# Patient Record
Sex: Female | Born: 1980 | Race: White | Hispanic: No | Marital: Married | Smoking: Never smoker
Health system: Southern US, Community
[De-identification: ages and names within clinical notes are randomized; demographics above are authoritative.]

## PROBLEM LIST (undated history)

## (undated) VITALS — BP 122/87 | HR 80 | Temp 98.4°F | Resp 18 | Ht 59.0 in | Wt 210.0 lb

## (undated) DIAGNOSIS — K219 Gastro-esophageal reflux disease without esophagitis: Secondary | ICD-10-CM

## (undated) DIAGNOSIS — N898 Other specified noninflammatory disorders of vagina: Principal | ICD-10-CM

## (undated) DIAGNOSIS — R350 Frequency of micturition: Principal | ICD-10-CM

## (undated) DIAGNOSIS — I10 Essential (primary) hypertension: Secondary | ICD-10-CM

## (undated) DIAGNOSIS — B9689 Other specified bacterial agents as the cause of diseases classified elsewhere: Secondary | ICD-10-CM

## (undated) DIAGNOSIS — IMO0002 Reserved for concepts with insufficient information to code with codable children: Secondary | ICD-10-CM

## (undated) DIAGNOSIS — M329 Systemic lupus erythematosus, unspecified: Secondary | ICD-10-CM

## (undated) DIAGNOSIS — R51 Headache: Secondary | ICD-10-CM

## (undated) DIAGNOSIS — B379 Candidiasis, unspecified: Secondary | ICD-10-CM

## (undated) DIAGNOSIS — T7840XA Allergy, unspecified, initial encounter: Secondary | ICD-10-CM

## (undated) DIAGNOSIS — F319 Bipolar disorder, unspecified: Secondary | ICD-10-CM

## (undated) DIAGNOSIS — K5792 Diverticulitis of intestine, part unspecified, without perforation or abscess without bleeding: Secondary | ICD-10-CM

## (undated) DIAGNOSIS — E669 Obesity, unspecified: Secondary | ICD-10-CM

## (undated) DIAGNOSIS — M545 Low back pain: Secondary | ICD-10-CM

## (undated) DIAGNOSIS — D649 Anemia, unspecified: Secondary | ICD-10-CM

## (undated) DIAGNOSIS — N760 Acute vaginitis: Secondary | ICD-10-CM

## (undated) DIAGNOSIS — F32A Depression, unspecified: Secondary | ICD-10-CM

## (undated) DIAGNOSIS — F419 Anxiety disorder, unspecified: Secondary | ICD-10-CM

## (undated) DIAGNOSIS — G9600 Cerebrospinal fluid leak, unspecified: Secondary | ICD-10-CM

## (undated) DIAGNOSIS — F329 Major depressive disorder, single episode, unspecified: Secondary | ICD-10-CM

## (undated) HISTORY — DX: Acute vaginitis: N76.0

## (undated) HISTORY — DX: Bipolar disorder, unspecified: F31.9

## (undated) HISTORY — DX: Other specified noninflammatory disorders of vagina: N89.8

## (undated) HISTORY — PX: TUBAL LIGATION: SHX77

## (undated) HISTORY — DX: Reserved for concepts with insufficient information to code with codable children: IMO0002

## (undated) HISTORY — PX: RHINOPLASTY: SUR1284

## (undated) HISTORY — DX: Candidiasis, unspecified: B37.9

## (undated) HISTORY — DX: Depression, unspecified: F32.A

## (undated) HISTORY — DX: Headache: R51

## (undated) HISTORY — DX: Cerebrospinal fluid leak: G96.0

## (undated) HISTORY — DX: Obesity, unspecified: E66.9

## (undated) HISTORY — DX: Other specified bacterial agents as the cause of diseases classified elsewhere: B96.89

## (undated) HISTORY — DX: Anemia, unspecified: D64.9

## (undated) HISTORY — DX: Low back pain: M54.5

## (undated) HISTORY — DX: Essential (primary) hypertension: I10

## (undated) HISTORY — DX: Systemic lupus erythematosus, unspecified: M32.9

## (undated) HISTORY — DX: Frequency of micturition: R35.0

## (undated) HISTORY — DX: Major depressive disorder, single episode, unspecified: F32.9

## (undated) HISTORY — DX: Gastro-esophageal reflux disease without esophagitis: K21.9

## (undated) HISTORY — DX: Allergy, unspecified, initial encounter: T78.40XA

## (undated) HISTORY — DX: Cerebrospinal fluid leak, unspecified: G96.00

## (undated) HISTORY — DX: Anxiety disorder, unspecified: F41.9

---

## 2012-01-27 ENCOUNTER — Ambulatory Visit (INDEPENDENT_AMBULATORY_CARE_PROVIDER_SITE_OTHER): Payer: 59 | Admitting: Psychiatry

## 2012-01-27 ENCOUNTER — Encounter (HOSPITAL_COMMUNITY): Payer: Self-pay | Admitting: Psychiatry

## 2012-01-27 VITALS — Wt 215.0 lb

## 2012-01-27 DIAGNOSIS — F4310 Post-traumatic stress disorder, unspecified: Secondary | ICD-10-CM

## 2012-01-27 DIAGNOSIS — F319 Bipolar disorder, unspecified: Secondary | ICD-10-CM

## 2012-01-27 LAB — CBC WITH DIFFERENTIAL/PLATELET
Basophils Absolute: 0 10*3/uL (ref 0.0–0.1)
Basophils Relative: 0 % (ref 0–1)
Eosinophils Absolute: 0.1 10*3/uL (ref 0.0–0.7)
Eosinophils Relative: 1 % (ref 0–5)
HCT: 36.6 % (ref 36.0–46.0)
Hemoglobin: 11.9 g/dL — ABNORMAL LOW (ref 12.0–15.0)
Lymphocytes Relative: 30 % (ref 12–46)
Lymphs Abs: 1.8 10*3/uL (ref 0.7–4.0)
MCH: 28.5 pg (ref 26.0–34.0)
MCHC: 32.5 g/dL (ref 30.0–36.0)
MCV: 87.8 fL (ref 78.0–100.0)
Monocytes Absolute: 0.6 10*3/uL (ref 0.1–1.0)
Monocytes Relative: 10 % (ref 3–12)
Neutro Abs: 3.7 10*3/uL (ref 1.7–7.7)
Neutrophils Relative %: 59 % (ref 43–77)
Platelets: 229 10*3/uL (ref 150–400)
RBC: 4.17 MIL/uL (ref 3.87–5.11)
RDW: 14 % (ref 11.5–15.5)
WBC: 6.2 10*3/uL (ref 4.0–10.5)

## 2012-01-27 LAB — COMPREHENSIVE METABOLIC PANEL
CO2: 25 mEq/L (ref 19–32)
Creat: 0.66 mg/dL (ref 0.50–1.10)
Glucose, Bld: 83 mg/dL (ref 70–99)
Total Bilirubin: 0.5 mg/dL (ref 0.3–1.2)
Total Protein: 6.8 g/dL (ref 6.0–8.3)

## 2012-01-27 MED ORDER — ALPRAZOLAM 0.5 MG PO TABS: 0.5000 mg | ORAL_TABLET | ORAL | Status: DC | PRN

## 2012-01-27 MED ORDER — LAMOTRIGINE 25 MG PO TABS: ORAL_TABLET | ORAL | Status: DC
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
## 2012-01-31 ENCOUNTER — Ambulatory Visit (HOSPITAL_COMMUNITY): Payer: Self-pay | Admitting: Psychiatry

## 2012-02-22 ENCOUNTER — Ambulatory Visit (HOSPITAL_COMMUNITY): Payer: Self-pay | Admitting: Psychiatry

## 2012-03-02 ENCOUNTER — Ambulatory Visit (INDEPENDENT_AMBULATORY_CARE_PROVIDER_SITE_OTHER): Payer: 59 | Admitting: Psychiatry

## 2012-03-02 ENCOUNTER — Encounter (HOSPITAL_COMMUNITY): Payer: Self-pay | Admitting: Psychiatry

## 2012-03-02 VITALS — Wt 216.0 lb

## 2012-03-02 DIAGNOSIS — F4310 Post-traumatic stress disorder, unspecified: Secondary | ICD-10-CM

## 2012-03-02 DIAGNOSIS — F319 Bipolar disorder, unspecified: Secondary | ICD-10-CM

## 2012-03-02 MED ORDER — LAMOTRIGINE 100 MG PO TABS: 100.0000 mg | ORAL_TABLET | Freq: Every day | ORAL | Status: DC

## 2012-03-02 NOTE — Progress Notes (Signed)
Chief complaint I still feel depressed and having mood swings.  History of present illness Patient's 31 year old Caucasian separated employed female who came for her followup appointment.  Patient was seen I am here to establish my care. I was taking medication for my psychiatrist and recently moved to this area.  History of present illness. Patient is 31 year old Caucasian recently separated home and who is self-referred for seeking treatment. The patient was seeing on March 31 for the first time.  She was started on Lamictal .  She is taking the Lamictal as prescribed and not taking 50 mg.  She denies any side effects including any rash or itching.  Patient continued to endorse significant psychosocial stressors.  Her mother has been sick and hospitalize , she had high cardiac enzymes .  Patient is very concerned about her mother who has multiple health issues including dialysis .  She's also very concerned about her job .  She started orientation however she feels overwhelmed and stressed out.  She continued to endorse difficult relationship with her husband who she has been separated recently.  Patient does not get any support from him.  She's living by herself.  Patient admitted some mood swings anger and agitation however she is sleeping better.  She likes Lamictal as she is tolerating the medication very well.  She also takes Xanax only as needed.  She is sleeping better.  However she endorse irritable mood and some crying spells.  She denies any hallucination or any active or passive suicidal thoughts.  She denies any recent self abusive behavior.  She was scheduled to see therapist however she did not come for the appointment .  She is looking for a therapist in [place] where she lives.   She had blood work done and I review the results which shows high hemoglobin A1c.  Patient has family history of high blood sugar.  Her LFT and kidney tests are normal. Patient also read about bipolar disorder and  believe she has some symptoms of mood lability and impulsive behavior.   Current psychiatric medication. Xanax 0.5 mg as needed. Lamictal 25 mg 2 tablet daily  Past psychiatric history. Patient has history of significant depression and mood swings. She has at least 3 psychiatric admissions in 2000-2003 due to cutting herself and suicidal thinking. In the past. She had tried Geodon, Depakote, Lamictal, Prozac, Abilify, Lexapro, Celexa, Effexor, Paxil, Risperdal, Ativan and Klonopin. She admitted history of anger, severe mood swings, and irritability.  Psychosocial history. Patient was born and lived in [place]. She's been married twice. She has one son from her previous marriage and a daughter from her second marriage. She lives by herself. She has supportive parents who live in [place]. She is recently separated from her husband due to failure of reconciliation. She started her job however she isn't orientation phase.  She is working at [employer].  Medical history. Patient has obesity, and chronic migraine headache. She does not have any recent blood work. She is taking Imitrex as needed for headache.  Her recent work showed hemoglobin A1c is 6 however her liver enzymes and kidney functions are normal.  Family history. Patient endorsed significant history of depression anxiety in her family. Her brother, sister and father has depression and anxiety.  Education background and work history. 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   Alcohol and substance  use history. Patient denies any history of alcohol or substance use. She denies a history of DWI.  Mental status examination. Patient is a obese, female, who is casually dressed. She appearstense, anxious, but maintained a fair eye contact.  She described her mood is tense, depressed and sad and her  affect is mood congruent.  She is relevant in conversation. She denies any active or passive suicidal thinking and homicidal thinking. She denies any auditory or visual hallucination. She denies any tremors or shakes. Her speech is soft but clear and coherent. Her thought process is logical linear and goal-directed. There were no psychotic symptoms present at this time. There were no delusion obsession or psychosis present. She's alert and oriented x3. Her insight judgment and impulse control is fair.  Assessment. Axis I mood disorder NOS, bipolar disorder 1, posttraumatic stress disorder, r/o Major depressive disorder Axis II rule out borderline personality trait. Axis III obesity, chronic headache. Axis IV moderate. Axis V 60-65.  Plan. I discussed for blood results with her.  I encourage her to see her primary care physician for further workup or if she need any medication to lower her blood sugar.  Patient has family history of hyperglycemia.  Patient has appointment next week to see primary care physician.  I have given copies of the blood results.  At this time patient is tolerating her Lamictal however she continued to have mood lability agitation and depression.  I will increase Lamictal 200 mg.  She still has Xanax which she takes only as needed .  I also encouraged her to see therapist for coping and social skills.  We discussed safety plan that in any case if she feels worsening of her symptoms or any time having suicidal thoughts or homicidal thoughts continue to call 911 or go to local emergency room.  I will see her again in 3 weeks.  I also discussed to do regular walking and monitor closely her calorie intake and diet.  Time spent 30 minutes.

## 2012-03-08 ENCOUNTER — Ambulatory Visit (INDEPENDENT_AMBULATORY_CARE_PROVIDER_SITE_OTHER): Payer: 59 | Admitting: Psychiatry

## 2012-03-08 ENCOUNTER — Encounter (HOSPITAL_COMMUNITY): Payer: Self-pay | Admitting: Psychiatry

## 2012-03-08 DIAGNOSIS — F4310 Post-traumatic stress disorder, unspecified: Secondary | ICD-10-CM

## 2012-03-08 DIAGNOSIS — F319 Bipolar disorder, unspecified: Secondary | ICD-10-CM
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
## 2012-03-08 NOTE — Patient Instructions (Signed)
Discussed orally 

## 2012-03-15 ENCOUNTER — Ambulatory Visit (INDEPENDENT_AMBULATORY_CARE_PROVIDER_SITE_OTHER): Payer: 59 | Admitting: Psychiatry

## 2012-03-15 DIAGNOSIS — F319 Bipolar disorder, unspecified: Secondary | ICD-10-CM

## 2012-03-15 DIAGNOSIS — F4310 Post-traumatic stress disorder, unspecified: Secondary | ICD-10-CM

## 2012-03-17 NOTE — Patient Instructions (Signed)
Discussed orally 
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
## 2012-03-22 ENCOUNTER — Ambulatory Visit (HOSPITAL_COMMUNITY): Payer: Self-pay | Admitting: Psychiatry

## 2012-03-23 ENCOUNTER — Encounter (HOSPITAL_COMMUNITY): Payer: Self-pay | Admitting: Psychiatry

## 2012-03-23 ENCOUNTER — Ambulatory Visit (INDEPENDENT_AMBULATORY_CARE_PROVIDER_SITE_OTHER): Payer: 59 | Admitting: Psychiatry

## 2012-03-23 VITALS — Wt 217.0 lb

## 2012-03-23 DIAGNOSIS — F4310 Post-traumatic stress disorder, unspecified: Secondary | ICD-10-CM

## 2012-03-23 DIAGNOSIS — F319 Bipolar disorder, unspecified: Secondary | ICD-10-CM

## 2012-03-23 MED ORDER — ZIPRASIDONE HCL 40 MG PO CAPS: 40.0000 mg | ORAL_CAPSULE | Freq: Every day | ORAL | Status: DC

## 2012-03-23 MED ORDER — LAMOTRIGINE 150 MG PO TABS: 150.0000 mg | ORAL_TABLET | Freq: Every day | ORAL | Status: DC

## 2012-03-23 MED ORDER — ALPRAZOLAM 0.5 MG PO TABS: 0.5000 mg | ORAL_TABLET | Freq: Two times a day (BID) | ORAL | Status: DC

## 2012-03-23 NOTE — Progress Notes (Signed)
Chief complaint I feel paranoia and agitation.    History of present illness Patient is 31 year old Caucasian separated female who came for her followup appointment.  Patient continued to endorse significant anxiety depression and flashback .  She started working however she feel very paranoid and distressed about her chart.  She endorse not comfortable around people and believed people are talking about her.  She admitted having anxiety and panic attack in past week.  She also endorse self abusive behavior by cutting superficially on her leg with lancets however that did not require any treatment or intervention.  Patient admitted under a lot of the stress and recent weeks.  She has limited support from her husband who she recently separated.  Her mother remains very sick and she has a lot of health issues.  Patient admitted getting easily irritable angry and having some mood swings.  She also endorse crying spells , social isolation and anhedonia.  Her she denies any active or passive suicidal thinking but endorse feeling of hopelessness or helplessness.  She's compliant with her medication and reported no side effects.  She remembered taking higher dose of Lamictal in the past.  She is using Xanax at night time for sleep however she felt more anxiety during the day .  She's wondering if she can take Xanax during the day.  She's not drinking or using any illegal substance.  She never ask early refill of her Xanax.   She started seeing therapist in this office for coping skills however she has seen only one time.  She is scheduled to see her primary care physician on June 4 for further workup.  Her daughter shows high hemoglobin A1c.  Current psychiatric medication. Xanax 0.5 mg as needed. Lamictal 100 mg tablet daily  Past psychiatric history. Patient has history of significant depression and mood swings. She has at least 3 psychiatric admissions in 2000-2003 due to cutting herself and suicidal  thinking. In the past. She had tried Geodon, Depakote, Lamictal, Prozac, Abilify, Lexapro, Celexa, Effexor, Paxil, Risperdal, Ativan and Klonopin. She admitted history of anger, severe mood swings, and irritability.  Psychosocial history. Patient was born and lived in [place]. She's been married twice. She has one son from her previous marriage and a daughter from her second marriage. She lives by herself. She has supportive parents who live in [place]. She is recently separated from her husband due to failure of reconciliation. She started her job however she isn't orientation phase.  She is working at [employer].  Medical history. Patient has obesity, and chronic migraine headache. She does not have any recent blood work. She is taking Imitrex as needed for headache.  Her recent work showed hemoglobin A1c is 6 however her liver enzymes and kidney functions are normal.  She is scheduled to see [facility] on June 4.  Family history. Patient endorsed significant history of depression anxiety in her family. Her brother, sister and father has depression and anxiety.  Education background and work history. 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   Alcohol and substance use history. Patient denies any history of alcohol or substance use. She denies a history of DWI.  Mental status examination. Patient is a obese, female, who is casually dressed. She appears tense, anxious, but maintained a fair eye contact.  She described her mood is tense, depressed and sad  and her affect is mood congruent.  She is tearful but relevant in conversation. She denies any active or passive suicidal thinking and homicidal thinking. She denies any auditory or visual hallucination. She denies any tremors or shakes. Her speech is soft but clear and coherent. Her thought  process is logical linear and goal-directed.  She endorse paranoia and believes sometimes people talking about her .  She's alert and oriented x3. Her insight judgment and impulse control is fair.  Assessment. Axis I mood disorder NOS, bipolar disorder 1, posttraumatic stress disorder, r/o Major depressive disorder Axis II rule out borderline personality trait. Axis III obesity, chronic headache. Axis IV moderate. Axis V 60-65.  Plan. I I discussed in detail about her symptoms.  I do believe patient is decompensating slowly.  She will require antipsychotic medication to target the paranoid thinking.  She remembered taking Geodon which cause excessive sedation but she also believed she was taking a very high dose.  She is willing to take 40 mg of Geodon at night to target dose paranoid thinking .  I also increase her Xanax 0.5 mg twice a day to target her residual anxiety.  At this time patient does not want any antidepressant however we will consider adding Wellbutrin which she has not taken in the past if she continues to show anxiety symptoms.  I encourage her to see therapist regularly for coping and social skills.  I also talk about self abusive behavior which  Need and her therapy  to be addressed in therapy.  I explained that she should talk to therapist for other coping skills.  I recommend to call us if she feels worsening of the symptoms or if she having any time suicidal thoughts or homicidal thoughts.  I will see her again in 10 days.  Time spent 30 minutes.  She will see her primary care physician on June 4.  Her hemoglobin A1c was 6.0.  She has gained 2 pounds from the last visit.  She's also concerned about her continue weight gain.

## 2012-03-30 ENCOUNTER — Ambulatory Visit (INDEPENDENT_AMBULATORY_CARE_PROVIDER_SITE_OTHER): Payer: 59 | Admitting: Psychiatry

## 2012-03-30 ENCOUNTER — Telehealth (HOSPITAL_COMMUNITY): Payer: Self-pay | Admitting: *Deleted

## 2012-03-30 ENCOUNTER — Encounter (HOSPITAL_COMMUNITY): Payer: Self-pay | Admitting: Psychiatry

## 2012-03-30 VITALS — Wt 217.0 lb

## 2012-03-30 DIAGNOSIS — F319 Bipolar disorder, unspecified: Secondary | ICD-10-CM

## 2012-03-30 DIAGNOSIS — F4310 Post-traumatic stress disorder, unspecified: Secondary | ICD-10-CM

## 2012-03-30 MED ORDER — ZIPRASIDONE HCL 20 MG PO CAPS: 20.0000 mg | ORAL_CAPSULE | Freq: Every day | ORAL | Status: DC

## 2012-03-30 MED ORDER — ZIPRASIDONE HCL 20 MG PO CAPS: ORAL_CAPSULE | ORAL | Status: DC

## 2012-03-30 MED ORDER — LAMOTRIGINE 200 MG PO TABS: 200.0000 mg | ORAL_TABLET | Freq: Every day | ORAL | Status: DC

## 2012-03-30 NOTE — Progress Notes (Signed)
Addended by: Kathryne Sharper T on: 03/30/2012 02:33 PM   Modules accepted: Orders

## 2012-03-30 NOTE — Progress Notes (Addendum)
Chief complaint I feel remain anxious and paranoid.      History of present illness Patient is 31 year old Caucasian separated female who came for her followup appointment.  On her last appointment we started Geodon to target the psychosis and paranoia.  Patient is tolerating Geodon 40 mg however she continues to have paranoid thinking and hallucination.  She feel very anxious and nervous.  She admitted having crying spells and poor sleep .  She had decided to quit her job as she cannot stay focused .  She admitted that she feels sometimes people are talking about her .  She also endorse can easily irritable angry and having mood swing.  She admitted continue to have self abusive behavior .  She had cut her leg one-time in past 2 weeks however she does not want to show her cutting to other people.  There was no injury or bleeding.  Patient denies any active suicidal thoughts but endorse social isolation, detachment and withdrawn.  She continues to have other stress in her life.  Her mother is still sick and she has limited support from her relatives.  She was to try higher dose of Geodon and she also interested to increase her Lamictal.  In the past she had tried multiple [name] arise antidepressants however she had opposite reaction with these medication.  She denies any violence or aggressively he may. She's not drinking or using any illegal substance.  She never ask early refill of her Xanax.  She started seeing therapist in this office for coping skills however she has seen only one time.  She is scheduled to see her primary care physician on June 4 for further workup.  Her daughter shows high hemoglobin A1c and she is concerned about her.  Patient weight remained stable.  Current psychiatric medication. Xanax 0.5 mg as needed. Lamictal 100 mg tablet daily Geodon 40 mg at bedtime.  Past psychiatric history. Patient has history of significant depression and mood swings. She has at least 3 psychiatric  admissions in 2000-2003 due to cutting herself and suicidal thinking. In the past. She had tried Geodon, Depakote, Lamictal, Prozac, Abilify, Lexapro, Celexa, Effexor, Paxil, Risperdal, Ativan and Klonopin. She admitted history of anger, severe mood swings, and irritability.  Psychosocial history. Patient was born and lived in [place]. She's been married twice. She has one son from her previous marriage and a daughter from her second marriage. She lives by herself. She has supportive parents who live in [place]. She is recently separated from her husband due to failure of reconciliation. She started her job however she isn't orientation phase.  She is working at [employer].  Medical history. Patient has obesity, and chronic migraine headache. She does not have any recent blood work. She is taking Imitrex as needed for headache.  Her recent work showed hemoglobin A1c is 6 however her liver enzymes and kidney functions are normal.  She is scheduled to see [facility] on June 4.  Family history. Patient endorsed significant history of depression anxiety in her family. Her brother, sister and father has depression and anxiety.  Education background and work history. 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   Alcohol and substance use history. Patient denies any history of alcohol or substance use. She denies a history of DWI.  Mental status examination. Patient is a obese, female, who is  casually dressed. She appears tense, anxious, but maintained a fair eye contact.  She described her mood is tense, depressed and sad and her affect is mood congruent.  She is tearful but relevant in conversation. She denies any active or passive suicidal thinking and homicidal thinking. She endorse paranoia and auditory hallucination .  She admitted that sometimes she  hears voices .  She denies any tremors or shakes. Her speech is soft but clear and coherent. Her thought process is logical linear and goal-directed.  She endorse paranoia and believes sometimes people talking about her .  She's alert and oriented x3. Her insight judgment and impulse control is fair.  Assessment. Axis I mood disorder NOS, bipolar disorder 1, posttraumatic stress disorder, r/o Major depressive disorder Axis II rule out borderline personality trait. Axis III obesity, chronic headache. Axis IV moderate. Axis V 60-65.  Plan. I will increase her Geodon to 60 mg and Lamictal to 200 mg.  Patient is tolerating her Lamictal without any side effects.  I also talk about voluntary psychiatric inpatient treatment however patient refused.  Patient does not have criteria for involuntary admission.  I discussed in detail the risks and benefits of medication and reassurance given.  I do believe patient at this time does not able to keep her work.  She realizes that once her psychiatric illness gets better she will consider and rethink to apply again. Patient is scheduled to see therapist on June 4.  I recommend to call us if she feels worsening of her symptoms or any time having active suicidal thoughts.  I will see her again in 2 weeks.  Time spent 30 minutes.

## 2012-03-30 NOTE — Progress Notes (Signed)
Addended by: Kathryne Sharper T on: 03/30/2012 02:01 PM   Modules accepted: Orders

## 2012-04-10 ENCOUNTER — Ambulatory Visit (INDEPENDENT_AMBULATORY_CARE_PROVIDER_SITE_OTHER): Payer: 59 | Admitting: Psychiatry

## 2012-04-10 ENCOUNTER — Telehealth (HOSPITAL_COMMUNITY): Payer: Self-pay | Admitting: *Deleted

## 2012-04-10 DIAGNOSIS — F319 Bipolar disorder, unspecified: Secondary | ICD-10-CM
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
## 2012-04-10 NOTE — Patient Instructions (Signed)
Discussed orally 

## 2012-04-11 ENCOUNTER — Other Ambulatory Visit: Payer: Self-pay | Admitting: Adult Health

## 2012-04-11 ENCOUNTER — Other Ambulatory Visit (HOSPITAL_COMMUNITY)
Admission: RE | Admit: 2012-04-11 | Discharge: 2012-04-11 | Disposition: A | Discharge status: Home / Self Care | Payer: 59 | Source: Ambulatory Visit | Attending: Obstetrics and Gynecology | Admitting: Obstetrics and Gynecology

## 2012-04-11 DIAGNOSIS — Z1159 Encounter for screening for other viral diseases: Secondary | ICD-10-CM | POA: Insufficient documentation

## 2012-04-11 DIAGNOSIS — Z113 Encounter for screening for infections with a predominantly sexual mode of transmission: Secondary | ICD-10-CM | POA: Insufficient documentation

## 2012-04-11 DIAGNOSIS — Z01419 Encounter for gynecological examination (general) (routine) without abnormal findings: Secondary | ICD-10-CM | POA: Insufficient documentation

## 2012-04-13 NOTE — Telephone Encounter (Signed)
We will send our records once we have consent.

## 2012-04-18 ENCOUNTER — Encounter (HOSPITAL_COMMUNITY): Payer: Self-pay | Admitting: Psychiatry

## 2012-04-18 ENCOUNTER — Ambulatory Visit (INDEPENDENT_AMBULATORY_CARE_PROVIDER_SITE_OTHER): Payer: 59 | Admitting: Psychiatry

## 2012-04-18 DIAGNOSIS — F319 Bipolar disorder, unspecified: Secondary | ICD-10-CM

## 2012-04-18 DIAGNOSIS — F4310 Post-traumatic stress disorder, unspecified: Secondary | ICD-10-CM

## 2012-04-18 MED ORDER — ESCITALOPRAM OXALATE 10 MG PO TABS: ORAL_TABLET | ORAL | Status: DC

## 2012-04-18 MED ORDER — LAMOTRIGINE 200 MG PO TABS: 200.0000 mg | ORAL_TABLET | Freq: Every day | ORAL | Status: DC

## 2012-04-18 MED ORDER — ZIPRASIDONE HCL 80 MG PO CAPS: 80.0000 mg | ORAL_CAPSULE | Freq: Every day | ORAL | Status: DC

## 2012-04-18 MED ORDER — ALPRAZOLAM 0.5 MG PO TABS: 0.5000 mg | ORAL_TABLET | Freq: Two times a day (BID) | ORAL | Status: DC

## 2012-04-18 NOTE — Progress Notes (Signed)
Chief complaint I don't see any improvement.  I remain anxious and paranoid.      History of present illness Patient is 31 year old Caucasian separated female who came for her followup appointment.  On her last appointment we increase Geodon to 60 mg.  She felt some improvement in her paranoia however she continued to endorse anxiety symptoms and paranoid thinking.  Patient admitted increased distress in her daily life.  Her mother requires dialysis and multiple Dr. appointment .  Patient admitted recently more crying spells social isolation and decreased energy do anything.  She also noticed poor sleep despite increasing Geodon.  Patient is unable to understand why she cannot sleep a Geodon as in the past she has mentioned that 60 mg of Geodon was too much and give very good sleep.  She admitted having crying spells social isolation and racing thoughts.  She continued to endorse that sometime she feels people are talking about her.  She also endorse easily irritable angry and having mood swing.  She admitted continue to engage in self abusive behavior .  She had cut her leg one-time with a small pin in past 2 weeks while taking shower however she does not bleed and marks were superficial.   Patient denies any active suicidal thoughts but endorse social isolation, detachment and withdrawn.  She consider herself failure since she cannot keep her job at [employer] and decided to resign.  She's not drinking or using any illegal substance.    Current psychiatric medication. Xanax 0.5 mg as needed. Lamictal 100 mg tablet daily Geodon 60 mg at bedtime.  Past psychiatric history. Patient has history of significant depression and mood swings. She has at least 3 psychiatric admissions in 2000-2003 due to cutting herself and suicidal thinking. In the past. She had tried Geodon, Depakote, Lamictal, Prozac, Abilify, Lexapro, Celexa, Effexor, Paxil, Risperdal, Ativan and Klonopin. She admitted history of anger, severe  mood swings, and irritability.  Psychosocial history. Patient was born and lived in [place]. She's been married twice. She has one son from her previous marriage and a daughter from her second marriage. She lives by herself. She has supportive parents who live in [place]. She is recently separated from her husband due to failure of reconciliation. She started her job however she isn't orientation phase.  She is working at [employer].  Medical history. Patient has obesity, and chronic migraine headache. She does not have any recent blood work. She is taking Imitrex as needed for headache.  Her recent work showed hemoglobin A1c is 6 however her liver enzymes and kidney functions are normal.  She is scheduled to see [facility] on June 4.  Family history. Patient endorsed significant history of depression anxiety in her family. Her brother, sister and father has depression and anxiety.  Education background and work history. 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   Alcohol and substance use history. Patient denies any history of alcohol or substance use. She denies a history of DWI.  Mental status examination. Patient is a obese, female, who is casually dressed. She appears tense, anxious, but maintained a fair eye contact.  She described her mood is tense, depressed and sad and her affect is mood congruent.  She is tearful but relevant in conversation. She denies any active or passive suicidal thinking and homicidal thinking. She endorse paranoia and auditory hallucination .  There were no flight of ideas or loose association .  She's alert and oriented x3. Her insight judgment and impulse control is fair.  Assessment. Axis I mood disorder NOS, bipolar disorder 1, posttraumatic stress disorder, r/o Major depressive disorder Axis II rule out  borderline personality trait. Axis III obesity, chronic headache. Axis IV moderate. Axis V 60-65.  Plan. I do believe patient has some depressive symptoms and require antidepressant..  She has been diagnosed in the past with borderline personality disorder and posttraumatic disorder.  I recommend to try a small dose Lexapro which she had tried only for 3 weeks in the past and do not remember the response.  I will also increase the Geodon to 80 mg.  We also talked about lithium however patient do not remember taking lithium in the past.  We will defer lithium for now.  I also recommend to consider DBT her borderline disorder.  Patient will discuss with her therapist for DBT treatment .  One more time we discuss about the safety plan that anytime if she having active suicidal thoughts or homicidal thoughts and she need to call 911 or go to local emergency room.  I will see her again in 2-3 weeks.  Time spent 30 minutes.    Portion of this note is generated with voice recognition software and may contain typographical error.

## 2012-04-19 ENCOUNTER — Ambulatory Visit (HOSPITAL_COMMUNITY): Payer: Self-pay | Admitting: Psychiatry

## 2012-04-27 ENCOUNTER — Telehealth (HOSPITAL_COMMUNITY): Payer: Self-pay | Admitting: *Deleted

## 2012-04-28 ENCOUNTER — Encounter (HOSPITAL_COMMUNITY): Payer: Self-pay

## 2012-04-28 ENCOUNTER — Inpatient Hospital Stay (HOSPITAL_COMMUNITY)
Admission: RE | Admit: 2012-04-28 | Discharge: 2012-05-01 | DRG: 885 | Disposition: A | Discharge status: Home / Self Care | Payer: 59 | Attending: Psychiatry | Admitting: Psychiatry

## 2012-04-28 DIAGNOSIS — Z79899 Other long term (current) drug therapy: Secondary | ICD-10-CM

## 2012-04-28 DIAGNOSIS — S71109A Unspecified open wound, unspecified thigh, initial encounter: Secondary | ICD-10-CM | POA: Diagnosis present

## 2012-04-28 DIAGNOSIS — F4310 Post-traumatic stress disorder, unspecified: Secondary | ICD-10-CM | POA: Diagnosis present

## 2012-04-28 DIAGNOSIS — R51 Headache: Secondary | ICD-10-CM | POA: Diagnosis present

## 2012-04-28 DIAGNOSIS — F3130 Bipolar disorder, current episode depressed, mild or moderate severity, unspecified: Principal | ICD-10-CM | POA: Diagnosis present

## 2012-04-28 DIAGNOSIS — X789XXA Intentional self-harm by unspecified sharp object, initial encounter: Secondary | ICD-10-CM | POA: Diagnosis present

## 2012-04-28 DIAGNOSIS — F319 Bipolar disorder, unspecified: Secondary | ICD-10-CM | POA: Diagnosis present

## 2012-04-28 DIAGNOSIS — S71009A Unspecified open wound, unspecified hip, initial encounter: Secondary | ICD-10-CM | POA: Diagnosis present

## 2012-04-28 MED ORDER — LAMOTRIGINE 100 MG PO TABS
200.0000 mg | ORAL_TABLET | Freq: Every day | ORAL | Status: DC
  Administered 2012-04-29 – 2012-05-01 (×3): 200 mg via ORAL
  Filled 2012-04-28 (×4): qty 1

## 2012-04-28 MED ORDER — ALPRAZOLAM 0.5 MG PO TABS
0.5000 mg | ORAL_TABLET | Freq: Two times a day (BID) | ORAL | Status: DC
  Administered 2012-04-28 – 2012-04-29 (×2): 0.5 mg via ORAL
  Filled 2012-04-28 (×2): qty 1

## 2012-04-28 MED ORDER — ZIPRASIDONE HCL 80 MG PO CAPS
80.0000 mg | ORAL_CAPSULE | Freq: Every day | ORAL | Status: DC
  Administered 2012-04-28 – 2012-04-30 (×3): 80 mg via ORAL
  Filled 2012-04-28 (×5): qty 1

## 2012-04-28 MED ORDER — ACETAMINOPHEN 325 MG PO TABS
650.0000 mg | ORAL_TABLET | Freq: Four times a day (QID) | ORAL | Status: DC | PRN
  Administered 2012-04-28 – 2012-05-01 (×2): 650 mg via ORAL

## 2012-04-28 MED ORDER — MAGNESIUM HYDROXIDE 400 MG/5ML PO SUSP: 30.0000 mL | Freq: Every day | ORAL | Status: DC | PRN

## 2012-04-28 MED ORDER — ESCITALOPRAM OXALATE 10 MG PO TABS
10.0000 mg | ORAL_TABLET | Freq: Every day | ORAL | Status: DC
  Administered 2012-04-29 – 2012-04-30 (×2): 10 mg via ORAL
  Filled 2012-04-28 (×3): qty 1

## 2012-04-28 MED ORDER — ALUM & MAG HYDROXIDE-SIMETH 200-200-20 MG/5ML PO SUSP: 30.0000 mL | ORAL | Status: DC | PRN
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
## 2012-04-28 NOTE — Progress Notes (Signed)
Patient ID: Teresa Tran, female   DOB: 01/07/1981, 31 y.o.   MRN: [id number]  Patient was oriented to unit upon completion of admission. Orders received from Dr.Arfeen and patient was medicated. Patient received her medications and returned to her room to rest. Patient denies si/hi/a/v hall. Patient contracts for safety and reports that she will let staff know if she feels the urge to cut. Safety maintained on unit, will continue to monitor.
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
## 2012-04-28 NOTE — Tx Team (Signed)
Initial Interdisciplinary Treatment Plan  PATIENT STRENGTHS: (choose at least two) Ability for insight Motivation for treatment/growth  PATIENT STRESSORS: Financial difficulties Legal issue   PROBLEM LIST: Problem List/Patient Goals Date to be addressed Date deferred Reason deferred Estimated date of resolution  Racing thoughts 04/28/2012     Anxiety      Depression      Paranoia      Decline in functioning on daily basis      Coping skills for cutting      Mood swings                   DISCHARGE CRITERIA:  Ability to meet basic life and health needs Adequate post-discharge living arrangements Improved stabilization in mood, thinking, and/or behavior Motivation to continue treatment in a less acute level of care Need for constant or close observation no longer present Reduction of life-threatening or endangering symptoms to within safe limits Safe-care adequate arrangements made Verbal commitment to aftercare and medication compliance  PRELIMINARY DISCHARGE PLAN: Return to previous living arrangement  PATIENT/FAMIILY INVOLVEMENT: This treatment plan has been presented to and reviewed with the patient, Renia Mikelson, and/or family member.  The patient and family have been given the opportunity to ask questions and make suggestions.  Floyce Stakes 04/28/2012, 8:26 PM

## 2012-04-29 MED ORDER — CLONAZEPAM 0.125 MG PO TBDP: 0.1250 mg | ORAL_TABLET | Freq: Two times a day (BID) | ORAL | Status: DC

## 2012-04-29 MED ORDER — ALPRAZOLAM 0.5 MG PO TABS
0.5000 mg | ORAL_TABLET | Freq: Three times a day (TID) | ORAL | Status: DC | PRN
  Administered 2012-04-29 – 2012-05-01 (×7): 0.5 mg via ORAL
  Filled 2012-04-29 (×7): qty 1

## 2012-04-29 NOTE — Progress Notes (Signed)
04-29-12  NSG NOTE  7a-7p  D: Affect is blunted, but brightens on approach.  Mood is depressed.  Behavior is appropriate with encouragement, direction and support.  Interacts appropriately with peers and staff.  Participated in group, counselor lead group, and recreation.  Stated that she is sleeping fair, her appetite is improving, but reports a low energy level.  Continues to have passive thoughts of cutting herself.   Reported increasing anxiety in afternoons med order obtained.   A:  Medications per MD order.  Support given throughout day.  1:1 time spent with pt.  R:  Following treatment plan.  Reports passive SI, (thoughts of cutting herself, Contracts). Denies HI, auditory or visual hallucinations.  Contracts for safety.

## 2012-04-29 NOTE — Progress Notes (Signed)
[facility] Group Notes:  (Counselor/Nursing/MHT/Case Management/Adjunct)  04/29/2012 11:30 AM  Type of Therapy:  After care Planning group  Pt. participated in after care planning group.  The pt. was given [facility] SI pamphlet and crisis hot line numbers and pt.'s in the group agreed to use them if needed. Pt. Was also given information about [facility]  And given their crisis mobile number. The pt.'s in the group agreed to use it if needed. Pt. Spoke about having Bipolar and her symptoms getting worse. Pt. Spoke about her cutting herself on the legs prior to coming to [facility]. Pt. Reports a history of cutting. The pt stated that Dr. Lolly Mustache is her doctor and that she sees him at his [employer]. Pt. States that Dr. Lolly Mustache called and had her admitted to [facility]. Pt. Currently denies SI or HI today.   Lamar Blinks [place] 04/29/2012, 11:30 AM
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
## 2012-04-29 NOTE — H&P (Signed)
Teresa Tran is an 31 y.o. female.   Chief Complaint:  Paranoia & cutting  HPI: Symptoms have escalted since last visit with Dr.Arfeen June 11th.  For further details please review his outpatient note June 11th.  Past Medical History  Diagnosis Date  . Headache     Past Surgical History  Procedure Date  . Tubal ligation     Family History  Problem Relation Age of Onset  . Depression Father   . Depression Sister   . Depression Brother    Social History:  reports that she has never smoked. She has never used smokeless tobacco. She reports that she does not drink alcohol or use illicit drugs.  Allergies: No Known Allergies  Medications Prior to Admission  Medication Sig Dispense Refill  . ALPRAZolam (XANAX) 0.5 MG tablet Take 1 tablet (0.5 mg total) by mouth 2 (two) times daily.  60 tablet  0  . escitalopram (LEXAPRO) 10 MG tablet Take 1/2 tab for 1 week and than 1 tab daily  30 tablet  0  . lamoTRIgine (LAMICTAL) 200 MG tablet Take 1 tablet (200 mg total) by mouth daily.  30 tablet  0  . ziprasidone (GEODON) 80 MG capsule Take 1 capsule (80 mg total) by mouth daily.  30 capsule  0    No results found for this or any previous visit (from the past 48 hour(s)). No results found.  Review of Systems  Constitutional: Negative.   HENT:       Treated for migraines   Eyes: Negative.   Respiratory: Negative.   Cardiovascular: Negative.   Gastrointestinal: Negative.   Genitourinary: Negative.   Musculoskeletal: Negative.   Skin: Negative.   Neurological: Positive for headaches.  Endo/Heme/Allergies: Negative.   Psychiatric/Behavioral: Positive for depression and suicidal ideas. The patient is nervous/anxious.     Blood pressure 105/70, pulse 77, temperature 97.3 F (36.3 C), temperature source Oral, resp. rate 16, height 4\' 11"  (1.499 m), weight 95.255 kg (210 lb). Physical Exam  Constitutional: She is oriented to person, place, and time. She appears well-developed and  well-nourished.       Obese   HENT:  Head: Normocephalic and atraumatic.  Right Ear: External ear normal.  Left Ear: External ear normal.  Nose: Nose normal.  Mouth/Throat: Oropharynx is clear and moist.  Eyes: Conjunctivae and EOM are normal. Pupils are equal, round, and reactive to light.  Neck: Normal range of motion. Neck supple.  Cardiovascular: Normal rate, regular rhythm and normal heart sounds.   Respiratory: Effort normal and breath sounds normal.  GI: Soft. Bowel sounds are normal.  Genitourinary:       Is current with private provider   Musculoskeletal: Normal range of motion.  Neurological: She is alert and oriented to person, place, and time. She has normal reflexes.  Skin: Skin is warm and dry.       Numerous self inflicted superficial lacerations tops of thighs  Psychiatric: She has a normal mood and affect. Her behavior is normal. Thought content normal.     Assessment/Plan Admit for safety& stabilization  Adjust meds    Lillyanne Bradburn,MICKIE D. 04/29/2012, 4:22 PM

## 2012-04-29 NOTE — Progress Notes (Signed)
Writer observed patient in the hallway using the phone and was tearful as she passed the medication window. Writer spoke with patient and she reported that she had tried to call home and got no answer. She is missing her children and wanted to talk to them. Patient was encouraged to try later and reminded her how it would be difficult to be away from her family but to remember that she needed to focus on her getting well. Writer inquired as to how her day had been and she reported that it was ok. Patient reported that she still has passive si and verbally contracts. Writer informed patient of her medication change for xanax and she was pleased to know that she can receive this TID instead of BID. Patient denies hi/a/v hall. Support and encouragement offered. Safety maintained, will continue to monitor.
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
## 2012-04-29 NOTE — Progress Notes (Signed)
[facility] Group Notes:  (Counselor/Nursing/MHT/Case Management/Adjunct)  04/29/2012 4:05 PM Type of Therapy:  Group Therapy  Participation Level:  Active  Participation Quality:  Appropriate and Attentive  Affect:  Appropriate  Cognitive:  Appropriate  Insight:  Good  Engagement in Group:  Good  Engagement in Therapy:  Good  Modes of Intervention:  Clarification, Education, Socialization and Support  Summary of Progress/Problems: Pt. participated in group on self sabotaging behaviors and what that word means to the, what they do specifically that is self sabotaging from themselves, and the changes that they can make in order to make the changes that they need to make. Pt. spoke about self sabotaging meaning to her "cutting" and the pt. Shared with the group about  Urges to cut and self harm Pt. Stated she would take objects to cut with just in case she had the urge to cut, and how this was a specific self sabotaging behavior. Pt. Was encourage to stop cutting and to hid or remove all items she has that she use to self cut/self harm.   Teresa Tran [place] 04/29/2012, 4:05 PM

## 2012-04-30 DIAGNOSIS — F4310 Post-traumatic stress disorder, unspecified: Secondary | ICD-10-CM

## 2012-04-30 MED ORDER — ESCITALOPRAM OXALATE 10 MG PO TABS
10.0000 mg | ORAL_TABLET | Freq: Once | ORAL | Status: AC
  Administered 2012-04-30: 10 mg via ORAL
  Filled 2012-04-30: qty 1

## 2012-04-30 MED ORDER — ESCITALOPRAM OXALATE 20 MG PO TABS
20.0000 mg | ORAL_TABLET | Freq: Every day | ORAL | Status: DC
  Administered 2012-05-01: 20 mg via ORAL
  Filled 2012-04-30 (×2): qty 1

## 2012-04-30 NOTE — Progress Notes (Signed)
D: Pt denies SI/HI/AV. Pt is pleasant and cooperative. Pt rates depression at a 5 and Helplessness/hopelessness at a 2. Pt wanted to know about when she would be discharged. Pt has had some complaints of anxiety but has not rated it.  A: Pt was offered support and encouragement. Pt was given schedule medications along with medicine for her anxiety. Pt was encourage to attend groups. Pt was redirected to speak with her doctor about her discharge.Q 15 minute checks were done for safety.  R:Pt attends groups and interacts well with peers and staff. Pt is taking medication and feels that her anxiety medication is working. Pt did speak with the doctor but will not be leaving today. Pt has no complaints.Pt receptive to treatment and safety maintained on unit.

## 2012-04-30 NOTE — Progress Notes (Signed)
Writer observed patient sitting in her room journaling. Patient reports that she has had a good day, spoke with her husband and kids on the phone today and she realizes that she want to get better in order to take care of her children. Patient currently denies having pain, denies having self harm thoughts and si/hi/a/v hall. Night medication were discussed and she requested xanax with her geodon. Support and encouragement offered, safety maintained on unit, will continue to monitor.

## 2012-04-30 NOTE — Progress Notes (Signed)
[facility] Adult Inpatient Family/Significant Other Suicide Prevention Education  Suicide Prevention Education:  Education Completed; Teresa Tran-[phone]-Pt.'s husband- has been identified by the patient as the family member/significant other with whom the patient will be residing, and identified as the person(s) who will aid the patient in the event of a mental health crisis (suicidal ideations/suicide attempt).  With written consent from the patient, the family member/significant other has been provided the following suicide prevention education, prior to the and/or following the discharge of the patient.  The suicide prevention education provided includes the following:  Suicide risk factors  Suicide prevention and interventions  National Suicide Hotline telephone number  [facility] assessment telephone number  [facility] Emergency Assistance 911  [facility] and/or Residential Mobile Crisis Unit telephone number  Request made of family/significant other to:  Remove weapons (e.g., guns, rifles, knives), all items previously/currently identified as safety concern.  Pt.'s husband states there are no guns or weapons in the home.  Remove drugs/medications (over-the-counter, prescriptions, illicit drugs), all items previously/currently identified as a safety concern. Pt.'s husband had no questions or concerns about medication and states the pt. Has no problems with taking her medications. The pt.'s husband will secure the home before the pt. is discharged.  Pt. states that the pt. has had problems in the past with SI thoughts but has had no prior attempts. The pt.'s husband had no other question and stated he would pick the pt. Up at d/c unless he is working. Someone else would have to pick the pt. up if the pt.'s husband is working.  The family member/significant other verbalizes understanding of the suicide prevention education information provided.  The family  member/significant other agrees to remove the items of safety concern listed above.  Lamar Blinks [place] 04/30/2012, 11:29 AM

## 2012-04-30 NOTE — Progress Notes (Signed)
[facility] Group Notes:  (Counselor/Nursing/MHT/Case Management/Adjunct)  04/30/2012 5:56 PM  Type of Therapy:  Group Therapy  Participation Level:  Active  Participation Quality:  Appropriate and Attentive  Affect:  Appropriate  Cognitive:  Appropriate  Insight:  Good  Engagement in Group:  Good  Engagement in Therapy:  Good  Modes of Intervention:  Clarification, Education, Socialization and Support  Summary of Progress/Problems: Pt. participated in group on supports and what support means to them, who they have as supports in their lives, and how to seek support if they do not have supports in their life. Pt. Spoke about her mother being a support to her and how she prays for her.   Teresa Tran [place] 04/30/2012, 5:56 PM

## 2012-04-30 NOTE — Progress Notes (Signed)
[facility] MD Progress Note  04/30/2012 11:24 AM  Diagnosis:  Axis I: Bipolar, Depressed and Post Traumatic Stress Disorder  ADL's:  Intact  Sleep: Poor  Appetite:  Fair  Suicidal Ideation:  Patient has a been thinking about self injurious behaviors and acting on cutting on her upper thighs, but denies heat as a suicidal attempt. Homicidal Ideation:  Patient has denied homicidal ideations, intentions and plans  AEB (as evidenced by): Patient appeared as per her stated age, and anxious about hospital stay and not being at home with her children. Patient has been focusing on disposition plans. She want to continue seeing Dr. Lolly Mustache had to [facility] outpatient psychiatric services up upon the discharge. Patient requested to increase her medication Lexapro for better control of her mood and anxiety.  Mental Status Examination/Evaluation: Objective:  Appearance: Casual, Fairly Groomed and Guarded  Patent attorney::  Fair  Speech:  Clear and Coherent and Normal Rate  Volume:  Normal  Mood:  Anxious and Depressed  Affect:  Appropriate and Congruent  Thought Process:  Coherent and Goal Directed  Orientation:  Full  Thought Content:  WDL  Suicidal Thoughts:  Yes.  without intent/plan  Homicidal Thoughts:  No  Memory:  Immediate;   Fair  Judgement:  Impaired  Insight:  Lacking  Psychomotor Activity:  Increased  Concentration:  Poor  Recall:  Fair  Akathisia:  No  Handed:  Right  AIMS (if indicated):     Assets:  Communication Skills Desire for Improvement Intimacy Leisure Time Physical Health Social Support  Sleep:  Number of Hours: 6    Vital Signs:Blood pressure 131/93, pulse 83, temperature 96.7 F (35.9 C), temperature source Oral, resp. rate 16, height 4\' 11"  (1.499 m), weight 210 lb (95.255 kg). Current Medications: Current Facility-Administered Medications  Medication Dose Route Frequency Provider Last Rate Last Dose  . acetaminophen (TYLENOL) tablet 650 mg  650 mg Oral Q6H PRN  Cleotis Nipper, MD   650 mg at 04/28/12 2131  . ALPRAZolam (XANAX) tablet 0.5 mg  0.5 mg Oral TID PRN Mickie D. Adams, PA   0.5 mg at 04/30/12 0748  . alum & mag hydroxide-simeth (MAALOX/MYLANTA) 200-200-20 MG/5ML suspension 30 mL  30 mL Oral Q4H PRN Cleotis Nipper, MD      . escitalopram (LEXAPRO) tablet 10 mg  10 mg Oral ONCE-1800 Nehemiah Settle, MD      . escitalopram (LEXAPRO) tablet 20 mg  20 mg Oral Daily Nehemiah Settle, MD      . lamoTRIgine (LAMICTAL) tablet 200 mg  200 mg Oral Daily Cleotis Nipper, MD   200 mg at 04/30/12 0746  . magnesium hydroxide (MILK OF MAGNESIA) suspension 30 mL  30 mL Oral Daily PRN Cleotis Nipper, MD      . ziprasidone (GEODON) capsule 80 mg  80 mg Oral QHS Cleotis Nipper, MD   80 mg at 04/29/12 2216  . DISCONTD: ALPRAZolam Prudy Feeler) tablet 0.5 mg  0.5 mg Oral BID Cleotis Nipper, MD   0.5 mg at 04/29/12 0754  . DISCONTD: clonazepam (KLONOPIN) disintegrating tablet 0.125 mg  0.125 mg Oral BID Nehemiah Settle, MD      . DISCONTD: escitalopram (LEXAPRO) tablet 10 mg  10 mg Oral Daily Cleotis Nipper, MD   10 mg at 04/30/12 0747    Lab Results: No results found for this or any previous visit (from the past 48 hour(s)).  Physical Findings: AIMS:  , ,  ,  ,  CIWA:    COWS:     Treatment Plan Summary: Daily contact with patient to assess and evaluate symptoms and progress in treatment Medication management  Plan: Increase Lexapro to 20 mg daily and add 10 mg at 6 PM today as one-time dose. Patient will continue her rest of the medication and the part respect actively in unit activities including groups. Patient wants to discuss with her primary team about disposition plan as a less tomorrow. Patient was informed she will discussed with the case manager, and Dr. walker, tomorrow morning  [name],[name] 04/30/2012, 11:24 AM

## 2012-04-30 NOTE — Progress Notes (Signed)
[facility] Group Notes:  (Counselor/Nursing/MHT/Case Management/Adjunct)  04/30/2012 5:32 PM  Type of Therapy:  After Care Planning group  Pt.  participated in after care planning group and was given [place] SI information along with crisis  and hotline numbers . Each pt. Agreed to use them if needed. Pt. Pt. Was also given information about the [facility] and a list of free support groups located in [place] Pt. Stated she slept pretty good and that she felt okay. Pt. Spoke about realizing the importance of her children and family since being at [facility]. Pt. Saw doctor yesterday and no changes were made to medications.  Lamar Blinks [place] 04/30/2012, 5:32 PM

## 2012-05-01 DIAGNOSIS — F3130 Bipolar disorder, current episode depressed, mild or moderate severity, unspecified: Principal | ICD-10-CM

## 2012-05-01 MED ORDER — ZIPRASIDONE HCL 80 MG PO CAPS: 80.0000 mg | ORAL_CAPSULE | Freq: Every day | ORAL | Status: DC

## 2012-05-01 MED ORDER — ALPRAZOLAM 0.5 MG PO TABS: 0.5000 mg | ORAL_TABLET | ORAL | Status: DC

## 2012-05-01 MED ORDER — LAMOTRIGINE 200 MG PO TABS: 200.0000 mg | ORAL_TABLET | Freq: Every day | ORAL | Status: DC

## 2012-05-01 MED ORDER — ALPRAZOLAM 0.5 MG PO TABS: 0.5000 mg | ORAL_TABLET | ORAL | Status: AC

## 2012-05-01 MED ORDER — ESCITALOPRAM OXALATE 20 MG PO TABS: 20.0000 mg | ORAL_TABLET | Freq: Every day | ORAL | Status: DC

## 2012-05-01 NOTE — [facility] Counselor (Signed)
Psychoeducational Group Note  Date:  05/01/2012 Time:  11am  Group Topic/Focus:  Dimensions of Wellness:   The focus of this group is to introduce the topic of wellness and discuss the role each dimension of wellness plays in total health.  Participation Level:  Active  Participation Quality:  Appropriate and sharing  Affect:  Appropriate  Cognitive:  Alert and Appropriate  Insight:  Good  Engagement in Group:  Good  Additional Comments:  Teresa Tran was engaged in group and contributed a lot to the discussion. She said that she would like to focus on her physical and emotional wellness. She reported having a great support system. She said her mother is someone she can always talk to and that her husband is supportive. Her brother makes her laugh and feel "light". She reported that she is very unhappy about her weight and would like to go back to being 120 pounds like she was before.   Christy Sartorius. 05/01/2012, 12:19 PM

## 2012-05-01 NOTE — Progress Notes (Signed)
Teresa Tran:  Will you be returning to the same living situation after discharge: Yes,  Patient will return to her home At discharge, do you have transportation home?:Yes,  Patient to arrange her own transportation home Do you have the ability to pay for your medications:Yes,  Patient can afford medications  Interagency Information:     Release of information consent forms completed and in the chart;  Patient's signature needed at discharge.  Patient to Follow up at:  Follow-up Information    Follow up with Florencia Reasons - [facility] on 05/03/2012. (You are scheduled with Peggy at 9:45 on Wednesday, May 03, 2012)    Contact information:   [address] [place], [place]  [address]  [phone]      Follow up with Dr. Lorella Nimrod - [facility]  - [place], [place]  [address] on 05/16/2012.          Appointment with Dr. Lolly Mustache is scheduled for 9:45 AM on Tuesday, May 16, 2012 Patient denies SI/HI:   Yes,  Patient is no longer endorsing SI/or other thoughts of self harm    Safety Planning and Suicide Prevention discussed:  Yes,  Reviewed during discharge planning group  Barrier to discharge identified:No.  Summary and Recommendations: Patient encourage to be compliant with medications and follow up with outpatient recommedations    Teresa Tran, Teresa Tran July 05/01/2012, 1:53 PM

## 2012-05-01 NOTE — Progress Notes (Signed)
Patient discharged home per MD order.  Patient will follow up with outpatient services and Dr. Lolly Mustache.  Patient is vested in her treatment.  She denies any SI/HI/AVH.  Patient received all her belongings, medications and prescriptions.  She left ambulatory with her husband.
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
## 2012-05-01 NOTE — Progress Notes (Signed)
D: Pt denies SI/HI/AV. Pt is pleasant and cooperative. Pt rates depression at a 4, anxiety at a 5 and Helplessness/hopelessness at a 1. Pt did express concern about her anxiety and asked for some medication to help her with it.  A: Pt was offered support and encouragement. Pt was given scheduled medications along with prn medication for anxiety. Pt was encourage to attend groups. Q 15 minute checks were done for safety.  R:Pt attends groups and interacts well with peers and staff. Pt is taking medication. Pt receptive to treatment and safety maintained on unit.

## 2012-05-01 NOTE — Progress Notes (Signed)
[facility] Group Notes:  (Counselor/Nursing/MHT/Case Management/Adjunct)  05/01/2012 12:10 AM  Type of Therapy:  Psychoeducational Skills  Participation Level:  Active  Participation Quality:  Appropriate  Affect:  Flat  Cognitive:  Appropriate  Insight:  Good  Engagement in Group:  Good  Engagement in Therapy:  Good  Modes of Intervention:  Education  Summary of Progress/Problems:   Westly Pam 05/01/2012, 12:10 AM

## 2012-05-02 ENCOUNTER — Ambulatory Visit (HOSPITAL_COMMUNITY): Payer: Self-pay | Admitting: Psychiatry

## 2012-05-02 NOTE — Progress Notes (Signed)
Patient Discharge Instructions:  After Visit Summary (AVS):   Access to EMR:  05/02/2012 Psychiatric Admission Assessment Note:   Access to EMR:  05/02/2012 Suicide Risk Assessment - Discharge Assessment:   Access to EMR:  05/02/2012 Next Level Care Provider Has Access to the EMR, 05/02/2012  Records provided to [facility], Dr. Lolly Mustache via [facility]/Epic access.  Wandra Scot, 05/02/2012, 11:43 AM

## 2012-05-03 ENCOUNTER — Ambulatory Visit (INDEPENDENT_AMBULATORY_CARE_PROVIDER_SITE_OTHER): Payer: 59 | Admitting: Psychiatry

## 2012-05-03 DIAGNOSIS — F319 Bipolar disorder, unspecified: Secondary | ICD-10-CM

## 2012-05-03 NOTE — Patient Instructions (Signed)
Discussed orally 
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
## 2012-05-14 NOTE — Discharge Summary (Signed)
Physician Discharge Summary Note  Patient:  Teresa Tran is an 31 y.o., female MRN:  [id number] DOB:  16-Jan-1981 Patient phone:  [phone] (home)  Patient address:   [address] [place] [address],   Date of Admission:  04/28/2012 Date of Discharge: 05/01/2012  Reason for Admission: Depression with suicidal ideation  Discharge Diagnoses: Active Problems:  Bipolar 1 disorder  PTSD (post-traumatic stress disorder)   Axis Diagnosis:  Discharge Diagnoses:  AXIS I: Bipolar I Disorder - Most Recent Episode - Depressed.  AXIS II: Deferred.  AXIS III: 1. Frequent Headaches.  AXIS IV: Chronic Mental Illness. Unemployment. Corporate treasurer.  AXIS V: GAF at time of admission approximately 45. GAF at time of discharge approximately 60.    Level of Care:  OP  Hospital Course:  Teresa Tran was admitted for increasing depression with suicidal ideation.  She was evaluated and treated with Lexapro 20mg , Lamictal 200mg , Geodon 80mg , Alprazolam for anxiety. Her progress was monitored by daily evaluations with clinical providers and a self inventory addressing her mental and emotional status.  Teresa Tran completed those evaluations and turned them in to the nursing staff.  She also attended group sessions addressing coping skills, depression, and relaxation therapy.  Teresa Tran was also followed by a Case manager and a clinical social worker for therapeutic counseling.  On the day of discharge Teresa Tran was in much improved condition and was medically stable.   Consults:  None  Significant Diagnostic Studies:  labs:   Discharge Vitals:   Blood pressure 122/87, pulse 80, temperature 98.4 F (36.9 C), temperature source Oral, resp. rate 18, height 4\' 11"  (1.499 m), weight 95.255 kg (210 lb).  Mental Status Exam: See Mental Status Examination and Suicide Risk Assessment completed by Attending Physician prior to discharge.  Discharge destination:  Home Is patient on multiple antipsychotic therapies at discharge:  No     Has Patient had three or more failed trials of antipsychotic monotherapy by history:  No Recommended Plan for Multiple Antipsychotic Therapies: not applicable    Discharge Orders    Future Orders Please Complete By Expires   Diet - low sodium heart healthy      Increase activity slowly      Discharge instructions      Comments:   Please take all medications only as directed and keep all scheduled follow up appointments.     Medication List  As of 05/14/2012  1:45 PM   TAKE these medications      Indication    ALPRAZolam 0.5 MG tablet   Commonly known as: XANAX   Take 1 tablet (0.5 mg total) by mouth 3 (three) times daily at 8am, 2pm and bedtime. For anxiety.       escitalopram 20 MG tablet   Commonly known as: LEXAPRO   Take 1 tablet (20 mg total) by mouth daily with breakfast. For depression and anxiety.       lamoTRIgine 200 MG tablet   Commonly known as: LAMICTAL   Take 1 tablet (200 mg total) by mouth daily. For mood stabilization.       ziprasidone 80 MG capsule   Commonly known as: GEODON   Take 1 capsule (80 mg total) by mouth at bedtime. For mood stabilization.            Follow-up Information    Follow up with Florencia Reasons - [facility] on 05/03/2012. (You are scheduled with Peggy at 9:45 on Wednesday, May 03, 2012)    Contact information:  [address] [place], [place]  [address]  [phone]      Follow up with Dr. Lolly Mustache - [facility] on 05/16/2012. (You are scheudled with Dr. Lolly Mustache on Tuesday, May 16, 2012 at 9:45 AM)    Contact information:   [phone]         Follow-up recommendations: Eat a heart healthy diet.  Exercise daily as tolerated. Get plenty of rest.    Comments:    Signed: Lloyd Huger T. Aemon Koeller PAC For Dr. Harvie Heck D. Readling 05/14/2012, 1:45 PM

## 2012-05-15 ENCOUNTER — Ambulatory Visit (INDEPENDENT_AMBULATORY_CARE_PROVIDER_SITE_OTHER): Payer: 59 | Admitting: Psychiatry

## 2012-05-15 DIAGNOSIS — F319 Bipolar disorder, unspecified: Secondary | ICD-10-CM

## 2012-05-16 ENCOUNTER — Ambulatory Visit (INDEPENDENT_AMBULATORY_CARE_PROVIDER_SITE_OTHER): Payer: 59 | Admitting: Psychiatry

## 2012-05-16 ENCOUNTER — Encounter (HOSPITAL_COMMUNITY): Payer: Self-pay | Admitting: Psychiatry

## 2012-05-16 VITALS — Wt 201.0 lb

## 2012-05-16 DIAGNOSIS — F39 Unspecified mood [affective] disorder: Secondary | ICD-10-CM

## 2012-05-16 DIAGNOSIS — F319 Bipolar disorder, unspecified: Secondary | ICD-10-CM

## 2012-05-16 DIAGNOSIS — F329 Major depressive disorder, single episode, unspecified: Secondary | ICD-10-CM

## 2012-05-16 DIAGNOSIS — F4310 Post-traumatic stress disorder, unspecified: Secondary | ICD-10-CM

## 2012-05-16 MED ORDER — ZIPRASIDONE HCL 20 MG PO CAPS: 20.0000 mg | ORAL_CAPSULE | Freq: Two times a day (BID) | ORAL | Status: DC

## 2012-05-16 MED ORDER — LAMOTRIGINE 25 MG PO TABS: ORAL_TABLET | ORAL | Status: DC

## 2012-05-16 NOTE — Patient Instructions (Signed)
Discussed orally 

## 2012-05-16 NOTE — Progress Notes (Signed)
Chief complaint I am doing better since her release from the hospital.    History of present illness Patient is 31 year old Caucasian separated female who came for her followup appointment.  Patient was admitted at behavioral Center for 3 days.  She was release from the hospital on June 24.  She admitted due to increased depression and overwhelming distress.  At her hospital stay her medications were adjusted.  She is taking Lexapro 20 mg daily along with Geodon 80 mg at bedtime.  Her Xanax were also increase to 3 times a day.  Overall she is feeling better however she continued to endorse paranoia and hallucination.  She feel sedated when she takes Lexapro in the morning.  She denies any recent self abusive behavior in past 2 weeks.  She admitted some crying spells and racing thoughts but denies any active or passive suicidal thinking.  She is concerned about her paranoia and occasional auditory hallucination.  She continued to have stress at her home.  Her mother requires dialysis recently her port got infected and may require procedure.  Patient is taking care of her mother.  She denies any recent agitation and anger but admitted getting frustrated and still has some mood irritability.  She reported no side effects of medication.  She denies any drinking or using any illegal substance.  She's excited as she has lost weight in past 3 weeks.  She had applied for disability as she realized that she cannot work.   Current psychiatric medication. Xanax 0.5 mg 3 times a day  Lamictal 200 mg tablet daily Geodon 80 mg at bedtime. Lexapro 20 mg daily  Past psychiatric history. Patient has history of significant depression and mood swings. She has at least 4 psychiatric admissions in past.  In 2000-2003 she was admitted due to cutting herself and suicidal thinking. In the past. She had tried Geodon, Depakote, Lamictal, Prozac, Abilify, Lexapro, Celexa, Effexor, Paxil, Risperdal, Ativan and Klonopin. She  admitted history of anger, severe mood swings, and irritability.  Her last psychiatric admission was in June 2013.  Psychosocial history. Patient was born and lived in [place]. She's been married twice. She has one son from her previous marriage and a daughter from her second marriage. She lives by herself. She has supportive parents who live in [place]. She is recently separated from her husband due to failure of reconciliation. She started her job however she isn't orientation phase.  She is working at [employer].  Medical history. Patient has obesity, and chronic migraine headache. She does not have any recent blood work. She is taking Imitrex as needed for headache.  Her blood work in March 2013 showed hemoglobin A1c is 6 however her liver enzymes and kidney functions are normal.  Her primary care physician is [facility] internal medicine.    Family history. Patient endorsed significant history of depression anxiety in her family. Her brother, sister and father has depression and anxiety.  Education background and work history. 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   Alcohol and substance use history. Patient denies any history of alcohol or substance use. She denies a history of DWI.  Mental status examination. Patient is a obese, female, who is casually dressed. She is calm and appears less tense.  She's cooperative and maintained good eye contact.  She described her mood is better from the past and her  affect is appropriate.  She denies any active or passive suicidal thoughts or homicidal thoughts.  She endorse some time auditory hallucination but denies any visual hallucination.  She also endorse some paranoia about people however she denies any obsessive thoughts or delusions.  There were no flight of ideas or loose association .  Her attention and  concentration is fair.  She's alert and oriented x3. Her insight judgment and impulse control is fair.  Assessment. Axis I mood disorder NOS, bipolar disorder 1, posttraumatic stress disorder, r/o Major depressive disorder Axis II rule out borderline personality trait. Axis III obesity, chronic headache. Axis IV moderate. Axis V 60-65.  Plan. I review recent discharge summary, psychosocial stressor, response to medication and collateral information.  I do believe patient has residual symptoms of psychosis and depression.  I recommend to increase Geodon to 100 mg at bedtime.  I also recommend to increase Lamictal to 250 mg daily and change her Lexapro to take it bedtime.  I explained the risk and benefits of medication including rash with Lamictal.  Patient is tolerating her medication without any side effects except sedation with Lexapro in the day.  Overall patient is improving from the past.  She will discuss with her therapist about the DBT therapy .  We discussed the safety plan that anytime if she having suicidal thoughts or homicidal thoughts and she need to call 911 or go to local emergency room.  She will continue her Xanax 0.5 mg up to 3 times a day as needed.  Time spent 30 minutes.  I will see her again in 2-3 weeks.  Portion of this note is generated with voice recognition software and may contain typographical error.
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
## 2012-05-18 ENCOUNTER — Other Ambulatory Visit (HOSPITAL_COMMUNITY): Payer: Self-pay | Admitting: Psychiatry

## 2012-05-18 ENCOUNTER — Telehealth (HOSPITAL_COMMUNITY): Payer: Self-pay | Admitting: *Deleted

## 2012-05-29 ENCOUNTER — Ambulatory Visit (HOSPITAL_COMMUNITY): Payer: Self-pay | Admitting: Psychiatry

## 2012-06-06 ENCOUNTER — Telehealth (HOSPITAL_COMMUNITY): Payer: Self-pay | Admitting: *Deleted

## 2012-06-06 ENCOUNTER — Encounter (HOSPITAL_COMMUNITY): Payer: Self-pay | Admitting: Psychiatry

## 2012-06-06 MED ORDER — ZIPRASIDONE HCL 80 MG PO CAPS: 80.0000 mg | ORAL_CAPSULE | Freq: Every day | ORAL | Status: DC

## 2012-06-06 MED ORDER — LAMOTRIGINE 200 MG PO TABS: 200.0000 mg | ORAL_TABLET | Freq: Every day | ORAL | Status: DC

## 2012-06-06 MED ORDER — ESCITALOPRAM OXALATE 20 MG PO TABS: 20.0000 mg | ORAL_TABLET | Freq: Every day | ORAL | Status: DC

## 2012-06-07 ENCOUNTER — Other Ambulatory Visit (HOSPITAL_COMMUNITY): Payer: Self-pay | Admitting: Psychiatry

## 2012-06-07 ENCOUNTER — Telehealth (HOSPITAL_COMMUNITY): Payer: Self-pay | Admitting: *Deleted

## 2012-06-07 DIAGNOSIS — F319 Bipolar disorder, unspecified: Secondary | ICD-10-CM

## 2012-06-07 MED ORDER — ALPRAZOLAM 0.5 MG PO TABS: 0.5000 mg | ORAL_TABLET | Freq: Three times a day (TID) | ORAL | Status: DC

## 2012-06-07 MED ORDER — ZIPRASIDONE HCL 20 MG PO CAPS: 20.0000 mg | ORAL_CAPSULE | Freq: Two times a day (BID) | ORAL | Status: DC

## 2012-06-07 MED ORDER — LAMOTRIGINE 25 MG PO TABS: ORAL_TABLET | ORAL | Status: DC

## 2012-06-07 NOTE — Progress Notes (Signed)
This encounter was created in error - please disregard.

## 2012-06-15 ENCOUNTER — Ambulatory Visit (INDEPENDENT_AMBULATORY_CARE_PROVIDER_SITE_OTHER): Payer: 59 | Admitting: Psychiatry

## 2012-06-15 DIAGNOSIS — F319 Bipolar disorder, unspecified: Secondary | ICD-10-CM

## 2012-06-15 NOTE — Patient Instructions (Signed)
Discussed orally 
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
## 2012-06-20 ENCOUNTER — Encounter (HOSPITAL_COMMUNITY): Payer: Self-pay | Admitting: Psychiatry

## 2012-06-20 ENCOUNTER — Ambulatory Visit (INDEPENDENT_AMBULATORY_CARE_PROVIDER_SITE_OTHER): Payer: 59 | Admitting: Psychiatry

## 2012-06-20 VITALS — Wt 202.0 lb

## 2012-06-20 DIAGNOSIS — F319 Bipolar disorder, unspecified: Secondary | ICD-10-CM

## 2012-06-20 DIAGNOSIS — F39 Unspecified mood [affective] disorder: Secondary | ICD-10-CM

## 2012-06-20 DIAGNOSIS — F4310 Post-traumatic stress disorder, unspecified: Secondary | ICD-10-CM

## 2012-06-20 MED ORDER — ALPRAZOLAM 0.5 MG PO TABS: 0.5000 mg | ORAL_TABLET | Freq: Three times a day (TID) | ORAL | Status: DC

## 2012-06-20 MED ORDER — LAMOTRIGINE 150 MG PO TABS: 150.0000 mg | ORAL_TABLET | Freq: Two times a day (BID) | ORAL | Status: DC

## 2012-06-20 MED ORDER — ESCITALOPRAM OXALATE 20 MG PO TABS: 20.0000 mg | ORAL_TABLET | Freq: Every day | ORAL | Status: DC

## 2012-06-20 MED ORDER — ZIPRASIDONE HCL 60 MG PO CAPS: 60.0000 mg | ORAL_CAPSULE | Freq: Two times a day (BID) | ORAL | Status: DC

## 2012-06-20 NOTE — Progress Notes (Signed)
Chief complaint Medication management and followup.      History of present illness Patient is 31 year old Caucasian separated female who came for her followup appointment.  On her last visit we have increased Lamictal and Geodon.  Patient doing somewhat better with the medication however she continued to endorse bad dreams and paranoia.  She has at least one episode been she scratch her leg with a small pin but there were no bleeding or injuries.  Patient was disappointed when she received the letter that her disability has not approved.  She is thinking to go back to work.  She is seeing therapist however she was recommend to see therapist for DBT but patient is reluctant to travel [facility] for DBT groups.  He recently she denies any active or passive suicidal thoughts but endorse chronic depression and isolation.  She denies any agitation anger mood swing but gets frustrated with her situation.  She admitted bad dreams which are some time very intense.  She denies any side effects of medication.  She likes increase Geodon and Lamictal.  She denies any tremors or shakes.  She's not drinking or using any illegal substance.   Current psychiatric medication. Xanax 0.5 mg 3 times a day  Lamictal 250 mg tablet daily Geodon 100 mg at bedtime. Lexapro 20 mg daily  Past psychiatric history. Patient has history of significant depression and mood swings. She has at least 4 psychiatric admissions in past.  In 2000-2003 she was admitted due to cutting herself and suicidal thinking. In the past. She had tried Geodon, Depakote, Lamictal, Prozac, Abilify, Lexapro, Celexa, Effexor, Paxil, Risperdal, Ativan and Klonopin. She admitted history of anger, severe mood swings, and irritability.  Her last psychiatric admission was in June 2013.  Psychosocial history. Patient was born and lived in [place]. She's been married twice. She has one son from her previous marriage and a daughter from her second  marriage. She lives by herself. She has supportive parents who live in [place]. She is recently separated from her husband due to failure of reconciliation. She started her job however she isn't orientation phase.  She is working at [employer].  Medical history. Patient has obesity, and chronic migraine headache. She does not have any recent blood work. She is taking Imitrex as needed for headache.  Her blood work in March 2013 showed hemoglobin A1c is 6 however her liver enzymes and kidney functions are normal.  Her primary care physician is [facility] internal medicine.    Family history. Patient endorsed significant history of depression anxiety in her family. Her brother, sister and father has depression and anxiety.  Education background and work history. 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   Alcohol and substance use history. Patient denies any history of alcohol or substance use. She denies a history of DWI.  Mental status examination. Patient is a obese, female, who is casually dressed. She is calm and appears less tense.  She's cooperative and maintained fair eye contact.  She described her mood is sad and her affect is constricted.  She denies any active or passive suicidal thoughts or homicidal thoughts.  She denies any auditory hallucination or visual hallucination.  She endorse some paranoia about people however she denies any obsessive thoughts or delusions.  There were no flight of ideas or loose association .  Her attention and concentration is fair.  She's alert and oriented x3. Her insight judgment and impulse control is fair.  Assessment. Axis I mood disorder NOS, bipolar disorder 1, posttraumatic stress disorder, r/o Major depressive disorder Axis II rule out borderline personality trait. Axis III obesity, chronic headache. Axis IV  moderate. Axis V 60-65.  Plan. I review recent psychosocial stressor, response to medication and collateral information.  I do believe patient has residual symptoms of psychosis and depression.  I recommend to increase Geodon to 120 mg at bedtime.  I also recommend to increase Lamictal to 300 mg daily and continue her Lexapro at bedtime.  Patient feel some improvement with the medication adjustment.  We discussed safety plan that anytime if she feel worsening of the symptom or having any suicidal thoughts or homicidal thoughts then she need to call 911 or go to local emergency room.  I encourage her for DBT which can help herself abusive behavior.  I encourage her to see therapist for coping and social skills.  Time spent 30 minutes.  I will see her again in 4 weeks.  We will consider adding lithium if she continued to have depressive thoughts.  Portion of this note is generated with voice recognition software and may contain typographical error.

## 2012-06-30 ENCOUNTER — Ambulatory Visit (HOSPITAL_COMMUNITY): Payer: Self-pay | Admitting: Psychiatry

## 2012-07-18 ENCOUNTER — Encounter (HOSPITAL_COMMUNITY): Payer: Self-pay | Admitting: Psychiatry

## 2012-07-18 ENCOUNTER — Ambulatory Visit (HOSPITAL_COMMUNITY): Payer: Self-pay | Admitting: Psychiatry

## 2012-07-18 ENCOUNTER — Ambulatory Visit (INDEPENDENT_AMBULATORY_CARE_PROVIDER_SITE_OTHER): Payer: 59 | Admitting: Psychiatry

## 2012-07-18 VITALS — BP 132/80 | HR 72 | Wt 203.0 lb

## 2012-07-18 DIAGNOSIS — F319 Bipolar disorder, unspecified: Secondary | ICD-10-CM

## 2012-07-18 DIAGNOSIS — F39 Unspecified mood [affective] disorder: Secondary | ICD-10-CM

## 2012-07-18 DIAGNOSIS — F4310 Post-traumatic stress disorder, unspecified: Secondary | ICD-10-CM

## 2012-07-18 MED ORDER — ALPRAZOLAM 0.5 MG PO TABS: 0.5000 mg | ORAL_TABLET | Freq: Three times a day (TID) | ORAL | Status: DC

## 2012-07-18 MED ORDER — ESCITALOPRAM OXALATE 20 MG PO TABS: 20.0000 mg | ORAL_TABLET | Freq: Every day | ORAL | Status: DC

## 2012-07-18 MED ORDER — LAMOTRIGINE 150 MG PO TABS: 150.0000 mg | ORAL_TABLET | Freq: Two times a day (BID) | ORAL | Status: DC

## 2012-07-18 MED ORDER — HYDROXYZINE PAMOATE 50 MG PO CAPS
50.0000 mg | ORAL_CAPSULE | Freq: Two times a day (BID) | ORAL | Status: AC | PRN
Start: 2012-07-18 — End: 2012-07-28

## 2012-07-18 MED ORDER — ZIPRASIDONE HCL 60 MG PO CAPS: 60.0000 mg | ORAL_CAPSULE | Freq: Two times a day (BID) | ORAL | Status: DC

## 2012-07-18 NOTE — Progress Notes (Signed)
Chief complaint I am more distressed and anxious.  I started working at [employer] but I feel very nervous and overwhelmed.       History of present illness Patient is 31 year old Caucasian separated female who came for her followup appointment.  On her last visit we have increased Lamictal and Geodon.  Patient was doing better until recently she start working at [employer].  She's working as a Conservation officer, nature for past 2 weeks.  Patient reports increased depression anxiety and crying spells.  She feels very nervous when she go to work.  She admitted at least one episode when she described her legs with sharp object but there were no bleeding or injuries.  She was disappointed that she could not get her disability .  She also feel guilty that despite having a bachelor degree she has to work at [employer].  She realized that she cannot do any hypofunctional job and nauseous he thinking to appeal the decision.  She is thinking to get lawyer for disability.  She admitted crying episodes poor sleep and feeling very nervous when she works at [employer].  She is working part-time.  She's been living with her husband for past 4 weeks.  Patient told that she's been working on her marriage and husband has been very supportive.  She wants to continue her medication but feeling need something for her anxiety.  She's not drinking or using any illegal substance.   Current psychiatric medication. Xanax 0.5 mg 3 times a day  Lamictal 300 mg tablet daily Geodon 120 mg at bedtime. Lexapro 20 mg daily  Past psychiatric history. Patient has history of significant depression and mood swings. She has at least 4 psychiatric admissions in past.  In 2000-2003 she was admitted due to cutting herself and suicidal thinking. In the past. She had tried Geodon, Depakote, Lamictal, Prozac, Abilify, Lexapro, Celexa, Effexor, Paxil, Risperdal, Ativan and Klonopin. She admitted history of anger, severe mood swings, and irritability.  Her last  psychiatric admission was in June 2013.  Psychosocial history. Patient was born and lived in [place]. She's been married twice. She has one son from her previous marriage and a daughter from her second marriage. She used to live by herself however recently her husband move-in and she is working on her marriage.    Medical history. Patient has obesity, and chronic migraine headache. She does not have any recent blood work. She is taking Imitrex as needed for headache.  Her blood work in March 2013 showed hemoglobin A1c is 6 however her liver enzymes and kidney functions are normal.  Her primary care physician is [facility] internal medicine.    Family history. Patient endorsed significant history of depression anxiety in her family. Her brother, sister and father has depression and anxiety.  Education background and work history. She has bachelor degree in Presenter, broadcasting.  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  She worked briefly at [employer] but quit her job do to feeling overwhelmed.  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    Alcohol and substance use history. Patient denies any history of alcohol or substance use. She denies a history of DWI.  Mental status examination. Patient is a obese, female, who is casually dressed. She is anxious and tearful.  She described her mood is sad nervous and her affect is constricted.  She maintained fair eye contact.  She denies any active or  passive suicidal thoughts or homicidal thoughts.  She denies any auditory hallucination or visual hallucination.  She endorse some paranoia about people however she denies any obsessive thoughts or delusions.  There were no flight of ideas or loose association .  Her attention and concentration is fair.  She's alert and oriented x3. Her insight judgment and impulse control is fair.  Assessment. Axis I mood disorder NOS,  bipolar disorder 1, posttraumatic stress disorder, r/o Major depressive disorder Axis II rule out borderline personality trait. Axis III obesity, chronic headache. Axis IV moderate. Axis V 60-65.  Plan. I review her psychosocial stressors, vitals, response to the medication and previous progress note.  She has been recommended few times to do DBT however patient declined.  She's also not seeing therapist in this office.  I encourage her to control herself in DBT for her borderline traits.  We talk about long-term prognosis of her illness and recommend to file for disability as patient feel more distressed and unable to function at work.  She was doing better when she was not working.  I will add Vistaril 50 mg as needed for severe anxiety episodes.  Time spent 30 minutes.  We talk about safety plan that in case any active suicidal thoughts or homicidal thoughts and she need to call 911 or go to local emergency room.  We talk about self abusive behavior that need to be addressed in DBT.  I will see her again in 4 weeks.  Portion of this note is generated with voice recognition software and may contain typographical error.

## 2012-07-25 ENCOUNTER — Ambulatory Visit (HOSPITAL_COMMUNITY): Payer: Self-pay | Admitting: Psychiatry

## 2012-07-27 ENCOUNTER — Ambulatory Visit (INDEPENDENT_AMBULATORY_CARE_PROVIDER_SITE_OTHER): Payer: 59 | Admitting: Psychiatry

## 2012-07-27 DIAGNOSIS — F319 Bipolar disorder, unspecified: Secondary | ICD-10-CM
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
## 2012-07-27 NOTE — Patient Instructions (Signed)
Discussed orally 

## 2012-08-03 ENCOUNTER — Ambulatory Visit (HOSPITAL_COMMUNITY): Payer: Self-pay | Admitting: Psychiatry

## 2012-08-04 ENCOUNTER — Telehealth (HOSPITAL_COMMUNITY): Payer: Self-pay | Admitting: *Deleted

## 2012-08-04 ENCOUNTER — Ambulatory Visit (INDEPENDENT_AMBULATORY_CARE_PROVIDER_SITE_OTHER): Payer: 59 | Admitting: Psychiatry

## 2012-08-04 DIAGNOSIS — F319 Bipolar disorder, unspecified: Secondary | ICD-10-CM

## 2012-08-07 NOTE — Telephone Encounter (Signed)
Call returned.  Patient does not require any paperwork to start her DBT.  She scheduled to have her DBT group starting this Wednesday.

## 2012-08-08 ENCOUNTER — Telehealth (HOSPITAL_COMMUNITY): Payer: Self-pay | Admitting: *Deleted

## 2012-08-10 NOTE — Patient Instructions (Signed)
Discussed orally 

## 2012-08-10 NOTE — Telephone Encounter (Signed)
Call returned and left message for [facility].
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
## 2012-08-17 ENCOUNTER — Ambulatory Visit (HOSPITAL_COMMUNITY): Payer: Self-pay | Admitting: Psychiatry

## 2012-08-22 ENCOUNTER — Telehealth (HOSPITAL_COMMUNITY): Payer: Self-pay | Admitting: *Deleted

## 2012-08-23 ENCOUNTER — Other Ambulatory Visit (HOSPITAL_COMMUNITY): Payer: Self-pay | Admitting: Psychiatry

## 2012-08-23 DIAGNOSIS — F319 Bipolar disorder, unspecified: Secondary | ICD-10-CM

## 2012-08-23 MED ORDER — ZIPRASIDONE HCL 60 MG PO CAPS: 60.0000 mg | ORAL_CAPSULE | Freq: Two times a day (BID) | ORAL | Status: DC

## 2012-08-23 MED ORDER — LAMOTRIGINE 150 MG PO TABS: 150.0000 mg | ORAL_TABLET | Freq: Two times a day (BID) | ORAL | Status: DC

## 2012-08-23 MED ORDER — ALPRAZOLAM 0.5 MG PO TABS: 0.5000 mg | ORAL_TABLET | Freq: Three times a day (TID) | ORAL | Status: DC

## 2012-08-23 MED ORDER — ESCITALOPRAM OXALATE 20 MG PO TABS: 20.0000 mg | ORAL_TABLET | Freq: Every day | ORAL | Status: DC

## 2012-08-23 NOTE — Telephone Encounter (Signed)
Patient like to see me in [place] office.  She will be recommended to see provider in [place] office.  I authorized 30 day prescription under she see Dr. Dan Humphreys on November 1.

## 2012-08-24 ENCOUNTER — Ambulatory Visit (HOSPITAL_COMMUNITY): Payer: Self-pay | Admitting: Psychiatry

## 2012-08-26 ENCOUNTER — Other Ambulatory Visit (HOSPITAL_COMMUNITY): Payer: Self-pay | Admitting: Psychiatry

## 2012-08-26 DIAGNOSIS — F419 Anxiety disorder, unspecified: Secondary | ICD-10-CM

## 2012-08-27 ENCOUNTER — Other Ambulatory Visit (HOSPITAL_COMMUNITY): Payer: Self-pay | Admitting: Psychiatry

## 2012-08-30 ENCOUNTER — Telehealth (HOSPITAL_COMMUNITY): Payer: Self-pay | Admitting: *Deleted

## 2012-08-30 NOTE — Telephone Encounter (Signed)
Call returned.  Explained that patient will be seen Dr. Dan Humphreys on 09/08/2012.  Which is one week from now.  Recommend to keep appointment with Dr. Dan Humphreys.  However if his any early appointment secretary will call you.

## 2012-09-05 ENCOUNTER — Ambulatory Visit (HOSPITAL_COMMUNITY): Payer: Self-pay | Admitting: Psychiatry

## 2012-09-06 ENCOUNTER — Ambulatory Visit (HOSPITAL_COMMUNITY): Payer: Self-pay | Admitting: Psychiatry

## 2012-09-08 ENCOUNTER — Encounter (HOSPITAL_COMMUNITY): Payer: Self-pay | Admitting: Psychiatry

## 2012-09-08 ENCOUNTER — Ambulatory Visit (INDEPENDENT_AMBULATORY_CARE_PROVIDER_SITE_OTHER): Payer: 59 | Admitting: Psychiatry

## 2012-09-08 ENCOUNTER — Ambulatory Visit (HOSPITAL_COMMUNITY): Payer: Self-pay | Admitting: Psychiatry

## 2012-09-08 VITALS — BP 128/82 | HR 96 | Ht 60.0 in | Wt 213.6 lb

## 2012-09-08 DIAGNOSIS — F4310 Post-traumatic stress disorder, unspecified: Secondary | ICD-10-CM

## 2012-09-08 DIAGNOSIS — F39 Unspecified mood [affective] disorder: Secondary | ICD-10-CM

## 2012-09-08 DIAGNOSIS — F419 Anxiety disorder, unspecified: Secondary | ICD-10-CM

## 2012-09-08 DIAGNOSIS — F32A Depression, unspecified: Secondary | ICD-10-CM

## 2012-09-08 DIAGNOSIS — E559 Vitamin D deficiency, unspecified: Secondary | ICD-10-CM

## 2012-09-08 DIAGNOSIS — F319 Bipolar disorder, unspecified: Secondary | ICD-10-CM

## 2012-09-08 DIAGNOSIS — F329 Major depressive disorder, single episode, unspecified: Secondary | ICD-10-CM

## 2012-09-08 MED ORDER — RISPERIDONE 1 MG PO TABS: ORAL_TABLET | ORAL | Status: DC

## 2012-09-08 MED ORDER — FLUOXETINE HCL 10 MG PO CAPS: ORAL_CAPSULE | ORAL | Status: DC

## 2012-09-08 MED ORDER — HYDROXYZINE PAMOATE 50 MG PO CAPS: 50.0000 mg | ORAL_CAPSULE | Freq: Two times a day (BID) | ORAL | Status: DC

## 2012-09-08 MED ORDER — LAMOTRIGINE 150 MG PO TABS: 150.0000 mg | ORAL_TABLET | Freq: Two times a day (BID) | ORAL | Status: DC

## 2012-09-08 MED ORDER — ZIPRASIDONE HCL 80 MG PO CAPS: 80.0000 mg | ORAL_CAPSULE | Freq: Two times a day (BID) | ORAL | Status: DC

## 2012-09-08 MED ORDER — ZIPRASIDONE HCL 60 MG PO CAPS: 60.0000 mg | ORAL_CAPSULE | Freq: Two times a day (BID) | ORAL | Status: DC

## 2012-09-08 MED ORDER — ALPRAZOLAM 0.5 MG PO TABS: 0.5000 mg | ORAL_TABLET | Freq: Three times a day (TID) | ORAL | Status: DC

## 2012-09-08 NOTE — Patient Instructions (Addendum)
Add the Risperdal for anxiety.  Continue working with Gigi Gin on the borderline personality issues.  Get DBT handouts and work on them with Peggy.

## 2012-09-08 NOTE — Progress Notes (Signed)
Chief complaint I am miserable.  I am very depressed.    History of present illness Patient is 31 year old Caucasian separated female who came for her followup appointment.  On her last visit the Geodon increased with better relief from the paranoia.  She describes self mutliation again as she has a history of relieving the anxiety through that method.  She describes feelings of emptiness, intense interpersonal relationships, viewing others in extremely varying ways.  She is very bothered by these symptoms.  These seem consistent with borderline personality traits.  She is agreeable to trying Risperdal for that.  Will add that to the current regimen.   She is no longer working at [employer].  She is very depressed that she can no longer work at [employer].   Current psychiatric medication. Xanax 0.5 mg 3 times a day  Lamictal 150 mg tablet twice daily Geodon 120mg  at bed Lexapro 20 mg daily  Past psychiatric history. Patient has history of significant depression and mood swings. She has at least 4 psychiatric admissions in past.  In 2000-2003 she was admitted due to cutting herself and suicidal thinking. In the past. She had tried Geodon, Depakote, Lamictal, Prozac, Abilify, Lexapro, Celexa, Effexor, Paxil, Risperdal, Ativan and Klonopin. She admitted history of anger, severe mood swings, and irritability.  Her last psychiatric admission was in June 2013.  Psychosocial history. Patient was born and lived in [place]. She's been married twice. She has one son from her previous marriage and a daughter from her second marriage. She used to live by herself however recently her husband move-in and she is working on her marriage.    Medical history. Patient has obesity, and chronic migraine headache. She does not have any recent blood work. She is taking Imitrex as needed for headache.  Her blood work in March 2013 showed hemoglobin A1c is 6 however her liver enzymes and kidney functions  are normal.  Her primary care physician is [facility].    Family history. Patient endorsed significant history of depression anxiety in her family. Her brother, sister and father has depression and anxiety.  Education background and work history. She has bachelor degree in Presenter, broadcasting.  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  She worked briefly at [employer] but quit her job do to feeling overwhelmed.  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    Alcohol and substance use history. Patient denies any history of alcohol or substance use. She denies a history of DWI.  She has used Xanax for two years and is not willing to stop this for Neurontin for her anxiety despite noting memory loss with the use of Xanax.  Mental status examination. Patient is a obese, female, who is casually dressed. She is anxious and tearful.  She described her mood is sad nervous and her affect is constricted.  She maintained fair eye contact.  She denies any active or passive suicidal thoughts or homicidal thoughts.  She denies any auditory hallucination or visual hallucination.  She endorse some paranoia about people however she denies any obsessive thoughts or delusions.  There were no flight of ideas or loose association .  Her attention and concentration is fair.  She's alert and oriented x3. Her insight judgment and impulse control is fair.  Assessment. Axis I mood disorder NOS, bipolar disorder 1, posttraumatic stress disorder, r/o Major depressive disorder Axis II rule out  borderline personality trait. Axis III obesity, chronic headache. Axis IV moderate. Axis V 60-65.  Plan. I review her psychosocial stressors, vitals, response to the medication and previous progress note.  Consulted with Elana the pharmacist at [facility] on her regimen.  The suggestion of switching Geodon and Lamictal for Lithium was rejected by the  patient.  She is agreeable to increasing the Geodon.  However the anxiety seems mor coming from her borderline personality, so will add Risperdal instead.  There is no history of cardiac problems. Still recommend that she engage in DBT for the personality responses she gets from others.

## 2012-09-14 ENCOUNTER — Ambulatory Visit (HOSPITAL_COMMUNITY): Payer: Self-pay | Admitting: Psychiatry

## 2012-09-18 MED ORDER — CALCIUM CARBONATE-VITAMIN D 500-200 MG-UNIT PO TABS: 1.0000 | ORAL_TABLET | Freq: Two times a day (BID) | ORAL | Status: DC

## 2012-09-18 MED ORDER — ERGOCALCIFEROL 1.25 MG (50000 UT) PO CAPS: 50000.0000 [IU] | ORAL_CAPSULE | ORAL | Status: DC

## 2012-09-18 NOTE — Addendum Note (Signed)
Addended by: Mike Craze on: 09/18/2012 11:46 AM   Modules accepted: Orders

## 2012-09-18 NOTE — Progress Notes (Signed)
Patient ID: Teresa Tran, female   DOB: 07-07-1981, 31 y.o.   MRN: [id number] VItamin D level low at 17!  Replacement ordered.

## 2012-09-29 NOTE — Progress Notes (Signed)
Patient ID: Teresa Tran, female   DOB: [birth year], 31 y.o.   MRN: [id number] VITAMIN D LOW AT 17! Needs replacement Vitamin D. Will order

## 2012-10-10 ENCOUNTER — Ambulatory Visit (HOSPITAL_COMMUNITY): Payer: Self-pay | Admitting: Psychiatry

## 2012-10-11 ENCOUNTER — Ambulatory Visit (INDEPENDENT_AMBULATORY_CARE_PROVIDER_SITE_OTHER): Payer: 59 | Admitting: Psychiatry

## 2012-10-11 ENCOUNTER — Encounter (HOSPITAL_COMMUNITY): Payer: Self-pay | Admitting: Psychiatry

## 2012-10-11 VITALS — BP 118/78 | HR 68 | Wt 222.6 lb

## 2012-10-11 DIAGNOSIS — F319 Bipolar disorder, unspecified: Secondary | ICD-10-CM

## 2012-10-11 DIAGNOSIS — F4310 Post-traumatic stress disorder, unspecified: Secondary | ICD-10-CM

## 2012-10-11 DIAGNOSIS — E559 Vitamin D deficiency, unspecified: Secondary | ICD-10-CM

## 2012-10-11 DIAGNOSIS — R635 Abnormal weight gain: Secondary | ICD-10-CM

## 2012-10-11 DIAGNOSIS — F419 Anxiety disorder, unspecified: Secondary | ICD-10-CM

## 2012-10-11 MED ORDER — ERGOCALCIFEROL 1.25 MG (50000 UT) PO CAPS: 50000.0000 [IU] | ORAL_CAPSULE | ORAL | Status: DC

## 2012-10-11 MED ORDER — CALCIUM CARBONATE-VITAMIN D 500-200 MG-UNIT PO TABS: 1.0000 | ORAL_TABLET | Freq: Two times a day (BID) | ORAL | Status: DC

## 2012-10-11 MED ORDER — ZIPRASIDONE HCL 60 MG PO CAPS: 60.0000 mg | ORAL_CAPSULE | Freq: Two times a day (BID) | ORAL | Status: DC

## 2012-10-11 MED ORDER — FLUOXETINE HCL 40 MG PO CAPS: ORAL_CAPSULE | ORAL | Status: DC

## 2012-10-11 MED ORDER — HYDROXYZINE PAMOATE 50 MG PO CAPS: 50.0000 mg | ORAL_CAPSULE | Freq: Two times a day (BID) | ORAL | Status: DC

## 2012-10-11 MED ORDER — ALPRAZOLAM 0.5 MG PO TABS: 0.5000 mg | ORAL_TABLET | Freq: Three times a day (TID) | ORAL | Status: DC

## 2012-10-11 MED ORDER — LAMOTRIGINE 150 MG PO TABS: 150.0000 mg | ORAL_TABLET | Freq: Two times a day (BID) | ORAL | Status: DC

## 2012-10-11 NOTE — Progress Notes (Signed)
Chief complaint I am miserable.  I am very depressed.  I am gaining weight on the meds.   History of present illness Patient is 31 year old Caucasian separated female who came for her followup appointment.  Pt reports complinance with meds, but has noted weight gain. She has gained 9 pounds since Nov 1st.  She noted a little benefit from the Prozac, but feels that the weight gain has distracted form any benefit from the Risperdal.  She notes benefit from the Vistaril, Geodon, Lamictal, and Xanax.  She was not called directly about the Vitamin D being low and never was notified that Vit D had been ordered for her.  She requests that all her scripts be printed out.  She admits to cutting on herself once since the last appointment.   Current psychiatric medication. Xanax 0.5 mg 3 times a day  Lamictal 150 mg tablet twice daily Geodon 120mg  at bed Risperdal 0.5 mg BID and 1.5 mg at HS Prozac 30 mg daily  Past psychiatric history. Patient has history of significant depression and mood swings. She has at least 4 psychiatric admissions in past.  In 2000-2003 she was admitted due to cutting herself and suicidal thinking. In the past. She had tried Geodon, Depakote, Lamictal, Prozac, Abilify, Lexapro, Celexa, Effexor, Paxil, Risperdal, Ativan and Klonopin. She admitted history of anger, severe mood swings, and irritability.  Her last psychiatric admission was in June 2013.  Psychosocial history. Patient was born and lived in [place]. She's been married twice. She has one son from her previous marriage and a daughter from her second marriage. She used to live by herself however recently her husband move-in and she is working on her marriage.    Medical history. Patient has obesity, and chronic migraine headache. She does not have any recent blood work. She is taking Imitrex as needed for headache.  Her blood work in March 2013 showed hemoglobin A1c is 6 however her liver enzymes and kidney  functions are normal.  Her primary care physician is [facility] internal medicine.    Family history. Patient endorsed significant history of depression anxiety in her family. Her brother, sister and father has depression and anxiety.  Education background and work history. She has bachelor degree in Presenter, broadcasting.  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  She worked briefly at [employer] but quit her job do to feeling overwhelmed.  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    Alcohol and substance use history. Patient denies any history of alcohol or substance use. She denies a history of DWI.  She has used Xanax for two years and is not willing to stop this for Neurontin for her anxiety despite noting memory loss with the use of Xanax.  Mental status examination. Patient is a obese, female, who is casually dressed. She is anxious and tearful.  She described her mood is sad nervous and her affect is constricted.  She maintained fair eye contact.  She denies any active or passive suicidal thoughts or homicidal thoughts.  She denies any auditory hallucination or visual hallucination.  She endorse some paranoia about people however she denies any obsessive thoughts or delusions.  There were no flight of ideas or loose association .  Her attention and concentration is fair.  She's alert and oriented x3. Her insight judgment and impulse control is fair.  Assessment. Axis I mood disorder NOS, bipolar disorder  1, posttraumatic stress disorder, r/o Major depressive disorder Axis II rule out borderline personality trait. Axis III obesity, chronic headache. Axis IV moderate. Axis V 60-65.  Plan. I took her vitals.  I reviewed CC, tobacco/med/surg Hx, meds effects/ side effects, problem list, therapies and responses as well as current situation/symptoms discussed options. See orders and pt instructions for more details.

## 2012-10-11 NOTE — Patient Instructions (Signed)
Stop Risperdal  Start Vitamin D 50,000 units once a week and the OsCal twice a day  Cut back on sugar and carbohydrates, that means very limited fruits and starchy vegetables and very limited grains, breads  The goal is low GLYCEMIC INDEX.  Eat avocados, eggs, lean meat like grass fed beef and chicken

## 2012-11-15 ENCOUNTER — Ambulatory Visit (HOSPITAL_COMMUNITY): Payer: Self-pay | Admitting: Psychiatry

## 2012-11-22 ENCOUNTER — Ambulatory Visit (INDEPENDENT_AMBULATORY_CARE_PROVIDER_SITE_OTHER): Payer: 59 | Admitting: Psychiatry

## 2012-11-22 ENCOUNTER — Encounter (HOSPITAL_COMMUNITY): Payer: Self-pay | Admitting: Psychiatry

## 2012-11-22 VITALS — Wt 226.4 lb

## 2012-11-22 DIAGNOSIS — F39 Unspecified mood [affective] disorder: Secondary | ICD-10-CM

## 2012-11-22 DIAGNOSIS — F4310 Post-traumatic stress disorder, unspecified: Secondary | ICD-10-CM

## 2012-11-22 DIAGNOSIS — F319 Bipolar disorder, unspecified: Secondary | ICD-10-CM

## 2012-11-22 DIAGNOSIS — R635 Abnormal weight gain: Secondary | ICD-10-CM

## 2012-11-22 DIAGNOSIS — F419 Anxiety disorder, unspecified: Secondary | ICD-10-CM

## 2012-11-22 MED ORDER — BUPROPION HCL ER (SR) 100 MG PO TB12: 100.0000 mg | ORAL_TABLET | Freq: Two times a day (BID) | ORAL | Status: DC

## 2012-11-22 MED ORDER — LAMOTRIGINE 150 MG PO TABS: 150.0000 mg | ORAL_TABLET | Freq: Two times a day (BID) | ORAL | Status: DC

## 2012-11-22 MED ORDER — ZIPRASIDONE HCL 60 MG PO CAPS: 60.0000 mg | ORAL_CAPSULE | Freq: Two times a day (BID) | ORAL | Status: DC

## 2012-11-22 MED ORDER — FLUOXETINE HCL 10 MG PO CAPS: 10.0000 mg | ORAL_CAPSULE | Freq: Every day | ORAL | Status: DC

## 2012-11-22 MED ORDER — ALPRAZOLAM 0.5 MG PO TABS: 0.5000 mg | ORAL_TABLET | Freq: Three times a day (TID) | ORAL | Status: DC

## 2012-11-22 MED ORDER — HYDROXYZINE PAMOATE 50 MG PO CAPS: 50.0000 mg | ORAL_CAPSULE | Freq: Two times a day (BID) | ORAL | Status: DC

## 2012-11-22 MED ORDER — FLUOXETINE HCL 40 MG PO CAPS: ORAL_CAPSULE | ORAL | Status: DC

## 2012-11-22 NOTE — Patient Instructions (Signed)
Shift the Geodon to WITH SUPPER for better absorption  Take either the extra Prozac or the Wellbuttrin for about a week or two then add the other  Call if problems or concerns.

## 2012-11-22 NOTE — Progress Notes (Signed)
[facility] [id number] Progress Note Teresa Tran MRN: [id number] DOB: 1981-08-07 Age: 32 y.o.  Date: 11/22/2012 Start Time: 10:25 AM End Time: 11:05 AM  Chief Complaint: Chief Complaint  Patient presents with  . Depression  . Follow-up  . Medication Refill    Subjective: "I am not doing too well.  I still have a whole lot of feeling miserable".  History of present illness Patient is 32 year old Caucasian separated female who came for her followup appointment.  Pt reports that she is compliant with the psychotropic medications with poor benefit and some side effects.  The mood swings and paranoia are better with Geodon and Lamictal, but the depression is so severe that something is not right.  She takes the Geodon at night without any food intake.  Advised pt to shift that to WITH SUPPER so the absorption would be better.  She is doing some cutting on herself.  The last was 3 days ago and made multiple cuts on her (R) thigh.   That helps stop the crying and complete upsetness.  The depression seems to occur at various times of the day.  She has a lot of nightmares and a lot of bad past experiences that are constantly on her mind.  She has never tried Minipress for the nightmares.  She is sleeping off and on throughout the night depending on the nightmare that wakes her up. Described how ECT Khadijah be helpful for depression that doesn't respond to medications.  She is not interested in going into the hospital because of her children that would miss her.  She has not taken Tegretol.  She is on Vitamin D replacement now.   Current psychiatric medication. Xanax 0.5 mg 3 times a day  Vistaril 50 mg 2 to 3 times a day Lamictal 150 mg tablet twice daily Geodon 120mg  at bed Prozac 40 mg daily  Past psychiatric history. Patient has history of significant depression and mood swings. She has at least 4 psychiatric admissions in past.  In 2000-2003 she was admitted due to cutting herself and  suicidal thinking. In the past. She had tried Geodon, Depakote, Lamictal, Prozac, Abilify, Lexapro, Celexa, Effexor, Wellbutrin, BuSpar, Paxil, Risperdal, Ativan and Klonopin. She admitted history of anger, severe mood swings, and irritability.  Her last psychiatric admission was in June 2013.  Psychosocial history. Patient was born and lived in [place]. She's been married twice. She has one son from her previous marriage and a daughter from her second marriage. She used to live by herself however recently her husband move-in and she is working on her marriage.    Medical history. Patient has obesity, and chronic migraine headache. She does not have any recent blood work. She is taking Imitrex as needed for headache.  Her blood work in March 2013 showed hemoglobin A1c is 6 however her liver enzymes and kidney functions are normal.  Her primary care physician is [facility] internal medicine.    Family history. Patient endorsed significant history of depression anxiety in her family. Her brother, sister and father has depression and anxiety. family history includes Anxiety disorder in her brother, father, and sister and Depression in her brother, father, and sister.  There is no history of ADD / ADHD, and Alcohol abuse, and Drug abuse, and Bipolar disorder, and Dementia, and OCD, and Paranoid behavior, and Schizophrenia, and Seizures, and Sexual abuse, and Physical abuse, .  Education background and work history. She has bachelor degree in Presenter, broadcasting.  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  She worked briefly at [employer] but quit her job due to feeling overwhelmed.  She recently worked at [employer] as a Conservation officer, nature for 2 weeks only.    Alcohol and substance use history. Patient denies any history of alcohol or substance use. She denies a history of DWI.  She has used Xanax for two years and is not willing to stop this for  Neurontin for her anxiety despite noting memory loss with the use of Xanax.  Mental status examination. Patient is a obese, female, who is casually dressed. She is anxious and tearful.  She described her mood is sad nervous and her affect is constricted.  She maintained fair eye contact.  She denies any active or passive suicidal thoughts or homicidal thoughts.  She denies any auditory hallucination or visual hallucination.  She endorse some paranoia about people however she denies any obsessive thoughts or delusions.  There were no flight of ideas or loose association .  Her attention and concentration is fair.  She's alert and oriented x3. Her insight judgment and impulse control is fair.  Assessment. Axis I mood disorder NOS, bipolar disorder 1, posttraumatic stress disorder, r/o Major depressive disorder Axis II rule out borderline personality trait. Axis III obesity, chronic headache. Axis IV moderate. Axis V 60-65.  Plan. I took her vitals.  I reviewed CC, tobacco/med/surg Hx, meds effects/ side effects, problem list, therapies and responses as well as current situation/symptoms discussed options. See orders and pt instructions for more details.  Orson Aloe, MD, [facility]

## 2013-01-03 ENCOUNTER — Ambulatory Visit (INDEPENDENT_AMBULATORY_CARE_PROVIDER_SITE_OTHER): Payer: 59 | Admitting: Psychiatry

## 2013-01-03 ENCOUNTER — Encounter (HOSPITAL_COMMUNITY): Payer: Self-pay | Admitting: Psychiatry

## 2013-01-03 VITALS — Wt 215.2 lb

## 2013-01-03 DIAGNOSIS — F4310 Post-traumatic stress disorder, unspecified: Secondary | ICD-10-CM

## 2013-01-03 DIAGNOSIS — F39 Unspecified mood [affective] disorder: Secondary | ICD-10-CM

## 2013-01-03 DIAGNOSIS — R635 Abnormal weight gain: Secondary | ICD-10-CM

## 2013-01-03 DIAGNOSIS — F5105 Insomnia due to other mental disorder: Secondary | ICD-10-CM

## 2013-01-03 DIAGNOSIS — F319 Bipolar disorder, unspecified: Secondary | ICD-10-CM

## 2013-01-03 DIAGNOSIS — F419 Anxiety disorder, unspecified: Secondary | ICD-10-CM

## 2013-01-03 MED ORDER — ALPRAZOLAM 0.5 MG PO TABS: ORAL_TABLET | ORAL | Status: DC

## 2013-01-03 MED ORDER — ZIPRASIDONE HCL 60 MG PO CAPS: 60.0000 mg | ORAL_CAPSULE | Freq: Three times a day (TID) | ORAL | Status: DC

## 2013-01-03 MED ORDER — TRAZODONE HCL 100 MG PO TABS: 50.0000 mg | ORAL_TABLET | Freq: Every day | ORAL | Status: DC

## 2013-01-03 MED ORDER — BUPROPION HCL ER (SR) 100 MG PO TB12: 100.0000 mg | ORAL_TABLET | Freq: Two times a day (BID) | ORAL | Status: DC

## 2013-01-03 MED ORDER — HYDROXYZINE PAMOATE 50 MG PO CAPS: 50.0000 mg | ORAL_CAPSULE | Freq: Four times a day (QID) | ORAL | Status: DC | PRN

## 2013-01-03 MED ORDER — LAMOTRIGINE 200 MG PO TABS: 200.0000 mg | ORAL_TABLET | Freq: Two times a day (BID) | ORAL | Status: DC

## 2013-01-03 MED ORDER — FLUOXETINE HCL 20 MG PO CAPS: ORAL_CAPSULE | ORAL | Status: DC

## 2013-01-03 NOTE — Progress Notes (Addendum)
ADDENDUM: ** ** ** ** ** ** ** ** ** ** ** ** **  HAS NOT BEEN SEEING PEGGY NEEDS TO SEE EITHER PEGGY OR START  DBT IN [place] ** ** ** ** ** ** ** ** ** ** ** ** **   [facility] [address] Progress Note Natonya Finstad MRN: [id number] DOB: [birth year] Age: 32 y.o.  Date: 01/03/2013 Start Time: 9:50 AM End Time: 10:13 AM  Chief Complaint: Chief Complaint  Patient presents with  . Depression  . Follow-up  . Medication Refill    Subjective: "I am getting manic.  My mind is on three things when I'm trying to do something.  I am not sleeping".  History of present illness Patient is 32 year old Caucasian separated female who came for her followup appointment.  Pt reports that she is compliant with the psychotropic medications with poor benefit and possible side effects.  She is getting into a manic phase maybe from the Prozac.  Will cut way back on that and increase the Geodon during the day and some on the Xanax too.    Current psychiatric medication. Xanax 0.5 mg 3 times a day  Vistaril 50 mg 2 to 3 times a day Lamictal 150 mg tablet twice daily Geodon 120mg  with supper Prozac 5 mg daily Vitamin D 50,000 IU every Sunday OsCal twice a day  Past psychiatric history. Patient has history of significant depression and mood swings. She has at least 4 psychiatric admissions in past.  In 2000-2003 she was admitted due to cutting herself and suicidal thinking. In the past. She had tried Geodon, Depakote, Lamictal, Prozac, Abilify, Lexapro, Celexa, Effexor, Wellbutrin, BuSpar, Paxil, Risperdal, Ativan and Klonopin. She admitted history of anger, severe mood swings, and irritability.  Her last psychiatric admission was in June 2013.  Psychosocial history. Patient was born and lived in [place]. She's been married twice. She has one son from her previous marriage and a daughter from her second marriage. She used to live by herself however recently her husband move-in and she is  working on her marriage.    Medical history. Patient has obesity, and chronic migraine headache. She does not have any recent blood work. She is taking Imitrex as needed for headache.  Her blood work in March 2013 showed hemoglobin A1c is 6 however her liver enzymes and kidney functions are normal.  Her primary care physician is [name] internal medicine.    Family history. Patient endorsed significant history of depression anxiety in her family. Her brother, sister and father has depression and anxiety. family history includes Anxiety disorder in her brother, father, and sister and Depression in her brother, father, and sister.  There is no history of ADD / ADHD, and Alcohol abuse, and Drug abuse, and Bipolar disorder, and Dementia, and OCD, and Paranoid behavior, and Schizophrenia, and Seizures, and Sexual abuse, and Physical abuse, .  Education background and work history. She has bachelor degree in Presenter, broadcasting.  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  She worked briefly at [employer] but quit her job due to feeling overwhelmed.  She recently worked at [employer] as a Conservation officer, nature for 2 weeks only.    Alcohol and substance use history. Patient denies any history of alcohol or substance use. She denies a history of DWI.  She has used Xanax for two years and is not willing to stop this for Neurontin for her anxiety despite noting memory  loss with the use of Xanax.  Mental status examination. Patient is a obese, female, who is casually dressed. She is anxious and tearful.  She described her mood is sad nervous and her affect is constricted.  She maintained fair eye contact.  She denies any active or passive suicidal thoughts or homicidal thoughts.  She denies any auditory hallucination or visual hallucination.  She endorse some paranoia about people however she denies any obsessive thoughts or delusions.  There were no flight of ideas  or loose association .  Her attention and concentration is fair.  She's alert and oriented x3. Her insight judgment and impulse control is fair.  Lab Results:  Results for orders placed in visit on 09/08/12 (from the past 8736 hour(s))  VITAMIN D 25 HYDROXY   Collection Time    09/08/12 12:28 PM      Result Value Range   Vit D, 25-Hydroxy 17 (*) 30 - 89 ng/mL  Results for orders placed in visit on 01/27/12 (from the past 8736 hour(s))  HEMOGLOBIN A1C   Collection Time    01/27/12 12:38 PM      Result Value Range   Hemoglobin A1C 6.0 (*) <5.7 %   Mean Plasma Glucose 126 (*) <117 mg/dL  CBC WITH DIFFERENTIAL   Collection Time    01/27/12 12:38 PM      Result Value Range   WBC 6.2  4.0 - 10.5 K/uL   RBC 4.17  3.87 - 5.11 MIL/uL   Hemoglobin 11.9 (*) 12.0 - 15.0 g/dL   HCT 78.2  95.6 - 21.3 %   MCV 87.8  78.0 - 100.0 fL   MCH 28.5  26.0 - 34.0 pg   MCHC 32.5  30.0 - 36.0 g/dL   RDW 08.6  57.8 - 46.9 %   Platelets 229  150 - 400 K/uL   Neutrophils Relative 59  43 - 77 %   Neutro Abs 3.7  1.7 - 7.7 K/uL   Lymphocytes Relative 30  12 - 46 %   Lymphs Abs 1.8  0.7 - 4.0 K/uL   Monocytes Relative 10  3 - 12 %   Monocytes Absolute 0.6  0.1 - 1.0 K/uL   Eosinophils Relative 1  0 - 5 %   Eosinophils Absolute 0.1  0.0 - 0.7 K/uL   Basophils Relative 0  0 - 1 %   Basophils Absolute 0.0  0.0 - 0.1 K/uL   Smear Review Criteria for review not met    COMPREHENSIVE METABOLIC PANEL   Collection Time    01/27/12 12:38 PM      Result Value Range   Sodium 138  135 - 145 mEq/L   Potassium 4.2  3.5 - 5.3 mEq/L   Chloride 104  96 - 112 mEq/L   CO2 25  19 - 32 mEq/L   Glucose, Bld 83  70 - 99 mg/dL   BUN 15  6 - 23 mg/dL   Creat 6.29  5.28 - 4.13 mg/dL   Total Bilirubin 0.5  0.3 - 1.2 mg/dL   Alkaline Phosphatase 65  39 - 117 U/L   AST 21  0 - 37 U/L   ALT 22  0 - 35 U/L   Total Protein 6.8  6.0 - 8.3 g/dL   Albumin 4.3  3.5 - 5.2 g/dL   Calcium 8.9  8.4 - 24.4 mg/dL  Labs typically  drawn from this office.  Will get annual lab when it is expected that the  Vitamin has come up to make one draw do it all.  Assessment. Axis I mood disorder NOS, bipolar disorder 1, posttraumatic stress disorder, r/o Major depressive disorder Axis II rule out borderline personality trait. Axis III obesity, chronic headache. Axis IV moderate. Axis V 60-65.  Plan/Discussion: I took her vitals.  I reviewed CC, tobacco/med/surg Hx, meds effects/ side effects, problem list, therapies and responses as well as current situation/symptoms discussed options. Temporarily stop Prozac, then gradually add back, Increase and spread out Geodon through the day and increase Xanax as needed, bump Lamictal up some, and add Trazodone for insomnia. See orders and pt instructions for more details.  Medical Decision Making Problem Points:  Established problem, worsening (2), Review of last therapy session (1) and Review of psycho-social stressors (1) Data Points:  Review or order clinical lab tests (1) Review of medication regiment & side effects (2) Review of new medications or change in dosage (2)  I certify that outpatient services furnished can reasonably be expected to improve the patient's condition.   Orson Aloe, MD, [facility]

## 2013-01-03 NOTE — Patient Instructions (Addendum)
STOP all Prozac and gradually add back as the mood settles down or gets depressed.  Keep Wellbutrin the same.  Start Trazodone and may go up to two whole pills at night.  Might try 1/2 tab and in 45 minutes take another until you get asleep.  Increase Geodon to 'with all meals'  Increase Xanax and Vistaril as needed.  Lamictal goes up to a stronger pill.

## 2013-01-24 ENCOUNTER — Encounter (HOSPITAL_COMMUNITY): Payer: Self-pay | Admitting: Psychiatry

## 2013-01-24 ENCOUNTER — Ambulatory Visit (INDEPENDENT_AMBULATORY_CARE_PROVIDER_SITE_OTHER): Payer: 59 | Admitting: Psychiatry

## 2013-01-24 VITALS — Wt 220.4 lb

## 2013-01-24 DIAGNOSIS — R635 Abnormal weight gain: Secondary | ICD-10-CM

## 2013-01-24 DIAGNOSIS — F39 Unspecified mood [affective] disorder: Secondary | ICD-10-CM

## 2013-01-24 DIAGNOSIS — F4310 Post-traumatic stress disorder, unspecified: Secondary | ICD-10-CM

## 2013-01-24 DIAGNOSIS — F419 Anxiety disorder, unspecified: Secondary | ICD-10-CM

## 2013-01-24 DIAGNOSIS — F5105 Insomnia due to other mental disorder: Secondary | ICD-10-CM

## 2013-01-24 DIAGNOSIS — E559 Vitamin D deficiency, unspecified: Secondary | ICD-10-CM

## 2013-01-24 DIAGNOSIS — F319 Bipolar disorder, unspecified: Secondary | ICD-10-CM

## 2013-01-24 MED ORDER — TRAZODONE HCL 100 MG PO TABS: 50.0000 mg | ORAL_TABLET | Freq: Every day | ORAL | Status: DC

## 2013-01-24 MED ORDER — ZIPRASIDONE HCL 60 MG PO CAPS: 60.0000 mg | ORAL_CAPSULE | Freq: Three times a day (TID) | ORAL | Status: DC

## 2013-01-24 MED ORDER — ALPRAZOLAM 0.5 MG PO TABS: ORAL_TABLET | ORAL | Status: DC

## 2013-01-24 MED ORDER — BUPROPION HCL ER (SR) 100 MG PO TB12: 100.0000 mg | ORAL_TABLET | Freq: Two times a day (BID) | ORAL | Status: DC

## 2013-01-24 MED ORDER — HYDROXYZINE PAMOATE 50 MG PO CAPS: 50.0000 mg | ORAL_CAPSULE | Freq: Four times a day (QID) | ORAL | Status: DC | PRN

## 2013-01-24 MED ORDER — LAMOTRIGINE 200 MG PO TABS: 200.0000 mg | ORAL_TABLET | Freq: Two times a day (BID) | ORAL | Status: DC

## 2013-01-24 MED ORDER — LITHIUM CARBONATE 150 MG PO CAPS: 150.0000 mg | ORAL_CAPSULE | Freq: Two times a day (BID) | ORAL | Status: DC

## 2013-01-24 NOTE — Patient Instructions (Addendum)
CUT BACK/CUT OUT on sugar and carbohydrates, that means very limited fruits and starchy vegetables and very limited grains, breads  The goal is low GLYCEMIC INDEX.  CUT OUT all wheat, rye, or barley for the GLUTEN in them.  HIGH fat and LOW carbohydrate diet is the KEY.  Eat avocados, eggs, lean meat like grass fed beef and chicken  Nuts and seeds would be good foods as well.   Stevia is an excellent sweetener.  Safe for the brain.   Truvia is also a good safe sweetener, not the baking blend form of Truvia  Almond butter is awesome.  Check out all this on the Internet.  Dr Purlmutter is on the Internet with some good info about this.   http://www.drperlmutter.com is where that is.  An excellent site for info on this diet is http://paloeleap.com  Set a timer for 8 minutes and walk for that amount of time in the house or in the yard.  Mark "8" on a calendar for that day.  Do that every day this week.  Then next week increase the time to 9 minutes and then mark the calendar with a 9 for that day.  Each week increase your exercise by one minute.  Keep a record of this so you can see what progress you are making.  Do this every day, just like eating and sleeping.  It is good for pain control, depression, and for your soul/spirit.  Bring the record in for your next visit so we can talk about your effort and how you feel with the new exercise program going and working for you.  Call if problems or concerns.   

## 2013-01-24 NOTE — Progress Notes (Addendum)
[facility] [id number] Progress Note Teresa Tran MRN: [id number] DOB: 20-Jan-1981 Age: 32 y.o.  Date: 01/24/2013 Start Time: 1:40 PM End Time: 2:00 PM  Chief Complaint: Chief Complaint  Patient presents with  . Depression  . Follow-up  . Medication Refill   Subjective: "I still can't sleep and when I'm doing a project my mind is going every which way thinking of everything and not finishing it". Depression 6/10. Mania 7/10 today and Anxiety 9/10, where 1 is the best and 10 is the worst.  Pain is 0/10.  History of present illness Patient is 32 year old Caucasian separated female who came for her followup appointment.  Pt reports that she is compliant with the psychotropic medications with fair benefit and possible side effects.  She is noting mania that has not abated.  Discussed her cutting back further on the Prozac.  She declines changing them.  She also declines to go back in the hospital.  She had been on Lithium for only a very brief time and does not recall any side effects of problems with that.  Will add Lithium for her uncontrolled mania.  Current psychiatric medication. Xanax 0.5 mg 3 times a day  Vistaril 50 mg 2 to 3 times a day Lamictal 150 mg tablet twice daily Geodon 120mg  with supper Prozac 20 mg daily Vitamin D 50,000 IU every Sunday OsCal twice a day  Past psychiatric history. Patient has history of significant depression and mood swings. She has at least 4 psychiatric admissions in past.  In 2000-2003 she was admitted due to cutting herself and suicidal thinking. In the past. She had tried Geodon, Depakote, Lamictal, Prozac, Abilify, Lexapro, Celexa, Effexor, Wellbutrin, BuSpar, Paxil, Risperdal, Ativan and Klonopin. She admitted history of anger, severe mood swings, and irritability.  Her last psychiatric admission was in June 2013.  Psychosocial history. Patient was born and lived in [place]. She's been married twice. She has one son from her  previous marriage and a daughter from her second marriage. She used to live by herself however recently her husband move-in and she is working on her marriage.    Medical history. Patient has obesity, and chronic migraine headache. She does not have any recent blood work. She is taking Imitrex as needed for headache.  Her blood work in March 2013 showed hemoglobin A1c is 6 however her liver enzymes and kidney functions are normal.  Her primary care physician is [facility] internal medicine.    Family history. Patient endorsed significant history of depression anxiety in her family. Her brother, sister and father has depression and anxiety. family history includes Anxiety disorder in her brother, father, and sister and Depression in her brother, father, and sister.  There is no history of ADD / ADHD, and Alcohol abuse, and Drug abuse, and Bipolar disorder, and Dementia, and OCD, and Paranoid behavior, and Schizophrenia, and Seizures, and Sexual abuse, and Physical abuse, .  Education background and work history. She has bachelor degree in Presenter, broadcasting.  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  She worked briefly at [employer] but quit her job due to feeling overwhelmed.  She recently worked at [employer] as a Conservation officer, nature for 2 weeks only.    Alcohol and substance use history. Patient denies any history of alcohol or substance use. She denies a history of DWI.  She has used Xanax for two years and is not willing to stop this for  Neurontin for her anxiety despite noting memory loss with the use of Xanax.  Mental status examination. Patient is a obese, female, who is casually dressed. She is anxious and tearful.  She described her mood is sad nervous and her affect is constricted.  She maintained fair eye contact.  She denies any active or passive suicidal thoughts or homicidal thoughts.  She denies any auditory hallucination or visual  hallucination.  She endorse some paranoia about people however she denies any obsessive thoughts or delusions.  There were no flight of ideas or loose association .  Her attention and concentration is fair.  She's alert and oriented x3. Her insight judgment and impulse control is fair.  Lab Results:  Results for orders placed in visit on 09/08/12 (from the past 8736 hour(s))  VITAMIN D 25 HYDROXY   Collection Time    09/08/12 12:28 PM      Result Value Range   Vit D, 25-Hydroxy 17 (*) 30 - 89 ng/mL  Results for orders placed in visit on 01/27/12 (from the past 8736 hour(s))  HEMOGLOBIN A1C   Collection Time    01/27/12 12:38 PM      Result Value Range   Hemoglobin A1C 6.0 (*) <5.7 %   Mean Plasma Glucose 126 (*) <117 mg/dL  CBC WITH DIFFERENTIAL   Collection Time    01/27/12 12:38 PM      Result Value Range   WBC 6.2  4.0 - 10.5 K/uL   RBC 4.17  3.87 - 5.11 MIL/uL   Hemoglobin 11.9 (*) 12.0 - 15.0 g/dL   HCT 16.1  09.6 - 04.5 %   MCV 87.8  78.0 - 100.0 fL   MCH 28.5  26.0 - 34.0 pg   MCHC 32.5  30.0 - 36.0 g/dL   RDW 40.9  81.1 - 91.4 %   Platelets 229  150 - 400 K/uL   Neutrophils Relative 59  43 - 77 %   Neutro Abs 3.7  1.7 - 7.7 K/uL   Lymphocytes Relative 30  12 - 46 %   Lymphs Abs 1.8  0.7 - 4.0 K/uL   Monocytes Relative 10  3 - 12 %   Monocytes Absolute 0.6  0.1 - 1.0 K/uL   Eosinophils Relative 1  0 - 5 %   Eosinophils Absolute 0.1  0.0 - 0.7 K/uL   Basophils Relative 0  0 - 1 %   Basophils Absolute 0.0  0.0 - 0.1 K/uL   Smear Review Criteria for review not met    COMPREHENSIVE METABOLIC PANEL   Collection Time    01/27/12 12:38 PM      Result Value Range   Sodium 138  135 - 145 mEq/L   Potassium 4.2  3.5 - 5.3 mEq/L   Chloride 104  96 - 112 mEq/L   CO2 25  19 - 32 mEq/L   Glucose, Bld 83  70 - 99 mg/dL   BUN 15  6 - 23 mg/dL   Creat 7.82  9.56 - 2.13 mg/dL   Total Bilirubin 0.5  0.3 - 1.2 mg/dL   Alkaline Phosphatase 65  39 - 117 U/L   AST 21  0 - 37 U/L    ALT 22  0 - 35 U/L   Total Protein 6.8  6.0 - 8.3 g/dL   Albumin 4.3  3.5 - 5.2 g/dL   Calcium 8.9  8.4 - 08.6 mg/dL  Labs typically drawn from this office.  Will get annual  lab when it is expected that the Vitamin has come up to make one draw do it all.  Assessment. Axis I mood disorder NOS, bipolar disorder 1, posttraumatic stress disorder, r/o Major depressive disorder Axis II rule out borderline personality trait. Axis III obesity, chronic headache. Axis IV moderate. Axis V 60-65.  Plan/Discussion: I took her vitals.  I reviewed CC, tobacco/med/surg Hx, meds effects/ side effects, problem list, therapies and responses as well as current situation/symptoms discussed options. Temporarily stop Prozac, then gradually add back, Increase and spread out Geodon through the day and increase Xanax as needed, bump Lamictal up some, and add Trazodone for insomnia. See orders and pt instructions for more details.  Medical Decision Making Problem Points:  Established problem, worsening (2), Review of last therapy session (1), Review of psycho-social stressors (1) and Self-limited or minor (1) Data Points:  Review or order clinical lab tests (1) Review of medication regiment & side effects (2) Review of new medications or change in dosage (2)  I certify that outpatient services furnished can reasonably be expected to improve the patient's condition.   Orson Aloe, MD, [facility]  Addendum:  02/07/2013 Labs indicated Lithium low at 0.3 and Vitamin D very low at <8.  Called number listed as home number.  Answering machine did not identify owner.  Left message that this was Dr Lacretia Nicks and that someone had some labs that needed to be discussed and invited them to call back. Orson Aloe, MD, [facility]

## 2013-02-05 LAB — VITAMIN D 1,25 DIHYDROXY: Vitamin D3 1, 25 (OH)2: 8 pg/mL

## 2013-02-09 ENCOUNTER — Telehealth (HOSPITAL_COMMUNITY): Payer: Self-pay | Admitting: Psychiatry

## 2013-02-09 DIAGNOSIS — F319 Bipolar disorder, unspecified: Secondary | ICD-10-CM

## 2013-02-09 NOTE — Telephone Encounter (Signed)
S/W pt explaining that her labs are still critically low on the Vitamin D even though she is still on the replacement.  She feels that the Lithium desired effect could be better.  Will double and see next week.

## 2013-02-14 ENCOUNTER — Ambulatory Visit (HOSPITAL_COMMUNITY): Payer: Self-pay | Admitting: Psychiatry

## 2013-02-19 ENCOUNTER — Encounter (HOSPITAL_COMMUNITY): Payer: Self-pay | Admitting: Psychiatry

## 2013-02-19 ENCOUNTER — Ambulatory Visit (INDEPENDENT_AMBULATORY_CARE_PROVIDER_SITE_OTHER): Payer: 59 | Admitting: Psychiatry

## 2013-02-19 VITALS — Wt 216.6 lb

## 2013-02-19 DIAGNOSIS — F515 Nightmare disorder: Secondary | ICD-10-CM

## 2013-02-19 DIAGNOSIS — F4310 Post-traumatic stress disorder, unspecified: Secondary | ICD-10-CM

## 2013-02-19 DIAGNOSIS — F39 Unspecified mood [affective] disorder: Secondary | ICD-10-CM

## 2013-02-19 DIAGNOSIS — F419 Anxiety disorder, unspecified: Secondary | ICD-10-CM

## 2013-02-19 DIAGNOSIS — F5105 Insomnia due to other mental disorder: Secondary | ICD-10-CM

## 2013-02-19 DIAGNOSIS — F319 Bipolar disorder, unspecified: Secondary | ICD-10-CM

## 2013-02-19 DIAGNOSIS — T50905A Adverse effect of unspecified drugs, medicaments and biological substances, initial encounter: Secondary | ICD-10-CM

## 2013-02-19 DIAGNOSIS — E559 Vitamin D deficiency, unspecified: Secondary | ICD-10-CM | POA: Insufficient documentation

## 2013-02-19 MED ORDER — ZIPRASIDONE HCL 60 MG PO CAPS: 60.0000 mg | ORAL_CAPSULE | Freq: Three times a day (TID) | ORAL | Status: DC

## 2013-02-19 MED ORDER — ALPRAZOLAM 0.5 MG PO TABS: ORAL_TABLET | ORAL | Status: DC

## 2013-02-19 MED ORDER — BUPROPION HCL ER (SR) 100 MG PO TB12: 100.0000 mg | ORAL_TABLET | Freq: Two times a day (BID) | ORAL | Status: DC

## 2013-02-19 MED ORDER — LAMOTRIGINE 200 MG PO TABS: 200.0000 mg | ORAL_TABLET | Freq: Two times a day (BID) | ORAL | Status: DC

## 2013-02-19 MED ORDER — CALCIUM CARBONATE-VITAMIN D 500-200 MG-UNIT PO TABS: 1.0000 | ORAL_TABLET | Freq: Two times a day (BID) | ORAL | Status: DC

## 2013-02-19 MED ORDER — PRAZOSIN HCL 1 MG PO CAPS: ORAL_CAPSULE | ORAL | Status: DC

## 2013-02-19 MED ORDER — ERGOCALCIFEROL 1.25 MG (50000 UT) PO CAPS: 50000.0000 [IU] | ORAL_CAPSULE | ORAL | Status: DC

## 2013-02-19 MED ORDER — LITHIUM CARBONATE 300 MG PO CAPS: 300.0000 mg | ORAL_CAPSULE | Freq: Two times a day (BID) | ORAL | Status: DC

## 2013-02-19 MED ORDER — HYDROXYZINE PAMOATE 50 MG PO CAPS: 50.0000 mg | ORAL_CAPSULE | Freq: Four times a day (QID) | ORAL | Status: DC | PRN

## 2013-02-19 MED ORDER — TRAZODONE HCL 100 MG PO TABS: 50.0000 mg | ORAL_TABLET | Freq: Every day | ORAL | Status: DC

## 2013-02-19 MED ORDER — FLUOXETINE HCL 20 MG PO CAPS: ORAL_CAPSULE | ORAL | Status: DC

## 2013-02-19 NOTE — Patient Instructions (Addendum)
Keep the exercise and diet things up.  CUT BACK/CUT OUT on sugar and carbohydrates, that means very limited fruits and starchy vegetables and very limited grains, breads  The goal is low GLYCEMIC INDEX.  CUT OUT all wheat, rye, or barley for the GLUTEN in them.  HIGH fat and LOW carbohydrate diet is the KEY.  Eat avocados, eggs, lean meat like grass fed beef and chicken  Nuts and seeds would be good foods as well.   Stevia is an excellent sweetener.  Safe for the brain.   Lowella Grip is also a good safe sweetener, not the baking blend form of Truvia  Almond butter is awesome.  Check out all this on the Internet.  Dr Heber [place] is on the Internet with some good info about this.   http://www.drperlmutter.com is where that is.  An excellent site for info on this diet is http://paleoleap.com  [employer] makes dark chocolate that is sweetened with Stevia that is safe.  Relaxation is the ultimate solution for you.  You can seek it through tub baths, bubble baths, essential oils or incense, walking or chatting with friends, listening to soft music, watching a candle burn and just letting all thoughts go and appreciating the true essence of the Creator.  Pets or animals may be very helpful.  You might spend some time with them and then go do more directed meditation.  "I am Wishes Fulfilled Meditation" by Marylene Buerger and Lyndal Pulley may be helpful MUSIC for getting to sleep or for meditating You can order it from on line.  You might find the Chill channel on Pandora and explore the artists that you like better.   Take care of yourself.  No one else is standing up to do the job and only you know what you need.   GET SERIOUS about taking care of yourself.  Do the next right thing and that often means doing something to care for yourself along the lines of are you hungry, are you angry, are you lonely, are you tired, are you scared?  HALTS is what that stands for.  Call if problems  or concerns.

## 2013-02-19 NOTE — Progress Notes (Addendum)
[facility] [id number] Progress Note Shalae Belmonte MRN: [id number] DOB: 12-Feb-1981 Age: 32 y.o.  Date: 02/19/2013 Start Time: 9:30 AM End Time: 10:05 AM  Chief Complaint: Chief Complaint  Patient presents with  . Depression  . Follow-up  . Medication Refill   Subjective: "I am dreaming a lot about past traumatic events and not getting good rest". Depression 6 or 7/10. Mania 0/10 today and Anxiety 7/10, where 1 is the best and 10 is the worst.  Pain is 0/10. Typically wakes with a headache in the back of the head radiating to the front.  History of present illness Patient is 32 year old Caucasian separated female who came for her followup appointment.  Pt reports that she is compliant with the psychotropic medications with fair benefit and some side effects.  She sometimes feels disoriented and drowsy when she takes the medicine.  She asks about adding back the full dose of Prozac.  Will do that, plus increase the Lithium to 300 mg BID and continue the Vitamin D. She has been applying some of the methods to reduce her weight and was successful for a 5 pound weight loss.  She had an urge yesterday to cut on herself and she avoided engaging in that.  She took a Xanax and laid down to avert that urge.  She is noting the bad dreams which predated the Trazodone, but will offer Minipress for better control of the bad dreams.  Current psychiatric medication. Xanax 0.5 mg 5 times a day  Vistaril 50 mg 2 to 3 times a day Lamictal 200 mg tablet twice daily Geodon 60 mg with meals Prozac 40 mg daily Wellbutrin SR 100 mg twice a day Trazodone 200 mg at night Vitamin D 50,000 IU every Sunday OsCal twice a day  Past psychiatric history. Patient has history of significant depression and mood swings. She has at least 4 psychiatric admissions in past.  In 2000-2003 she was admitted due to cutting herself and suicidal thinking. In the past. She had tried Geodon, Depakote, Lamictal, Prozac, Abilify,  Lexapro, Celexa, Effexor, Wellbutrin, BuSpar, Paxil, Risperdal, Ativan and Klonopin. She admitted history of anger, severe mood swings, and irritability.  Her last psychiatric admission was in June 2013.  Psychosocial history. Patient was born and lived in [place]. She's been married twice. She has one son from her previous marriage and a daughter from her second marriage. She used to live by herself however recently her husband move-in and she is working on her marriage.    Medical history. Patient has obesity, and chronic migraine headache. She does not have any recent blood work. She is taking Imitrex as needed for headache.  Her blood work in March 2013 showed hemoglobin A1c is 6 however her liver enzymes and kidney functions are normal.  Her primary care physician is [facility] internal medicine.    Family history. Patient endorsed significant history of depression anxiety in her family. Her brother, sister and father has depression and anxiety. family history includes Anxiety disorder in her brother, father, and sister; Cancer - Other in her paternal grandfather; and Depression in her brother, father, and sister.  There is no history of ADD / ADHD, and Alcohol abuse, and Drug abuse, and Bipolar disorder, and Dementia, and OCD, and Paranoid behavior, and Schizophrenia, and Seizures, and Sexual abuse, and Physical abuse, .  Education background and work history. She has bachelor degree in Presenter, broadcasting.  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 She quit  her job in December 2012 .  She worked briefly at [employer] but quit her job due to feeling overwhelmed.  She recently worked at [employer] as a Conservation officer, nature for 2 weeks only.    Alcohol and substance use history. Patient denies any history of alcohol or substance use. She denies a history of DWI.  She has used Xanax for two years and is not willing to stop this for Neurontin for her anxiety despite noting memory  loss with the use of Xanax.  Mental status examination. Patient is a obese, female, who is casually dressed. She is anxious and tearful.  She described her mood is sad nervous and her affect is constricted.  She maintained fair eye contact.  She denies any active or passive suicidal thoughts or homicidal thoughts.  She denies any auditory hallucination or visual hallucination.  She endorse some paranoia about people however she denies any obsessive thoughts or delusions.  There were no flight of ideas or loose association .  Her attention and concentration is fair.  She's alert and oriented x3. Her insight judgment and impulse control is fair.  Lab Results:  Results for orders placed in visit on 01/24/13 (from the past 8736 hour(s))  LITHIUM LEVEL   Collection Time    02/02/13 11:45 AM      Result Value Range   Lithium Lvl 0.30 (*) 0.80 - 1.40 mEq/L  VITAMIN D 1,25 DIHYDROXY   Collection Time    02/02/13 11:45 AM      Result Value Range   Vitamin D 1, 25 (OH) Total 8 (*) 18 - 72 pg/mL   Vitamin D3 1, 25 (OH) 8     Vitamin D2 1, 25 (OH) <8    Results for orders placed in visit on 09/08/12 (from the past 8736 hour(s))  VITAMIN D 25 HYDROXY   Collection Time    09/08/12 12:28 PM      Result Value Range   Vit D, 25-Hydroxy 17 (*) 30 - 89 ng/mL   Assessment. Axis I mood disorder NOS, bipolar disorder 1, posttraumatic stress disorder, r/o Major depressive disorder Axis II rule out borderline personality trait. Axis III obesity, chronic headache. Axis IV moderate. Axis V 60-65.  Plan/Discussion: I took her vitals.  I reviewed CC, tobacco/med/surg Hx, meds effects/ side effects, problem list, therapies and responses as well as current situation/symptoms discussed options. Add back the Prozac and keep on the increased dose of Lithium.  Keep the weight reduction methods up. See orders and pt instructions for more details.  Medical Decision Making Problem Points:  Established problem,  worsening (2), Review of last therapy session (1) and Review of psycho-social stressors (1) Data Points:  Review or order clinical lab tests (1) Review of medication regiment & side effects (2) Review of new medications or change in dosage (2)  I certify that outpatient services furnished can reasonably be expected to improve the patient's condition.   Orson Aloe, MD, [facility]  Addendum:  03/09/2013 Have tried three times to call pt with results of Lithium, but each time the message heard from the phone call is that the person called is unavailable.  Orson Aloe, MD, [facility]

## 2013-03-08 LAB — LITHIUM LEVEL: Lithium Lvl: 1 mEq/L (ref 0.80–1.40)

## 2013-03-12 ENCOUNTER — Telehealth (HOSPITAL_COMMUNITY): Payer: Self-pay | Admitting: Psychiatry

## 2013-03-13 ENCOUNTER — Telehealth (HOSPITAL_COMMUNITY): Payer: Self-pay

## 2013-03-13 NOTE — Telephone Encounter (Signed)
I got a fax tone when I called the listed home phone number today.  Left word at the front desk that the blood level was 1.0 in range for when she calls back.

## 2013-03-19 ENCOUNTER — Ambulatory Visit (HOSPITAL_COMMUNITY): Payer: Self-pay | Admitting: Psychiatry

## 2013-03-23 ENCOUNTER — Telehealth (HOSPITAL_COMMUNITY): Payer: Self-pay | Admitting: Psychiatry

## 2013-03-23 DIAGNOSIS — F319 Bipolar disorder, unspecified: Secondary | ICD-10-CM

## 2013-03-23 MED ORDER — ALPRAZOLAM 0.5 MG PO TABS: 0.5000 mg | ORAL_TABLET | Freq: Three times a day (TID) | ORAL | Status: DC | PRN

## 2013-03-23 NOTE — Telephone Encounter (Signed)
Message copied by Elaina Pattee on Fri Mar 23, 2013 11:42 AM ------      Message from: Gerilyn Pilgrim      Created: Fri Mar 23, 2013 10:53 AM      Regarding: DR. Tilman Neat PATIENT       PATIENT REQUEST REFILL OF XANAX 25 MG. USES [facility].        ------

## 2013-04-03 ENCOUNTER — Ambulatory Visit (INDEPENDENT_AMBULATORY_CARE_PROVIDER_SITE_OTHER): Payer: 59 | Admitting: Psychiatry

## 2013-04-03 ENCOUNTER — Encounter (HOSPITAL_COMMUNITY): Payer: Self-pay | Admitting: Psychiatry

## 2013-04-03 VITALS — BP 115/76 | HR 72 | Ht 59.25 in | Wt 211.0 lb

## 2013-04-03 DIAGNOSIS — F319 Bipolar disorder, unspecified: Secondary | ICD-10-CM

## 2013-04-03 DIAGNOSIS — E559 Vitamin D deficiency, unspecified: Secondary | ICD-10-CM

## 2013-04-03 DIAGNOSIS — F5105 Insomnia due to other mental disorder: Secondary | ICD-10-CM

## 2013-04-03 DIAGNOSIS — F39 Unspecified mood [affective] disorder: Secondary | ICD-10-CM

## 2013-04-03 DIAGNOSIS — R635 Abnormal weight gain: Secondary | ICD-10-CM

## 2013-04-03 DIAGNOSIS — T50905A Adverse effect of unspecified drugs, medicaments and biological substances, initial encounter: Secondary | ICD-10-CM

## 2013-04-03 DIAGNOSIS — F515 Nightmare disorder: Secondary | ICD-10-CM

## 2013-04-03 DIAGNOSIS — F4310 Post-traumatic stress disorder, unspecified: Secondary | ICD-10-CM

## 2013-04-03 MED ORDER — LITHIUM CARBONATE 300 MG PO CAPS: 300.0000 mg | ORAL_CAPSULE | Freq: Every day | ORAL | Status: DC

## 2013-04-03 MED ORDER — TRAZODONE HCL 100 MG PO TABS: 200.0000 mg | ORAL_TABLET | Freq: Every day | ORAL | Status: DC

## 2013-04-03 MED ORDER — LITHIUM CARBONATE ER 450 MG PO TBCR: 450.0000 mg | EXTENDED_RELEASE_TABLET | Freq: Every day | ORAL | Status: DC

## 2013-04-03 MED ORDER — FLUOXETINE HCL 20 MG PO CAPS: ORAL_CAPSULE | ORAL | Status: DC

## 2013-04-03 MED ORDER — PRAZOSIN HCL 1 MG PO CAPS: 3.0000 mg | ORAL_CAPSULE | Freq: Every day | ORAL | Status: DC

## 2013-04-03 MED ORDER — LAMOTRIGINE 200 MG PO TABS: 200.0000 mg | ORAL_TABLET | Freq: Two times a day (BID) | ORAL | Status: DC

## 2013-04-03 MED ORDER — BUPROPION HCL ER (SR) 100 MG PO TB12: 100.0000 mg | ORAL_TABLET | Freq: Two times a day (BID) | ORAL | Status: DC

## 2013-04-03 MED ORDER — ALPRAZOLAM 0.5 MG PO TABS: 0.5000 mg | ORAL_TABLET | Freq: Three times a day (TID) | ORAL | Status: DC | PRN

## 2013-04-03 NOTE — Patient Instructions (Signed)
Stop Vistaril today  Keep Litihium the same for 3 days then substitute the [place] for supper dose then  I will forward instructions on Latuda titration today.  Take care of yourself.  No one else is standing up to do the job and only you know what you need.   GET SERIOUS about taking care of yourself.  Do the next right thing and that often means doing something to care for yourself along the lines of are you hungry, are you angry, are you lonely, are you tired, are you scared?  HALTS is what that stands for.  Call if problems or concerns.

## 2013-04-03 NOTE — Progress Notes (Signed)
[facility] [id number] Progress Note Teresa Tran MRN: [id number] DOB: [birth year] Age: 32 y.o.  Date: 04/03/2013 Start Time: 11:15 AM End Time: 11:43 AM  Chief Complaint: Chief Complaint  Patient presents with  . Depression  . Follow-up  . Medication Reaction  . Medication Refill   Subjective: "I've at least lost 5 more pounds.  The nightmares are better on the Minipress and I am still dreaming a lot". Depression 7/10. Mania 0/10 today and Anxiety 8/10, where 1 is the best and 10 is the worst.  Pain is 0/10.   History of present illness Patient is 32 year old Caucasian separated female who came for her followup appointment.  Pt reports that she is compliant with the psychotropic medications with fair benefit and some side effects.  She is noting that she is unsteady and can't keep her balance.  She is groggy and stays in bed most of the day.  She describes feeling miserable with that.  She feels that the Vistaril is making her have dry mouth and making her unsteady.  It may have helped her headaches, but the side effects and the continued depression.  She is not driving with the performance that she would like with the AM effect with the medication combination.  Current psychiatric medication. Xanax 0.5 mg 5 times a day  Vistaril 50 mg 2 to 3 times a day Lamictal 200 mg tablet twice daily Geodon 60 mg with meals Prozac 40 mg daily Wellbutrin SR 100 mg twice a day Trazodone 200 mg at night Vitamin D 50,000 IU every Sunday [place] twice a day  Past psychiatric history. Patient has history of significant depression and mood swings. She has at least 4 psychiatric admissions in past.  In 2000-2003 she was admitted due to cutting herself and suicidal thinking. In the past. She had tried Geodon, Depakote, Lamictal, Prozac, Abilify, Lexapro, Celexa, Effexor, Wellbutrin, BuSpar, Paxil, Risperdal, Ativan and Klonopin. She admitted history of anger, severe mood swings, and irritability.   Her last psychiatric admission was in June 2013.  Psychosocial history. Patient was born and lived in [place]. She's been married twice. She has one son from her previous marriage and a daughter from her second marriage. She used to live by herself however recently her husband move-in and she is working on her marriage.   Allergies: Allergies  Allergen Reactions  . Risperdal (Risperidone) Other (See Comments)    Wt gain    Medical History: Past Medical History  Diagnosis Date  . Headache   . Bipolar disorder   . Depression   Patient has obesity, and chronic migraine headache. She does not have any recent blood work. She is taking Imitrex as needed for headache.  Her blood work in March 2013 showed hemoglobin A1c is 6 however her liver enzymes and kidney functions are normal.  Her primary care physician is [facility] internal medicine.    Surgical History: Past Surgical History  Procedure Laterality Date  . Tubal ligation     Family history. Patient endorsed significant history of depression anxiety in her family. Her brother, sister and father has depression and anxiety. family history includes Anxiety disorder in her brother, father, and sister; Cancer - Other in her paternal grandfather; and Depression in her brother, father, and sister.  There is no history of ADD / ADHD, and Alcohol abuse, and Drug abuse, and Bipolar disorder, and Dementia, and OCD, and Paranoid behavior, and Schizophrenia, and Seizures, and Sexual abuse, and Physical abuse, . Reviewed all this  again today in the session and nothing has changed.  Education background and work history. She has bachelor degree in Presenter, broadcasting.  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  She worked briefly at [employer] but quit her job due to feeling overwhelmed.  She recently worked at [employer] as a Conservation officer, nature for 2 weeks only.    Alcohol and substance use  history. Patient denies any history of alcohol or substance use. She denies a history of DWI.  She has used Xanax for two years and is not willing to stop this for Neurontin for her anxiety despite noting memory loss with the use of Xanax.  Mental status examination. Patient is a obese, female, who is casually dressed. She is anxious and tearful.  She described her mood is sad nervous and her affect is constricted.  She maintained fair eye contact.  She denies any active or passive suicidal thoughts or homicidal thoughts.  She denies any auditory hallucination or visual hallucination.  She endorse some paranoia about people however she denies any obsessive thoughts or delusions.  There were no flight of ideas or loose association .  Her attention and concentration is fair.  She's alert and oriented x3. Her insight judgment and impulse control is fair.  Lab Results:  Results for orders placed in visit on 02/19/13 (from the past 8736 hour(s))  LITHIUM LEVEL   Collection Time    03/07/13 12:00 PM      Result Value Range   Lithium Lvl 1.00  0.80 - 1.40 mEq/L  Results for orders placed in visit on 01/24/13 (from the past 8736 hour(s))  LITHIUM LEVEL   Collection Time    02/02/13 11:45 AM      Result Value Range   Lithium Lvl 0.30 (*) 0.80 - 1.40 mEq/L  VITAMIN D 1,25 DIHYDROXY   Collection Time    02/02/13 11:45 AM      Result Value Range   Vitamin D 1, 25 (OH) Total 8 (*) 18 - 72 pg/mL   Vitamin D3 1, 25 (OH) 8     Vitamin D2 1, 25 (OH) <8    Results for orders placed in visit on 09/08/12 (from the past 8736 hour(s))  VITAMIN D 25 HYDROXY   Collection Time    09/08/12 12:28 PM      Result Value Range   Vit D, 25-Hydroxy 17 (*) 30 - 89 ng/mL   Assessment. Axis I mood disorder NOS, bipolar disorder 1, posttraumatic stress disorder, r/o Major depressive disorder Axis II rule out borderline personality trait. Axis III obesity, chronic headache. Axis IV moderate. Axis V  60-65.  Plan/Discussion: I took her vitals.  I reviewed CC, tobacco/med/surg Hx, meds effects/ side effects, problem list, therapies and responses as well as current situation/symptoms discussed options. Stop Vistaril as it is ineffective and Increase Lithium for better anti depression action and Switch to [facility] for different mood stabilization, hoping better than Geodon. See orders and pt instructions for more details.  MEDICATIONS this encounter: Meds ordered this encounter  Medications  . traZODone (DESYREL) 100 MG tablet    Sig: Take 2 tablets (200 mg total) by mouth at bedtime.    Dispense:  60 tablet    Refill:  1  . prazosin (MINIPRESS) 1 MG capsule    Sig: Take 3 capsules (3 mg total) by mouth at bedtime.    Dispense:  90 capsule  Refill:  1  . lithium carbonate 300 MG capsule    Sig: Take 1 capsule (300 mg total) by mouth daily with breakfast.    Dispense:  30 capsule    Refill:  1  . lithium carbonate (ESKALITH) 450 MG CR tablet    Sig: Take 1 tablet (450 mg total) by mouth daily with supper.    Dispense:  30 tablet    Refill:  1  . FLUoxetine (PROZAC) 20 MG capsule    Sig: Three in the evening    Dispense:  90 capsule    Refill:  1  . lamoTRIgine (LAMICTAL) 200 MG tablet    Sig: Take 1 tablet (200 mg total) by mouth 2 (two) times daily.    Dispense:  60 tablet    Refill:  1  . buPROPion (WELLBUTRIN SR) 100 MG 12 hr tablet    Sig: Take 1 tablet (100 mg total) by mouth 2 (two) times daily. One in AM and One at 4 PM    Dispense:  60 tablet    Refill:  1  . ALPRAZolam (XANAX) 0.5 MG tablet    Sig: Take 1 tablet (0.5 mg total) by mouth 3 (three) times daily as needed for anxiety.    Dispense:  90 tablet    Refill:  0   Medical Decision Making Problem Points:  Established problem, worsening (2), Review of last therapy session (1) and Review of psycho-social stressors (1) Data Points:  Review or order clinical lab tests (1) Review of medication regiment & side  effects (2) Review of new medications or change in dosage (2)  I certify that outpatient services furnished can reasonably be expected to improve the patient's condition.   Orson Aloe, MD, [facility]

## 2013-04-04 ENCOUNTER — Telehealth (HOSPITAL_COMMUNITY): Payer: Self-pay | Admitting: Psychiatry

## 2013-04-04 DIAGNOSIS — F319 Bipolar disorder, unspecified: Secondary | ICD-10-CM

## 2013-04-04 MED ORDER — LURASIDONE HCL 80 MG PO TABS: ORAL_TABLET | ORAL | Status: DC

## 2013-04-04 MED ORDER — ZIPRASIDONE HCL 60 MG PO CAPS
ORAL_CAPSULE | ORAL | Status: DC
Start: 2013-04-04 — End: 2013-05-17

## 2013-04-04 NOTE — Telephone Encounter (Signed)
Consulted with pharmacist and ir was decided to cross taper with Latuda as per instructions.

## 2013-05-01 ENCOUNTER — Ambulatory Visit (HOSPITAL_COMMUNITY): Payer: Self-pay | Admitting: Psychiatry

## 2013-05-10 ENCOUNTER — Ambulatory Visit (HOSPITAL_COMMUNITY): Payer: Self-pay | Admitting: Psychiatry

## 2013-05-10 ENCOUNTER — Telehealth (HOSPITAL_COMMUNITY): Payer: Self-pay | Admitting: Psychiatry

## 2013-05-10 DIAGNOSIS — F4310 Post-traumatic stress disorder, unspecified: Secondary | ICD-10-CM

## 2013-05-10 DIAGNOSIS — F319 Bipolar disorder, unspecified: Secondary | ICD-10-CM

## 2013-05-10 MED ORDER — LITHIUM CARBONATE ER 450 MG PO TBCR: 450.0000 mg | EXTENDED_RELEASE_TABLET | Freq: Every day | ORAL | Status: DC

## 2013-05-10 MED ORDER — FLUOXETINE HCL 20 MG PO CAPS: ORAL_CAPSULE | ORAL | Status: DC

## 2013-05-10 MED ORDER — ALPRAZOLAM 0.5 MG PO TABS: 0.5000 mg | ORAL_TABLET | Freq: Three times a day (TID) | ORAL | Status: DC | PRN

## 2013-05-10 MED ORDER — LITHIUM CARBONATE 300 MG PO CAPS: 300.0000 mg | ORAL_CAPSULE | Freq: Every day | ORAL | Status: DC

## 2013-05-10 NOTE — Telephone Encounter (Signed)
Able to contact the patient today.  Patient did not come for her appointment.  She was not feeling well.  She is requesting medication.  She needs a refill of Prozac, Xanax, lithium.  She is taking Latuda 80 mg 1-1/2 tablet daily.  She has refill remaining on her trazodone.  I requested not to take Geodon since she is taking the new antipsychotic medication.  Patient is scheduled to see this Clinical research associate next week.  I will give 1 week refill for her Prozac Xanax and lithium.

## 2013-05-10 NOTE — Telephone Encounter (Signed)
Patient is taking too many medication from Dr. Dan Humphreys.  Her medications were also adjusted on her last visit.  She's taking to antipsychotic medication however she was recommended to stop Geodon on her last visit.  Patient is requesting refills however she did not come on her appointment.  I recommend to see psychiatrist for medication management and to get refills.

## 2013-05-17 ENCOUNTER — Encounter (HOSPITAL_COMMUNITY): Payer: Self-pay | Admitting: Psychiatry

## 2013-05-17 ENCOUNTER — Ambulatory Visit (INDEPENDENT_AMBULATORY_CARE_PROVIDER_SITE_OTHER): Payer: 59 | Admitting: Psychiatry

## 2013-05-17 VITALS — BP 128/76 | HR 84 | Wt 206.0 lb

## 2013-05-17 DIAGNOSIS — F39 Unspecified mood [affective] disorder: Secondary | ICD-10-CM

## 2013-05-17 DIAGNOSIS — F319 Bipolar disorder, unspecified: Secondary | ICD-10-CM

## 2013-05-17 DIAGNOSIS — F5105 Insomnia due to other mental disorder: Secondary | ICD-10-CM

## 2013-05-17 DIAGNOSIS — F4310 Post-traumatic stress disorder, unspecified: Secondary | ICD-10-CM

## 2013-05-17 MED ORDER — PRAZOSIN HCL 1 MG PO CAPS: 3.0000 mg | ORAL_CAPSULE | Freq: Every day | ORAL | Status: DC

## 2013-05-17 MED ORDER — BENZTROPINE MESYLATE 0.5 MG PO TABS: 1.0000 mg | ORAL_TABLET | Freq: Every day | ORAL | Status: DC

## 2013-05-17 MED ORDER — TRAZODONE HCL 150 MG PO TABS: 150.0000 mg | ORAL_TABLET | Freq: Every day | ORAL | Status: DC

## 2013-05-17 MED ORDER — FLUOXETINE HCL 20 MG PO CAPS: ORAL_CAPSULE | ORAL | Status: DC

## 2013-05-17 MED ORDER — ALPRAZOLAM 0.5 MG PO TABS: 0.5000 mg | ORAL_TABLET | Freq: Three times a day (TID) | ORAL | Status: DC | PRN

## 2013-05-17 MED ORDER — LITHIUM CARBONATE 300 MG PO CAPS: 300.0000 mg | ORAL_CAPSULE | Freq: Two times a day (BID) | ORAL | Status: DC

## 2013-05-17 MED ORDER — HALOPERIDOL 5 MG PO TABS: 5.0000 mg | ORAL_TABLET | Freq: Every day | ORAL | Status: DC

## 2013-05-17 NOTE — Progress Notes (Signed)
[facility] [id number] Progress Note Teresa Tran MRN: [id number] DOB: 1981-03-03 Age: 32 y.o.  Date: 05/17/2013  Chief Complaint: Chief Complaint  Patient presents with  . Follow-up   Subjective: "I lost my Medicaid.  I cannot afford that many medication".  History of present illness Patient is 32 year old Caucasian separated female who came for her followup appointment.  Patient reported that she has lost her Medicaid and she cannot afford so many medication.  On her last visit Dr. Dan Humphreys started her on latuda with cross taper of Geodon because patient was complaining of nightmares.  She is also taking Minipress it is helping her nightmares.  Patient continues to have chronic depression and lack of motivation.  She continues to engage in self abusive behavior by cutting herself.  She admitted 3 weeks ago she cut superficially but denies any active or passive suicidal thoughts or homicidal thoughts.  She denies any hallucination or any paranoia.  She wants to cut down some of her psychiatric medication because she no longer afford the medication.  She's not seeing therapist.  She admitted sometimes she has dizziness and cannot keep her balance very well which could be due to to polypharmacy.  She's not drinking or using any illegal substance.  She denies any tremors or shakes.  Past psychiatric history. Patient has history of significant depression and mood swings. She has at least 4 psychiatric admissions in past.  In 2000-2003 she was admitted due to cutting herself and suicidal thinking. In the past. She had tried Geodon, Depakote, Lamictal, Prozac, Abilify, Lexapro, Celexa, Effexor, Wellbutrin, BuSpar, Paxil, Risperdal, Ativan and Klonopin. She admitted history of anger, severe mood swings, and irritability.  Her last psychiatric admission was in June 2013.  Psychosocial history. Patient was born and lived in [place]. She's been married twice. She has one son from her previous  marriage and a daughter from her second marriage. She used to live by herself however recently her husband move-in and she is working on her marriage.   Allergies: Allergies  Allergen Reactions  . Risperdal (Risperidone) Other (See Comments)    Wt gain    Medical History: Past Medical History  Diagnosis Date  . Headache(784.0)   . Bipolar disorder   . Depression   Patient has obesity, and chronic migraine headache. She does not have any recent blood work. She is taking Imitrex as needed for headache.  Her blood work in March 2013 showed hemoglobin A1c is 6 however her liver enzymes and kidney functions are normal.  Her primary care physician is [facility] internal medicine.    Surgical History: Past Surgical History  Procedure Laterality Date  . Tubal ligation     Family history. Patient endorsed significant history of depression anxiety in her family. Her brother, sister and father has depression and anxiety. family history includes Anxiety disorder in her brother, father, and sister; Cancer - Other in her paternal grandfather; and Depression in her brother, father, and sister.  There is no history of ADD / ADHD, and Alcohol abuse, and Drug abuse, and Bipolar disorder, and Dementia, and OCD, and Paranoid behavior, and Schizophrenia, and Seizures, and Sexual abuse, and Physical abuse, . Reviewed all this again today in the session and nothing has changed.  Education background and work history. She has bachelor degree in Presenter, broadcasting.  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  She worked briefly at [employer] but quit  her job due to feeling overwhelmed.  She recently worked at [employer] as a Conservation officer, nature for 2 weeks only.    Alcohol and substance use history. Patient denies any history of alcohol or substance use. She denies a history of DWI.  She has used Xanax for two years and is not willing to stop this for Neurontin for  her anxiety despite noting memory loss with the use of Xanax.  Filed Vitals:   05/17/13 1258  BP: 128/76  Pulse: 84    Mental status examination. Patient is a obese, female, who is casually dressed. She is anxious but cooperative.  She described her mood is sad nervous and her affect is constricted.  She maintained fair eye contact.  She denies any active or passive suicidal thoughts or homicidal thoughts.  She denies any auditory hallucination or visual hallucination.  She endorse some paranoia about people however she denies any obsessive thoughts or delusions.  There were no flight of ideas or loose association .  Her attention and concentration is fair.  She's alert and oriented x3. Her insight judgment and impulse control is fair.  Lab Results:  Results for orders placed in visit on 02/19/13 (from the past 8736 hour(s))  LITHIUM LEVEL   Collection Time    03/07/13 12:00 PM      Result Value Range   Lithium Lvl 1.00  0.80 - 1.40 mEq/L  Results for orders placed in visit on 01/24/13 (from the past 8736 hour(s))  LITHIUM LEVEL   Collection Time    02/02/13 11:45 AM      Result Value Range   Lithium Lvl 0.30 (*) 0.80 - 1.40 mEq/L  VITAMIN D 1,25 DIHYDROXY   Collection Time    02/02/13 11:45 AM      Result Value Range   Vitamin D 1, 25 (OH) Total 8 (*) 18 - 72 pg/mL   Vitamin D3 1, 25 (OH) 8     Vitamin D2 1, 25 (OH) <8    Results for orders placed in visit on 09/08/12 (from the past 8736 hour(s))  VITAMIN D 25 HYDROXY   Collection Time    09/08/12 12:28 PM      Result Value Range   Vit D, 25-Hydroxy 17 (*) 30 - 89 ng/mL   Assessment. Axis I mood disorder NOS, bipolar disorder 1, posttraumatic stress disorder, r/o Major depressive disorder Axis II rule out borderline personality trait. Axis III obesity, chronic headache. Axis IV moderate. Axis V 60-65.  Plan/Discussion: I review her history and previous notes.  She was taking Lexapro which was discontinued.  She was  taking Seroquel which was discontinued.  She's not taking at least 3 antidepressant but limited response.  She admitted complaining of dizziness and balance issue.  She's no longer afford all these medication.  I will discontinue Wellbutrin, Geodon, Latuda and Vistaril.  Recommend to try Haldol to help the paranoia.  Patient brought a list of medication that she can afford.  Haldol is one of the medication that she can afford.  She has never tried before.  I will also reduce trazodone to 150 mg since patient cannot afford 200 mg.  I will change lithium to 300 mg twice a day and discontinued 450 mg.  Strongly recommend to see therapist for coping and social skills.  We will continue Xanax 0.5 mg 3 times a day.  Explain in detail the risk and benefits of medication especially sedation, EPS and metabolic syndrome.  Patient like to continue Minipress  for her nightmare.  Recommend to call us back if she is any question or concern if she feels worsening of the symptom.  Followup in 2 months.  Time spent 25 minutes.  More than 50% of the time spent in psychoeducation, counseling and coordination of care.  MEDICATIONS this encounter: Meds ordered this encounter  Medications  . traZODone (DESYREL) 150 MG tablet    Sig: Take 1 tablet (150 mg total) by mouth at bedtime.    Dispense:  60 tablet    Refill:  1  . prazosin (MINIPRESS) 1 MG capsule    Sig: Take 3 capsules (3 mg total) by mouth at bedtime.    Dispense:  90 capsule    Refill:  1  . lithium carbonate 300 MG capsule    Sig: Take 1 capsule (300 mg total) by mouth 2 (two) times daily with a meal.    Dispense:  50 capsule    Refill:  1    One week supply  . FLUoxetine (PROZAC) 20 MG capsule    Sig: Three in the evening    Dispense:  90 capsule    Refill:  1  . DISCONTD: ALPRAZolam (XANAX) 0.5 MG tablet    Sig: Take 1 tablet (0.5 mg total) by mouth 3 (three) times daily as needed for anxiety.    Dispense:  90 tablet    Refill:  1  . haloperidol  (HALDOL) 5 MG tablet    Sig: Take 1 tablet (5 mg total) by mouth at bedtime.    Dispense:  30 tablet    Refill:  1  . benztropine (COGENTIN) 0.5 MG tablet    Sig: Take 2 tablets (1 mg total) by mouth daily.    Dispense:  30 tablet    Refill:  1  . ALPRAZolam (XANAX) 0.5 MG tablet    Sig: Take 1 tablet (0.5 mg total) by mouth 3 (three) times daily as needed for anxiety.    Dispense:  90 tablet    Refill:  1   Medical Decision Making Problem Points:  Established problem, worsening (2), New problem, with additional work-up planned (4), Review of last therapy session (1) and Review of psycho-social stressors (1) Data Points:  Review or order clinical lab tests (1) Review of medication regiment & side effects (2) Review of new medications or change in dosage (2)  I certify that outpatient services furnished can reasonably be expected to improve the patient's condition.   ARFEEN,SYED T., MD

## 2013-06-07 ENCOUNTER — Ambulatory Visit (HOSPITAL_COMMUNITY): Payer: Self-pay | Admitting: Psychiatry

## 2013-07-19 ENCOUNTER — Ambulatory Visit (HOSPITAL_COMMUNITY): Payer: Self-pay | Admitting: Psychiatry

## 2013-07-24 ENCOUNTER — Ambulatory Visit (HOSPITAL_COMMUNITY): Payer: Self-pay | Admitting: Psychiatry

## 2013-07-25 ENCOUNTER — Ambulatory Visit (INDEPENDENT_AMBULATORY_CARE_PROVIDER_SITE_OTHER): Payer: 59 | Admitting: Psychiatry

## 2013-07-25 ENCOUNTER — Encounter (HOSPITAL_COMMUNITY): Payer: Self-pay | Admitting: Psychiatry

## 2013-07-25 VITALS — BP 110/70 | Ht 59.0 in | Wt 205.0 lb

## 2013-07-25 DIAGNOSIS — F39 Unspecified mood [affective] disorder: Secondary | ICD-10-CM

## 2013-07-25 DIAGNOSIS — F5105 Insomnia due to other mental disorder: Secondary | ICD-10-CM

## 2013-07-25 DIAGNOSIS — F4310 Post-traumatic stress disorder, unspecified: Secondary | ICD-10-CM

## 2013-07-25 DIAGNOSIS — F319 Bipolar disorder, unspecified: Secondary | ICD-10-CM

## 2013-07-25 MED ORDER — TRAZODONE HCL 150 MG PO TABS: 150.0000 mg | ORAL_TABLET | Freq: Every day | ORAL | Status: DC

## 2013-07-25 MED ORDER — PRAZOSIN HCL 1 MG PO CAPS: 3.0000 mg | ORAL_CAPSULE | Freq: Every day | ORAL | Status: DC

## 2013-07-25 MED ORDER — FLUOXETINE HCL 40 MG PO CAPS: 40.0000 mg | ORAL_CAPSULE | Freq: Two times a day (BID) | ORAL | Status: DC

## 2013-07-25 MED ORDER — ALPRAZOLAM 0.5 MG PO TABS: 0.5000 mg | ORAL_TABLET | Freq: Three times a day (TID) | ORAL | Status: DC | PRN

## 2013-07-25 MED ORDER — HALOPERIDOL 5 MG PO TABS: 10.0000 mg | ORAL_TABLET | Freq: Every day | ORAL | Status: DC

## 2013-07-25 MED ORDER — LITHIUM CARBONATE 300 MG PO CAPS: 900.0000 mg | ORAL_CAPSULE | Freq: Every day | ORAL | Status: DC

## 2013-07-25 NOTE — Progress Notes (Signed)
Patient ID: Teresa Tran, female   DOB: November 20, 1980, 32 y.o.   MRN: [id number] [facility] [id number] Progress Note Teresa Tran MRN: [id number] DOB: [birth year] Age: 32 y.o.  Date: 07/25/2013  Chief Complaint: Chief Complaint  Patient presents with  . Depression  . Anxiety   Subjective: "I feel depressed."  This patient is a 32 year old married white female who lives with her husband and 3 children-2 daughters ages 66 and 55 and a boy age 79 in [place] she is unemployed and is applying for disability.  The patient has a history depression and anxiety dating back to sit age 26. More recently she was diagnosed with bipolar disorder. Last time she was here she did not have health insurance and physician changed all her medications to generics. She's not having a manic symptoms but now is very depressed. She has a long history of cutting herself and recently did this again. She showed me some superficial scratches on her legs. Today she is extremely blunted and shut down and holding back tears. She states that she's very worried about finances.  She denies auditory or visual hallucinations or paranoia but looks very sad and depressed and has little energy. She doesn't think that her medications are effective at the current dosages she now has Medicaid insurance so we should be able to get her up to speed on her meds. Her lithium level was at 0.1 when she was on 3 pills a day and I think we need to get back up to this dosage.    Past psychiatric history. Patient has history of significant depression and mood swings. She has at least 4 psychiatric admissions in past.  In 2000-2003 she was admitted due to cutting herself and suicidal thinking. In the past. She had tried Geodon, Depakote, Lamictal, Prozac, Abilify, Lexapro, Celexa, Effexor, Wellbutrin, BuSpar, Paxil, Risperdal, Ativan and Klonopin. She admitted history of anger, severe mood swings, and irritability.  Her last psychiatric admission was in  June 2013.  Psychosocial history. Patient was born and lived in [place]. She's been married twice. She has one son from her previous marriage and a daughter from her second marriage. She used to live by herself however recently her husband move-in and she is working on her marriage.   Allergies: Allergies  Allergen Reactions  . Risperdal [Risperidone] Other (See Comments)    Wt gain    Medical History: Past Medical History  Diagnosis Date  . Headache(784.0)   . Bipolar disorder   . Depression   Patient has obesity, and chronic migraine headache. She does not have any recent blood work. She is taking Imitrex as needed for headache.  Her blood work in March 2013 showed hemoglobin A1c is 6 however her liver enzymes and kidney functions are normal.  Her primary care physician is [facility] internal medicine.    Surgical History: Past Surgical History  Procedure Laterality Date  . Tubal ligation     Family history. Patient endorsed significant history of depression anxiety in her family. Her brother, sister and father has depression and anxiety. family history includes Anxiety disorder in her brother, father, and sister; Cancer - Other in her paternal grandfather; Depression in her brother, father, and sister. There is no history of ADD / ADHD, Alcohol abuse, Drug abuse, Bipolar disorder, Dementia, OCD, Paranoid behavior, Schizophrenia, Seizures, Sexual abuse, or Physical abuse. Reviewed all this again today in the session and nothing has changed.  Education background and work history. She has bachelor degree in human  resources.  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  She worked briefly at [employer] but quit her job due to feeling overwhelmed.  She recently worked at [employer] as a Conservation officer, nature for 2 weeks only.    Alcohol and substance use history. Patient denies any history of alcohol or substance use. She denies  a history of DWI.  She has used Xanax for two years and is not willing to stop this for Neurontin for her anxiety despite noting memory loss with the use of Xanax.  Filed Vitals:   07/25/13 1148  BP: 110/70    Mental status examination. Patient is a obese, female, who is casually dressed. She is anxious but cooperative.  She described her mood is sad nervous and her affect is constricted.  She maintained fair eye contact.  She denies any active or passive suicidal thoughts or homicidal thoughts.  She denies any auditory hallucination or visual hallucination.  She endorse some paranoia about people however she denies any obsessive thoughts or delusions.  There were no flight of ideas or loose association .  Her attention and concentration is fair.  She's alert and oriented x3. Her insight judgment and impulse control is fair.  Lab Results:  Results for orders placed in visit on 02/19/13 (from the past 8736 hour(s))  LITHIUM LEVEL   Collection Time    03/07/13 12:00 PM      Result Value Range   Lithium Lvl 1.00  0.80 - 1.40 mEq/L  Results for orders placed in visit on 01/24/13 (from the past 8736 hour(s))  LITHIUM LEVEL   Collection Time    02/02/13 11:45 AM      Result Value Range   Lithium Lvl 0.30 (*) 0.80 - 1.40 mEq/L  VITAMIN D 1,25 DIHYDROXY   Collection Time    02/02/13 11:45 AM      Result Value Range   Vitamin D 1, 25 (OH) Total 8 (*) 18 - 72 pg/mL   Vitamin D3 1, 25 (OH) 8     Vitamin D2 1, 25 (OH) <8    Results for orders placed in visit on 09/08/12 (from the past 8736 hour(s))  VITAMIN D 25 HYDROXY   Collection Time    09/08/12 12:28 PM      Result Value Range   Vit D, 25-Hydroxy 17 (*) 30 - 89 ng/mL   Assessment. Axis I mood disorder NOS, bipolar disorder 1, posttraumatic stress disorder, r/o Major depressive disorder Axis II rule out borderline personality trait. Axis III obesity, chronic headache. Axis IV moderate. Axis V 60-65.  Plan/Discussion: I review her  history and previous notes.  She will increase lithium to 900 mg per day, increase Prozac to 80 mg per day and Haldol to 10 mg each bedtime and continue all other medicines. I've been insistent today that she start therapy here. She will call if she feels worse or suicidal..  Time spent 25 minutes.  More than 50% of the time spent in psychoeducation, counseling and coordination of care.  MEDICATIONS this encounter: Meds ordered this encounter  Medications  . lithium carbonate 300 MG capsule    Sig: Take 3 capsules (900 mg total) by mouth at bedtime.    Dispense:  90 capsule    Refill:  1    One week supply  . traZODone (DESYREL) 150 MG tablet    Sig: Take 1 tablet (150 mg total) by mouth  at bedtime.    Dispense:  30 tablet    Refill:  1  . prazosin (MINIPRESS) 1 MG capsule    Sig: Take 3 capsules (3 mg total) by mouth at bedtime.    Dispense:  90 capsule    Refill:  1  . haloperidol (HALDOL) 5 MG tablet    Sig: Take 2 tablets (10 mg total) by mouth at bedtime.    Dispense:  60 tablet    Refill:  1  . ALPRAZolam (XANAX) 0.5 MG tablet    Sig: Take 1 tablet (0.5 mg total) by mouth 3 (three) times daily as needed for anxiety.    Dispense:  90 tablet    Refill:  1  . FLUoxetine (PROZAC) 40 MG capsule    Sig: Take 1 capsule (40 mg total) by mouth 2 (two) times daily.    Dispense:  60 capsule    Refill:  2   Medical Decision Making Problem Points:  Established problem, worsening (2), New problem, with additional work-up planned (4), Review of last therapy session (1) and Review of psycho-social stressors (1) Data Points:  Review or order clinical lab tests (1) Review of medication regiment & side effects (2) Review of new medications or change in dosage (2)  I certify that outpatient services furnished can reasonably be expected to improve the patient's condition.   Diannia Ruder, MD

## 2013-08-22 ENCOUNTER — Ambulatory Visit (HOSPITAL_COMMUNITY): Payer: Self-pay | Admitting: Psychiatry

## 2013-08-24 ENCOUNTER — Ambulatory Visit (HOSPITAL_COMMUNITY): Payer: Self-pay | Admitting: Psychiatry

## 2013-08-24 ENCOUNTER — Encounter (HOSPITAL_COMMUNITY): Payer: Self-pay | Admitting: Psychiatry

## 2013-09-03 ENCOUNTER — Ambulatory Visit (HOSPITAL_COMMUNITY): Payer: Self-pay | Admitting: Psychiatry

## 2013-09-04 ENCOUNTER — Encounter (HOSPITAL_COMMUNITY): Payer: Self-pay | Admitting: Psychiatry

## 2013-09-05 ENCOUNTER — Ambulatory Visit (INDEPENDENT_AMBULATORY_CARE_PROVIDER_SITE_OTHER): Payer: 59 | Admitting: Psychiatry

## 2013-09-05 ENCOUNTER — Encounter (HOSPITAL_COMMUNITY): Payer: Self-pay | Admitting: Psychiatry

## 2013-09-05 VITALS — BP 130/90 | Ht 59.0 in | Wt 189.0 lb

## 2013-09-05 DIAGNOSIS — F39 Unspecified mood [affective] disorder: Secondary | ICD-10-CM

## 2013-09-05 DIAGNOSIS — F4310 Post-traumatic stress disorder, unspecified: Secondary | ICD-10-CM

## 2013-09-05 DIAGNOSIS — F319 Bipolar disorder, unspecified: Secondary | ICD-10-CM

## 2013-09-05 MED ORDER — ZIPRASIDONE HCL 40 MG PO CAPS: 40.0000 mg | ORAL_CAPSULE | Freq: Two times a day (BID) | ORAL | Status: DC

## 2013-09-05 NOTE — Progress Notes (Signed)
Patient ID: Teresa Tran, female   DOB: January 19, 1981, 32 y.o.   MRN: [id number] Patient ID: Teresa Tran, female   DOB: [birth year], 32 y.o.   MRN: [id number] [facility] [address] Progress Note Teresa Tran MRN: [id number] DOB: 30-May-1981 Age: 32 y.o.  Date: 09/05/2013  Chief Complaint: Chief Complaint  Patient presents with  . Anxiety  . Depression  . Follow-up   Subjective: "I feel depressed."  This patient is a 32 year old married white female who lives with her husband and 3 children-2 daughters ages 21 and 48 and a boy age 58 in [place] she is unemployed and is applying for disability.  The patient has a history depression and anxiety dating back to sit age 86. More recently she was diagnosed with bipolar disorder. Last time she was here she did not have health insurance and physician changed all her medications to generics. She's not having a manic symptoms but now is very depressed. She has a long history of cutting herself and recently did this again. She showed me some superficial scratches on her legs. Today she is extremely blunted and shut down and holding back tears. She states that she's very worried about finances.  She denies auditory or visual hallucinations or paranoia but looks very sad and depressed and has little energy. She doesn't think that her medications are effective at the current dosages she now has Medicaid insurance so we should be able to get her up to speed on her meds. Her lithium level was at 0.1 when she was on 3 pills a day and I think we need to get back up to this dosage.  The patient returns after four-week's. Last time I increased both her lithium and Prozac. She states she feels very little change but on the positive side is not been cutting herself. She is very tearful and blunted again today as well as depressed. She states she is extremely worried about finances. Her husband works as a Production designer, theatre/television/film at [employer] but they're very behind on the rent. She and her  family did not qualify for any social services. The patient has no energy and just sits around all day and worries. Her disability hearing is not until January. She's not suicidal but is barely functional. Now that she is back on insurance we can try her back on Geodon rather than Haldol. She does have some tremor in her hands and we need to check her lithium level.  Today I suggested intensive outpatient treatment because of her poor functional level. She claims that she can't be going to [place] every day because of the children. I will make sure she gets an appointment with a counselor here before she leaves    Past psychiatric history. Patient has history of significant depression and mood swings. She has at least 4 psychiatric admissions in past.  In 2000-2003 she was admitted due to cutting herself and suicidal thinking. In the past. She had tried Geodon, Depakote, Lamictal, Prozac, Abilify, Lexapro, Celexa, Effexor, Wellbutrin, BuSpar, Paxil, Risperdal, Ativan and Klonopin. She admitted history of anger, severe mood swings, and irritability.  Her last psychiatric admission was in June 2013.  Psychosocial history. Patient was born and lived in [place]. She's been married twice. She has one son from her previous marriage and a daughter from her second marriage. She used to live by herself however recently her husband move-in and she is working on her marriage.   Allergies: Allergies  Allergen Reactions  . Risperdal [Risperidone] Other (  See Comments)    Wt gain    Medical History: Past Medical History  Diagnosis Date  . Headache(784.0)   . Bipolar disorder   . Depression   Patient has obesity, and chronic migraine headache. She does not have any recent blood work. She is taking Imitrex as needed for headache.  Her blood work in March 2013 showed hemoglobin A1c is 6 however her liver enzymes and kidney functions are normal.  Her primary care physician is [facility] internal  medicine.    Surgical History: Past Surgical History  Procedure Laterality Date  . Tubal ligation     Family history. Patient endorsed significant history of depression anxiety in her family. Her brother, sister and father has depression and anxiety. family history includes Anxiety disorder in her brother, father, and sister; Cancer - Other in her paternal grandfather; Depression in her brother, father, and sister. There is no history of ADD / ADHD, Alcohol abuse, Drug abuse, Bipolar disorder, Dementia, OCD, Paranoid behavior, Schizophrenia, Seizures, Sexual abuse, or Physical abuse. Reviewed all this again today in the session and nothing has changed.  Education background and work history. She has bachelor degree in Presenter, broadcasting.  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  She worked briefly at [employer] but quit her job due to feeling overwhelmed.  She recently worked at [employer] as a Conservation officer, nature for 2 weeks only.    Alcohol and substance use history. Patient denies any history of alcohol or substance use. She denies a history of DWI.  She has used Xanax for two years and is not willing to stop this for Neurontin for her anxiety despite noting memory loss with the use of Xanax.  Filed Vitals:   09/05/13 1147  BP: 130/90    Mental status examination. Patient is a obese, female, who is casually dressed. She is anxious but cooperative.  She described her mood is sad nervous and her affect is constricted.  She maintained fair eye contact.  She denies any active or passive suicidal thoughts or homicidal thoughts.  She denies any auditory hallucination or visual hallucination.  She endorse some paranoia about people however she denies any obsessive thoughts or delusions.  There were no flight of ideas or loose association .  Her attention and concentration is fair.  She's alert and oriented x3. Her insight judgment and impulse  control is fair.  Lab Results:  Results for orders placed in visit on 02/19/13 (from the past 8736 hour(s))  LITHIUM LEVEL   Collection Time    03/07/13 12:00 PM      Result Value Range   Lithium Lvl 1.00  0.80 - 1.40 mEq/L  Results for orders placed in visit on 01/24/13 (from the past 8736 hour(s))  LITHIUM LEVEL   Collection Time    02/02/13 11:45 AM      Result Value Range   Lithium Lvl 0.30 (*) 0.80 - 1.40 mEq/L  VITAMIN D 1,25 DIHYDROXY   Collection Time    02/02/13 11:45 AM      Result Value Range   Vitamin D 1, 25 (OH) Total 8 (*) 18 - 72 pg/mL   Vitamin D3 1, 25 (OH) 8     Vitamin D2 1, 25 (OH) <8    Results for orders placed in visit on 09/08/12 (from the past 8736 hour(s))  VITAMIN D 25 HYDROXY   Collection Time    09/08/12 12:28 PM  Result Value Range   Vit D, 25-Hydroxy 17 (*) 30 - 89 ng/mL   Assessment. Axis I mood disorder NOS, bipolar disorder 1, posttraumatic stress disorder, r/o Major depressive disorder Axis II rule out borderline personality trait. Axis III obesity, chronic headache. Axis IV moderate. Axis V 60-65.  Plan/Discussion: I review her history and previous notes.  She will continue all medications except discontinue Haldol and substitute Geodon 40 mg twice a day. We'll also get a lithium level I've been insistent today that she start therapy here. She will call if she feels worse or suicidal..  Time spent 25 minutes.  More than 50% of the time spent in psychoeducation, counseling and coordination of care. She will return in four-week  MEDICATIONS this encounter: Meds ordered this encounter  Medications  . ziprasidone (GEODON) 40 MG capsule    Sig: Take 1 capsule (40 mg total) by mouth 2 (two) times daily with a meal.    Dispense:  60 capsule    Refill:  2   Medical Decision Making Problem Points:  Established problem, worsening (2), New problem, with additional work-up planned (4), Review of last therapy session (1) and Review of  psycho-social stressors (1) Data Points:  Review or order clinical lab tests (1) Review of medication regiment & side effects (2) Review of new medications or change in dosage (2)  I certify that outpatient services furnished can reasonably be expected to improve the patient's condition.   Diannia Ruder, MD

## 2013-09-14 ENCOUNTER — Encounter (HOSPITAL_COMMUNITY): Payer: Self-pay | Admitting: Psychology

## 2013-09-14 ENCOUNTER — Ambulatory Visit (HOSPITAL_COMMUNITY): Payer: Self-pay | Admitting: Psychology

## 2013-10-03 ENCOUNTER — Ambulatory Visit (HOSPITAL_COMMUNITY): Payer: Self-pay | Admitting: Psychiatry

## 2013-10-18 ENCOUNTER — Telehealth (HOSPITAL_COMMUNITY): Payer: Self-pay | Admitting: Psychiatry

## 2013-10-18 NOTE — Telephone Encounter (Signed)
Needs to come in

## 2013-10-19 ENCOUNTER — Ambulatory Visit (HOSPITAL_COMMUNITY): Payer: Self-pay | Admitting: Psychiatry

## 2013-10-19 ENCOUNTER — Other Ambulatory Visit (HOSPITAL_COMMUNITY): Payer: Self-pay | Admitting: Psychiatry

## 2013-10-19 ENCOUNTER — Telehealth (HOSPITAL_COMMUNITY): Payer: Self-pay | Admitting: Psychiatry

## 2013-10-19 DIAGNOSIS — F4310 Post-traumatic stress disorder, unspecified: Secondary | ICD-10-CM

## 2013-10-19 DIAGNOSIS — F319 Bipolar disorder, unspecified: Secondary | ICD-10-CM

## 2013-10-19 DIAGNOSIS — F5105 Insomnia due to other mental disorder: Secondary | ICD-10-CM

## 2013-10-19 MED ORDER — BENZTROPINE MESYLATE 0.5 MG PO TABS: 1.0000 mg | ORAL_TABLET | Freq: Every day | ORAL | Status: DC

## 2013-10-19 MED ORDER — TRAZODONE HCL 150 MG PO TABS: 150.0000 mg | ORAL_TABLET | Freq: Every day | ORAL | Status: DC

## 2013-10-19 MED ORDER — PRAZOSIN HCL 1 MG PO CAPS: 3.0000 mg | ORAL_CAPSULE | Freq: Every day | ORAL | Status: DC

## 2013-10-19 MED ORDER — ZIPRASIDONE HCL 40 MG PO CAPS: 40.0000 mg | ORAL_CAPSULE | Freq: Two times a day (BID) | ORAL | Status: DC

## 2013-10-19 MED ORDER — LITHIUM CARBONATE 300 MG PO CAPS: 900.0000 mg | ORAL_CAPSULE | Freq: Every day | ORAL | Status: DC

## 2013-10-19 MED ORDER — FLUOXETINE HCL 40 MG PO CAPS: 40.0000 mg | ORAL_CAPSULE | Freq: Two times a day (BID) | ORAL | Status: DC

## 2013-10-19 NOTE — Telephone Encounter (Signed)
Refills given at appt

## 2013-10-23 ENCOUNTER — Encounter (HOSPITAL_COMMUNITY): Payer: Self-pay | Admitting: Psychiatry

## 2013-10-23 ENCOUNTER — Ambulatory Visit (INDEPENDENT_AMBULATORY_CARE_PROVIDER_SITE_OTHER): Payer: 59 | Admitting: Psychiatry

## 2013-10-23 VITALS — BP 100/70 | Ht 59.0 in | Wt 201.0 lb

## 2013-10-23 DIAGNOSIS — F319 Bipolar disorder, unspecified: Secondary | ICD-10-CM

## 2013-10-23 DIAGNOSIS — F5105 Insomnia due to other mental disorder: Secondary | ICD-10-CM

## 2013-10-23 DIAGNOSIS — F39 Unspecified mood [affective] disorder: Secondary | ICD-10-CM

## 2013-10-23 DIAGNOSIS — F4310 Post-traumatic stress disorder, unspecified: Secondary | ICD-10-CM

## 2013-10-23 MED ORDER — ALPRAZOLAM 0.5 MG PO TABS: 0.5000 mg | ORAL_TABLET | Freq: Three times a day (TID) | ORAL | Status: DC | PRN

## 2013-10-23 MED ORDER — TRAZODONE HCL 150 MG PO TABS: 150.0000 mg | ORAL_TABLET | Freq: Every day | ORAL | Status: DC

## 2013-10-23 NOTE — Progress Notes (Signed)
Patient ID: Karissa Meenan, female   DOB: 01/20/1981, 32 y.o.   MRN: [id number] Patient ID: Stefanie Hodgens, female   DOB: 08-29-1981, 32 y.o.   MRN: [id number] Patient ID: Valoree Agent, female   DOB: 1981/04/13, 32 y.o.   MRN: [id number] [facility] [address] Progress Note Kymberley Raz MRN: [id number] DOB: 10-May-1981 Age: 32 y.o.  Date: 10/23/2013  Chief Complaint: Chief Complaint  Patient presents with  . Anxiety  . Depression  . Follow-up   Subjective: "I feel depressed."  This patient is a 32 year old married white female who lives with her husband and 3 children-2 daughters ages 27 and 25 and a boy age 61 in [place] she is unemployed and is applying for disability.  The patient has a history depression and anxiety dating back to sit age 68. More recently she was diagnosed with bipolar disorder. Last time she was here she did not have health insurance and physician changed all her medications to generics. She's not having a manic symptoms but now is very depressed. She has a long history of cutting herself and recently did this again. She showed me some superficial scratches on her legs. Today she is extremely blunted and shut down and holding back tears. She states that she's very worried about finances.  She denies auditory or visual hallucinations or paranoia but looks very sad and depressed and has little energy. She doesn't think that her medications are effective at the current dosages she now has Medicaid insurance so we should be able to get her up to speed on her meds. Her lithium level was at 0.1 when she was on 3 pills a day and I think we need to get back up to this dosage.  The patient returns after 6. Weeks. She was supposed to return in 4 weeks and missed her appointment. Apparently she ran out of all of her medicines 2 weeks ago and didn't call until the end of last week. She has been off everything. She doesn't see much difference in her mood being off all the antidepressants and lithium.  She never got a lithium level so I told her I cannot continue to prescribe it. She is very Music therapist and doesn't do much to promote her healthcare. Her mood is depressed but she does not really have any true bipolar symptoms other than unspecified anger. I told her we could try a new antidepressant called Brintellix, use the trazodone for sleep and Xanax for anxiety. She denies auditory or visual sensations or thoughts of self-harm   Past psychiatric history. Patient has history of significant depression and mood swings. She has at least 4 psychiatric admissions in past.  In 2000-2003 she was admitted due to cutting herself and suicidal thinking. In the past. She had tried Geodon, Depakote, Lamictal, Prozac, Abilify, Lexapro, Celexa, Effexor, Wellbutrin, BuSpar, Paxil, Risperdal, Ativan and Klonopin. She admitted history of anger, severe mood swings, and irritability.  Her last psychiatric admission was in June 2013.  Psychosocial history. Patient was born and lived in [place]. She's been married twice. She has one son from her previous marriage and a daughter from her second marriage. She used to live by herself however recently her husband move-in and she is working on her marriage.   Allergies: Allergies  Allergen Reactions  . Risperdal [Risperidone] Other (See Comments)    Wt gain    Medical History: Past Medical History  Diagnosis Date  . Headache(784.0)   . Bipolar disorder   . Depression  Patient has obesity, and chronic migraine headache. She does not have any recent blood work. She is taking Imitrex as needed for headache.  Her blood work in March 2013 showed hemoglobin A1c is 6 however her liver enzymes and kidney functions are normal.  Her primary care physician is [facility] internal medicine.    Surgical History: Past Surgical History  Procedure Laterality Date  . Tubal ligation     Family history. Patient endorsed significant history of depression anxiety in her  family. Her brother, sister and father has depression and anxiety. family history includes Anxiety disorder in her brother, father, and sister; Cancer - Other in her paternal grandfather; Depression in her brother, father, and sister. There is no history of ADD / ADHD, Alcohol abuse, Drug abuse, Bipolar disorder, Dementia, OCD, Paranoid behavior, Schizophrenia, Seizures, Sexual abuse, or Physical abuse. Reviewed all this again today in the session and nothing has changed.  Education background and work history. She has bachelor degree in Presenter, broadcasting.  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  She worked briefly at [employer] but quit her job due to feeling overwhelmed.  She recently worked at [employer] as a Conservation officer, nature for 2 weeks only.    Alcohol and substance use history. Patient denies any history of alcohol or substance use. She denies a history of DWI.  She has used Xanax for two years and is not willing to stop this for Neurontin for her anxiety despite noting memory loss with the use of Xanax.  Filed Vitals:   10/23/13 1014  BP: 100/70    Mental status examination. Patient is a obese, female, who is casually dressed. She is anxious but cooperative.  She described her mood is sad nervous and her affect is constricted.  She maintained fair eye contact.  She denies any active or passive suicidal thoughts or homicidal thoughts.  She denies any auditory hallucination or visual hallucination.  She endorse some paranoia about people however she denies any obsessive thoughts or delusions.  There were no flight of ideas or loose association .  Her attention and concentration is fair.  She's alert and oriented x3. Her insight judgment and impulse control is fair.  Lab Results:  Results for orders placed in visit on 02/19/13 (from the past 8736 hour(s))  LITHIUM LEVEL   Collection Time    03/07/13 12:00 PM      Result Value  Range   Lithium Lvl 1.00  0.80 - 1.40 mEq/L  Results for orders placed in visit on 01/24/13 (from the past 8736 hour(s))  LITHIUM LEVEL   Collection Time    02/02/13 11:45 AM      Result Value Range   Lithium Lvl 0.30 (*) 0.80 - 1.40 mEq/L  VITAMIN D 1,25 DIHYDROXY   Collection Time    02/02/13 11:45 AM      Result Value Range   Vitamin D 1, 25 (OH) Total 8 (*) 18 - 72 pg/mL   Vitamin D3 1, 25 (OH) 8     Vitamin D2 1, 25 (OH) <8     Assessment. Axis I mood disorder NOS, bipolar disorder 1, posttraumatic stress disorder, r/o Major depressive disorder Axis II rule out borderline personality trait. Axis III obesity, chronic headache. Axis IV moderate. Axis V 60-65.  Plan/Discussion: I review her history and previous notes.  She is significantly noncompliant. Because of this we will discontinue all medications except trazodone and  Xanax. She will start per Brintellix 5 mg with dinner for one week then advance to 10 mg. She will start up with counseling here again  Time spent 25 minutes.  More than 50% of the time spent in psychoeducation, counseling and coordination of care. She will return in four-week  MEDICATIONS this encounter: Meds ordered this encounter  Medications  . traZODone (DESYREL) 150 MG tablet    Sig: Take 1 tablet (150 mg total) by mouth at bedtime.    Dispense:  30 tablet    Refill:  1  . ALPRAZolam (XANAX) 0.5 MG tablet    Sig: Take 1 tablet (0.5 mg total) by mouth 3 (three) times daily as needed for anxiety.    Dispense:  90 tablet    Refill:  1   Medical Decision Making Problem Points:  Established problem, worsening (2), New problem, with additional work-up planned (4), Review of last therapy session (1) and Review of psycho-social stressors (1) Data Points:  Review or order clinical lab tests (1) Review of medication regiment & side effects (2) Review of new medications or change in dosage (2)  I certify that outpatient services furnished can reasonably  be expected to improve the patient's condition.   Diannia Ruder, MD

## 2013-10-26 ENCOUNTER — Ambulatory Visit (HOSPITAL_COMMUNITY): Payer: Self-pay | Admitting: Psychiatry

## 2013-11-09 ENCOUNTER — Ambulatory Visit (HOSPITAL_COMMUNITY): Payer: Self-pay | Admitting: Psychiatry

## 2013-11-12 ENCOUNTER — Telehealth (HOSPITAL_COMMUNITY): Payer: Self-pay

## 2013-11-12 NOTE — Telephone Encounter (Signed)
No answer.. Told she could take one or two extra xanax per day, call back in am

## 2013-11-13 ENCOUNTER — Telehealth (HOSPITAL_COMMUNITY): Payer: Self-pay | Admitting: Psychiatry

## 2013-11-13 NOTE — Telephone Encounter (Signed)
Pt needs to be seen

## 2013-11-15 ENCOUNTER — Encounter (HOSPITAL_COMMUNITY): Payer: Self-pay | Admitting: Psychiatry

## 2013-11-15 ENCOUNTER — Ambulatory Visit (INDEPENDENT_AMBULATORY_CARE_PROVIDER_SITE_OTHER): Payer: 59 | Admitting: Psychiatry

## 2013-11-15 VITALS — BP 130/80 | Ht 59.0 in | Wt 202.0 lb

## 2013-11-15 DIAGNOSIS — F319 Bipolar disorder, unspecified: Secondary | ICD-10-CM

## 2013-11-15 DIAGNOSIS — F39 Unspecified mood [affective] disorder: Secondary | ICD-10-CM

## 2013-11-15 DIAGNOSIS — F4310 Post-traumatic stress disorder, unspecified: Secondary | ICD-10-CM

## 2013-11-15 MED ORDER — LITHIUM CARBONATE 300 MG PO CAPS: 900.0000 mg | ORAL_CAPSULE | Freq: Every day | ORAL | Status: DC

## 2013-11-15 MED ORDER — ALPRAZOLAM 1 MG PO TABS: 1.0000 mg | ORAL_TABLET | Freq: Three times a day (TID) | ORAL | Status: DC | PRN

## 2013-11-15 NOTE — Progress Notes (Signed)
Patient ID: Teresa Tran, female   DOB: August 08, 1981, 33 y.o.   MRN: [id number] Patient ID: Teresa Tran, female   DOB: 1981/10/19, 33 y.o.   MRN: [id number] Patient ID: Teresa Tran, female   DOB: July 10, 1981, 33 y.o.   MRN: [id number] Patient ID: Teresa Tran, female   DOB: [birth year], 33 y.o.   MRN: [id number] [place] [id number] Progress Note Adhvika Hersey MRN: [id number] DOB: [birth year] Age: 33 y.o.  Date: 11/15/2013  Chief Complaint: Chief Complaint  Patient presents with  . Anxiety  . Depression  . Manic Behavior  . Follow-up   Subjective: "I feel depressed."  This patient is a 33 year old married white female who lives with her husband and 3 children-2 daughters ages 72 and 20 and a boy age 46 in [place] she is unemployed and is applying for disability.  The patient has a history depression and anxiety dating back to sit age 70. More recently she was diagnosed with bipolar disorder. Last time she was here she did not have health insurance and physician changed all her medications to generics. She's not having a manic symptoms but now is very depressed. She has a long history of cutting herself and recently did this again. She showed me some superficial scratches on her legs. Today she is extremely blunted and shut down and holding back tears. She states that she's very worried about finances.  She denies auditory or visual hallucinations or paranoia but looks very sad and depressed and has little energy. She doesn't think that her medications are effective at the current dosages she now has Medicaid insurance so we should be able to get her up to speed on her meds. Her lithium level was at 0.1 when she was on 3 pills a day and I think we need to get back up to this dosage.  The patient returns after 3 weeks. She is seen as a work in today. She claims she needed to be seen because she was getting manic. Last time  I stopped most of her medicines because she been noncompliant. She was started on Brintellix  and was given a dosage of 10 mg. She continued on trazodone and Xanax. She now claims that she feels manic. Her thoughts are racing and she feels sped up. Not depressed or suicidal but is very anxious. She does feel in some ways the new antidepressant has helped but she is worried that she'll become impulsive and promiscuous like she has in the past. She's now willing to take the lithium as prescribed and get the drug levels   Past psychiatric history. Patient has history of significant depression and mood swings. She has at least 4 psychiatric admissions in past.  In 2000-2003 she was admitted due to cutting herself and suicidal thinking. In the past. She had tried Geodon, Depakote, Lamictal, Prozac, Abilify, Lexapro, Celexa, Effexor, Wellbutrin, BuSpar, Paxil, Risperdal, Ativan and Klonopin. She admitted history of anger, severe mood swings, and irritability.  Her last psychiatric admission was in June 2013.  Psychosocial history. Patient was born and lived in [place]. She's been married twice. She has one son from her previous marriage and a daughter from her second marriage. She used to live by herself however recently her husband move-in and she is working on her marriage.   Allergies: Allergies  Allergen Reactions  . Risperdal [Risperidone] Other (See Comments)    Wt gain    Medical History: Past Medical History  Diagnosis Date  . Headache(784.0)   . Bipolar  disorder   . Depression   Patient has obesity, and chronic migraine headache. She does not have any recent blood work. She is taking Imitrex as needed for headache.  Her blood work in March 2013 showed hemoglobin A1c is 6 however her liver enzymes and kidney functions are normal.  Her primary care physician is [facility] internal medicine.    Surgical History: Past Surgical History  Procedure Laterality Date  . Tubal ligation     Family history. Patient endorsed significant history of depression anxiety in her family. Her  brother, sister and father has depression and anxiety. family history includes Anxiety disorder in her brother, father, and sister; Cancer - Other in her paternal grandfather; Depression in her brother, father, and sister. There is no history of ADD / ADHD, Alcohol abuse, Drug abuse, Bipolar disorder, Dementia, OCD, Paranoid behavior, Schizophrenia, Seizures, Sexual abuse, or Physical abuse. Reviewed all this again today in the session and nothing has changed.  Education background and work history. She has bachelor degree in Programmer, applications.  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 She quit her job in December 2012 .  She worked briefly at [employer] but quit her job due to feeling overwhelmed.  She recently worked at [employer] as a Scientist, water quality for 2 weeks only.    Alcohol and substance use history. Patient denies any history of alcohol or substance use. She denies a history of DWI.  She has used Xanax for two years and is not willing to stop this for Neurontin for her anxiety despite noting memory loss with the use of Xanax.  Filed Vitals:   11/15/13 1201  BP: 130/80    Mental status examination. Patient is a obese, female, who is casually dressed. He is just more fashionably then last time she appears much less lethargic and more alert She is anxious but cooperative.  She described her mood as nervous and her affect is congruent  She maintained fair eye contact.  She denies any active or passive suicidal thoughts or homicidal thoughts.  She denies any auditory hallucination or visual hallucination.  She endorse some paranoia about people however she denies any obsessive thoughts or delusions.  There were no flight of ideas or loose association .  Her attention and concentration is fair.  She's alert and oriented x3. Her insight judgment and impulse control is fair.  Lab Results:  Results for orders placed in visit on 02/19/13 (from the past 8736 hour(s))  LITHIUM  LEVEL   Collection Time    03/07/13 12:00 PM      Result Value Range   Lithium Lvl 1.00  0.80 - 1.40 mEq/L  Results for orders placed in visit on 01/24/13 (from the past 8736 hour(s))  LITHIUM LEVEL   Collection Time    02/02/13 11:45 AM      Result Value Range   Lithium Lvl 0.30 (*) 0.80 - 1.40 mEq/L  VITAMIN D 1,25 DIHYDROXY   Collection Time    02/02/13 11:45 AM      Result Value Range   Vitamin D 1, 25 (OH) Total 8 (*) 18 - 72 pg/mL   Vitamin D3 1, 25 (OH) 8     Vitamin D2 1, 25 (OH) <8     Assessment. Axis I mood disorder NOS, bipolar disorder 1, posttraumatic stress disorder, r/o Major depressive disorder Axis II rule out borderline personality trait. Axis III obesity, chronic headache. Axis IV moderate. Axis V 60-65.  Plan/Discussion: I  review her history and previous notes.  She is significantly noncompliant. She will continue Brintellix 10 mg with dinner. She'll restart lithium carbonate 900 mg each bedtime. She agrees to get a blood level after 2 weeks. She will continue trazodone 150 mg each bedtime and increase Xanax to 1 mg 3 times a day because of increased anxiety. She will start up with counseling here again  Time spent 25 minutes.  More than 50% of the time spent in psychoeducation, counseling and coordination of care. She will return in 3-week  MEDICATIONS this encounter: Meds ordered this encounter  Medications  . lithium carbonate 300 MG capsule    Sig: Take 3 capsules (900 mg total) by mouth at bedtime.    Dispense:  90 capsule    Refill:  2  . ALPRAZolam (XANAX) 1 MG tablet    Sig: Take 1 tablet (1 mg total) by mouth 3 (three) times daily as needed for anxiety.    Dispense:  30 tablet    Refill:  0   Medical Decision Making Problem Points:  Established problem, worsening (2), New problem, with additional work-up planned (4), Review of last therapy session (1) and Review of psycho-social stressors (1) Data Points:  Review or order clinical lab tests  (1) Review of medication regiment & side effects (2) Review of new medications or change in dosage (2)  I certify that outpatient services furnished can reasonably be expected to improve the patient's condition.   Levonne Spiller, MD

## 2013-11-20 ENCOUNTER — Ambulatory Visit (HOSPITAL_COMMUNITY): Payer: Self-pay | Admitting: Psychiatry

## 2013-12-09 HISTORY — PX: CHOLECYSTECTOMY: SHX55

## 2013-12-12 ENCOUNTER — Telehealth (HOSPITAL_COMMUNITY): Payer: Self-pay | Admitting: *Deleted

## 2013-12-12 ENCOUNTER — Other Ambulatory Visit (HOSPITAL_COMMUNITY): Payer: Self-pay | Admitting: Psychiatry

## 2013-12-12 DIAGNOSIS — F3162 Bipolar disorder, current episode mixed, moderate: Secondary | ICD-10-CM

## 2013-12-12 LAB — LITHIUM LEVEL: Lithium Lvl: 0.7 mEq/L — ABNORMAL LOW (ref 0.80–1.40)

## 2013-12-12 NOTE — Telephone Encounter (Signed)
Order done

## 2013-12-14 ENCOUNTER — Ambulatory Visit (INDEPENDENT_AMBULATORY_CARE_PROVIDER_SITE_OTHER): Payer: BC Managed Care – PPO | Admitting: Psychiatry

## 2013-12-14 ENCOUNTER — Encounter (HOSPITAL_COMMUNITY): Payer: Self-pay | Admitting: Psychiatry

## 2013-12-14 VITALS — BP 140/82 | Ht 59.0 in | Wt 212.0 lb

## 2013-12-14 DIAGNOSIS — F319 Bipolar disorder, unspecified: Secondary | ICD-10-CM

## 2013-12-14 DIAGNOSIS — F4310 Post-traumatic stress disorder, unspecified: Secondary | ICD-10-CM

## 2013-12-14 DIAGNOSIS — F39 Unspecified mood [affective] disorder: Secondary | ICD-10-CM

## 2013-12-14 DIAGNOSIS — F3162 Bipolar disorder, current episode mixed, moderate: Secondary | ICD-10-CM

## 2013-12-14 MED ORDER — QUETIAPINE FUMARATE 50 MG PO TABS: 50.0000 mg | ORAL_TABLET | Freq: Every day | ORAL | Status: DC

## 2013-12-14 MED ORDER — LITHIUM CARBONATE 300 MG PO CAPS: ORAL_CAPSULE | ORAL | Status: DC

## 2013-12-14 MED ORDER — ALPRAZOLAM 1 MG PO TABS: 1.0000 mg | ORAL_TABLET | Freq: Four times a day (QID) | ORAL | Status: DC

## 2013-12-14 NOTE — Progress Notes (Signed)
Patient ID: Alizzon Dioguardi, female   DOB: 09/30/1981, 33 y.o.   MRN: [id number] Patient ID: Violett Hobbs, female   DOB: 07-Nov-1981, 33 y.o.   MRN: [id number] Patient ID: Anaya Bovee, female   DOB: [birth year], 33 y.o.   MRN: [id number] Patient ID: Katlynn Naser, female   DOB: Nov 13, 1980, 33 y.o.   MRN: [id number] Patient ID: Wilbert Schouten, female   DOB: November 27, 1980, 33 y.o.   MRN: [id number] [place] [id number] Progress Note Lyna Laningham MRN: [id number] DOB: Jan 18, 1981 Age: 33 y.o.  Date: 12/14/2013  Chief Complaint: Chief Complaint  Patient presents with  . Depression  . Manic Behavior  . Follow-up   Subjective: "I'm less manic."  This patient is a 33 year old married white female who lives with her husband and 3 children-2 daughters ages 60 and 41 and a boy age 22 in [place] she is unemployed and is applying for disability.  The patient returns after four-weeks. Last time she seemed to be getting manic. She couldn't sleep her thoughts are racing and she was becoming somewhat hypersexual. She was started back on lithium and is up to 3 pills at night. Her lithium level is 0.7. she is still on the lower end of the therapeutic range and therefore could increase it safely. Her thoughts are still racing and 9 she's having difficulty sleeping. She denies auditory or visual hallucinations but does feel paranoid at times like someone might be following her. She claims that Haldol helped her in the past for what I saw on this before she looked extremely blunted. I told her we could try low-dose Thorazine along with his trazodone to try to help her sleep.  She is also very anxious and acid she had one pill at Xanax per day and I think this would be okay. She's does not have any history of abuse. She denies any thoughts of hurting self or others and in some ways seems better more energetic and more neatly groomed.    Past psychiatric history. Patient has history of significant depression and mood swings. She has at least 4  psychiatric admissions in past.  In 2000-2003 she was admitted due to cutting herself and suicidal thinking. In the past. She had tried Geodon, Depakote, Lamictal, Prozac, Abilify, Lexapro, Celexa, Effexor, Wellbutrin, BuSpar, Paxil, Risperdal, Ativan and Klonopin. She admitted history of anger, severe mood swings, and irritability.  Her last psychiatric admission was in June 2013.  Psychosocial history. Patient was born and lived in [place]. She's been married twice. She has one son from her previous marriage and a daughter from her second marriage. She used to live by herself however recently her husband move-in and she is working on her marriage.   Allergies: Allergies  Allergen Reactions  . Risperdal [Risperidone] Other (See Comments)    Wt gain    Medical History: Past Medical History  Diagnosis Date  . Headache(784.0)   . Bipolar disorder   . Depression   Patient has obesity, and chronic migraine headache. She does not have any recent blood work. She is taking Imitrex as needed for headache.  Her blood work in March 2013 showed hemoglobin A1c is 6 however her liver enzymes and kidney functions are normal.  Her primary care physician is [facility] internal medicine.    Surgical History: Past Surgical History  Procedure Laterality Date  . Tubal ligation     Family history. Patient endorsed significant history of depression anxiety in her family. Her brother, sister and father has depression and  anxiety. family history includes Anxiety disorder in her brother, father, and sister; Cancer - Other in her paternal grandfather; Depression in her brother, father, and sister. There is no history of ADD / ADHD, Alcohol abuse, Drug abuse, Bipolar disorder, Dementia, OCD, Paranoid behavior, Schizophrenia, Seizures, Sexual abuse, or Physical abuse. Reviewed all this again today in the session and nothing has changed.  Education background and work history. She has bachelor degree in Animatorhuman  resources.  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 She quit her job in December 2012 .  She worked briefly at [employer] but quit her job due to feeling overwhelmed.  She recently worked at [employer] as a Conservation officer, naturecashier for 2 weeks only.    Alcohol and substance use history. Patient denies any history of alcohol or substance use. She denies a history of DWI.  She has used Xanax for two years and is not willing to stop this for Neurontin for her anxiety despite noting memory loss with the use of Xanax.  Filed Vitals:   12/14/13 1043  BP: 140/82    Mental status examination. Patient is a obese, female, who is casually dressed. He is just more fashionably then last time she appears much less lethargic and more alert She is anxious but cooperative.  She described her mood as nervous and her affect is congruent  She maintained fair eye contact.  She denies any active or passive suicidal thoughts or homicidal thoughts.  She denies any auditory hallucination or visual hallucination.  She endorse some paranoia about people however she denies any obsessive thoughts or delusions.  There were no flight of ideas or loose association .  Her attention and concentration is fair.  She's alert and oriented x3. Her insight judgment and impulse control is fair.  Lab Results:  Results for orders placed in visit on 12/12/13 (from the past 8736 hour(s))  LITHIUM LEVEL   Collection Time    12/12/13 11:18 AM      Result Value Range   Lithium Lvl 0.70 (*) 0.80 - 1.40 mEq/L  Results for orders placed in visit on 02/19/13 (from the past 8736 hour(s))  LITHIUM LEVEL   Collection Time    03/07/13 12:00 PM      Result Value Range   Lithium Lvl 1.00  0.80 - 1.40 mEq/L  Results for orders placed in visit on 01/24/13 (from the past 8736 hour(s))  LITHIUM LEVEL   Collection Time    02/02/13 11:45 AM      Result Value Range   Lithium Lvl 0.30 (*) 0.80 - 1.40 mEq/L  VITAMIN D 1,25 DIHYDROXY    Collection Time    02/02/13 11:45 AM      Result Value Range   Vitamin D 1, 25 (OH)2 Total 8 (*) 18 - 72 pg/mL   Vitamin D3 1, 25 (OH)2 8     Vitamin D2 1, 25 (OH)2 <8     Assessment. Axis I mood disorder NOS, bipolar disorder 1, posttraumatic stress disorder, r/o Major depressive disorder Axis II rule out borderline personality trait. Axis III obesity, chronic headache. Axis IV moderate. Axis V 60-65.  Plan/Discussion: I review her history and previous notes.   She will continue Brintellix 10 mg with dinner. She'll increase lithium carbonate  To 1200  mg each bedtime. She agrees to get a blood level after 2 weeks. She will continue trazodone 150 mg each bedtime and increase Xanax to 1 mg 4 times a day  because of increased anxiety she was start Seroquel 50 mg each bedtime to help with paranoia and sleep . She will start up with counseling here again  Time spent 25 minutes.  More than 50% of the time spent in psychoeducation, counseling and coordination of care. She will return in 4-weeks  MEDICATIONS this encounter: Meds ordered this encounter  Medications  . lithium carbonate 300 MG capsule    Sig: TAKE FOUR TABLETS AT BEDTIME    Dispense:  120 capsule    Refill:  2  . ALPRAZolam (XANAX) 1 MG tablet    Sig: Take 1 tablet (1 mg total) by mouth 4 (four) times daily.    Dispense:  120 tablet    Refill:  0  . QUEtiapine (SEROQUEL) 50 MG tablet    Sig: Take 1 tablet (50 mg total) by mouth at bedtime.    Dispense:  30 tablet    Refill:  2   Medical Decision Making Problem Points:  Established problem, worsening (2), New problem, with additional work-up planned (4), Review of last therapy session (1) and Review of psycho-social stressors (1) Data Points:  Review or order clinical lab tests (1) Review of medication regiment & side effects (2) Review of new medications or change in dosage (2)  I certify that outpatient services furnished can reasonably be expected to improve the  patient's condition.   Levonne Spiller, MD

## 2013-12-20 ENCOUNTER — Encounter: Payer: Self-pay | Admitting: Gastroenterology

## 2013-12-20 ENCOUNTER — Encounter (INDEPENDENT_AMBULATORY_CARE_PROVIDER_SITE_OTHER): Payer: Self-pay

## 2013-12-20 ENCOUNTER — Ambulatory Visit (INDEPENDENT_AMBULATORY_CARE_PROVIDER_SITE_OTHER): Payer: BC Managed Care – PPO | Admitting: Gastroenterology

## 2013-12-20 ENCOUNTER — Other Ambulatory Visit: Payer: Self-pay | Admitting: Gastroenterology

## 2013-12-20 VITALS — BP 125/86 | HR 90 | Temp 98.3°F | Ht 61.0 in | Wt 212.3 lb

## 2013-12-20 DIAGNOSIS — K5900 Constipation, unspecified: Secondary | ICD-10-CM

## 2013-12-20 DIAGNOSIS — R1011 Right upper quadrant pain: Secondary | ICD-10-CM

## 2013-12-20 LAB — CBC
HCT: 36.8 % (ref 36.0–46.0)
Hemoglobin: 12.3 g/dL (ref 12.0–15.0)
MCH: 28.8 pg (ref 26.0–34.0)
MCHC: 33.4 g/dL (ref 30.0–36.0)
MCV: 86.2 fL (ref 78.0–100.0)
PLATELETS: 300 10*3/uL (ref 150–400)
RBC: 4.27 MIL/uL (ref 3.87–5.11)
RDW: 14.7 % (ref 11.5–15.5)
WBC: 8.1 10*3/uL (ref 4.0–10.5)

## 2013-12-20 MED ORDER — LINACLOTIDE 145 MCG PO CAPS: 145.0000 ug | ORAL_CAPSULE | Freq: Every day | ORAL | Status: DC

## 2013-12-20 MED ORDER — OMEPRAZOLE 20 MG PO CPDR: 20.0000 mg | DELAYED_RELEASE_CAPSULE | Freq: Every day | ORAL | Status: DC

## 2013-12-20 NOTE — Progress Notes (Signed)
Primary Care Physician:  No primary provider on file. Primary Gastroenterologist:  Dr. Oneida Alar   Chief Complaint  Patient presents with  . Abdominal Pain    HPI:   Teresa Tran presents today as a self-referral secondary to concern for "gallbladder attacks" for years. States she has RUQ abdominal discomfort, radiating around side, up towards shoulders. Lately happening more often. Daily to every other day. Previously would be affected with spicy foods like spaghetti, but now occuring with other types of food that did not bother her in the past. Worsened with taking a deep breath. Associated nausea. Vomiting with each attack. Occasional reflux. No PPI. Ibuprofen daily. Occasional dysphagia with breads. No melena. Occasional low-volume hematochezia. States normally constipated. Pain not relieved with defecation. BM every 2-3 days.   Past Medical History  Diagnosis Date  . Headache(784.0)   . Bipolar disorder   . Depression     Past Surgical History  Procedure Laterality Date  . Tubal ligation      Current Outpatient Prescriptions  Medication Sig Dispense Refill  . ALPRAZolam (XANAX) 1 MG tablet Take 1 tablet (1 mg total) by mouth 4 (four) times daily.  120 tablet  0  . calcium-vitamin D (OSCAL 500/200 D-3) 500-200 MG-UNIT per tablet Take 1 tablet by mouth 2 (two) times daily.  60 tablet  2  . ergocalciferol (VITAMIN D2) 50000 UNITS capsule Take 1 capsule (50,000 Units total) by mouth once a week.  4 capsule  2  . lithium carbonate 300 MG capsule TAKE FOUR TABLETS AT BEDTIME  120 capsule  2  . QUEtiapine (SEROQUEL) 50 MG tablet Take 1 tablet (50 mg total) by mouth at bedtime.  30 tablet  2  . Vortioxetine HBr (BRINTELLIX) 10 MG TABS Take 10 mg by mouth daily.      . traZODone (DESYREL) 150 MG tablet Take 1 tablet (150 mg total) by mouth at bedtime.  30 tablet  1   No current facility-administered medications for this visit.    Allergies as of 12/20/2013 - Review Complete  12/20/2013  Allergen Reaction Noted  . Risperdal [risperidone] Other (See Comments) 10/11/2012    Family History  Problem Relation Age of Onset  . Depression Father   . Anxiety disorder Father   . Depression Sister   . Anxiety disorder Sister   . Depression Brother   . Anxiety disorder Brother   . ADD / ADHD Neg Hx   . Alcohol abuse Neg Hx   . Drug abuse Neg Hx   . Bipolar disorder Neg Hx   . Dementia Neg Hx   . OCD Neg Hx   . Paranoid behavior Neg Hx   . Schizophrenia Neg Hx   . Seizures Neg Hx   . Sexual abuse Neg Hx   . Physical abuse Neg Hx   . Cancer - Other Paternal Grandfather   . Colon cancer Neg Hx     History   Social History  . Marital Status: Legally Separated    Spouse Name: N/A    Number of Children: N/A  . Years of Education: N/A   Occupational History  . Not on file.   Social History Main Topics  . Smoking status: Never Smoker   . Smokeless tobacco: Never Used  . Alcohol Use: No  . Drug Use: No  . Sexual Activity: Yes    Birth Control/ Protection: Other-see comments     Comment: tubal ligation   Other Topics Concern  .  Not on file   Social History Narrative  . No narrative on file    Review of Systems: As mentioned in HPI.  Physical Exam: BP 125/86  Pulse 90  Temp(Src) 98.3 F (36.8 C) (Oral)  Ht 5\' 1"  (1.549 m)  Wt 212 lb 4.8 oz (96.299 kg)  BMI 40.13 kg/m2  LMP 12/12/2013 General:   Alert and oriented. Pleasant and cooperative. Well-nourished and well-developed.  Head:  Normocephalic and atraumatic. Eyes:  Without icterus, sclera clear and conjunctiva pink.  Ears:  Normal auditory acuity. Nose:  No deformity, discharge,  or lesions. Mouth:  No deformity or lesions, oral mucosa pink.  Neck:  Supple, without mass or thyromegaly. Lungs:  Clear to auscultation bilaterally. No wheezes, rales, or rhonchi. No distress.  Heart:  S1, S2 present without murmurs appreciated.  Abdomen:  +BS, soft, TTP RUQ and non-distended. No HSM  noted. No guarding or rebound. No masses appreciated.  Rectal:  Deferred  Msk:  Symmetrical without gross deformities. Normal posture. Extremities:  Without clubbing or edema. Neurologic:  Alert and  oriented x4;  grossly normal neurologically. Skin:  Intact without significant lesions or rashes. Cervical Nodes:  No significant cervical adenopathy. Psych:  Alert and cooperative. Normal mood and affect.

## 2013-12-20 NOTE — Patient Instructions (Signed)
Please complete blood work today.   We will try to set up the ultrasound at [facility]; this will need to be faxed back to [name].   Start taking Prilosec once each morning, 30 minutes before breakfast. Avoid fatty, greasy foods. Limit Ibuprofen use.  Start Linzess 1 capsule each morning, 30 minutes before breakfast.   Further recommendations after ultrasound completed.

## 2013-12-21 LAB — COMPREHENSIVE METABOLIC PANEL
ALT: 27 U/L (ref 0–35)
AST: 20 U/L (ref 0–37)
Albumin: 3.7 g/dL (ref 3.5–5.2)
Alkaline Phosphatase: 90 U/L (ref 39–117)
BUN: 12 mg/dL (ref 6–23)
CALCIUM: 9 mg/dL (ref 8.4–10.5)
CHLORIDE: 106 meq/L (ref 96–112)
CO2: 27 meq/L (ref 19–32)
CREATININE: 0.67 mg/dL (ref 0.50–1.10)
GLUCOSE: 106 mg/dL — AB (ref 70–99)
Potassium: 4.1 mEq/L (ref 3.5–5.3)
Sodium: 140 mEq/L (ref 135–145)
Total Bilirubin: 0.2 mg/dL (ref 0.2–1.2)
Total Protein: 6.4 g/dL (ref 6.0–8.3)

## 2013-12-24 NOTE — Assessment & Plan Note (Signed)
Start Linzess 145 mcg daily. Low-volume hematochezia in this setting likely benign. Discussed with patient need for colonoscopy in future; she will consider this. For now, proceed with RUQ pain work-up.

## 2013-12-24 NOTE — Progress Notes (Signed)
No pcp

## 2013-12-24 NOTE — Assessment & Plan Note (Addendum)
Chronic episodic RUQ pain, triggered by dietary choices, associated with nausea and vomiting. Intermittent reflux, taking NSAIDs daily. Vague dysphagia reported to breads. No relief of pain after defecation.   No imaging on file. Will proceed with [name] of [place]. Patient desires to have [facility]. Will likely need EGD to rule out occult issues, specifically in the setting of chronic NSAIDs. Will trial Prilosec daily in the interim. If [name] of abdomen inconclusive, proceed with EGD with Dr. Oneida Alar. She stated understanding. May need dilation at time of EGD due to vague esophageal dysphagia; however, this could be due to uncontrolled GERD.  Check CBC, CMP.

## 2013-12-25 NOTE — Progress Notes (Signed)
Quick Note:  CBC, CMP normal. Glucose just mildly elevated at 106; she may not have been fasting.  Awaiting [name] of abdomen from [facility]. ______

## 2013-12-26 NOTE — Progress Notes (Signed)
Received outside [name] of [place] from Dec 24, 2013.  Gallbladder is contracted and filled with stones. Likely small hemangioma in right lobe of liver. Will need repeat US of abdomen in 6 months for stability.   I prescribed Prilosec at last visit. How is dysphagia and reflux since that time?   We can proceed with a HIDA with fatty meal challenge (NOT CCK). Ultimately, I feel she needs an EGD to rule out occult issues. Biliary dyskinesia definitely remains high in the differentials. She had scant hematochezia in the past. Is she willing to proceed with a colonoscopy at time of EGD?

## 2013-12-26 NOTE — Progress Notes (Signed)
LMOM to call.

## 2013-12-27 NOTE — Progress Notes (Signed)
Let's proceed with EGD/ED with Dr. Oneida Alar to rule out any GI issues. May go ahead and refer to Dr. Arnoldo Morale for consideration of elective cholecystectomy. He may elect to perform a HIDA scan first.

## 2013-12-27 NOTE — Progress Notes (Signed)
Pt returned call and was informed. She said she is having a lot of pain under her rib cage and she wants Teresa Tran to proceed to refer to Psychologist, sport and exercise. She does not have a preference.  She is willing to do EGD but she is not willing to do TCS.  Please advise!  ( Can leave a message on the number listed. Her husband has that phone at work and she is not sure he will get the message, but she will call back later today if she doesn't hear from [name]).

## 2013-12-27 NOTE — Progress Notes (Signed)
Pt called and was informed. However, she has made an appt with Dr. Anthony Sar in [place] for [place]. She said that it is OK to schedule the EGD/ED. She just wants them scheduled as soon as possible.

## 2013-12-27 NOTE — Progress Notes (Signed)
Let's see what Dr. Anthony Sar says on Monday. Let's touch base with her. Then, if he is not convinced this is a biliary issue, we will set her up for an EGD/ED with DR. Fields.

## 2013-12-28 NOTE — Progress Notes (Signed)
Called and informed pt. She will call us and let us know what Dr. Anthony Sar says after her visit there on Mon.

## 2014-01-02 ENCOUNTER — Other Ambulatory Visit: Payer: Self-pay | Admitting: Adult Health

## 2014-01-04 ENCOUNTER — Telehealth: Payer: Self-pay

## 2014-01-04 NOTE — Telephone Encounter (Signed)
Pt called to say that she had her GB surgery on WED. I called her to see what the surgeon said and how she is doing. LMOM for a return call.

## 2014-01-07 NOTE — Telephone Encounter (Signed)
Pt left VM to call her. I have called and LMOM for a return call.

## 2014-01-07 NOTE — Telephone Encounter (Signed)
Pt returned call and she said she had the surgery, and her abdominal pain might be just a little better, but she is still having it. Ok to schedule EGD if that is what Laban Emperor, NP still wants to do.

## 2014-01-08 ENCOUNTER — Telehealth: Payer: Self-pay

## 2014-01-08 NOTE — Telephone Encounter (Signed)
I called pt. She said the only difficulty she has swallowing is with bread. It is always more difficult to swallow. She is aware that you want to wait a couple of weeks to reassess before she is scheduled for the EGD and she said that is fine.

## 2014-01-08 NOTE — Telephone Encounter (Signed)
Called and [facility] for pt to return call and that we will be scheduling the EGD/ED.

## 2014-01-08 NOTE — Telephone Encounter (Signed)
Yes, may set up for EGD/ED with Dr. Oneida Alar. Some of this may still be post-op recovery. Is she still having pain with eating?

## 2014-01-08 NOTE — Telephone Encounter (Signed)
PT is scheduled to see Laban Emperor, NP on 01/30/2014 at 10:00 AM.

## 2014-01-08 NOTE — Telephone Encounter (Signed)
Pt states that she had gallbladder surgery last week and was given antibiotics and now is having vaginal itching. Pt was advised that she would have to make an appointment with a provider before they would give her any medications. I switched her up front to make an appointment.

## 2014-01-08 NOTE — Telephone Encounter (Signed)
Is she still having difficulty swallowing? Honestly, I feel we should wait a week or so until she has recovered from the lap chole. IF she is not having dysphagia, we need to hold of on the EGD for a week or 2 and reassess.

## 2014-01-08 NOTE — Telephone Encounter (Signed)
Let's offer her an appt with [name] in about 2 weeks.

## 2014-01-08 NOTE — Telephone Encounter (Signed)
Pt returned call. She is not having pain when eating, she gets full quickly. She said ok to schedule.

## 2014-01-09 ENCOUNTER — Ambulatory Visit (INDEPENDENT_AMBULATORY_CARE_PROVIDER_SITE_OTHER): Payer: BC Managed Care – PPO | Admitting: Obstetrics and Gynecology

## 2014-01-09 ENCOUNTER — Encounter: Payer: Self-pay | Admitting: Obstetrics and Gynecology

## 2014-01-09 ENCOUNTER — Encounter (INDEPENDENT_AMBULATORY_CARE_PROVIDER_SITE_OTHER): Payer: Self-pay

## 2014-01-09 VITALS — BP 120/80 | Ht 60.0 in | Wt 214.5 lb

## 2014-01-09 DIAGNOSIS — B373 Candidiasis of vulva and vagina: Secondary | ICD-10-CM | POA: Insufficient documentation

## 2014-01-09 DIAGNOSIS — B3731 Acute candidiasis of vulva and vagina: Secondary | ICD-10-CM | POA: Insufficient documentation

## 2014-01-09 LAB — POCT WET PREP WITH KOH
Bacteria Wet Prep HPF POC: NEGATIVE
Clue Cells Wet Prep HPF POC: NEGATIVE
EPITHELIAL WET PREP PER HPF POC: NEGATIVE
KOH Prep POC: POSITIVE
RBC Wet Prep HPF POC: NEGATIVE
Trichomonas, UA: NEGATIVE
WBC Wet Prep HPF POC: POSITIVE
Yeast Wet Prep HPF POC: POSITIVE

## 2014-01-09 MED ORDER — NYSTATIN-TRIAMCINOLONE 100000-0.1 UNIT/GM-% EX OINT: 1.0000 "application " | TOPICAL_OINTMENT | Freq: Two times a day (BID) | CUTANEOUS | Status: DC

## 2014-01-09 MED ORDER — FLUCONAZOLE 150 MG PO TABS: 150.0000 mg | ORAL_TABLET | Freq: Once | ORAL | Status: DC

## 2014-01-09 NOTE — Progress Notes (Signed)
This chart was scribed by Teresa Tran, Medical Scribe, for Dr. Mallory Tran on 01/09/14 at 9:33 AM. This chart was reviewed by Dr. Mallory Tran and is accurate.    [facility] Visit  Patient name: Teresa Tran MRN [id number]  Date of birth: [birth year]  CC & HPI:  Teresa Tran is a 33 y.o. female presenting today for vaginal itching with vaginal discharge and vaginal burning. Pt had cholecystectomy done one week ago. Was given antibiotics and had foley cath placed at the time. No diarrhea s/p surgery. No h/o DM.  ROS:  + vaginal itching, pain, discharge + mild abdominal pain from surgery Denies any other sxs  Pertinent History Reviewed:  Medical & Surgical Hx:  Reviewed: Significant for Cholecystectomy done on 01/02/14 by Dr. Tye Tran. Medications: Reviewed & Updated - see associated section Social History: Reviewed -  reports that she has never smoked. She has never used smokeless tobacco.  Objective Findings:  Vitals: BP 120/80  Ht 5' (1.524 m)  Wt 214 lb 8 oz (97.297 kg)  BMI 41.89 kg/m2  LMP 12/12/2013 Chaperone present for exam. Exam performed with pt's permission with no complications. Physical Examination: General appearance - alert, well appearing, and in no distress, oriented to person, place, and time and overweight Mental status - alert, oriented to person, place, and time, normal mood, behavior, speech, dress, motor activity, and thought processes Abdomen -Four well-healing incisions, no signs of infection Pelvic - VULVA: appears inflamed with satellite lesions, VAGINA: appears inflamed with satellite lesions, exam limited by discomfort  KOH Prep: Positive yeast and WBCs, negative for trich    Assessment & Plan:  A: Monilial Vaginitis  P: Diflucan and Mycolog

## 2014-01-09 NOTE — Patient Instructions (Signed)
Monilial Vaginitis  Vaginitis in a soreness, swelling and redness (inflammation) of the vagina and vulva. Monilial vaginitis is not a sexually transmitted infection.  CAUSES   Yeast vaginitis is caused by yeast (candida) that is normally found in your vagina. With a yeast infection, the candida has overgrown in number to a point that upsets the chemical balance.  SYMPTOMS   · White, thick vaginal discharge.  · Swelling, itching, redness and irritation of the vagina and possibly the lips of the vagina (vulva).  · Burning or painful urination.  · Painful intercourse.  DIAGNOSIS   Things that may contribute to monilial vaginitis are:  · Postmenopausal and virginal states.  · Pregnancy.  · Infections.  · Being tired, sick or stressed, especially if you had monilial vaginitis in the past.  · Diabetes. Good control will help lower the chance.  · Birth control pills.  · Tight fitting garments.  · Using bubble bath, feminine sprays, douches or deodorant tampons.  · Taking certain medications that kill germs (antibiotics).  · Sporadic recurrence can occur if you become ill.  TREATMENT   Your caregiver will give you medication.  · There are several kinds of anti monilial vaginal creams and suppositories specific for monilial vaginitis. For recurrent yeast infections, use a suppository or cream in the vagina 2 times a week, or as directed.  · Anti-monilial or steroid cream for the itching or irritation of the vulva may also be used. Get your caregiver's permission.  · Painting the vagina with methylene blue solution may help if the monilial cream does not work.  · Eating yogurt may help prevent monilial vaginitis.  HOME CARE INSTRUCTIONS   · Finish all medication as prescribed.  · Do not have sex until treatment is completed or after your caregiver tells you it is okay.  · Take warm sitz baths.  · Do not douche.  · Do not use tampons, especially scented ones.  · Wear cotton underwear.  · Avoid tight pants and panty  hose.  · Tell your sexual partner that you have a yeast infection. They should go to their caregiver if they have symptoms such as mild rash or itching.  · Your sexual partner should be treated as well if your infection is difficult to eliminate.  · Practice safer sex. Use condoms.  · Some vaginal medications cause latex condoms to fail. Vaginal medications that harm condoms are:  · Cleocin cream.  · Butoconazole (Femstat®).  · Terconazole (Terazol®) vaginal suppository.  · Miconazole (Monistat®) (may be purchased over the counter).  SEEK MEDICAL CARE IF:   · You have a temperature by mouth above 102° F (38.9° C).  · The infection is getting worse after 2 days of treatment.  · The infection is not getting better after 3 days of treatment.  · You develop blisters in or around your vagina.  · You develop vaginal bleeding, and it is not your menstrual period.  · You have pain when you urinate.  · You develop intestinal problems.  · You have pain with sexual intercourse.  Document Released: 08/04/2005 Document Revised: 01/17/2012 Document Reviewed: 04/18/2009  ExitCare® Patient Information ©2014 [employer].

## 2014-01-11 ENCOUNTER — Telehealth (HOSPITAL_COMMUNITY): Payer: Self-pay | Admitting: *Deleted

## 2014-01-11 ENCOUNTER — Ambulatory Visit (HOSPITAL_COMMUNITY): Payer: Self-pay | Admitting: Psychiatry

## 2014-01-16 ENCOUNTER — Other Ambulatory Visit: Payer: Self-pay | Admitting: Adult Health

## 2014-01-21 ENCOUNTER — Telehealth (HOSPITAL_COMMUNITY): Payer: Self-pay | Admitting: *Deleted

## 2014-01-22 ENCOUNTER — Telehealth (HOSPITAL_COMMUNITY): Payer: Self-pay | Admitting: *Deleted

## 2014-01-25 ENCOUNTER — Ambulatory Visit (HOSPITAL_COMMUNITY): Payer: Self-pay | Admitting: Psychiatry

## 2014-01-30 ENCOUNTER — Encounter (HOSPITAL_COMMUNITY): Payer: Self-pay | Admitting: Pharmacy Technician

## 2014-01-30 ENCOUNTER — Ambulatory Visit (INDEPENDENT_AMBULATORY_CARE_PROVIDER_SITE_OTHER): Payer: BC Managed Care – PPO | Admitting: Gastroenterology

## 2014-01-30 ENCOUNTER — Telehealth (HOSPITAL_COMMUNITY): Payer: Self-pay | Admitting: *Deleted

## 2014-01-30 ENCOUNTER — Other Ambulatory Visit (HOSPITAL_COMMUNITY): Payer: Self-pay | Admitting: Psychiatry

## 2014-01-30 ENCOUNTER — Other Ambulatory Visit: Payer: Self-pay | Admitting: Gastroenterology

## 2014-01-30 ENCOUNTER — Encounter: Payer: Self-pay | Admitting: Gastroenterology

## 2014-01-30 VITALS — BP 123/84 | HR 72 | Temp 97.3°F | Ht 60.0 in | Wt 209.2 lb

## 2014-01-30 DIAGNOSIS — R112 Nausea with vomiting, unspecified: Secondary | ICD-10-CM

## 2014-01-30 DIAGNOSIS — R1319 Other dysphagia: Secondary | ICD-10-CM

## 2014-01-30 DIAGNOSIS — R1310 Dysphagia, unspecified: Secondary | ICD-10-CM

## 2014-01-30 DIAGNOSIS — R195 Other fecal abnormalities: Secondary | ICD-10-CM

## 2014-01-30 DIAGNOSIS — F3162 Bipolar disorder, current episode mixed, moderate: Secondary | ICD-10-CM

## 2014-01-30 MED ORDER — OMEPRAZOLE 20 MG PO CPDR
20.0000 mg | DELAYED_RELEASE_CAPSULE | Freq: Every day | ORAL | Status: DC
Start: 2014-01-30 — End: 2014-01-30

## 2014-01-30 MED ORDER — OMEPRAZOLE 20 MG PO CPDR: 20.0000 mg | DELAYED_RELEASE_CAPSULE | Freq: Every day | ORAL | Status: DC

## 2014-01-30 MED ORDER — ONDANSETRON HCL 4 MG PO TABS
4.0000 mg | ORAL_TABLET | Freq: Three times a day (TID) | ORAL | Status: DC | PRN
Start: 2014-01-30 — End: 2014-06-06

## 2014-01-30 MED ORDER — DICYCLOMINE HCL 10 MG PO CAPS: 10.0000 mg | ORAL_CAPSULE | Freq: Three times a day (TID) | ORAL | Status: DC

## 2014-01-30 NOTE — Progress Notes (Signed)
Referring Provider: No ref. provider found Primary Care Physician:  No PCP Per Patient Primary GI: Dr. Oneida Alar   Chief Complaint  Patient presents with  . Nausea  . Diarrhea    HPI:   Teresa Tran presents today in follow-up after cholecystectomy Feb 2015 at [facility]. She was seen by myself in Feb 2015 secondary to chronic RUQ pain, N/V. Low-volume hematochezia noted at that time. Vague dysphagia. Biliary component high on differential list, but I had discussed possibility of needing an EGD/ED. She declines colonoscopy. Now presents with persistent symptoms of N/V despite absent gallbladder.   Notes diarrhea since cholecystectomy. About 4-5 loose stools per day. No rectal bleeding. Not postprandial. Prior history of constipation. No abdominal pain. Intermittent N/V, no specific triggers. Stays nauseated during the day. Vomiting usually once per day. Denies esophageal dysphagia currently as she is not eating much but does note a history of dysphagia with bread. No reflux symptoms. Prilosec 20 mg daily. No melena. Ibuprofen daily due to headaches. Increased use lately due to headaches.   Remote history of hematochezia in the setting of constipation. Was recently on Keflex due to post-op incision infection. Was having diarrhea prior to starting Keflex.    Past Medical History  Diagnosis Date  . Headache(784.0)   . Bipolar disorder   . Depression     Past Surgical History  Procedure Laterality Date  . Tubal ligation    . Cholecystectomy  Feb 2015    Dr. Ladona Horns, [facility]    Current Outpatient Prescriptions  Medication Sig Dispense Refill  . ALPRAZolam (XANAX) 1 MG tablet Take 1 tablet (1 mg total) by mouth 4 (four) times daily.  120 tablet  0  . lithium carbonate 300 MG capsule TAKE FOUR TABLETS AT BEDTIME  120 capsule  2  . nystatin-triamcinolone ointment (MYCOLOG) Apply 1 application topically 2 (two) times daily. To affected area.  30 g  prn  . omeprazole (PRILOSEC) 20 MG  capsule Take 1 capsule (20 mg total) by mouth daily. 30 minutes before breakfast daily.  30 capsule  3  . QUEtiapine (SEROQUEL) 50 MG tablet Take 1 tablet (50 mg total) by mouth at bedtime.  30 tablet  2  . Vortioxetine HBr (BRINTELLIX) 10 MG TABS Take 10 mg by mouth daily.       No current facility-administered medications for this visit.    Allergies as of 01/30/2014 - Review Complete 01/30/2014  Allergen Reaction Noted  . Risperdal [risperidone] Other (See Comments) 10/11/2012  . Macrobid [nitrofurantoin monohyd macro] Itching 01/09/2014    Family History  Problem Relation Age of Onset  . Depression Father   . Anxiety disorder Father   . Depression Sister   . Anxiety disorder Sister   . Depression Brother   . Anxiety disorder Brother   . ADD / ADHD Neg Hx   . Alcohol abuse Neg Hx   . Drug abuse Neg Hx   . Bipolar disorder Neg Hx   . Dementia Neg Hx   . OCD Neg Hx   . Paranoid behavior Neg Hx   . Schizophrenia Neg Hx   . Seizures Neg Hx   . Sexual abuse Neg Hx   . Physical abuse Neg Hx   . Colon cancer Neg Hx   . Cancer - Other Paternal Grandfather     History   Social History  . Marital Status: Legally Separated    Spouse Name: N/A    Number of Children: N/A  .  Years of Education: N/A   Social History Main Topics  . Smoking status: Never Smoker   . Smokeless tobacco: Never Used  . Alcohol Use: No  . Drug Use: No  . Sexual Activity: Yes    Birth Control/ Protection: Surgical     Comment: tubal   Other Topics Concern  . None   Social History Narrative  . None    Review of Systems: As mentioned in HPI.   Physical Exam: BP 123/84  Pulse 72  Temp(Src) 97.3 F (36.3 C) (Oral)  Ht 5' (1.524 m)  Wt 209 lb 3.2 oz (94.892 kg)  BMI 40.86 kg/m2  LMP 12/09/2013 General:   Alert and oriented. No distress noted. Pleasant and cooperative.  Head:  Normocephalic and atraumatic. Eyes:  Conjuctiva clear without scleral icterus. Mouth:  Oral mucosa pink and  moist. Good dentition. No lesions. Heart:  S1, S2 present without murmurs, rubs, or gallops. Regular rate and rhythm. Abdomen:  +BS, soft, non-tender and non-distended. No rebound or guarding. No HSM or masses noted. Msk:  Symmetrical without gross deformities. Normal posture. Pulses:  2+ DP noted bilaterally Extremities:  Without edema. Neurologic:  Alert and  oriented x4;  grossly normal neurologically. Skin:  Intact without significant lesions or rashes. Psych:  Alert and cooperative. Normal mood and affect.  Lab Results  Component Value Date   ALT 27 12/20/2013   AST 20 12/20/2013   ALKPHOS 90 12/20/2013   BILITOT 0.2 12/20/2013   Lab Results  Component Value Date   WBC 8.1 12/20/2013   HGB 12.3 12/20/2013   HCT 36.8 12/20/2013   MCV 86.2 12/20/2013   PLT 300 12/20/2013

## 2014-01-30 NOTE — Patient Instructions (Signed)
Please complete the stool studies and return to the lab.  We have scheduled you for an upper endoscopy with dilation with Dr. Oneida Alar in the near future.  I have sent Zofran to your pharmacy for nausea. I have also sent Bentyl, which you can take 1 capsule with meals and at bedtime.

## 2014-01-30 NOTE — Assessment & Plan Note (Signed)
Dilation as appropriate.  

## 2014-01-30 NOTE — Assessment & Plan Note (Signed)
33 year old female with persistent N/V despite cholecystectomy; RUQ pain resolved since surgery. No specific aggravating or relieving factors; endorses use of Ibuprofen regularly for headaches. Prilosec without improvement. Question uncontrolled GERD, gastritis, esophagitis, possible PUD. Question med effect as culprit as well. Doubt gastroparesis but unable to exclude. LFTs historically normal without any fluctuations. Vague esophageal dysphagia with bread historically.   Proceed with upper endoscopy and dilation as appropriate in the near future with Dr. Oneida Alar. The risks, benefits, and alternatives have been discussed in detail with patient. They have stated understanding and desire to proceed.  Phenergan 25 mg IV on call due to polypharmacy Zofran prn nausea Continue PPI  Needs repeat US in Aug 2015 due to possible liver hemangioma

## 2014-01-30 NOTE — Progress Notes (Signed)
NO PCP

## 2014-01-30 NOTE — Telephone Encounter (Signed)
done

## 2014-01-30 NOTE — Assessment & Plan Note (Signed)
Baseline constipation, now with loose stools after cholecystectomy. Likely bile salt diarrhea component; she was exposed to antibiotics recently but doubt Cdiff although not entirely ruled out. Obtain Cdiff, stool culture, Giarda. Bentyl for supportive measures. No current hematochezia but notes low-volume in past. Declining colonoscopy.

## 2014-01-31 ENCOUNTER — Ambulatory Visit (HOSPITAL_COMMUNITY): Payer: Self-pay | Admitting: Psychiatry

## 2014-02-01 LAB — LITHIUM LEVEL: Lithium Lvl: 0.6 mEq/L — ABNORMAL LOW (ref 0.80–1.40)

## 2014-02-04 ENCOUNTER — Ambulatory Visit (INDEPENDENT_AMBULATORY_CARE_PROVIDER_SITE_OTHER): Payer: BC Managed Care – PPO | Admitting: Psychiatry

## 2014-02-04 ENCOUNTER — Encounter (HOSPITAL_COMMUNITY): Payer: Self-pay | Admitting: Psychiatry

## 2014-02-04 ENCOUNTER — Telehealth: Payer: Self-pay | Admitting: Gastroenterology

## 2014-02-04 VITALS — BP 120/80 | Ht 60.0 in | Wt 210.0 lb

## 2014-02-04 DIAGNOSIS — F319 Bipolar disorder, unspecified: Secondary | ICD-10-CM

## 2014-02-04 DIAGNOSIS — F4310 Post-traumatic stress disorder, unspecified: Secondary | ICD-10-CM

## 2014-02-04 DIAGNOSIS — F39 Unspecified mood [affective] disorder: Secondary | ICD-10-CM

## 2014-02-04 DIAGNOSIS — F3162 Bipolar disorder, current episode mixed, moderate: Secondary | ICD-10-CM

## 2014-02-04 MED ORDER — VORTIOXETINE HBR 10 MG PO TABS: 10.0000 mg | ORAL_TABLET | Freq: Every day | ORAL | Status: DC

## 2014-02-04 MED ORDER — ALPRAZOLAM 1 MG PO TABS: 1.0000 mg | ORAL_TABLET | Freq: Four times a day (QID) | ORAL | Status: DC

## 2014-02-04 MED ORDER — QUETIAPINE FUMARATE 50 MG PO TABS: 50.0000 mg | ORAL_TABLET | Freq: Every day | ORAL | Status: DC

## 2014-02-04 MED ORDER — LITHIUM CARBONATE 300 MG PO CAPS: ORAL_CAPSULE | ORAL | Status: DC

## 2014-02-04 NOTE — Telephone Encounter (Signed)
[facility] EGD/ED to Monday April 6th

## 2014-02-04 NOTE — Telephone Encounter (Signed)
Pt is scheduled for an EGD tomorrow with [facility] and wants to [facility]. Please call her back at [phone]

## 2014-02-04 NOTE — Progress Notes (Signed)
Patient ID: Teresa Tran, female   DOB: 1981/10/11, 33 y.o.   MRN: [id number] Patient ID: Teresa Tran, female   DOB: 1981-03-01, 33 y.o.   MRN: [id number] Patient ID: Teresa Tran, female   DOB: 01-14-1981, 33 y.o.   MRN: [id number] Patient ID: Teresa Tran, female   DOB: 17-Nov-1980, 33 y.o.   MRN: [id number] Patient ID: Teresa Tran, female   DOB: 05-Jul-1981, 33 y.o.   MRN: [id number] Patient ID: Teresa Tran, female   DOB: 03/04/1981, 33 y.o.   MRN: [id number] [place] [address] Progress Note [name] MRN: [id number] DOB: Dec 24, 1980 Age: 33 y.o.  Date: 02/04/2014  Chief Complaint: Chief Complaint  Patient presents with  . Anxiety  . Depression  . Manic Behavior  . Follow-up   Subjective: "Donnald Garre been feeling sick."  This patient is a 33 year old married white female who lives with her husband and 3 children-2 daughters ages 79 and 87 and a boy age 62 in [place] she is unemployed and is applying for disability.  The patient returns after 6 weeks. Since I last saw her she had a cholecystectomy at [facility] on February 25. She's been having nausea vomiting and diarrhea. She missed her appointment last month. She ran out of her Brintellix samples and also ran out of Xanax . She did check a lithium level a few days ago and it was only 0.6 which is unusual because it is lower on a higher dose than it was on the lower dose. It could be because she is having diarrhea. I told her we need to let her stomach settle and then recheck in about 2 weeks. She's been very anxious lately without the Xanax she denies suicidal ideation or any current manic symptoms. She's a little bit depressed.   .. Past psychiatric history. Patient has history of significant depression and mood swings. She has at least 4 psychiatric admissions in past.  In 2000-2003 she was admitted due to cutting herself and suicidal thinking. In the past. She had tried Geodon, Depakote, Lamictal, Prozac, Abilify, Lexapro, Celexa, Effexor,  Wellbutrin, BuSpar, Paxil, Risperdal, Ativan and Klonopin. She admitted history of anger, severe mood swings, and irritability.  Her last psychiatric admission was in June 2013.  Psychosocial history. Patient was born and lived in [place]. She's been married twice. She has one son from her previous marriage and a daughter from her second marriage. She used to live by herself however recently her husband move-in and she is working on her marriage.   Allergies: Allergies  Allergen Reactions  . Risperdal [Risperidone] Other (See Comments)    Wt gain  . Macrobid [Nitrofurantoin Monohyd Macro] Itching    Medical History: Past Medical History  Diagnosis Date  . Headache(784.0)   . Bipolar disorder   . Depression   Patient has obesity, and chronic migraine headache. She does not have any recent blood work. She is taking Imitrex as needed for headache.  Her blood work in March 2013 showed hemoglobin A1c is 6 however her liver enzymes and kidney functions are normal.  Her primary care physician is [facility] internal medicine.    Surgical History: Past Surgical History  Procedure Laterality Date  . Tubal ligation    . Cholecystectomy  Feb 2015    Dr. Ladona Horns, [facility]   Family history. Patient endorsed significant history of depression anxiety in her family. Her brother, sister and father has depression and anxiety. family history includes Anxiety disorder in her  brother, father, and sister; Cancer - Other in her paternal grandfather; Depression in her brother, father, and sister. There is no history of ADD / ADHD, Alcohol abuse, Drug abuse, Bipolar disorder, Dementia, OCD, Paranoid behavior, Schizophrenia, Seizures, Sexual abuse, Physical abuse, or Colon cancer. Reviewed all this again today in the session and nothing has changed.  Education background and work history. She has bachelor degree in Programmer, applications.  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 She quit her  job in December 2012 .  She worked briefly at [employer] but quit her job due to feeling overwhelmed.  She recently worked at [employer] as a Scientist, water quality for 2 weeks only.    Alcohol and substance use history. Patient denies any history of alcohol or substance use. She denies a history of DWI.  She has used Xanax for two years and is not willing to stop this for Neurontin for her anxiety despite noting memory loss with the use of Xanax.  Filed Vitals:   02/04/14 1113  BP: 120/80    Mental status examination. Patient is a obese, female, who is casually dressed.  She is anxious but cooperative.  She described her mood as nervous and her affect is congruent  She maintained fair eye contact.  She denies any active or passive suicidal thoughts or homicidal thoughts.  She denies any auditory hallucination or visual hallucination.  She endorse some paranoia about people however she denies any obsessive thoughts or delusions.  There were no flight of ideas or loose association .  Her attention and concentration is fair.  She's alert and oriented x3. Her insight judgment and impulse control is fair.  Lab Results:  Results for orders placed in visit on 01/09/14 (from the past 8736 hour(s))  POCT WET PREP WITH KOH   Collection Time    01/09/14  9:49 AM      Result Value Ref Range   Trichomonas, UA Negative     Clue Cells Wet Prep HPF POC Negative     Epithelial Wet Prep HPF POC Negative     Yeast Wet Prep HPF POC Positive     Bacteria Wet Prep HPF POC Negative     RBC Wet Prep HPF POC Negative     WBC Wet Prep HPF POC Positive     KOH Prep POC Positive    Results for orders placed in visit on 12/20/13 (from the past 8736 hour(s))  COMPREHENSIVE METABOLIC PANEL   Collection Time    12/20/13  3:37 PM      Result Value Ref Range   Sodium 140  135 - 145 mEq/L   Potassium 4.1  3.5 - 5.3 mEq/L   Chloride 106  96 - 112 mEq/L   CO2 27  19 - 32 mEq/L   Glucose, Bld 106 (*) 70 - 99 mg/dL    BUN 12  6 - 23 mg/dL   Creat 0.67  0.50 - 1.10 mg/dL   Total Bilirubin 0.2  0.2 - 1.2 mg/dL   Alkaline Phosphatase 90  39 - 117 U/L   AST 20  0 - 37 U/L   ALT 27  0 - 35 U/L   Total Protein 6.4  6.0 - 8.3 g/dL   Albumin 3.7  3.5 - 5.2 g/dL   Calcium 9.0  8.4 - 10.5 mg/dL  CBC   Collection Time    12/20/13  3:37 PM      Result Value Ref Range  WBC 8.1  4.0 - 10.5 K/uL   RBC 4.27  3.87 - 5.11 MIL/uL   Hemoglobin 12.3  12.0 - 15.0 g/dL   HCT 36.8  36.0 - 46.0 %   MCV 86.2  78.0 - 100.0 fL   MCH 28.8  26.0 - 34.0 pg   MCHC 33.4  30.0 - 36.0 g/dL   RDW 14.7  11.5 - 15.5 %   Platelets 300  150 - 400 K/uL  Results for orders placed in visit on 12/14/13 (from the past 8736 hour(s))  LITHIUM LEVEL   Collection Time    01/31/14  9:44 AM      Result Value Ref Range   Lithium Lvl 0.60 (*) 0.80 - 1.40 mEq/L  Results for orders placed in visit on 12/12/13 (from the past 8736 hour(s))  LITHIUM LEVEL   Collection Time    12/12/13 11:18 AM      Result Value Ref Range   Lithium Lvl 0.70 (*) 0.80 - 1.40 mEq/L  Results for orders placed in visit on 02/19/13 (from the past 8736 hour(s))  LITHIUM LEVEL   Collection Time    03/07/13 12:00 PM      Result Value Ref Range   Lithium Lvl 1.00  0.80 - 1.40 mEq/L   Assessment. Axis I mood disorder NOS, bipolar disorder 1, posttraumatic stress disorder, r/o Major depressive disorder Axis II rule out borderline personality trait. Axis III obesity, chronic headache. Axis IV moderate. Axis V 60-65.  Plan/Discussion: I review her history and previous notes.   She will continue Brintellix 10 mg with dinner. She'll continue  lithium carbonate  To 1200  mg each bedtime. She agrees to get a blood level after 2 weeks. She will continue trazodone 150 mg each bedtime and increase Xanax to 1 mg 4 times a day because of increased anxiety she was start Seroquel 50 mg each bedtime to help with paranoia and sleep . She will start up with counseling here again   Time spent 25 minutes.  More than 50% of the time spent in psychoeducation, counseling and coordination of care. She will return in 4-weeks  MEDICATIONS this encounter: Meds ordered this encounter  Medications  . lithium carbonate 300 MG capsule    Sig: TAKE FOUR TABLETS AT BEDTIME    Dispense:  120 capsule    Refill:  2  . QUEtiapine (SEROQUEL) 50 MG tablet    Sig: Take 1 tablet (50 mg total) by mouth at bedtime.    Dispense:  30 tablet    Refill:  2  . Vortioxetine HBr (BRINTELLIX) 10 MG TABS    Sig: Take 1 tablet (10 mg total) by mouth daily.    Dispense:  30 tablet    Refill:  3  . ALPRAZolam (XANAX) 1 MG tablet    Sig: Take 1 tablet (1 mg total) by mouth 4 (four) times daily.    Dispense:  120 tablet    Refill:  2   Medical Decision Making Problem Points:  Established problem, worsening (2), New problem, with additional work-up planned (4), Review of last therapy session (1) and Review of psycho-social stressors (1) Data Points:  Review or order clinical lab tests (1) Review of medication regiment & side effects (2) Review of new medications or change in dosage (2)  I certify that outpatient services furnished can reasonably be expected to improve the patient's condition.   Levonne Spiller, MD

## 2014-02-05 ENCOUNTER — Telehealth (HOSPITAL_COMMUNITY): Payer: Self-pay | Admitting: *Deleted

## 2014-02-05 NOTE — Telephone Encounter (Signed)
Samples given for now

## 2014-02-06 ENCOUNTER — Telehealth (HOSPITAL_COMMUNITY): Payer: Self-pay | Admitting: *Deleted

## 2014-02-07 NOTE — Telephone Encounter (Signed)
She doesn't need it, samples given

## 2014-02-11 ENCOUNTER — Ambulatory Visit (HOSPITAL_COMMUNITY)
Admission: RE | Admit: 2014-02-11 | Discharge: 2014-02-11 | Disposition: A | Discharge status: Home / Self Care | Payer: BC Managed Care – PPO | Source: Ambulatory Visit | Attending: Gastroenterology | Admitting: Gastroenterology

## 2014-02-11 ENCOUNTER — Encounter (HOSPITAL_COMMUNITY): Payer: Self-pay | Admitting: *Deleted

## 2014-02-11 ENCOUNTER — Encounter (HOSPITAL_COMMUNITY)
Admission: RE | Disposition: A | Discharge status: Home / Self Care | Payer: Self-pay | Source: Ambulatory Visit | Attending: Gastroenterology

## 2014-02-11 DIAGNOSIS — F329 Major depressive disorder, single episode, unspecified: Secondary | ICD-10-CM | POA: Insufficient documentation

## 2014-02-11 DIAGNOSIS — R1319 Other dysphagia: Secondary | ICD-10-CM

## 2014-02-11 DIAGNOSIS — R112 Nausea with vomiting, unspecified: Secondary | ICD-10-CM

## 2014-02-11 DIAGNOSIS — F3289 Other specified depressive episodes: Secondary | ICD-10-CM | POA: Insufficient documentation

## 2014-02-11 DIAGNOSIS — K3189 Other diseases of stomach and duodenum: Secondary | ICD-10-CM | POA: Insufficient documentation

## 2014-02-11 DIAGNOSIS — K269 Duodenal ulcer, unspecified as acute or chronic, without hemorrhage or perforation: Secondary | ICD-10-CM

## 2014-02-11 DIAGNOSIS — Q391 Atresia of esophagus with tracheo-esophageal fistula: Secondary | ICD-10-CM | POA: Insufficient documentation

## 2014-02-11 DIAGNOSIS — K2940 Chronic atrophic gastritis without bleeding: Secondary | ICD-10-CM | POA: Insufficient documentation

## 2014-02-11 DIAGNOSIS — Z79899 Other long term (current) drug therapy: Secondary | ICD-10-CM | POA: Insufficient documentation

## 2014-02-11 DIAGNOSIS — Q393 Congenital stenosis and stricture of esophagus: Secondary | ICD-10-CM

## 2014-02-11 DIAGNOSIS — K2980 Duodenitis without bleeding: Secondary | ICD-10-CM | POA: Insufficient documentation

## 2014-02-11 DIAGNOSIS — R1013 Epigastric pain: Secondary | ICD-10-CM

## 2014-02-11 HISTORY — PX: ESOPHAGOGASTRODUODENOSCOPY (EGD) WITH ESOPHAGEAL DILATION: SHX5812

## 2014-02-11 SURGERY — ESOPHAGOGASTRODUODENOSCOPY (EGD) WITH ESOPHAGEAL DILATION
Anesthesia: Moderate Sedation

## 2014-02-11 MED ORDER — PROMETHAZINE HCL 25 MG/ML IJ SOLN
INTRAMUSCULAR | Status: AC
  Filled 2014-02-11: qty 1

## 2014-02-11 MED ORDER — STERILE WATER FOR IRRIGATION IR SOLN
Status: DC | PRN
  Administered 2014-02-11: 11:00:00

## 2014-02-11 MED ORDER — LIDOCAINE VISCOUS 2 % MT SOLN
OROMUCOSAL | Status: DC | PRN
  Administered 2014-02-11: 3 mL via OROMUCOSAL

## 2014-02-11 MED ORDER — MIDAZOLAM HCL 5 MG/5ML IJ SOLN
INTRAMUSCULAR | Status: DC | PRN
  Administered 2014-02-11 (×4): 2 mg via INTRAVENOUS

## 2014-02-11 MED ORDER — MEPERIDINE HCL 100 MG/ML IJ SOLN
INTRAMUSCULAR | Status: DC | PRN
  Administered 2014-02-11: 25 mg via INTRAVENOUS
  Administered 2014-02-11: 50 mg via INTRAVENOUS
  Administered 2014-02-11: 25 mg via INTRAVENOUS

## 2014-02-11 MED ORDER — LIDOCAINE VISCOUS 2 % MT SOLN
OROMUCOSAL | Status: AC
  Filled 2014-02-11: qty 15

## 2014-02-11 MED ORDER — MIDAZOLAM HCL 5 MG/5ML IJ SOLN
INTRAMUSCULAR | Status: AC
  Filled 2014-02-11: qty 10

## 2014-02-11 MED ORDER — OMEPRAZOLE 20 MG PO CPDR: 20.0000 mg | DELAYED_RELEASE_CAPSULE | Freq: Two times a day (BID) | ORAL | Status: DC

## 2014-02-11 MED ORDER — SODIUM CHLORIDE 0.9 % IV SOLN
INTRAVENOUS | Status: DC
  Administered 2014-02-11: 11:00:00 via INTRAVENOUS

## 2014-02-11 MED ORDER — MEPERIDINE HCL 100 MG/ML IJ SOLN
INTRAMUSCULAR | Status: AC
  Filled 2014-02-11: qty 2

## 2014-02-11 MED ORDER — PROMETHAZINE HCL 25 MG/ML IJ SOLN
25.0000 mg | Freq: Once | INTRAMUSCULAR | Status: AC
  Administered 2014-02-11: 25 mg via INTRAVENOUS

## 2014-02-11 MED ORDER — MINERAL OIL PO OIL
TOPICAL_OIL | ORAL | Status: AC
  Filled 2014-02-11: qty 30

## 2014-02-11 MED ORDER — SODIUM CHLORIDE 0.9 % IJ SOLN
INTRAMUSCULAR | Status: AC
  Filled 2014-02-11: qty 10

## 2014-02-11 NOTE — Discharge Instructions (Signed)
I STRETCHED your esophagus. YOU HAVE NAUSEA ND VOMITING DUE TO SMALL STOMACH ULCERS, gastritis, & DUODENITIS FROM IBUPROFEN. I biopsied your stomach & DUODENUM.    CONTINUE OMEPRAZOLE.  TAKE 30 MINUTES PRIOR TO YOUR MEALS TWICE DAILY.  FOLLOW A LOW FAT DIET. SEE INFO BELOW.  YOUR BIOPSY WILL BE BACK IN 7 DAYS  FOLLOW UP APPT IN 4 MONTHS.    UPPER ENDOSCOPY AFTER CARE Read the instructions outlined below and refer to this sheet in the next week. These discharge instructions provide you with general information on caring for yourself after you leave the hospital. While your treatment has been planned according to the most current medical practices available, unavoidable complications occasionally occur. If you have any problems or questions after discharge, call DR. Jalynn Betzold, [phone].  ACTIVITY  You may resume your regular activity, but move at a slower pace for the next 24 hours.   Take frequent rest periods for the next 24 hours.   Walking will help get rid of the air and reduce the bloated feeling in your belly (abdomen).   No driving for 24 hours (because of the medicine (anesthesia) used during the test).   You may shower.   Do not sign any important legal documents or operate any machinery for 24 hours (because of the anesthesia used during the test).    NUTRITION  Drink plenty of fluids.   You may resume your normal diet as instructed by your doctor.   Begin with a light meal and progress to your normal diet. Heavy or fried foods are harder to digest and may make you feel sick to your stomach (nauseated).   Avoid alcoholic beverages for 24 hours or as instructed.    MEDICATIONS  You may resume your normal medications.   WHAT YOU CAN EXPECT TODAY  Some feelings of bloating in the abdomen.   Passage of more gas than usual.    IF YOU HAD A BIOPSY TAKEN DURING THE UPPER ENDOSCOPY:  Eat a soft diet IF YOU HAVE NAUSEA, BLOATING, ABDOMINAL PAIN, OR  VOMITING.    FINDING OUT THE RESULTS OF YOUR TEST Not all test results are available during your visit. DR. Oneida Alar WILL CALL YOU WITHIN 7 DAYS OF YOUR PROCEDUE WITH YOUR RESULTS. Do not assume everything is normal if you have not heard from DR. Jamaury Gumz IN ONE WEEK, CALL HER OFFICE AT [phone].  SEEK IMMEDIATE MEDICAL ATTENTION AND CALL THE OFFICE: [phone] IF:  You have more than a spotting of blood in your stool.   Your belly is swollen (abdominal distention).   You are nauseated or vomiting.   You have a temperature over 101F.   You have abdominal pain or discomfort that is severe or gets worse throughout the day.  Low-Fat Diet BREADS, CEREALS, PASTA, RICE, DRIED PEAS, AND BEANS These products are high in carbohydrates and most are low in fat. Therefore, they can be increased in the diet as substitutes for fatty foods. They too, however, contain calories and should not be eaten in excess. Cereals can be eaten for snacks as well as for breakfast.   FRUITS AND VEGETABLES It is good to eat fruits and vegetables. Besides being sources of fiber, both are rich in vitamins and some minerals. They help you get the daily allowances of these nutrients. Fruits and vegetables can be used for snacks and desserts.  MEATS Limit lean meat, chicken, [place], and fish to no more than 6 ounces per day. Beef, Pork, and [employer]  lean cuts of beef, pork, and lamb. Lean cuts include:  Extra-lean ground beef.  Arm roast.  Sirloin tip.  Center-cut ham.  Round steak.  Loin chops.  Rump roast.  Tenderloin.  Trim all fat off the outside of meats before cooking. It is not necessary to severely decrease the intake of red meat, but lean choices should be made. Lean meat is rich in protein and contains a highly absorbable form of iron. Premenopausal women, in particular, should avoid reducing lean red meat because this could increase the risk for low red blood cells (iron-deficiency anemia).  Chicken  and [place] These are good sources of protein. The fat of poultry can be reduced by removing the skin and underlying fat layers before cooking. Chicken and [place] can be substituted for lean red meat in the diet. Poultry should not be fried or covered with high-fat sauces. Fish and Shellfish Fish is a good source of protein. Shellfish contain cholesterol, but they usually are low in saturated fatty acids. The preparation of fish is important. Like chicken and [place], they should not be fried or covered with high-fat sauces. EGGS Egg whites contain no fat or cholesterol. They can be eaten often. Try 1 to 2 egg whites instead of whole eggs in recipes or use egg substitutes that do not contain yolk. MILK AND DAIRY PRODUCTS Use skim or 1% milk instead of 2% or whole milk. Decrease whole milk, natural, and processed cheeses. Use nonfat or low-fat (2%) cottage cheese or low-fat cheeses made from vegetable oils. Choose nonfat or low-fat (1 to 2%) yogurt. Experiment with evaporated skim milk in recipes that call for heavy cream. Substitute low-fat yogurt or low-fat cottage cheese for sour cream in dips and salad dressings. Have at least 2 servings of low-fat dairy products, such as 2 glasses of skim (or 1%) milk each day to help get your daily calcium intake. FATS AND OILS Reduce the total intake of fats, especially saturated fat. Butterfat, lard, and beef fats are high in saturated fat and cholesterol. These should be avoided as much as possible. Vegetable fats do not contain cholesterol, but certain vegetable fats, such as coconut oil, palm oil, and palm kernel oil are very high in saturated fats. These should be limited. These fats are often used in bakery goods, processed foods, popcorn, oils, and nondairy creamers. Vegetable shortenings and some peanut butters contain hydrogenated oils, which are also saturated fats. Read the labels on these foods and check for saturated vegetable oils. Unsaturated vegetable  oils and fats do not raise blood cholesterol. However, they should be limited because they are fats and are high in calories. Total fat should still be limited to 30% of your daily caloric intake. Desirable liquid vegetable oils are corn oil, cottonseed oil, olive oil, canola oil, safflower oil, soybean oil, and sunflower oil. Peanut oil is not as good, but small amounts are acceptable. Buy a heart-healthy tub margarine that has no partially hydrogenated oils in the ingredients. Mayonnaise and salad dressings often are made from unsaturated fats, but they should also be limited because of their high calorie and fat content. Seeds, nuts, peanut butter, olives, and avocados are high in fat, but the fat is mainly the unsaturated type. These foods should be limited mainly to avoid excess calories and fat. OTHER EATING TIPS Snacks  Most sweets should be limited as snacks. They tend to be rich in calories and fats, and their caloric content outweighs their nutritional value. Some good choices in snacks  are graham crackers, melba toast, soda crackers, bagels (no egg), English muffins, fruits, and vegetables. These snacks are preferable to snack crackers, [place] fries, TORTILLA CHIPS, and POTATO chips. Popcorn should be air-popped or cooked in small amounts of liquid vegetable oil. Desserts Eat fruit, low-fat yogurt, and fruit ices instead of pastries, cake, and cookies. Sherbet, angel food cake, gelatin dessert, frozen low-fat yogurt, or other frozen products that do not contain saturated fat (pure fruit juice bars, frozen ice pops) are also acceptable.  COOKING METHODS Choose those methods that use little or no fat. They include: Poaching.  Braising.  Steaming.  Grilling.  Baking.  Stir-frying.  Broiling.  Microwaving.  Foods can be cooked in a nonstick pan without added fat, or use a nonfat cooking spray in regular cookware. Limit fried foods and avoid frying in saturated fat. Add moisture to lean meats  by using water, broth, cooking wines, and other nonfat or low-fat sauces along with the cooking methods mentioned above. Soups and stews should be chilled after cooking. The fat that forms on top after a few hours in the refrigerator should be skimmed off. When preparing meals, avoid using excess salt. Salt can contribute to raising blood pressure in some people.  EATING AWAY FROM HOME Order entres, potatoes, and vegetables without sauces or butter. When meat exceeds the size of a deck of cards (3 to 4 ounces), the rest can be taken home for another meal. Choose vegetable or fruit salads and ask for low-calorie salad dressings to be served on the side. Use dressings sparingly. Limit high-fat toppings, such as bacon, crumbled eggs, cheese, sunflower seeds, and olives. Ask for heart-healthy tub margarine instead of butter.    Gastritis/DUODENITIS  ULCERS/GASTRITIS ARE inflammation (the body's way of reacting to injury and/or infection) of the stomach. DUODENITIS is an inflammation (the body's way of reacting to injury and/or infection) of the FIRST PART OF THE SMALL INTESTINES. It is often caused by bacterial (germ) infections. It can also be caused BY ASPIRIN, BC/GOODY POWDER'S, (IBUPROFEN) MOTRIN, OR ALEVE (NAPROXEN), chemicals (including alcohol), SPICY FOODS, and medications. This illness may be associated with generalized malaise (feeling tired, not well), UPPER ABDOMINAL STOMACH cramps, and fever. One common bacterial cause of gastritis is an organism known as H. Pylori. This can be treated with antibiotics.    ESOPHAGEAL STRICTURE  Esophageal strictures can be caused by stomach acid backing up into the tube that carries food from the mouth down to the stomach (lower esophagus).  TREATMENT There are a number of medicines used to treat reflux/stricture, including: Antacids.  ZANTAC Proton-pump inhibitors: PRILOSEC   HOME CARE INSTRUCTIONS Eat 2-3 hours before going to bed.  Try to reach  and maintain a healthy weight.  Do not eat just a few very large meals. Instead, eat 4 TO 6 smaller meals throughout the day.  Try to identify foods and beverages that make your symptoms worse, and avoid these.  Avoid tight clothing.  Do not exercise right after eating.

## 2014-02-11 NOTE — Progress Notes (Signed)
REVIEWED.  

## 2014-02-11 NOTE — H&P (Signed)
Primary Care Physician:  No PCP Per Patient Primary Gastroenterologist:  Dr. Oneida Alar  Pre-Procedure History & Physical: HPI:  Teresa Tran is a 33 y.o. female here for DYSPHAGIA.  Past Medical History  Diagnosis Date  . Headache(784.0)   . Bipolar disorder   . Depression     Past Surgical History  Procedure Laterality Date  . Tubal ligation    . Cholecystectomy  Feb 2015    Dr. Ladona Horns, [facility]    Prior to Admission medications   Medication Sig Start Date End Date Taking? Authorizing Provider  ALPRAZolam Duanne Moron) 1 MG tablet Take 1 tablet (1 mg total) by mouth 4 (four) times daily. 02/04/14 02/04/15 Yes Levonne Spiller, MD  dicyclomine (BENTYL) 10 MG capsule Take 1 capsule (10 mg total) by mouth 4 (four) times daily -  before meals and at bedtime. 01/30/14  Yes Orvil Feil, NP  omeprazole (PRILOSEC) 20 MG capsule Take 1 capsule (20 mg total) by mouth daily. 30 minutes before breakfast daily. 01/30/14  Yes Levonne Spiller, MD  ondansetron (ZOFRAN) 4 MG tablet Take 1 tablet (4 mg total) by mouth every 8 (eight) hours as needed for nausea or vomiting. 01/30/14  Yes Orvil Feil, NP  lithium carbonate 300 MG capsule TAKE FOUR TABLETS AT BEDTIME 02/04/14   Levonne Spiller, MD  QUEtiapine (SEROQUEL) 50 MG tablet Take 1 tablet (50 mg total) by mouth at bedtime. 02/04/14 02/04/15  Levonne Spiller, MD  Vortioxetine HBr (BRINTELLIX) 10 MG TABS Take 1 tablet (10 mg total) by mouth daily. 02/04/14   Levonne Spiller, MD    Allergies as of 01/30/2014 - Review Complete 01/30/2014  Allergen Reaction Noted  . Risperdal [risperidone] Other (See Comments) 10/11/2012  . Macrobid [nitrofurantoin monohyd macro] Itching 01/09/2014    Family History  Problem Relation Age of Onset  . Depression Father   . Anxiety disorder Father   . Depression Sister   . Anxiety disorder Sister   . Depression Brother   . Anxiety disorder Brother   . ADD / ADHD Neg Hx   . Alcohol abuse Neg Hx   . Drug abuse Neg Hx   . Bipolar disorder  Neg Hx   . Dementia Neg Hx   . OCD Neg Hx   . Paranoid behavior Neg Hx   . Schizophrenia Neg Hx   . Seizures Neg Hx   . Sexual abuse Neg Hx   . Physical abuse Neg Hx   . Colon cancer Neg Hx   . Cancer - Other Paternal Grandfather     History   Social History  . Marital Status: Married    Spouse Name: N/A    Number of Children: N/A  . Years of Education: N/A   Social History Main Topics  . Smoking status: Never Smoker   . Smokeless tobacco: Never Used  . Alcohol Use: No  . Drug Use: No  . Sexual Activity: Yes    Birth Control/ Protection: Surgical     Comment: tubal   Review of Systems: See HPI, otherwise negative ROS   Physical Exam: LMP 12/09/2013 General:   Alert,  pleasant and cooperative in NAD Head:  Normocephalic and atraumatic. Neck:  Supple; Lungs:  Clear throughout to auscultation.    Heart:  Regular rate and rhythm. Abdomen:  Soft, nontender and nondistended. Normal bowel sounds, without guarding, and without rebound.   Neurologic:  Alert and  oriented x4;  grossly normal neurologically.  Impression/Plan:    DYSPHAGIA  PLAN:  EGD/DIL TODAY

## 2014-02-12 ENCOUNTER — Encounter: Payer: Self-pay | Admitting: Adult Health

## 2014-02-12 ENCOUNTER — Ambulatory Visit (INDEPENDENT_AMBULATORY_CARE_PROVIDER_SITE_OTHER): Payer: BC Managed Care – PPO | Admitting: Adult Health

## 2014-02-12 ENCOUNTER — Other Ambulatory Visit: Payer: Self-pay | Admitting: Adult Health

## 2014-02-12 VITALS — BP 120/80 | HR 76 | Ht 60.0 in | Wt 216.0 lb

## 2014-02-12 DIAGNOSIS — B379 Candidiasis, unspecified: Secondary | ICD-10-CM

## 2014-02-12 DIAGNOSIS — Z01419 Encounter for gynecological examination (general) (routine) without abnormal findings: Secondary | ICD-10-CM

## 2014-02-12 DIAGNOSIS — N898 Other specified noninflammatory disorders of vagina: Secondary | ICD-10-CM

## 2014-02-12 DIAGNOSIS — N899 Noninflammatory disorder of vagina, unspecified: Secondary | ICD-10-CM

## 2014-02-12 HISTORY — DX: Candidiasis, unspecified: B37.9

## 2014-02-12 HISTORY — DX: Other specified noninflammatory disorders of vagina: N89.8

## 2014-02-12 LAB — COMPREHENSIVE METABOLIC PANEL
ALK PHOS: 75 U/L (ref 39–117)
ALT: 23 U/L (ref 0–35)
AST: 22 U/L (ref 0–37)
Albumin: 3.6 g/dL (ref 3.5–5.2)
BILIRUBIN TOTAL: 0.2 mg/dL (ref 0.2–1.2)
BUN: 6 mg/dL (ref 6–23)
CO2: 27 mEq/L (ref 19–32)
CREATININE: 0.54 mg/dL (ref 0.50–1.10)
Calcium: 8.8 mg/dL (ref 8.4–10.5)
Chloride: 102 mEq/L (ref 96–112)
Glucose, Bld: 94 mg/dL (ref 70–99)
Potassium: 4 mEq/L (ref 3.5–5.3)
Sodium: 140 mEq/L (ref 135–145)
Total Protein: 6.1 g/dL (ref 6.0–8.3)

## 2014-02-12 LAB — CBC
HCT: 34.1 % — ABNORMAL LOW (ref 36.0–46.0)
Hemoglobin: 11.4 g/dL — ABNORMAL LOW (ref 12.0–15.0)
MCH: 27.5 pg (ref 26.0–34.0)
MCHC: 33.4 g/dL (ref 30.0–36.0)
MCV: 82.2 fL (ref 78.0–100.0)
PLATELETS: 283 10*3/uL (ref 150–400)
RBC: 4.15 MIL/uL (ref 3.87–5.11)
RDW: 15.1 % (ref 11.5–15.5)
WBC: 6.7 10*3/uL (ref 4.0–10.5)

## 2014-02-12 LAB — POCT WET PREP (WET MOUNT)

## 2014-02-12 LAB — LIPID PANEL
CHOL/HDL RATIO: 3.4 ratio
Cholesterol: 180 mg/dL (ref 0–200)
HDL: 53 mg/dL (ref >39)
LDL Cholesterol: 100 mg/dL — ABNORMAL HIGH (ref 0–99)
Triglycerides: 133 mg/dL (ref <150)
VLDL: 27 mg/dL (ref 0–40)

## 2014-02-12 LAB — TSH: TSH: 3.067 u[IU]/mL (ref 0.350–4.500)

## 2014-02-12 MED ORDER — FLUCONAZOLE 150 MG PO TABS: ORAL_TABLET | ORAL | Status: DC

## 2014-02-12 NOTE — Patient Instructions (Signed)
Monilial Vaginitis Vaginitis in a soreness, swelling and redness (inflammation) of the vagina and vulva. Monilial vaginitis is not a sexually transmitted infection. CAUSES  Yeast vaginitis is caused by yeast (candida) that is normally found in your vagina. With a yeast infection, the candida has overgrown in number to a point that upsets the chemical balance. SYMPTOMS   White, thick vaginal discharge.  Swelling, itching, redness and irritation of the vagina and possibly the lips of the vagina (vulva).  Burning or painful urination.  Painful intercourse. DIAGNOSIS  Things that may contribute to monilial vaginitis are:  Postmenopausal and virginal states.  Pregnancy.  Infections.  Being tired, sick or stressed, especially if you had monilial vaginitis in the past.  Diabetes. Good control will help lower the chance.  Birth control pills.  Tight fitting garments.  Using bubble bath, feminine sprays, douches or deodorant tampons.  Taking certain medications that kill germs (antibiotics).  Sporadic recurrence can occur if you become ill. TREATMENT  Your caregiver will give you medication.  There are several kinds of anti monilial vaginal creams and suppositories specific for monilial vaginitis. For recurrent yeast infections, use a suppository or cream in the vagina 2 times a week, or as directed.  Anti-monilial or steroid cream for the itching or irritation of the vulva may also be used. Get your caregiver's permission.  Painting the vagina with methylene blue solution may help if the monilial cream does not work.  Eating yogurt may help prevent monilial vaginitis. HOME CARE INSTRUCTIONS   Finish all medication as prescribed.  Do not have sex until treatment is completed or after your caregiver tells you it is okay.  Take warm sitz baths.  Do not douche.  Do not use tampons, especially scented ones.  Wear cotton underwear.  Avoid tight pants and panty  hose.  Tell your sexual partner that you have a yeast infection. They should go to their caregiver if they have symptoms such as mild rash or itching.  Your sexual partner should be treated as well if your infection is difficult to eliminate.  Practice safer sex. Use condoms.  Some vaginal medications cause latex condoms to fail. Vaginal medications that harm condoms are:  Cleocin cream.  Butoconazole (Femstat).  Terconazole (Terazol) vaginal suppository.  Miconazole (Monistat) (may be purchased over the counter). SEEK MEDICAL CARE IF:   You have a temperature by mouth above 102 F (38.9 C).  The infection is getting worse after 2 days of treatment.  The infection is not getting better after 3 days of treatment.  You develop blisters in or around your vagina.  You develop vaginal bleeding, and it is not your menstrual period.  You have pain when you urinate.  You develop intestinal problems.  You have pain with sexual intercourse. Document Released: 08/04/2005 Document Revised: 01/17/2012 Document Reviewed: 04/18/2009 [facility] Patient Information 2014 [place], [place]. Physical and pap in 1 year Mammogram at 9

## 2014-02-12 NOTE — Op Note (Signed)
[facility] [address] [place], [address]   ENDOSCOPY PROCEDURE REPORT  PATIENT: Teresa Tran, Teresa Tran  MR#: [id number] BIRTHDATE: [birth year] , 32  yrs. old GENDER: Female  ENDOSCOPIST: Barney Drain, MD REFFERED BY:  PROCEDURE DATE:  02/11/2014 PROCEDURE:   EGD with biopsy and EGD with dilatation over guidewire   INDICATIONS:1.  dysphagia.   2.  dyspepsia. USES 1-2 IBUPROFEN DAILY. MEDICATIONS: Demerol 100 mg IV and Versed 5 mg IV TOPICAL ANESTHETIC: Viscous Xylocaine  DESCRIPTION OF PROCEDURE:   After the risks benefits and alternatives of the procedure were thoroughly explained, informed consent was obtained.  The EG-[id number] ([id number])  endoscope was introduced through the mouth and advanced to the second portion of the duodenum. The instrument was slowly withdrawn as the mucosa was carefully examined.  Prior to withdrawal of the scope, the guidwire was placed.  The esophagus was dilated successfully.  The patient was recovered in endoscopy and discharged home in satisfactory condition.   ESOPHAGUS: PROBABLE PROXIMAL ESOPHAGEAL WEB.  OTHERWISE NL ESOPHAGUS.   STOMACH: Multiple small shallow ulcers were found in the gastric antrum.  Biopsies were taken around the ulcers. DUODENUM: Three small shallow ulcers were found.   The duodenal mucosa showed no abnormalities in the 2nd part of the duodenum. Cold forcep biopsies were taken in the second portion.   Dilation was then performed at the esophagus Dilator: Savary over guidewire Size(s): 14-16 MM Resistance: minimal TO MODERATE  COMPLICATIONS: There were no complications.  ENDOSCOPIC IMPRESSION: 1.   PROBABLE PROXIMAL ESOPHAGEAL WEB 2.   DYSPEPSIA DUE TO PUD, LIKELY NSAID INDUCED 3.   DYSPHAGIA MAY BE DUE TO PROXIMAL ESOPHAGEAL WEB AND/OR REFLUX  RECOMMENDATIONS: CONTINUE OMEPRAZOLE.  INCREASE TO 30 MINUTES PRIOR TO YOUR MEALS TWICE DAILY. FOLLOW A LOW FAT DIET. BIOPSY WILL BE BACK IN 7 DAYS  FOLLOW UP  APPT IN 4 MONTHS. NEEDS REPEAT EGD AFTER NEXT OPV.      _______________________________ Lorrin MaisBarney Drain, MD 02/12/2014 11:03 AM

## 2014-02-12 NOTE — Progress Notes (Signed)
Patient ID: Teresa Tran, female   DOB: 1980/11/26, 33 y.o.   MRN: [id number] History of Present Illness: Teresa Tran is a 33 year old white female, married in for physical, had normal pap with negative HPV 04/11/12.   Current Medications, Allergies, Past Medical History, Past Surgical History, Family History and Social History were reviewed in [employer] record.   Past Medical History  Diagnosis Date  . Headache(784.0)   . Bipolar disorder   . Depression   . Ulcer   . Vaginal irritation 02/12/2014  . Yeast infection 02/12/2014  . Obesity    Past Surgical History  Procedure Laterality Date  . Tubal ligation    . Cholecystectomy  Feb 2015    Dr. Ladona Horns, [facility]  Current outpatient prescriptions:ALPRAZolam Duanne Moron) 1 MG tablet, Take 1 tablet (1 mg total) by mouth 4 (four) times daily., Disp: 120 tablet, Rfl: 2;  dicyclomine (BENTYL) 10 MG capsule, Take 1 capsule (10 mg total) by mouth 4 (four) times daily -  before meals and at bedtime., Disp: 120 capsule, Rfl: 3;  lithium carbonate 300 MG capsule, TAKE FOUR TABLETS AT BEDTIME, Disp: 120 capsule, Rfl: 2 omeprazole (PRILOSEC) 20 MG capsule, Take 1 capsule (20 mg total) by mouth 2 (two) times daily before a meal. TAKE 30 MINS PRIOR TO MEALS., Disp: 30 capsule, Rfl: 3;  ondansetron (ZOFRAN) 4 MG tablet, Take 1 tablet (4 mg total) by mouth every 8 (eight) hours as needed for nausea or vomiting., Disp: 30 tablet, Rfl: 1;  QUEtiapine (SEROQUEL) 50 MG tablet, Take 1 tablet (50 mg total) by mouth at bedtime., Disp: 30 tablet, Rfl: 2 Vortioxetine HBr (BRINTELLIX) 10 MG TABS, Take 1 tablet (10 mg total) by mouth daily., Disp: 30 tablet, Rfl: 3;  fluconazole (DIFLUCAN) 150 MG tablet, Take 1 now and 1 in 3 days if needed, Disp: 2 tablet, Rfl: 1  Review of Systems: Patient denies any  blurred vision, shortness of breath, chest pain, abdominal pain, problems with bowel movements, urination, or intercourse. No joint pain, moods good at present, has  some headaches and vaginal irritation, recently had GB surgery on 01/02/14 and had esophagus stretched yesterday by Dr Oneida Alar.She has irregular periods.    Physical Exam:BP 120/80  Pulse 76  Ht 5' (1.524 m)  Wt 216 lb (97.977 kg)  BMI 42.18 kg/m2  LMP 12/13/2013 General:  Well developed, well nourished, no acute distress Skin:  Warm and dry Neck:  Midline trachea, normal thyroid Lungs; Clear to auscultation bilaterally Breast:  No dominant palpable mass, retraction, or nipple discharge Cardiovascular: Regular rate and rhythm Abdomen:  Soft, non tender, no hepatosplenomegaly, has granuloma at navel, had GB surgery and has Follow up with surgeon soon, he has revised once. Pelvic:  External genitalia is normal in appearance, except red at clitorus.  The vagina is normal in appearance, some redness, white discharge.The cervix is bulbous.  Uterus is felt to be normal size, shape, and contour.  No  adnexal masses or tenderness noted.wet prep +yeast and few WBC. Extremities:  No swelling or varicosities noted Psych: Alert and cooperative, seems happy Has not had labs lately, no PCP at present.  Impression: Yearly gyn exam no pap Vaginal irritation Yeast     Plan: Rx diflucan 150 mg #2 1 now and 1 in 3 days  Pap and physical in 1 year Mammogram at 41  Check CBC,CMP,TSH and lipids  Review handout on yeast

## 2014-02-13 ENCOUNTER — Telehealth: Payer: Self-pay | Admitting: *Deleted

## 2014-02-13 NOTE — Telephone Encounter (Signed)
Please review

## 2014-02-13 NOTE — Telephone Encounter (Signed)
Pt aware of labs, take MV with iron 

## 2014-02-14 ENCOUNTER — Encounter (HOSPITAL_COMMUNITY): Payer: Self-pay | Admitting: Gastroenterology

## 2014-02-18 ENCOUNTER — Telehealth: Payer: Self-pay | Admitting: *Deleted

## 2014-02-18 NOTE — Telephone Encounter (Signed)
Pt called wanting to get her results from her procedure last Monday, I made pt aware it takes 7 business days to get the results back and that I would send a message to the nurse. Please Advise [phone]

## 2014-02-19 NOTE — Telephone Encounter (Signed)
LMOM for a return call. I have not received the report to me of these results.

## 2014-02-19 NOTE — Telephone Encounter (Signed)
Teresa Tran, pt would like to know results of her EGD/PATH. Please advise in Dr. Nona Dell absence.

## 2014-02-19 NOTE — Telephone Encounter (Signed)
Pt return call and ask if you could call her back.

## 2014-02-19 NOTE — Telephone Encounter (Signed)
She had 3 small shallow ulcers, likely secondary to NSAID use.  Path with gastritis and duodenitis. No H.pylori. Continue omeprazole BID, follow-up in 3-4 months.

## 2014-02-20 NOTE — Telephone Encounter (Signed)
Reminder in epic °

## 2014-02-20 NOTE — Telephone Encounter (Signed)
Pt returned call and was informed of the results.

## 2014-02-20 NOTE — Telephone Encounter (Signed)
LMOM for a return call.  

## 2014-02-22 ENCOUNTER — Ambulatory Visit (INDEPENDENT_AMBULATORY_CARE_PROVIDER_SITE_OTHER): Payer: BC Managed Care – PPO | Admitting: Psychiatry

## 2014-02-22 DIAGNOSIS — F3162 Bipolar disorder, current episode mixed, moderate: Secondary | ICD-10-CM
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
## 2014-02-22 NOTE — Patient Instructions (Signed)
Discussed orally 

## 2014-02-26 LAB — LITHIUM LEVEL: LITHIUM LVL: 1.6 meq/L — AB (ref 0.80–1.40)

## 2014-02-27 ENCOUNTER — Telehealth (HOSPITAL_COMMUNITY): Payer: Self-pay | Admitting: *Deleted

## 2014-02-27 ENCOUNTER — Telehealth (HOSPITAL_COMMUNITY): Payer: Self-pay | Admitting: Psychiatry

## 2014-02-27 NOTE — Telephone Encounter (Signed)
Called - left message

## 2014-02-27 NOTE — Telephone Encounter (Signed)
Lab called, value is only 0.1 over upper limit

## 2014-02-28 ENCOUNTER — Telehealth: Payer: Self-pay | Admitting: Gastroenterology

## 2014-02-28 NOTE — Telephone Encounter (Signed)
Reminder in epic °

## 2014-02-28 NOTE — Telephone Encounter (Signed)
Please call pt. HER stomach Bx shows ULCERS, gastritis, AND DUODENITIS DUE TO IBUPROFEN.    CONTINUE OMEPRAZOLE.  TAKE 30 MINUTES PRIOR TO YOUR MEALS TWICE DAILY. FOLLOW A LOW FAT DIET.   FOLLOW UP APPT IN 4 MONTHS E30 PUD. SHE WILL NEED A REPEAT UPPER ENDOSCOPY AFTER HER NEXT VISIT.

## 2014-03-01 ENCOUNTER — Ambulatory Visit (INDEPENDENT_AMBULATORY_CARE_PROVIDER_SITE_OTHER): Payer: BC Managed Care – PPO | Admitting: Psychiatry

## 2014-03-01 ENCOUNTER — Encounter (HOSPITAL_COMMUNITY): Payer: Self-pay | Admitting: Psychiatry

## 2014-03-01 VITALS — BP 118/80 | Ht 60.0 in | Wt 214.0 lb

## 2014-03-01 DIAGNOSIS — F3162 Bipolar disorder, current episode mixed, moderate: Secondary | ICD-10-CM

## 2014-03-01 DIAGNOSIS — F39 Unspecified mood [affective] disorder: Secondary | ICD-10-CM

## 2014-03-01 DIAGNOSIS — F319 Bipolar disorder, unspecified: Secondary | ICD-10-CM

## 2014-03-01 DIAGNOSIS — F4310 Post-traumatic stress disorder, unspecified: Secondary | ICD-10-CM

## 2014-03-01 MED ORDER — QUETIAPINE FUMARATE 25 MG PO TABS: ORAL_TABLET | ORAL | Status: DC
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
## 2014-03-04 NOTE — Telephone Encounter (Signed)
Reminder in EPIC 

## 2014-03-08 ENCOUNTER — Ambulatory Visit (INDEPENDENT_AMBULATORY_CARE_PROVIDER_SITE_OTHER): Payer: BC Managed Care – PPO | Admitting: Psychiatry

## 2014-03-08 DIAGNOSIS — F3162 Bipolar disorder, current episode mixed, moderate: Secondary | ICD-10-CM

## 2014-03-08 NOTE — Telephone Encounter (Signed)
Pt returned call and was informed of results.  
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
## 2014-03-08 NOTE — Patient Instructions (Signed)
Discussed orally 

## 2014-03-08 NOTE — Telephone Encounter (Signed)
LMOM to call.

## 2014-03-15 ENCOUNTER — Encounter (HOSPITAL_COMMUNITY): Payer: Self-pay | Admitting: Psychiatry

## 2014-03-15 ENCOUNTER — Other Ambulatory Visit (HOSPITAL_COMMUNITY): Payer: Self-pay | Admitting: Psychiatry

## 2014-03-15 ENCOUNTER — Ambulatory Visit (INDEPENDENT_AMBULATORY_CARE_PROVIDER_SITE_OTHER): Payer: BC Managed Care – PPO | Admitting: Psychiatry

## 2014-03-15 ENCOUNTER — Telehealth (HOSPITAL_COMMUNITY): Payer: Self-pay | Admitting: *Deleted

## 2014-03-15 VITALS — BP 130/88 | Ht 60.0 in | Wt 211.0 lb

## 2014-03-15 DIAGNOSIS — F39 Unspecified mood [affective] disorder: Secondary | ICD-10-CM

## 2014-03-15 DIAGNOSIS — F319 Bipolar disorder, unspecified: Secondary | ICD-10-CM

## 2014-03-15 DIAGNOSIS — F4310 Post-traumatic stress disorder, unspecified: Secondary | ICD-10-CM

## 2014-03-15 DIAGNOSIS — F3162 Bipolar disorder, current episode mixed, moderate: Secondary | ICD-10-CM

## 2014-03-15 LAB — LITHIUM LEVEL: Lithium Lvl: 0.7 mEq/L — ABNORMAL LOW (ref 0.80–1.40)

## 2014-03-15 MED ORDER — QUETIAPINE FUMARATE 50 MG PO TABS: ORAL_TABLET | ORAL | Status: DC

## 2014-03-15 MED ORDER — VORTIOXETINE HBR 20 MG PO TABS: 20.0000 mg | ORAL_TABLET | Freq: Every day | ORAL | Status: DC

## 2014-03-15 MED ORDER — LITHIUM CARBONATE 300 MG PO TABS: ORAL_TABLET | ORAL | Status: DC

## 2014-03-15 MED ORDER — ALPRAZOLAM 1 MG PO TABS: 1.0000 mg | ORAL_TABLET | Freq: Four times a day (QID) | ORAL | Status: DC

## 2014-03-15 NOTE — Progress Notes (Signed)
Patient ID: Teresa Tran, female   DOB: 08/06/1981, 33 y.o.   MRN: [id number] Patient ID: Teresa Tran, female   DOB: [birth year], 33 y.o.   MRN: [id number] Patient ID: Teresa Tran, female   DOB: Mar 29, 1981, 33 y.o.   MRN: [id number] Patient ID: Teresa Tran, female   DOB: Dec 05, 1980, 33 y.o.   MRN: [id number] Patient ID: Teresa Tran, female   DOB: [birth year], 33 y.o.   MRN: [id number] Patient ID: Teresa Tran, female   DOB: 11-20-1980, 33 y.o.   MRN: [id number] Patient ID: Teresa Tran, female   DOB: August 10, 1981, 33 y.o.   MRN: [id number] Patient ID: Teresa Tran, female   DOB: [birth year], 33 y.o.   MRN: [id number] [place] [address] Progress Note Teresa Tran MRN: [id number] DOB: 04-20-1981 Age: 33 y.o.  Date: 03/15/2014  Chief Complaint: Chief Complaint  Patient presents with  . Anxiety  . Depression  . Manic Behavior  . Follow-up   Subjective: My thoughts are still racing."  This patient is a 33 year old married white female who lives with her husband and 3 children-2 daughters ages 30 and 59 and a boy age 56 in [place] she is unemployed and is applying for disability.  The patient returns after 3 weeks. Her lithium level on 900 mg per day is now down to 0.7 which is in a better range . However she still claims to have racing thoughts significant anxiety irritability and anger. She's no longer cutting herself and denies suicidal ideation. She and her husband stay fairly isolated and don't have friends or get out and do much. Doesn't participate in her children's activities. She keeps wishing that her behavior and mood would change. She has tried so many different antidepressants and mood stabilizers and none have totally taken the symptoms away. I told her we can trying increasing Seroquel  .Marland Kitchen Past psychiatric history. Patient has history of significant depression and mood swings. She has at least 4 psychiatric admissions in past.  In 2000-2003 she was admitted due to cutting herself and suicidal  thinking. In the past. She had tried Geodon, Depakote, Lamictal, Prozac, Abilify, Lexapro, Celexa, Effexor, Wellbutrin, BuSpar, Paxil, Risperdal, Ativan and Klonopin. She admitted history of anger, severe mood swings, and irritability.  Her last psychiatric admission was in June 2013.  Psychosocial history. Patient was born and lived in [place]. She's been married twice. She has one son from her previous marriage and a daughter from her second marriage. She used to live by herself however recently her husband move-in and she is working on her marriage.   Allergies: Allergies  Allergen Reactions  . Risperdal [Risperidone] Other (See Comments)    Wt gain  . Macrobid [Nitrofurantoin Monohyd Macro] Itching    Medical History: Past Medical History  Diagnosis Date  . Headache(784.0)   . Bipolar disorder   . Depression   . Ulcer   . Vaginal irritation 02/12/2014  . Yeast infection 02/12/2014  . Obesity   Patient has obesity, and chronic migraine headache. . She is taking Imitrex as needed for headache.  Her blood work in March 2013 showed hemoglobin A1c is 6 however her liver enzymes and kidney functions are normal.  Her primary care physician is [facility] internal medicine.    Surgical History: Past Surgical History  Procedure Laterality Date  . Tubal ligation    . Cholecystectomy  Feb 2015    Dr. Ladona Horns, [place]  . Esophagogastroduodenoscopy (egd) with esophageal dilation N/A  02/11/2014    Procedure: ESOPHAGOGASTRODUODENOSCOPY (EGD) WITH ESOPHAGEAL DILATION;  Surgeon: West Bali, MD;  Location: [facility];  Service: Endoscopy;  Laterality: N/A;  11:00-rescheduled to 11:45 Soledad Gerlach to notify pt   Family history. Patient endorsed significant history of depression anxiety in her family. Her brother, sister and father has depression and anxiety. family history includes Anxiety disorder in her brother, father, and sister; Cancer - Other in her paternal grandfather; Depression in her  brother, father, and sister. There is no history of ADD / ADHD, Alcohol abuse, Drug abuse, Bipolar disorder, Dementia, OCD, Paranoid behavior, Schizophrenia, Seizures, Sexual abuse, Physical abuse, or Colon cancer. Reviewed all this again today in the session and nothing has changed.  Education background and work history. She has bachelor degree in Presenter, broadcasting.  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 She quit her job in December 2012 .  She worked briefly at [employer] but quit her job due to feeling overwhelmed.  She recently worked at [employer] as a Conservation officer, nature for 2 weeks only.    Alcohol and substance use history. Patient denies any history of alcohol or substance use. She denies a history of DWI.  She has used Xanax for two years and is not willing to stop this for Neurontin for her anxiety despite noting memory loss with the use of Xanax.  Filed Vitals:   03/15/14 0947  BP: 130/88    Mental status examination. Patient is a obese, female, who is casually dressed.  She is anxious but cooperative.  She described her mood as angry and sad and her affect is congruent  She maintained fair eye contact.  She denies any active or passive suicidal thoughts or homicidal thoughts.  She denies any auditory hallucination or visual hallucination.  She endorse some paranoia about people talking about her behind her back. She i seems to be on  edge but she is more talkative than she has been in the past There were no flight of ideas or loose association .  Her attention and concentration is fair.  She's alert and oriented x3. Her insight judgment and impulse control is fair.  Lab Results:  Results for orders placed in visit on 03/01/14 (from the past 8736 hour(s))  LITHIUM LEVEL   Collection Time    03/14/14  9:39 AM      Result Value Ref Range   Lithium Lvl 0.70 (*) 0.80 - 1.40 mEq/L  Results for orders placed in visit on 02/12/14 (from the past 8736 hour(s))  CBC    Collection Time    02/12/14 12:01 PM      Result Value Ref Range   WBC 6.7  4.0 - 10.5 K/uL   RBC 4.15  3.87 - 5.11 MIL/uL   Hemoglobin 11.4 (*) 12.0 - 15.0 g/dL   HCT 16.1 (*) 09.6 - 04.5 %   MCV 82.2  78.0 - 100.0 fL   MCH 27.5  26.0 - 34.0 pg   MCHC 33.4  30.0 - 36.0 g/dL   RDW 40.9  81.1 - 91.4 %   Platelets 283  150 - 400 K/uL  COMPREHENSIVE METABOLIC PANEL   Collection Time    02/12/14 12:01 PM      Result Value Ref Range   Sodium 140  135 - 145 mEq/L   Potassium 4.0  3.5 - 5.3 mEq/L   Chloride 102  96 - 112 mEq/L   CO2 27  19 - 32 mEq/L  Glucose, Bld 94  70 - 99 mg/dL   BUN 6  6 - 23 mg/dL   Creat 5.05  6.97 - 9.48 mg/dL   Total Bilirubin 0.2  0.2 - 1.2 mg/dL   Alkaline Phosphatase 75  39 - 117 U/L   AST 22  0 - 37 U/L   ALT 23  0 - 35 U/L   Total Protein 6.1  6.0 - 8.3 g/dL   Albumin 3.6  3.5 - 5.2 g/dL   Calcium 8.8  8.4 - 01.6 mg/dL  TSH   Collection Time    02/12/14 12:01 PM      Result Value Ref Range   TSH 3.067  0.350 - 4.500 uIU/mL  LIPID PANEL   Collection Time    02/12/14 12:01 PM      Result Value Ref Range   Cholesterol 180  0 - 200 mg/dL   Triglycerides 553  <748 mg/dL   HDL 53  >27 mg/dL   Total CHOL/HDL Ratio 3.4     VLDL 27  0 - 40 mg/dL   LDL Cholesterol 078 (*) 0 - 99 mg/dL  POCT WET PREP (WET MOUNT)   Collection Time    02/12/14 12:02 PM      Result Value Ref Range   Source Wet Prep POC       WBC, Wet Prep HPF POC few     Bacteria Wet Prep HPF POC       BACTERIA WET PREP MORPHOLOGY POC       Clue Cells Wet Prep HPF POC       CLUE CELLS WET PREP WHIFF POC       Yeast Wet Prep HPF POC Moderate     KOH Wet Prep POC       Trichomonas Wet Prep HPF POC      Results for orders placed in visit on 02/04/14 (from the past 8736 hour(s))  LITHIUM LEVEL   Collection Time    02/26/14 12:01 AM      Result Value Ref Range   Lithium Lvl 1.60 (*) 0.80 - 1.40 mEq/L  Results for orders placed in visit on 01/09/14 (from the past 8736 hour(s))   POCT WET PREP WITH KOH   Collection Time    01/09/14  9:49 AM      Result Value Ref Range   Trichomonas, UA Negative     Clue Cells Wet Prep HPF POC Negative     Epithelial Wet Prep HPF POC Negative     Yeast Wet Prep HPF POC Positive     Bacteria Wet Prep HPF POC Negative     RBC Wet Prep HPF POC Negative     WBC Wet Prep HPF POC Positive     KOH Prep POC Positive    Results for orders placed in visit on 12/20/13 (from the past 8736 hour(s))  COMPREHENSIVE METABOLIC PANEL   Collection Time    12/20/13  3:37 PM      Result Value Ref Range   Sodium 140  135 - 145 mEq/L   Potassium 4.1  3.5 - 5.3 mEq/L   Chloride 106  96 - 112 mEq/L   CO2 27  19 - 32 mEq/L   Glucose, Bld 106 (*) 70 - 99 mg/dL   BUN 12  6 - 23 mg/dL   Creat 6.75  4.49 - 2.01 mg/dL   Total Bilirubin 0.2  0.2 - 1.2 mg/dL   Alkaline Phosphatase 90  39 - 117  U/L   AST 20  0 - 37 U/L   ALT 27  0 - 35 U/L   Total Protein 6.4  6.0 - 8.3 g/dL   Albumin 3.7  3.5 - 5.2 g/dL   Calcium 9.0  8.4 - 10.5 mg/dL  CBC   Collection Time    12/20/13  3:37 PM      Result Value Ref Range   WBC 8.1  4.0 - 10.5 K/uL   RBC 4.27  3.87 - 5.11 MIL/uL   Hemoglobin 12.3  12.0 - 15.0 g/dL   HCT 36.8  36.0 - 46.0 %   MCV 86.2  78.0 - 100.0 fL   MCH 28.8  26.0 - 34.0 pg   MCHC 33.4  30.0 - 36.0 g/dL   RDW 14.7  11.5 - 15.5 %   Platelets 300  150 - 400 K/uL  Results for orders placed in visit on 12/14/13 (from the past 8736 hour(s))  LITHIUM LEVEL   Collection Time    01/31/14  9:44 AM      Result Value Ref Range   Lithium Lvl 0.60 (*) 0.80 - 1.40 mEq/L  Results for orders placed in visit on 12/12/13 (from the past 8736 hour(s))  LITHIUM LEVEL   Collection Time    12/12/13 11:18 AM      Result Value Ref Range   Lithium Lvl 0.70 (*) 0.80 - 1.40 mEq/L   Assessment. Axis I mood disorder NOS, bipolar disorder 1, posttraumatic stress disorder, r/o Major depressive disorder Axis II rule out borderline personality trait. Axis III  obesity, chronic headache. Axis IV moderate. Axis V 60-65.  Plan/Discussion: I review her history and previous notes.   She will continue Brintellix  at 20 mg  She'll continue  lithium carbonate  back to 900 mg each bedtime. Marland Kitchen She will continue trazodone 150 mg each bedtime and  Xanax to 1 mg 4 times a day because of increased anxiety. Because of paranoia her Seroquel be increased to 50 mg twice a day and 100 mg each bedtime. She will start up with counseling here again  Time spent 25 minutes.  More than 50% of the time spent in psychoeducation, counseling and coordination of care. She will return in 4 weeks  MEDICATIONS this encounter: Meds ordered this encounter  Medications  . Vortioxetine HBr (BRINTELLIX) 20 MG TABS    Sig: Take 20 mg by mouth daily.    Dispense:  30 tablet    Refill:  2    Take with food  . QUEtiapine (SEROQUEL) 50 MG tablet    Sig: Take one tablet twice a day and 2 at bedtime    Dispense:  120 tablet    Refill:  2  . lithium 300 MG tablet    Sig: Take three at bedtime    Dispense:  90 tablet    Refill:  2  . ALPRAZolam (XANAX) 1 MG tablet    Sig: Take 1 tablet (1 mg total) by mouth 4 (four) times daily.    Dispense:  120 tablet    Refill:  2   Medical Decision Making Problem Points:  Established problem, worsening (2), New problem, with additional work-up planned (4), Review of last therapy session (1) and Review of psycho-social stressors (1) Data Points:  Review or order clinical lab tests (1) Review of medication regiment & side effects (2) Review of new medications or change in dosage (2)  I certify that outpatient services furnished can reasonably be expected  to improve the patient's condition.   Levonne Spiller, MD

## 2014-03-15 NOTE — Telephone Encounter (Signed)
Ordered today but reordered

## 2014-03-20 ENCOUNTER — Telehealth (HOSPITAL_COMMUNITY): Payer: Self-pay | Admitting: *Deleted

## 2014-03-20 NOTE — Telephone Encounter (Signed)
submitted

## 2014-03-22 ENCOUNTER — Telehealth (HOSPITAL_COMMUNITY): Payer: Self-pay | Admitting: *Deleted

## 2014-04-04 ENCOUNTER — Encounter (HOSPITAL_COMMUNITY): Payer: Self-pay | Admitting: Psychiatry

## 2014-04-04 ENCOUNTER — Ambulatory Visit (HOSPITAL_COMMUNITY): Payer: Self-pay | Admitting: Psychiatry

## 2014-04-12 ENCOUNTER — Ambulatory Visit (INDEPENDENT_AMBULATORY_CARE_PROVIDER_SITE_OTHER): Payer: BC Managed Care – PPO | Admitting: Psychiatry

## 2014-04-12 ENCOUNTER — Encounter (HOSPITAL_COMMUNITY): Payer: Self-pay | Admitting: Psychiatry

## 2014-04-12 VITALS — BP 120/80 | Ht 60.0 in | Wt 209.0 lb

## 2014-04-12 DIAGNOSIS — F4310 Post-traumatic stress disorder, unspecified: Secondary | ICD-10-CM

## 2014-04-12 DIAGNOSIS — F39 Unspecified mood [affective] disorder: Secondary | ICD-10-CM

## 2014-04-12 DIAGNOSIS — F319 Bipolar disorder, unspecified: Secondary | ICD-10-CM

## 2014-04-12 DIAGNOSIS — F3162 Bipolar disorder, current episode mixed, moderate: Secondary | ICD-10-CM

## 2014-04-12 MED ORDER — ALPRAZOLAM 1 MG PO TABS: ORAL_TABLET | ORAL | Status: DC

## 2014-04-12 NOTE — Progress Notes (Signed)
Patient ID: EDNAH HAMMOCK, female   DOB: [birth year], 33 y.o.   MRN: [id number] Patient ID: DANIJAH NOH, female   DOB: 08-27-1981, 33 y.o.   MRN: [id number] Patient ID: ACCALIA RIGDON, female   DOB: 1981/05/04, 33 y.o.   MRN: [id number] Patient ID: JOCI DRESS, female   DOB: [birth year], 33 y.o.   MRN: [id number] Patient ID: CHELCIE ESTORGA, female   DOB: July 12, 1981, 33 y.o.   MRN: [id number] Patient ID: SCARLETT PORTLOCK, female   DOB: 05/02/1981, 33 y.o.   MRN: [id number] Patient ID: LADEJA PELHAM, female   DOB: 04/29/1981, 33 y.o.   MRN: [id number] Patient ID: MALITA IGNASIAK, female   DOB: Mar 17, 1981, 33 y.o.   MRN: [id number] Patient ID: TRINIDEE SCHRAG, female   DOB: Mar 14, 1981, 33 y.o.   MRN: [id number] [place] [address] Progress Note KORA GROOM MRN: [id number] DOB: Apr 10, 1981 Age: 33 y.o.  Date: 04/12/2014  Chief Complaint: Chief Complaint  Patient presents with  . Anxiety  . Depression  . Manic Behavior  . Follow-up   Subjective: I'm a little better but my thoughts still race."  This patient is a 33 year old married white female who lives with her husband and 3 children-2 daughters ages 73 and 68 and a boy age 49 in [place] she is unemployed and is applying for disability.  The patient returns after four-week's. She claims she is somewhat better. She is still under a lot of stress because her mom has been very ill with renal failure and recent retinal hemorrhages. Her teenage stepdaughter has been causing a lot of difficulty in the family and the daughter his mother has been undermining Odeth. Her husband is very shy and doesn't really stand up to her Mariza feels as if she's the only one holding the family together. She denies any thoughts of self-harm or suicide but does state that her thoughts race a good deal at night and would like to go up by 1 tablet on the Xanax. I told her this would be the highest dose that we can go to  .Marland Kitchen Past psychiatric history. Patient has history of significant depression and mood  swings. She has at least 4 psychiatric admissions in past.  In 2000-2003 she was admitted due to cutting herself and suicidal thinking. In the past. She had tried Geodon, Depakote, Lamictal, Prozac, Abilify, Lexapro, Celexa, Effexor, Wellbutrin, BuSpar, Paxil, Risperdal, Ativan and Klonopin. She admitted history of anger, severe mood swings, and irritability.  Her last psychiatric admission was in June 2013.  Psychosocial history. Patient was born and lived in [place]. She's been married twice. She has one son from her previous marriage and a daughter from her second marriage. She used to live by herself however recently her husband move-in and she is working on her marriage.   Allergies: Allergies  Allergen Reactions  . Risperdal [Risperidone] Other (See Comments)    Wt gain  . Macrobid [Nitrofurantoin Monohyd Macro] Itching    Medical History: Past Medical History  Diagnosis Date  . Headache(784.0)   . Bipolar disorder   . Depression   . Ulcer   . Vaginal irritation 02/12/2014  . Yeast infection 02/12/2014  . Obesity   Patient has obesity, and chronic migraine headache. . She is taking Imitrex as needed for headache.  Her blood work in March 2013 showed hemoglobin A1c is 6 however her liver enzymes and kidney functions are normal.  Her primary care physician is [facility] internal medicine.  Surgical History: Past Surgical History  Procedure Laterality Date  . Tubal ligation    . Cholecystectomy  Feb 2015    Dr. Ladona Horns, [place]  . Esophagogastroduodenoscopy (egd) with esophageal dilation N/A 02/11/2014    Procedure: ESOPHAGOGASTRODUODENOSCOPY (EGD) WITH ESOPHAGEAL DILATION;  Surgeon: Danie Binder, MD;  Location[facility];  Service: Endoscopy;  Laterality: N/A;  11:00-rescheduled to 11:45 Darius Bump to notify pt   Family history. Patient endorsed significant history of depression anxiety in her family. Her brother, sister and father has depression and anxiety. family  history includes Anxiety disorder in her brother, father, and sister; Cancer - Other in her paternal grandfather; Depression in her brother, father, and sister. There is no history of ADD / ADHD, Alcohol abuse, Drug abuse, Bipolar disorder, Dementia, OCD, Paranoid behavior, Schizophrenia, Seizures, Sexual abuse, Physical abuse, or Colon cancer. Reviewed all this again today in the session and nothing has changed.  Education background and work history. She has bachelor degree in Programmer, applications.  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  She worked briefly at [employer] but quit her job due to feeling overwhelmed.  She recently worked at [employer] as a Scientist, water quality for 2 weeks only.    Alcohol and substance use history. Patient denies any history of alcohol or substance use. She denies a history of DWI.  She has used Xanax for two years and is not willing to stop this for Neurontin for her anxiety despite noting memory loss with the use of Xanax.  Filed Vitals:   04/12/14 1020  BP: 120/80    Mental status examination. Patient is a obese, female, who is casually dressed.  She is anxious but cooperative.  She described her mood as up and down and her affect is somewhat flat She maintained fair eye contact.  She denies any active or passive suicidal thoughts or homicidal thoughts.  She denies any auditory hallucination or visual hallucination.  She endorse some paranoia about people talking about her behind her back. She i seems to be on  edge but she is more talkative than she has been in the past There were no flight of ideas or loose association .  Her attention and concentration is fair.  She's alert and oriented x3. Her insight judgment and impulse control is fair.  Lab Results:  Results for orders placed in visit on 03/01/14 (from the past 8736 hour(s))  LITHIUM LEVEL   Collection Time    03/14/14  9:39 AM      Result Value Ref  Range   Lithium Lvl 0.70 (*) 0.80 - 1.40 mEq/L  Results for orders placed in visit on 02/12/14 (from the past 8736 hour(s))  CBC   Collection Time    02/12/14 12:01 PM      Result Value Ref Range   WBC 6.7  4.0 - 10.5 K/uL   RBC 4.15  3.87 - 5.11 MIL/uL   Hemoglobin 11.4 (*) 12.0 - 15.0 g/dL   HCT 34.1 (*) 36.0 - 46.0 %   MCV 82.2  78.0 - 100.0 fL   MCH 27.5  26.0 - 34.0 pg   MCHC 33.4  30.0 - 36.0 g/dL   RDW 15.1  11.5 - 15.5 %   Platelets 283  150 - 400 K/uL  COMPREHENSIVE METABOLIC PANEL   Collection Time    02/12/14 12:01 PM      Result Value Ref Range  Sodium 140  135 - 145 mEq/L   Potassium 4.0  3.5 - 5.3 mEq/L   Chloride 102  96 - 112 mEq/L   CO2 27  19 - 32 mEq/L   Glucose, Bld 94  70 - 99 mg/dL   BUN 6  6 - 23 mg/dL   Creat 0.54  0.50 - 1.10 mg/dL   Total Bilirubin 0.2  0.2 - 1.2 mg/dL   Alkaline Phosphatase 75  39 - 117 U/L   AST 22  0 - 37 U/L   ALT 23  0 - 35 U/L   Total Protein 6.1  6.0 - 8.3 g/dL   Albumin 3.6  3.5 - 5.2 g/dL   Calcium 8.8  8.4 - 10.5 mg/dL  TSH   Collection Time    02/12/14 12:01 PM      Result Value Ref Range   TSH 3.067  0.350 - 4.500 uIU/mL  LIPID PANEL   Collection Time    02/12/14 12:01 PM      Result Value Ref Range   Cholesterol 180  0 - 200 mg/dL   Triglycerides 133  <150 mg/dL   HDL 53  >39 mg/dL   Total CHOL/HDL Ratio 3.4     VLDL 27  0 - 40 mg/dL   LDL Cholesterol 100 (*) 0 - 99 mg/dL  POCT WET PREP (WET MOUNT)   Collection Time    02/12/14 12:02 PM      Result Value Ref Range   Source Wet Prep POC       WBC, Wet Prep HPF POC few     Bacteria Wet Prep HPF POC       BACTERIA WET PREP MORPHOLOGY POC       Clue Cells Wet Prep HPF POC       CLUE CELLS WET PREP WHIFF POC       Yeast Wet Prep HPF POC Moderate     KOH Wet Prep POC       Trichomonas Wet Prep HPF POC      Results for orders placed in visit on 02/04/14 (from the past 8736 hour(s))  LITHIUM LEVEL   Collection Time    02/26/14 12:01 AM      Result Value  Ref Range   Lithium Lvl 1.60 (*) 0.80 - 1.40 mEq/L  Results for orders placed in visit on 01/09/14 (from the past 8736 hour(s))  POCT WET PREP WITH KOH   Collection Time    01/09/14  9:49 AM      Result Value Ref Range   Trichomonas, UA Negative     Clue Cells Wet Prep HPF POC Negative     Epithelial Wet Prep HPF POC Negative     Yeast Wet Prep HPF POC Positive     Bacteria Wet Prep HPF POC Negative     RBC Wet Prep HPF POC Negative     WBC Wet Prep HPF POC Positive     KOH Prep POC Positive    Results for orders placed in visit on 12/20/13 (from the past 8736 hour(s))  COMPREHENSIVE METABOLIC PANEL   Collection Time    12/20/13  3:37 PM      Result Value Ref Range   Sodium 140  135 - 145 mEq/L   Potassium 4.1  3.5 - 5.3 mEq/L   Chloride 106  96 - 112 mEq/L   CO2 27  19 - 32 mEq/L   Glucose, Bld 106 (*) 70 - 99 mg/dL  BUN 12  6 - 23 mg/dL   Creat 0.67  0.50 - 1.10 mg/dL   Total Bilirubin 0.2  0.2 - 1.2 mg/dL   Alkaline Phosphatase 90  39 - 117 U/L   AST 20  0 - 37 U/L   ALT 27  0 - 35 U/L   Total Protein 6.4  6.0 - 8.3 g/dL   Albumin 3.7  3.5 - 5.2 g/dL   Calcium 9.0  8.4 - 10.5 mg/dL  CBC   Collection Time    12/20/13  3:37 PM      Result Value Ref Range   WBC 8.1  4.0 - 10.5 K/uL   RBC 4.27  3.87 - 5.11 MIL/uL   Hemoglobin 12.3  12.0 - 15.0 g/dL   HCT 36.8  36.0 - 46.0 %   MCV 86.2  78.0 - 100.0 fL   MCH 28.8  26.0 - 34.0 pg   MCHC 33.4  30.0 - 36.0 g/dL   RDW 14.7  11.5 - 15.5 %   Platelets 300  150 - 400 K/uL  Results for orders placed in visit on 12/14/13 (from the past 8736 hour(s))  LITHIUM LEVEL   Collection Time    01/31/14  9:44 AM      Result Value Ref Range   Lithium Lvl 0.60 (*) 0.80 - 1.40 mEq/L  Results for orders placed in visit on 12/12/13 (from the past 8736 hour(s))  LITHIUM LEVEL   Collection Time    12/12/13 11:18 AM      Result Value Ref Range   Lithium Lvl 0.70 (*) 0.80 - 1.40 mEq/L   Assessment. Axis I mood disorder NOS, bipolar  disorder 1, posttraumatic stress disorder, r/o Major depressive disorder Axis II rule out borderline personality trait. Axis III obesity, chronic headache. Axis IV moderate. Axis V 60-65.  Plan/Discussion: I review her history and previous notes.   She will continue Brintellix  at 20 mg  She'll continue  lithium carbonate  back to 900 mg each bedtime. Marland Kitchen She will continue trazodone 150 mg each bedtime and increase  Xanax to 1 mg 3 times a day and 2 at bedtime  Seroquel be continued at 50 mg twice a day and 100 mg each bedtime. She will continue counseling here Time spent 25 minutes.  More than 50% of the time spent in psychoeducation, counseling and coordination of care. She will return in 4 weeks  MEDICATIONS this encounter: Meds ordered this encounter  Medications  . ALPRAZolam (XANAX) 1 MG tablet    Sig: Take one three times a day and two at bedtime    Dispense:  150 tablet    Refill:  2   Medical Decision Making Problem Points:  Established problem, worsening (2), New problem, with additional work-up planned (4), Review of last therapy session (1) and Review of psycho-social stressors (1) Data Points:  Review or order clinical lab tests (1) Review of medication regiment & side effects (2) Review of new medications or change in dosage (2)  I certify that outpatient services furnished can reasonably be expected to improve the patient's condition.   Levonne Spiller, MD

## 2014-04-26 ENCOUNTER — Ambulatory Visit (INDEPENDENT_AMBULATORY_CARE_PROVIDER_SITE_OTHER): Payer: BC Managed Care – PPO | Admitting: Psychiatry

## 2014-04-26 DIAGNOSIS — F3162 Bipolar disorder, current episode mixed, moderate: Secondary | ICD-10-CM

## 2014-04-26 NOTE — Patient Instructions (Signed)
Discussed orally 
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
## 2014-05-01 ENCOUNTER — Encounter: Payer: Self-pay | Admitting: Gastroenterology

## 2014-05-08 ENCOUNTER — Ambulatory Visit (INDEPENDENT_AMBULATORY_CARE_PROVIDER_SITE_OTHER): Payer: BC Managed Care – PPO | Admitting: Psychiatry

## 2014-05-08 ENCOUNTER — Encounter (HOSPITAL_COMMUNITY): Payer: Self-pay | Admitting: Psychiatry

## 2014-05-08 VITALS — BP 120/84 | Ht 60.0 in | Wt 199.0 lb

## 2014-05-08 DIAGNOSIS — F4310 Post-traumatic stress disorder, unspecified: Secondary | ICD-10-CM

## 2014-05-08 DIAGNOSIS — F319 Bipolar disorder, unspecified: Secondary | ICD-10-CM

## 2014-05-08 DIAGNOSIS — F39 Unspecified mood [affective] disorder: Secondary | ICD-10-CM

## 2014-05-08 DIAGNOSIS — F3162 Bipolar disorder, current episode mixed, moderate: Secondary | ICD-10-CM

## 2014-05-08 MED ORDER — LITHIUM CARBONATE 300 MG PO TABS: ORAL_TABLET | ORAL | Status: DC

## 2014-05-08 MED ORDER — VORTIOXETINE HBR 20 MG PO TABS: 20.0000 mg | ORAL_TABLET | Freq: Every day | ORAL | Status: DC

## 2014-05-08 MED ORDER — QUETIAPINE FUMARATE 50 MG PO TABS: ORAL_TABLET | ORAL | Status: DC

## 2014-05-08 NOTE — Progress Notes (Signed)
Patient ID: Teresa Tran, female   DOB: 25-Aug-1981, 33 y.o.   MRN: [id number] Patient ID: Teresa Tran, female   DOB: 10-May-1981, 33 y.o.   MRN: [id number] Patient ID: Teresa Tran, female   DOB: Jul 20, 1981, 33 y.o.   MRN: [id number] Patient ID: Teresa Tran, female   DOB: [birth year], 33 y.o.   MRN: [id number] Patient ID: Teresa Tran, female   DOB: January 28, 1981, 33 y.o.   MRN: [id number] Patient ID: Teresa Tran, female   DOB: Dec 28, 1980, 33 y.o.   MRN: [id number] Patient ID: Teresa Tran, female   DOB: [birth year], 33 y.o.   MRN: [id number] Patient ID: Teresa Tran, female   DOB: 27-Oct-1981, 33 y.o.   MRN: [id number] Patient ID: Teresa Tran, female   DOB: 04-30-1981, 33 y.o.   MRN: [id number] Patient ID: Teresa Tran, female   DOB: 1980-11-30, 33 y.o.   MRN: [id number] [place] [address] Progress Note Teresa Tran MRN: [id number] DOB: [birth year] Age: 33 y.o.  Date: 05/08/2014  Chief Complaint: Chief Complaint  Patient presents with  . Depression  . Manic Behavior  . Follow-up   Subjective: I'm a little better but my thoughts still race."  This patient is a 33 year old married white female who lives with her husband and 3 children-2 daughters ages 55 and 71 and a boy age 58 in [place] she is unemployed and is applying for disability.  The patient returns after four-week's. She claims she is better but claims she is "hypomanic" she states that her thoughts race but on the other hand she is getting more done and it seems to be goal-directed. She's exercising more and eating healthier and she has lost 10 pounds and she feels good about this. She's not engaging in any self-harm and is sleeping about 5-6 hours per night. She denies being depressed or suicidal. She's not done anything impulsive such as promiscuous sexual activity or overspending. Her last lithium level was 0.7 so there still a small amount of room to go up. She is going to Dr. Wende Neighbors tomorrow as a new patient for primary care. He is going to check  some other lab work so I suggested we add in a lithium level.  .. Past psychiatric history. Patient has history of significant depression and mood swings. She has at least 4 psychiatric admissions in past.  In 2000-2003 she was admitted due to cutting herself and suicidal thinking. In the past. She had tried Geodon, Depakote, Lamictal, Prozac, Abilify, Lexapro, Celexa, Effexor, Wellbutrin, BuSpar, Paxil, Risperdal, Ativan and Klonopin. She admitted history of anger, severe mood swings, and irritability.  Her last psychiatric admission was in June 2013.  Psychosocial history. Patient was born and lived in [place]. She's been married twice. She has one son from her previous marriage and a daughter from her second marriage. She used to live by herself however recently her husband move-in and she is working on her marriage.   Allergies: Allergies  Allergen Reactions  . Risperdal [Risperidone] Other (See Comments)    Wt gain  . Macrobid [Nitrofurantoin Monohyd Macro] Itching    Medical History: Past Medical History  Diagnosis Date  . Headache(784.0)   . Bipolar disorder   . Depression   . Ulcer   . Vaginal irritation 02/12/2014  . Yeast infection 02/12/2014  . Obesity   Patient has obesity, and chronic migraine headache. . She is taking Imitrex as needed for headache.  Her blood work in  March 2013 showed hemoglobin A1c is 6 however her liver enzymes and kidney functions are normal.  Her primary care physician is [facility] internal medicine.    Surgical History: Past Surgical History  Procedure Laterality Date  . Tubal ligation    . Cholecystectomy  Feb 2015    Dr. Ladona Horns, [place]  . Esophagogastroduodenoscopy (egd) with esophageal dilation N/A 02/11/2014    Procedure: ESOPHAGOGASTRODUODENOSCOPY (EGD) WITH ESOPHAGEAL DILATION;  Surgeon: Danie Binder, MD;  Location: [facility];  Service: Endoscopy;  Laterality: N/A;  11:00-rescheduled to 11:45 Darius Bump to notify pt   Family  history. Patient endorsed significant history of depression anxiety in her family. Her brother, sister and father has depression and anxiety. family history includes Anxiety disorder in her brother, father, and sister; Cancer - Other in her paternal grandfather; Depression in her brother, father, and sister. There is no history of ADD / ADHD, Alcohol abuse, Drug abuse, Bipolar disorder, Dementia, OCD, Paranoid behavior, Schizophrenia, Seizures, Sexual abuse, Physical abuse, or Colon cancer. Reviewed all this again today in the session and nothing has changed.  Education background and work history. She has bachelor degree in Programmer, applications.  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 She quit her job in December 2012 .  She worked briefly at [employer] but quit her job due to feeling overwhelmed.  She recently worked at [employer] as a Scientist, water quality for 2 weeks only.    Alcohol and substance use history. Patient denies any history of alcohol or substance use. She denies a history of DWI.  She has used Xanax for two years and is not willing to stop this for Neurontin for her anxiety despite noting memory loss with the use of Xanax.  Filed Vitals:   05/08/14 1004  BP: 120/84    Mental status examination. Patient is a obese, female, who is casually dressed.  She is anxious but cooperative.  She described her mood as up and down and her affect is somewhat flat She maintained fair eye contact.  She denies any active or passive suicidal thoughts or homicidal thoughts.  She denies any auditory hallucination or visual hallucination.  She denies paranoid ideation. She she claims that her thoughts are racing but her speech is clear coherent and logical There were no flight of ideas or loose association .  Her attention and concentration is fair.  She's alert and oriented x3. Her insight judgment and impulse control is fair.  Lab Results:  Results for orders placed in visit on  03/01/14 (from the past 8736 hour(s))  LITHIUM LEVEL   Collection Time    03/14/14  9:39 AM      Result Value Ref Range   Lithium Lvl 0.70 (*) 0.80 - 1.40 mEq/L  Results for orders placed in visit on 02/12/14 (from the past 8736 hour(s))  CBC   Collection Time    02/12/14 12:01 PM      Result Value Ref Range   WBC 6.7  4.0 - 10.5 K/uL   RBC 4.15  3.87 - 5.11 MIL/uL   Hemoglobin 11.4 (*) 12.0 - 15.0 g/dL   HCT 34.1 (*) 36.0 - 46.0 %   MCV 82.2  78.0 - 100.0 fL   MCH 27.5  26.0 - 34.0 pg   MCHC 33.4  30.0 - 36.0 g/dL   RDW 15.1  11.5 - 15.5 %   Platelets 283  150 - 400 K/uL  COMPREHENSIVE METABOLIC PANEL   Collection Time  02/12/14 12:01 PM      Result Value Ref Range   Sodium 140  135 - 145 mEq/L   Potassium 4.0  3.5 - 5.3 mEq/L   Chloride 102  96 - 112 mEq/L   CO2 27  19 - 32 mEq/L   Glucose, Bld 94  70 - 99 mg/dL   BUN 6  6 - 23 mg/dL   Creat 0.54  0.50 - 1.10 mg/dL   Total Bilirubin 0.2  0.2 - 1.2 mg/dL   Alkaline Phosphatase 75  39 - 117 U/L   AST 22  0 - 37 U/L   ALT 23  0 - 35 U/L   Total Protein 6.1  6.0 - 8.3 g/dL   Albumin 3.6  3.5 - 5.2 g/dL   Calcium 8.8  8.4 - 10.5 mg/dL  TSH   Collection Time    02/12/14 12:01 PM      Result Value Ref Range   TSH 3.067  0.350 - 4.500 uIU/mL  LIPID PANEL   Collection Time    02/12/14 12:01 PM      Result Value Ref Range   Cholesterol 180  0 - 200 mg/dL   Triglycerides 133  <150 mg/dL   HDL 53  >39 mg/dL   Total CHOL/HDL Ratio 3.4     VLDL 27  0 - 40 mg/dL   LDL Cholesterol 100 (*) 0 - 99 mg/dL  POCT WET PREP (WET MOUNT)   Collection Time    02/12/14 12:02 PM      Result Value Ref Range   Source Wet Prep POC       WBC, Wet Prep HPF POC few     Bacteria Wet Prep HPF POC       BACTERIA WET PREP MORPHOLOGY POC       Clue Cells Wet Prep HPF POC       CLUE CELLS WET PREP WHIFF POC       Yeast Wet Prep HPF POC Moderate     KOH Wet Prep POC       Trichomonas Wet Prep HPF POC      Results for orders placed in  visit on 02/04/14 (from the past 8736 hour(s))  LITHIUM LEVEL   Collection Time    02/26/14 12:01 AM      Result Value Ref Range   Lithium Lvl 1.60 (*) 0.80 - 1.40 mEq/L  Results for orders placed in visit on 01/09/14 (from the past 8736 hour(s))  POCT WET PREP WITH KOH   Collection Time    01/09/14  9:49 AM      Result Value Ref Range   Trichomonas, UA Negative     Clue Cells Wet Prep HPF POC Negative     Epithelial Wet Prep HPF POC Negative     Yeast Wet Prep HPF POC Positive     Bacteria Wet Prep HPF POC Negative     RBC Wet Prep HPF POC Negative     WBC Wet Prep HPF POC Positive     KOH Prep POC Positive    Results for orders placed in visit on 12/20/13 (from the past 8736 hour(s))  COMPREHENSIVE METABOLIC PANEL   Collection Time    12/20/13  3:37 PM      Result Value Ref Range   Sodium 140  135 - 145 mEq/L   Potassium 4.1  3.5 - 5.3 mEq/L   Chloride 106  96 - 112 mEq/L   CO2 27  19 -  32 mEq/L   Glucose, Bld 106 (*) 70 - 99 mg/dL   BUN 12  6 - 23 mg/dL   Creat 0.67  0.50 - 1.10 mg/dL   Total Bilirubin 0.2  0.2 - 1.2 mg/dL   Alkaline Phosphatase 90  39 - 117 U/L   AST 20  0 - 37 U/L   ALT 27  0 - 35 U/L   Total Protein 6.4  6.0 - 8.3 g/dL   Albumin 3.7  3.5 - 5.2 g/dL   Calcium 9.0  8.4 - 10.5 mg/dL  CBC   Collection Time    12/20/13  3:37 PM      Result Value Ref Range   WBC 8.1  4.0 - 10.5 K/uL   RBC 4.27  3.87 - 5.11 MIL/uL   Hemoglobin 12.3  12.0 - 15.0 g/dL   HCT 36.8  36.0 - 46.0 %   MCV 86.2  78.0 - 100.0 fL   MCH 28.8  26.0 - 34.0 pg   MCHC 33.4  30.0 - 36.0 g/dL   RDW 14.7  11.5 - 15.5 %   Platelets 300  150 - 400 K/uL  Results for orders placed in visit on 12/14/13 (from the past 8736 hour(s))  LITHIUM LEVEL   Collection Time    01/31/14  9:44 AM      Result Value Ref Range   Lithium Lvl 0.60 (*) 0.80 - 1.40 mEq/L  Results for orders placed in visit on 12/12/13 (from the past 8736 hour(s))  LITHIUM LEVEL   Collection Time    12/12/13 11:18 AM       Result Value Ref Range   Lithium Lvl 0.70 (*) 0.80 - 1.40 mEq/L   Assessment. Axis I mood disorder NOS, bipolar disorder 1, posttraumatic stress disorder, r/o Major depressive disorder Axis II rule out borderline personality trait. Axis III obesity, chronic headache. Axis IV moderate. Axis V 60-65.  Plan/Discussion: I review her history and previous notes.   She will continue Brintellix  at 20 mg  She'll continue  lithium carbonate  back to 900 mg each bedtime. Marland Kitchen She will continue trazodone 150 mg each bedtime and  Xanax to 1 mg 3 times a day and 2 at bedtime  Seroquel be continued at 50 mg twice a day and 100 mg each bedtime. She will continue counseling here Time spent 25 minutes.  More than 50% of the time spent in psychoeducation, counseling and coordination of care. She will return in 6 weeks  MEDICATIONS this encounter: Meds ordered this encounter  Medications  . lithium 300 MG tablet    Sig: Take three at bedtime    Dispense:  90 tablet    Refill:  2  . QUEtiapine (SEROQUEL) 50 MG tablet    Sig: Take one tablet twice a day and 2 at bedtime    Dispense:  120 tablet    Refill:  2  . Vortioxetine HBr (BRINTELLIX) 20 MG TABS    Sig: Take 20 mg by mouth daily.    Dispense:  30 tablet    Refill:  2    Take with food   Medical Decision Making Problem Points:  Established problem, worsening (2), New problem, with additional work-up planned (4), Review of last therapy session (1) and Review of psycho-social stressors (1) Data Points:  Review or order clinical lab tests (1) Review of medication regiment & side effects (2) Review of new medications or change in dosage (2)  I certify that outpatient  services furnished can reasonably be expected to improve the patient's condition.   Levonne Spiller, MD

## 2014-05-14 ENCOUNTER — Ambulatory Visit (HOSPITAL_COMMUNITY): Payer: Self-pay | Admitting: Psychiatry

## 2014-05-21 ENCOUNTER — Other Ambulatory Visit: Payer: Self-pay | Admitting: Gastroenterology

## 2014-05-21 ENCOUNTER — Ambulatory Visit (HOSPITAL_COMMUNITY)
Admission: RE | Admit: 2014-05-21 | Discharge: 2014-05-21 | Disposition: A | Discharge status: Home / Self Care | Payer: BC Managed Care – PPO | Source: Ambulatory Visit | Attending: Gastroenterology | Admitting: Gastroenterology

## 2014-05-21 DIAGNOSIS — R1011 Right upper quadrant pain: Secondary | ICD-10-CM

## 2014-05-21 DIAGNOSIS — Z9089 Acquired absence of other organs: Secondary | ICD-10-CM | POA: Insufficient documentation

## 2014-05-21 DIAGNOSIS — K7689 Other specified diseases of liver: Secondary | ICD-10-CM | POA: Insufficient documentation

## 2014-05-27 ENCOUNTER — Telehealth: Payer: Self-pay | Admitting: Gastroenterology

## 2014-05-27 NOTE — Progress Notes (Signed)
Quick Note:  Likely small liver hemangioma on [name]. No new liver lesions. Could recheck in 6 months ______

## 2014-05-27 NOTE — Telephone Encounter (Signed)
Pt aware of results 

## 2014-05-27 NOTE — Telephone Encounter (Signed)
I called pt and told her I will call her with results when Laban Emperor, NP signs off on it.

## 2014-05-27 NOTE — Telephone Encounter (Signed)
Please see note.

## 2014-05-27 NOTE — Telephone Encounter (Signed)
Pt was calling to see if her U/S results were back yet. Please call [phone]

## 2014-05-27 NOTE — Progress Notes (Signed)
Quick Note:  Called and informed pt. Routing to Manuela Schwartz to nic the [name] in 6 months. ______

## 2014-05-31 ENCOUNTER — Ambulatory Visit (INDEPENDENT_AMBULATORY_CARE_PROVIDER_SITE_OTHER): Payer: BC Managed Care – PPO | Admitting: Psychiatry

## 2014-05-31 DIAGNOSIS — F3162 Bipolar disorder, current episode mixed, moderate: Secondary | ICD-10-CM

## 2014-05-31 NOTE — Patient Instructions (Signed)
Discussed orally 
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
## 2014-06-06 ENCOUNTER — Ambulatory Visit (INDEPENDENT_AMBULATORY_CARE_PROVIDER_SITE_OTHER): Payer: BC Managed Care – PPO | Admitting: Gastroenterology

## 2014-06-06 ENCOUNTER — Encounter: Payer: Self-pay | Admitting: Gastroenterology

## 2014-06-06 ENCOUNTER — Telehealth (HOSPITAL_COMMUNITY): Payer: Self-pay | Admitting: *Deleted

## 2014-06-06 ENCOUNTER — Other Ambulatory Visit: Payer: Self-pay | Admitting: Gastroenterology

## 2014-06-06 VITALS — BP 103/71 | HR 84 | Temp 98.2°F | Ht 60.0 in | Wt 193.0 lb

## 2014-06-06 DIAGNOSIS — K279 Peptic ulcer, site unspecified, unspecified as acute or chronic, without hemorrhage or perforation: Secondary | ICD-10-CM

## 2014-06-06 DIAGNOSIS — R7303 Prediabetes: Secondary | ICD-10-CM

## 2014-06-06 MED ORDER — OMEPRAZOLE 20 MG PO CPDR: 20.0000 mg | DELAYED_RELEASE_CAPSULE | Freq: Every day | ORAL | Status: DC

## 2014-06-06 NOTE — Patient Instructions (Signed)
Continue to take Prilosec each morning, 30 minutes before breakfast.   We have set you up for a repeat upper endoscopy with Dr. Oneida Alar.   We have referred you to a nutritionist.

## 2014-06-06 NOTE — Progress Notes (Signed)
Primary Care Physician:  Delphina Cahill, MD Primary GI: Dr. Oneida Alar   Chief Complaint  Patient presents with  . Follow-up    HPI:   Pleasant 33 year old female presents today in follow-up with history of PUD secondary to NSAIDs. EGD on file from April 2015. Needs surveillance EGD now. Also history of loose stools after cholecystectomy. Started on Bentyl.  No further N/V, dysphagia. No abdominal pain. Diarrhea improved from last visit. Not taking Bentyl. Not on Prilosec. No rectal bleeding. Lost at least 20 lbs since last visit purposefully.    Past Medical History  Diagnosis Date  . Headache(784.0)   . Bipolar disorder   . Depression   . Ulcer   . Vaginal irritation 02/12/2014  . Yeast infection 02/12/2014  . Obesity     Past Surgical History  Procedure Laterality Date  . Tubal ligation    . Cholecystectomy  Feb 2015    Dr. Ladona Horns, [place]  . Esophagogastroduodenoscopy (egd) with esophageal dilation N/A 02/11/2014    Dr. 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 ALPRAZolam (XANAX) 1 MG tablet Take one three times a day and two at bedtime  150 tablet  2  . lithium 300 MG tablet Take three at bedtime  90 tablet  2  . QUEtiapine (SEROQUEL) 50 MG tablet Take one tablet twice a day and 2 at bedtime  120 tablet  2  . Vortioxetine HBr (BRINTELLIX) 20 MG TABS Take 20 mg by mouth daily.  30 tablet  2  . dicyclomine (BENTYL) 10 MG capsule Take 1 capsule (10 mg total) by mouth 4 (four) times daily -  before meals and at bedtime.  120 capsule  3  . fluconazole (DIFLUCAN) 150 MG tablet Take 1 now and 1 in 3 days if needed  2 tablet  1  . omeprazole (PRILOSEC) 20 MG capsule Take 1 capsule (20 mg total) by mouth 2 (two) times daily before a meal. TAKE 30 MINS PRIOR TO MEALS.  30 capsule  3  . ondansetron (ZOFRAN) 4 MG tablet Take 1 tablet (4 mg total) by mouth every 8 (eight) hours as  needed for nausea or vomiting.  30 tablet  1   No current facility-administered medications for this visit.    Allergies as of 06/06/2014 - Review Complete 06/06/2014  Allergen Reaction Noted  . Risperdal [risperidone] Other (See Comments) 10/11/2012  . Macrobid [nitrofurantoin monohyd macro] Itching 01/09/2014    Family History  Problem Relation Age of Onset  . Depression Father   . Anxiety disorder Father   . Depression Sister   . Anxiety disorder Sister   . Depression Brother   . Anxiety disorder Brother   . ADD / ADHD Neg Hx   . Alcohol abuse Neg Hx   . Drug abuse Neg Hx   . Bipolar disorder Neg Hx   . Dementia Neg Hx   . OCD Neg Hx   . Paranoid behavior Neg Hx   . Schizophrenia Neg Hx   . Seizures Neg Hx   . Sexual abuse Neg Hx   . Physical abuse Neg Hx   . Colon cancer Neg Hx   . Cancer - Other Paternal Grandfather     History   Social History  . Marital Status: Married    Spouse Name: N/A    Number of Children: N/A  . Years of Education: N/A   Social  History Main Topics  . Smoking status: Never Smoker   . Smokeless tobacco: Never Used     Comment: NEVER SMOKED  . Alcohol Use: No  . Drug Use: No  . Sexual Activity: Yes    Birth Control/ Protection: Surgical     Comment: tubal   Other Topics Concern  . None   Social History Narrative  . None    Review of Systems: As mentioned in HPI.   Physical Exam: BP 103/71  Pulse 84  Temp(Src) 98.2 F (36.8 C) (Oral)  Ht 5' (1.524 m)  Wt 193 lb (87.544 kg)  BMI 37.69 kg/m2  LMP 05/30/2014 General:   Alert and oriented. No distress noted. Pleasant and cooperative.  Head:  Normocephalic and atraumatic. Eyes:  Conjuctiva clear without scleral icterus. Mouth:  Oral mucosa pink and moist. Good dentition. No lesions. Heart:  S1, S2 present without murmurs, rubs, or gallops. Regular rate and rhythm. Abdomen:  +BS, soft, non-tender and non-distended. No rebound or guarding. No HSM or masses noted. Msk:   Symmetrical without gross deformities. Normal posture. Pulses:  2+ DP noted bilaterally Neurologic:  Alert and  oriented x4;  grossly normal neurologically. Skin:  Intact without significant lesions or rashes. Psych:  Alert and cooperative. Normal mood and affect.

## 2014-06-07 ENCOUNTER — Encounter (HOSPITAL_COMMUNITY): Payer: Self-pay | Admitting: Pharmacy Technician

## 2014-06-10 NOTE — Telephone Encounter (Signed)
She will need to bring in jury duty request

## 2014-06-10 NOTE — Assessment & Plan Note (Signed)
33 year old female with PUD secondary to NSAIDs, due for surveillance EGD now. Asymptomatic. She has not been taking a PPI daily. Instructed to resume PPI daily, continue avoidance of NSAIDs.   Proceed with upper endoscopy in the near future with Dr. Oneida Alar. The risks, benefits, and alternatives have been discussed in detail with patient. They have stated understanding and desire to proceed.

## 2014-06-11 ENCOUNTER — Encounter (HOSPITAL_COMMUNITY): Payer: Self-pay | Admitting: Psychiatry

## 2014-06-11 ENCOUNTER — Encounter (HOSPITAL_COMMUNITY)
Admission: RE | Disposition: A | Discharge status: Home / Self Care | Payer: Self-pay | Source: Ambulatory Visit | Attending: Gastroenterology

## 2014-06-11 ENCOUNTER — Encounter (HOSPITAL_COMMUNITY): Payer: Self-pay | Admitting: *Deleted

## 2014-06-11 ENCOUNTER — Ambulatory Visit (HOSPITAL_COMMUNITY)
Admission: RE | Admit: 2014-06-11 | Discharge: 2014-06-11 | Disposition: A | Discharge status: Home / Self Care | Payer: BC Managed Care – PPO | Source: Ambulatory Visit | Attending: Gastroenterology | Admitting: Gastroenterology

## 2014-06-11 DIAGNOSIS — E669 Obesity, unspecified: Secondary | ICD-10-CM | POA: Insufficient documentation

## 2014-06-11 DIAGNOSIS — Z79899 Other long term (current) drug therapy: Secondary | ICD-10-CM | POA: Insufficient documentation

## 2014-06-11 DIAGNOSIS — F3289 Other specified depressive episodes: Secondary | ICD-10-CM | POA: Insufficient documentation

## 2014-06-11 DIAGNOSIS — F329 Major depressive disorder, single episode, unspecified: Secondary | ICD-10-CM | POA: Insufficient documentation

## 2014-06-11 DIAGNOSIS — K2980 Duodenitis without bleeding: Secondary | ICD-10-CM | POA: Insufficient documentation

## 2014-06-11 DIAGNOSIS — A048 Other specified bacterial intestinal infections: Secondary | ICD-10-CM | POA: Insufficient documentation

## 2014-06-11 DIAGNOSIS — K2940 Chronic atrophic gastritis without bleeding: Secondary | ICD-10-CM | POA: Insufficient documentation

## 2014-06-11 DIAGNOSIS — Z6836 Body mass index (BMI) 36.0-36.9, adult: Secondary | ICD-10-CM | POA: Insufficient documentation

## 2014-06-11 DIAGNOSIS — K279 Peptic ulcer, site unspecified, unspecified as acute or chronic, without hemorrhage or perforation: Secondary | ICD-10-CM

## 2014-06-11 DIAGNOSIS — F319 Bipolar disorder, unspecified: Secondary | ICD-10-CM | POA: Insufficient documentation

## 2014-06-11 HISTORY — PX: ESOPHAGOGASTRODUODENOSCOPY: SHX5428

## 2014-06-11 SURGERY — EGD (ESOPHAGOGASTRODUODENOSCOPY)
Anesthesia: Moderate Sedation

## 2014-06-11 MED ORDER — MEPERIDINE HCL 100 MG/ML IJ SOLN
INTRAMUSCULAR | Status: AC
  Filled 2014-06-11: qty 2

## 2014-06-11 MED ORDER — MIDAZOLAM HCL 5 MG/5ML IJ SOLN
INTRAMUSCULAR | Status: AC
  Filled 2014-06-11: qty 10

## 2014-06-11 MED ORDER — SODIUM CHLORIDE 0.9 % IV SOLN
INTRAVENOUS | Status: DC
  Administered 2014-06-11: 14:00:00 via INTRAVENOUS

## 2014-06-11 MED ORDER — OMEPRAZOLE 20 MG PO CPDR: 20.0000 mg | DELAYED_RELEASE_CAPSULE | Freq: Two times a day (BID) | ORAL | Status: DC

## 2014-06-11 MED ORDER — MIDAZOLAM HCL 5 MG/5ML IJ SOLN
INTRAMUSCULAR | Status: DC | PRN
  Administered 2014-06-11: 1 mg via INTRAVENOUS
  Administered 2014-06-11 (×2): 2 mg via INTRAVENOUS
  Administered 2014-06-11 (×2): 1 mg via INTRAVENOUS

## 2014-06-11 MED ORDER — MEPERIDINE HCL 100 MG/ML IJ SOLN
INTRAMUSCULAR | Status: DC | PRN
  Administered 2014-06-11: 25 mg via INTRAVENOUS
  Administered 2014-06-11: 50 mg via INTRAVENOUS
  Administered 2014-06-11: 25 mg via INTRAVENOUS

## 2014-06-11 MED ORDER — LIDOCAINE VISCOUS 2 % MT SOLN
OROMUCOSAL | Status: AC
  Filled 2014-06-11: qty 15

## 2014-06-11 MED ORDER — STERILE WATER FOR IRRIGATION IR SOLN
Status: DC | PRN
  Administered 2014-06-11: 15:00:00

## 2014-06-11 MED ORDER — LIDOCAINE VISCOUS 2 % MT SOLN
OROMUCOSAL | Status: DC | PRN
  Administered 2014-06-11: 1 via OROMUCOSAL

## 2014-06-11 NOTE — H&P (Signed)
Primary Care Physician:  Delphina Cahill, MD Primary Gastroenterologist:  Dr. Oneida Alar  Pre-Procedure History & Physical: HPI:  Teresa Tran is a 33 y.o. female here for peptic ulcer disease  Past Medical History  Diagnosis Date  . Headache(784.0)   . Bipolar disorder   . Depression   . Ulcer   . Vaginal irritation 02/12/2014  . Yeast infection 02/12/2014  . Obesity     Past Surgical History  Procedure Laterality Date  . Tubal ligation    . Cholecystectomy  Feb 2015    Dr. Ladona Horns, [place]  . Esophagogastroduodenoscopy (egd) with esophageal dilation N/A 02/11/2014    Dr. 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 Authorizing Provider  ALPRAZolam Duanne Moron) 1 MG tablet Take one three times a day and two at bedtime 04/12/14  Yes Levonne Spiller, MD  lithium 300 MG tablet Take three at bedtime 05/08/14  Yes Levonne Spiller, MD  omeprazole (PRILOSEC) 20 MG capsule Take 1 capsule (20 mg total) by mouth daily. 06/06/14  Yes Orvil Feil, NP  QUEtiapine (SEROQUEL) 50 MG tablet Take one tablet twice a day and 2 at bedtime 05/08/14  Yes Levonne Spiller, MD  Vortioxetine HBr (BRINTELLIX) 20 MG TABS Take 20 mg by mouth daily. 05/08/14  Yes Levonne Spiller, MD    Allergies as of 06/06/2014 - Review Complete 06/06/2014  Allergen Reaction Noted  . Risperdal [risperidone] Other (See Comments) 10/11/2012  . Macrobid [nitrofurantoin monohyd macro] Itching 01/09/2014    Family History  Problem Relation Age of Onset  . Depression Father   . Anxiety disorder Father   . Depression Sister   . Anxiety disorder Sister   . Depression Brother   . Anxiety disorder Brother   . ADD / ADHD Neg Hx   . Alcohol abuse Neg Hx   . Drug abuse Neg Hx   . Bipolar disorder Neg Hx   . Dementia Neg Hx   . OCD Neg Hx   . Paranoid behavior Neg Hx   . Schizophrenia Neg Hx   . Seizures Neg Hx   . Sexual abuse Neg Hx   .  Physical abuse Neg Hx   . Colon cancer Neg Hx   . Cancer - Other Paternal Grandfather     History   Social History  . Marital Status: Married    Spouse Name: N/A    Number of Children: N/A  . Years of Education: N/A   Occupational History  . Not on file.   Social History Main Topics  . Smoking status: Never Smoker   . Smokeless tobacco: Never Used     Comment: NEVER SMOKED  . Alcohol Use: No  . Drug Use: No  . Sexual Activity: Yes    Birth Control/ Protection: Surgical     Comment: tubal   Other Topics Concern  . Not on file   Social History Narrative  . No narrative on file    Review of Systems: See HPI, otherwise negative ROS   Physical Exam: BP 127/82  Pulse 79  Temp(Src) 98.1 F (36.7 C) (Oral)  Resp 20  Ht 5' (1.524 m)  Wt 189 lb (85.73 kg)  BMI 36.91 kg/m2  SpO2 100%  LMP 05/30/2014 General:   Alert,  pleasant and cooperative in NAD Head:  Normocephalic and atraumatic. Neck:  Supple; Lungs:  Clear throughout to auscultation.    Heart:  Regular rate and rhythm. Abdomen:  Soft, nontender and nondistended. Normal bowel sounds, without guarding, and without rebound.   Neurologic:  Alert and  oriented x4;  grossly normal neurologically.  Impression/Plan:     PUD  PLAN:  REPEAT EGD TO CONFIRM ULCERS ARE HEALED.

## 2014-06-11 NOTE — Progress Notes (Signed)
Cc to PCP 

## 2014-06-11 NOTE — Discharge Instructions (Signed)
You have gastritis & DUODENITIS DUE TO YOUR USING MEDS LIKE IBUPROFEN. YOUR ULCERS HAVE NOT HEALED COMPLETELY. I biopsied your stomach.   CONTINUE OMEPRAZOLE.  TAKE 30 MINUTES PRIOR TO YOUR MEALS TWICE DAILY.  FOLLOW A LOW FAT DIET. SEE INFO BELOW.  YOUR BIOPSY WILL BE BACK IN 7 TO 10 DAYS.  FOLLOW UP IN NOV 2015.     UPPER ENDOSCOPY AFTER CARE Read the instructions outlined below and refer to this sheet in the next week. These discharge instructions provide you with general information on caring for yourself after you leave the hospital. While your treatment has been planned according to the most current medical practices available, unavoidable complications occasionally occur. If you have any problems or questions after discharge, call DR. Hassel Uphoff, [phone].  ACTIVITY  You may resume your regular activity, but move at a slower pace for the next 24 hours.   Take frequent rest periods for the next 24 hours.   Walking will help get rid of the air and reduce the bloated feeling in your belly (abdomen).   No driving for 24 hours (because of the medicine (anesthesia) used during the test).   You may shower.   Do not sign any important legal documents or operate any machinery for 24 hours (because of the anesthesia used during the test).    NUTRITION  Drink plenty of fluids.   You may resume your normal diet as instructed by your doctor.   Begin with a light meal and progress to your normal diet. Heavy or fried foods are harder to digest and may make you feel sick to your stomach (nauseated).   Avoid alcoholic beverages for 24 hours or as instructed.    MEDICATIONS  You may resume your normal medications.   WHAT YOU CAN EXPECT TODAY  Some feelings of bloating in the abdomen.   Passage of more gas than usual.    IF YOU HAD A BIOPSY TAKEN DURING THE UPPER ENDOSCOPY:  Eat a soft diet IF YOU HAVE NAUSEA, BLOATING, ABDOMINAL PAIN, OR VOMITING.    FINDING OUT THE  RESULTS OF YOUR TEST Not all test results are available during your visit. DR. Oneida Alar WILL CALL YOU WITHIN 7 DAYS OF YOUR PROCEDUE WITH YOUR RESULTS. Do not assume everything is normal if you have not heard from DR. Dnya Hickle IN ONE WEEK, CALL HER OFFICE AT [phone].  SEEK IMMEDIATE MEDICAL ATTENTION AND CALL THE OFFICE: [phone] IF:  You have more than a spotting of blood in your stool.   Your belly is swollen (abdominal distention).   You are nauseated or vomiting.   You have a temperature over 101F.   You have abdominal pain or discomfort that is severe or gets worse throughout the day.   Gastritis/DUODENITIS  Gastritis is an inflammation (the body's way of reacting to injury and/or infection) of the stomach. DUODENITIS is an inflammation (the body's way of reacting to injury and/or infection) of the FIRST PART OF THE SMALL INTESTINES. It is often caused by bacterial (germ) infections. It can also be caused BY ASPIRIN, BC/GOODY POWDER'S, (IBUPROFEN) MOTRIN, OR ALEVE (NAPROXEN), chemicals (including alcohol), SPICY FOODS, and medications. This illness may be associated with generalized malaise (feeling tired, not well), UPPER ABDOMINAL STOMACH cramps, and fever. One common bacterial cause of gastritis is an organism known as H. Pylori. This can be treated with antibiotics.     REFLUX   TREATMENT There are a number of medicines used to treat reflux including: Antacids.  ZANTAC  Proton-pump inhibitors: PRILOSEC   HOME CARE INSTRUCTIONS Eat 2-3 hours before going to bed.  Try to reach and maintain a healthy weight. LOSE 10-20 LBS Do not eat just a few very large meals. Instead, eat 4 TO 6 smaller meals throughout the day.  Try to identify foods and beverages that make your symptoms worse, and avoid these.  Avoid tight clothing.  Do not exercise right after eating.   Low-Fat Diet BREADS, CEREALS, PASTA, RICE, DRIED PEAS, AND BEANS These products are high in carbohydrates  and most are low in fat. Therefore, they can be increased in the diet as substitutes for fatty foods. They too, however, contain calories and should not be eaten in excess. Cereals can be eaten for snacks as well as for breakfast.  Include foods that contain fiber (fruits, vegetables, whole grains, and legumes). Research shows that fiber may lower blood cholesterol levels, especially the water-soluble fiber found in fruits, vegetables, oat products, and legumes. FRUITS AND VEGETABLES It is good to eat fruits and vegetables. Besides being sources of fiber, both are rich in vitamins and some minerals. They help you get the daily allowances of these nutrients. Fruits and vegetables can be used for snacks and desserts. MEATS Limit lean meat, chicken, [place], and fish to no more than 6 ounces per day. Beef, Pork, and Lamb Use lean cuts of beef, pork, and lamb. Lean cuts include:  Extra-lean ground beef.  Arm roast.  Sirloin tip.  Center-cut ham.  Round steak.  Loin chops.  Rump roast.  Tenderloin.  Trim all fat off the outside of meats before cooking. It is not necessary to severely decrease the intake of red meat, but lean choices should be made. Lean meat is rich in protein and contains a highly absorbable form of iron. Premenopausal women, in particular, should avoid reducing lean red meat because this could increase the risk for low red blood cells (iron-deficiency anemia).  Chicken and [place] These are good sources of protein. The fat of poultry can be reduced by removing the skin and underlying fat layers before cooking. Chicken and [place] can be substituted for lean red meat in the diet. Poultry should not be fried or covered with high-fat sauces. Fish and Shellfish Fish is a good source of protein. Shellfish contain cholesterol, but they usually are low in saturated fatty acids. The preparation of fish is important. Like chicken and [place], they should not be fried or covered with high-fat  sauces. EGGS Egg whites contain no fat or cholesterol. They can be eaten often. Try 1 to 2 egg whites instead of whole eggs in recipes or use egg substitutes that do not contain yolk.  MILK AND DAIRY PRODUCTS Use skim or 1% milk instead of 2% or whole milk. Decrease whole milk, natural, and processed cheeses. Use nonfat or low-fat (2%) cottage cheese or low-fat cheeses made from vegetable oils. Choose nonfat or low-fat (1 to 2%) yogurt. Experiment with evaporated skim milk in recipes that call for heavy cream. Substitute low-fat yogurt or low-fat cottage cheese for sour cream in dips and salad dressings. Have at least 2 servings of low-fat dairy products, such as 2 glasses of skim (or 1%) milk each day to help get your daily calcium intake.  FATS AND OILS Butterfat, lard, and beef fats are high in saturated fat and cholesterol. These should be avoided.Vegetable fats do not contain cholesterol. AVOID coconut oil, palm oil, and palm kernel oil, WHICH are very high in saturated fats. These should  be limited. These fats are often used in bakery goods, processed foods, popcorn, oils, and nondairy creamers. Vegetable shortenings and some peanut butters contain hydrogenated oils, which are also saturated fats. Read the labels on these foods and check for saturated vegetable oils.  Desirable liquid vegetable oils are corn oil, cottonseed oil, olive oil, canola oil, safflower oil, soybean oil, and sunflower oil. Peanut oil is not as good, but small amounts are acceptable. Buy a heart-healthy tub margarine that has no partially hydrogenated oils in the ingredients. AVOID Mayonnaise and salad dressings often are made from unsaturated fats.  OTHER EATING TIPS Snacks  Most sweets should be limited as snacks. They tend to be rich in calories and fats, and their caloric content outweighs their nutritional value. Some good choices in snacks are graham crackers, melba toast, soda crackers, bagels (no egg), English  muffins, fruits, and vegetables. These snacks are preferable to snack crackers, [place] fries, and chips. Popcorn should be air-popped or cooked in small amounts of liquid vegetable oil.  Desserts Eat fruit, low-fat yogurt, and fruit ices instead of pastries, cake, and cookies. Sherbet, angel food cake, gelatin dessert, frozen low-fat yogurt, or other frozen products that do not contain saturated fat (pure fruit juice bars, frozen ice pops) are also acceptable.   COOKING METHODS Choose those methods that use little or no fat. They include: Poaching.  Braising.  Steaming.  Grilling.  Baking.  Stir-frying.  Broiling.  Microwaving.  Foods can be cooked in a nonstick pan without added fat, or use a nonfat cooking spray in regular cookware. Limit fried foods and avoid frying in saturated fat. Add moisture to lean meats by using water, broth, cooking wines, and other nonfat or low-fat sauces along with the cooking methods mentioned above. Soups and stews should be chilled after cooking. The fat that forms on top after a few hours in the refrigerator should be skimmed off. When preparing meals, avoid using excess salt. Salt can contribute to raising blood pressure in some people.  EATING AWAY FROM HOME Order entres, potatoes, and vegetables without sauces or butter. When meat exceeds the size of a deck of cards (3 to 4 ounces), the rest can be taken home for another meal. Choose vegetable or fruit salads and ask for low-calorie salad dressings to be served on the side. Use dressings sparingly. Limit high-fat toppings, such as bacon, crumbled eggs, cheese, sunflower seeds, and olives. Ask for heart-healthy tub margarine instead of butter.

## 2014-06-11 NOTE — Progress Notes (Signed)
REVIEWED.  

## 2014-06-12 NOTE — Op Note (Signed)
[facility] [address] [place], [address]   ENDOSCOPY PROCEDURE REPORT  PATIENT: Teresa Tran, Teresa Tran  MR#: [id number] BIRTHDATE: 28-Aug-1981 , 32  yrs. old GENDER: Female  ENDOSCOPIST: Barney Drain, MD REFERRED JQ:BHAL Hall, M.D.  PROCEDURE DATE: 06/11/2014 PROCEDURE:   EGD w/ biopsy  INDICATIONS:Screening for ulcers. USING PPI PRN. USING NSAIDS RARELY. MEDICATIONS: Demerol 100 mg IV and Versed 7 mg IV TOPICAL ANESTHETIC:   Viscous Xylocaine  DESCRIPTION OF PROCEDURE:     Physical exam was performed.  Informed consent was obtained from the patient after explaining the benefits, risks, and alternatives to the procedure.  The patient was connected to the monitor and placed in the left lateral position.  Continuous oxygen was provided by nasal cannula and IV medicine administered through an indwelling cannula.  After administration of sedation, the patients esophagus was intubated and the EG-[id number] ([id number])  endoscope was advanced under direct visualization to the second portion of the duodenum.  The scope was removed slowly by carefully examining the color, texture, anatomy, and integrity of the mucosa on the way out.  The patient was recovered in endoscopy and discharged home in satisfactory condition.   ESOPHAGUS: The mucosa of the esophagus appeared normal.   STOMACH: ONE SUPERFICIAL ULCER SEEN IN ANTRUM.  EROSIVE GASTRICTIS IN ANTRUM.  BIOPSIED VIA COLD FORCEPS.   DUODENUM: Mild duodenal inflammation was found in the duodenal bulb.   The duodenal mucosa showed no abnormalities in the 2nd part of the duodenum. COMPLICATIONS:   None  ENDOSCOPIC IMPRESSION: 1.   PARTIALLY HEALED ULCERS 2.   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  BIOPSIED VIA COLD FORCEPS. 3.   MILD DUODENITIS  RECOMMENDATIONS: CONTINUE OMEPRAZOLE.  TAKE 30 MINUTES PRIOR TO YOUR MEALS TWICE DAILY. FOLLOW A LOW FAT DIET. BIOPSY WILL BE BACK IN 7 TO 10 DAYS.  FOLLOW UP IN NOV  2015.   REPEAT EXAM:   _______________________________ Lorrin MaisBarney Drain, MD 06/12/2014 12:33 PM

## 2014-06-13 ENCOUNTER — Telehealth: Payer: Self-pay

## 2014-06-13 NOTE — Telephone Encounter (Signed)
Yes. For nutrition support.

## 2014-06-13 NOTE — Telephone Encounter (Signed)
Pt would like for [name] to send a referral to [facility] for her diabetes. Please advise

## 2014-06-14 ENCOUNTER — Encounter (HOSPITAL_COMMUNITY): Payer: Self-pay | Admitting: Gastroenterology

## 2014-06-14 NOTE — Telephone Encounter (Signed)
Referral has been made.

## 2014-06-18 ENCOUNTER — Ambulatory Visit (INDEPENDENT_AMBULATORY_CARE_PROVIDER_SITE_OTHER): Payer: BC Managed Care – PPO | Admitting: Psychiatry

## 2014-06-18 ENCOUNTER — Ambulatory Visit (HOSPITAL_COMMUNITY): Payer: Self-pay | Admitting: Psychiatry

## 2014-06-18 DIAGNOSIS — F3162 Bipolar disorder, current episode mixed, moderate: Secondary | ICD-10-CM
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
## 2014-06-18 NOTE — Patient Instructions (Signed)
Discussed orally 

## 2014-06-19 ENCOUNTER — Ambulatory Visit (HOSPITAL_COMMUNITY): Payer: Self-pay | Admitting: Psychiatry

## 2014-06-20 ENCOUNTER — Ambulatory Visit (HOSPITAL_COMMUNITY): Payer: Self-pay | Admitting: Psychiatry

## 2014-06-29 ENCOUNTER — Telehealth: Payer: Self-pay | Admitting: Gastroenterology

## 2014-06-29 MED ORDER — BIS SUBCIT-METRONID-TETRACYC 140-125-125 MG PO CAPS: ORAL_CAPSULE | ORAL | Status: DC

## 2014-06-29 NOTE — Telephone Encounter (Signed)
PLEASE CALL PT. She has H. Pylori gastritis. She TAKES SEROQUEL SO SHE NEEDS and so she needs PYLERA 3 PILLS QID FOR 10 DAYS. CONTINUE OMEPRAZOLE. The meds can cause nausea, vomiting, abd cramps, loose stools, black colored stools, and metallic taste in her mouth. IF SHE IS SEXUALLY ACTIVE, SHE NEEDS TO USE CONDOMS WITH HER BIRTH CONTROL OR CONDOMS TO PREVENT PREGNANCY WHILE TAKING THE ABX.   CONTINUE OMEPRAZOLE.  TAKE 30 MINUTES PRIOR TO YOUR MEALS TWICE DAILY. FOLLOW A LOW FAT DIET.  FOLLOW UP IN NOV 2015 E30 H PYLORI GASTRITIS/PUD. HE MAY NEED A REPEAT EGD TO ASSESS HEALING AFTER HIS H PYLORI GASTRITIS IS TREATED.Marland Kitchen

## 2014-07-01 NOTE — Telephone Encounter (Signed)
PATIENT NIC'D  °

## 2014-07-01 NOTE — Telephone Encounter (Signed)
LMOM to call.

## 2014-07-01 NOTE — Telephone Encounter (Signed)
Pt returned call and was informed. She also said that she had requested a referral to dietician and Vicente Males had said that she could do that. She has not heard anything. Please advise!

## 2014-07-01 NOTE — Telephone Encounter (Signed)
Referral was sent on 8/7 to dietician at [facility] I have sent her a message to make sure she received it waiting to hear back from her

## 2014-07-02 ENCOUNTER — Other Ambulatory Visit: Payer: Self-pay | Admitting: Gastroenterology

## 2014-07-02 DIAGNOSIS — E119 Type 2 diabetes mellitus without complications: Secondary | ICD-10-CM

## 2014-07-05 ENCOUNTER — Ambulatory Visit (INDEPENDENT_AMBULATORY_CARE_PROVIDER_SITE_OTHER): Payer: BC Managed Care – PPO | Admitting: Psychiatry

## 2014-07-05 ENCOUNTER — Encounter (HOSPITAL_COMMUNITY): Payer: Self-pay | Admitting: Psychiatry

## 2014-07-05 VITALS — BP 120/75 | HR 68 | Ht 60.0 in | Wt 187.6 lb

## 2014-07-05 DIAGNOSIS — F3162 Bipolar disorder, current episode mixed, moderate: Secondary | ICD-10-CM

## 2014-07-05 MED ORDER — QUETIAPINE FUMARATE 50 MG PO TABS: ORAL_TABLET | ORAL | Status: DC

## 2014-07-05 MED ORDER — ALPRAZOLAM 1 MG PO TABS: ORAL_TABLET | ORAL | Status: DC

## 2014-07-05 MED ORDER — LITHIUM CARBONATE 300 MG PO TABS: ORAL_TABLET | ORAL | Status: DC

## 2014-07-05 MED ORDER — VORTIOXETINE HBR 20 MG PO TABS: 20.0000 mg | ORAL_TABLET | Freq: Every day | ORAL | Status: DC
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
## 2014-07-08 ENCOUNTER — Ambulatory Visit (HOSPITAL_COMMUNITY): Payer: Self-pay | Admitting: Psychiatry

## 2014-07-08 ENCOUNTER — Ambulatory Visit (INDEPENDENT_AMBULATORY_CARE_PROVIDER_SITE_OTHER): Payer: BC Managed Care – PPO | Admitting: Psychiatry

## 2014-07-08 DIAGNOSIS — F319 Bipolar disorder, unspecified: Secondary | ICD-10-CM

## 2014-07-08 DIAGNOSIS — F3162 Bipolar disorder, current episode mixed, moderate: Secondary | ICD-10-CM
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
## 2014-07-08 NOTE — Patient Instructions (Signed)
Discussed orally 

## 2014-07-29 ENCOUNTER — Ambulatory Visit (HOSPITAL_COMMUNITY): Payer: Self-pay | Admitting: Psychiatry

## 2014-08-15 ENCOUNTER — Ambulatory Visit (INDEPENDENT_AMBULATORY_CARE_PROVIDER_SITE_OTHER): Payer: BC Managed Care – PPO | Admitting: Adult Health

## 2014-08-15 ENCOUNTER — Encounter: Payer: Self-pay | Admitting: Adult Health

## 2014-08-15 VITALS — BP 130/82 | Ht 60.0 in | Wt 193.5 lb

## 2014-08-15 DIAGNOSIS — M545 Low back pain, unspecified: Secondary | ICD-10-CM

## 2014-08-15 DIAGNOSIS — N760 Acute vaginitis: Secondary | ICD-10-CM

## 2014-08-15 DIAGNOSIS — B9689 Other specified bacterial agents as the cause of diseases classified elsewhere: Secondary | ICD-10-CM | POA: Insufficient documentation

## 2014-08-15 DIAGNOSIS — A499 Bacterial infection, unspecified: Secondary | ICD-10-CM

## 2014-08-15 DIAGNOSIS — N898 Other specified noninflammatory disorders of vagina: Secondary | ICD-10-CM

## 2014-08-15 HISTORY — DX: Other specified bacterial agents as the cause of diseases classified elsewhere: B96.89

## 2014-08-15 HISTORY — DX: Low back pain, unspecified: M54.50

## 2014-08-15 HISTORY — DX: Other specified noninflammatory disorders of vagina: N89.8

## 2014-08-15 LAB — POCT URINALYSIS DIPSTICK
Blood, UA: NEGATIVE
Glucose, UA: NEGATIVE
Leukocytes, UA: NEGATIVE
Nitrite, UA: NEGATIVE
Protein, UA: NEGATIVE

## 2014-08-15 LAB — POCT WET PREP (WET MOUNT): WBC, Wet Prep HPF POC: POSITIVE

## 2014-08-15 MED ORDER — METRONIDAZOLE 500 MG PO TABS: 500.0000 mg | ORAL_TABLET | Freq: Two times a day (BID) | ORAL | Status: DC

## 2014-08-15 MED ORDER — CYCLOBENZAPRINE HCL 10 MG PO TABS: 10.0000 mg | ORAL_TABLET | Freq: Three times a day (TID) | ORAL | Status: DC | PRN

## 2014-08-15 NOTE — Patient Instructions (Signed)
Back Pain, Adult Low back pain is very common. About 1 in 5 people have back pain.The cause of low back pain is rarely dangerous. The pain often gets better over time.About half of people with a sudden onset of back pain feel better in just 2 weeks. About 8 in 10 people feel better by 6 weeks.  CAUSES Some common causes of back pain include:  Strain of the muscles or ligaments supporting the spine.  Wear and tear (degeneration) of the spinal discs.  Arthritis.  Direct injury to the back. DIAGNOSIS Most of the time, the direct cause of low back pain is not known.However, back pain can be treated effectively even when the exact cause of the pain is unknown.Answering your caregiver's questions about your overall health and symptoms is one of the most accurate ways to make sure the cause of your pain is not dangerous. If your caregiver needs more information, he or she may order lab work or imaging tests (X-rays or MRIs).However, even if imaging tests show changes in your back, this usually does not require surgery. HOME CARE INSTRUCTIONS For many people, back pain returns.Since low back pain is rarely dangerous, it is often a condition that people can learn to manageon their own.   Remain active. It is stressful on the back to sit or stand in one place. Do not sit, drive, or stand in one place for more than 30 minutes at a time. Take short walks on level surfaces as soon as pain allows.Try to increase the length of time you walk each day.  Do not stay in bed.Resting more than 1 or 2 days can delay your recovery.  Do not avoid exercise or work.Your body is made to move.It is not dangerous to be active, even though your back may hurt.Your back will likely heal faster if you return to being active before your pain is gone.  Pay attention to your body when you bend and lift. Many people have less discomfortwhen lifting if they bend their knees, keep the load close to their bodies,and  avoid twisting. Often, the most comfortable positions are those that put less stress on your recovering back.  Find a comfortable position to sleep. Use a firm mattress and lie on your side with your knees slightly bent. If you lie on your back, put a pillow under your knees.  Only take over-the-counter or prescription medicines as directed by your caregiver. Over-the-counter medicines to reduce pain and inflammation are often the most helpful.Your caregiver may prescribe muscle relaxant drugs.These medicines help dull your pain so you can more quickly return to your normal activities and healthy exercise.  Put ice on the injured area.  Put ice in a plastic bag.  Place a towel between your skin and the bag.  Leave the ice on for 15-20 minutes, 03-04 times a day for the first 2 to 3 days. After that, ice and heat may be alternated to reduce pain and spasms.  Ask your caregiver about trying back exercises and gentle massage. This may be of some benefit.  Avoid feeling anxious or stressed.Stress increases muscle tension and can worsen back pain.It is important to recognize when you are anxious or stressed and learn ways to manage it.Exercise is a great option. SEEK MEDICAL CARE IF:  You have pain that is not relieved with rest or medicine.  You have pain that does not improve in 1 week.  You have new symptoms.  You are generally not feeling well. SEEK   IMMEDIATE MEDICAL CARE IF:   You have pain that radiates from your back into your legs.  You develop new bowel or bladder control problems.  You have unusual weakness or numbness in your arms or legs.  You develop nausea or vomiting.  You develop abdominal pain.  You feel faint. Document Released: 10/25/2005 Document Revised: 04/25/2012 Document Reviewed: 02/26/2014 [facility] Patient Information 2015 [place], [place]. This information is not intended to replace advice given to you by your health care provider. Make sure you  discuss any questions you have with your health care provider. Bacterial Vaginosis Bacterial vaginosis is a vaginal infection that occurs when the normal balance of bacteria in the vagina is disrupted. It results from an overgrowth of certain bacteria. This is the most common vaginal infection in women of childbearing age. Treatment is important to prevent complications, especially in pregnant women, as it can cause a premature delivery. CAUSES  Bacterial vaginosis is caused by an increase in harmful bacteria that are normally present in smaller amounts in the vagina. Several different kinds of bacteria can cause bacterial vaginosis. However, the reason that the condition develops is not fully understood. RISK FACTORS Certain activities or behaviors can put you at an increased risk of developing bacterial vaginosis, including:  Having a new sex partner or multiple sex partners.  Douching.  Using an intrauterine device (IUD) for contraception. Women do not get bacterial vaginosis from toilet seats, bedding, swimming pools, or contact with objects around them. SIGNS AND SYMPTOMS  Some women with bacterial vaginosis have no signs or symptoms. Common symptoms include:  Grey vaginal discharge.  A fishlike odor with discharge, especially after sexual intercourse.  Itching or burning of the vagina and vulva.  Burning or pain with urination. DIAGNOSIS  Your health care provider will take a medical history and examine the vagina for signs of bacterial vaginosis. A sample of vaginal fluid may be taken. Your health care provider will look at this sample under a microscope to check for bacteria and abnormal cells. A vaginal pH test may also be done.  TREATMENT  Bacterial vaginosis may be treated with antibiotic medicines. These may be given in the form of a pill or a vaginal cream. A second round of antibiotics may be prescribed if the condition comes back after treatment.  HOME CARE INSTRUCTIONS    Only take over-the-counter or prescription medicines as directed by your health care provider.  If antibiotic medicine was prescribed, take it as directed. Make sure you finish it even if you start to feel better.  Do not have sex until treatment is completed.  Tell all sexual partners that you have a vaginal infection. They should see their health care provider and be treated if they have problems, such as a mild rash or itching.  Practice safe sex by using condoms and only having one sex partner. SEEK MEDICAL CARE IF:   Your symptoms are not improving after 3 days of treatment.  You have increased discharge or pain.  You have a fever. MAKE SURE YOU:   Understand these instructions.  Will watch your condition.  Will get help right away if you are not doing well or get worse. FOR MORE INFORMATION  Centers for Disease Control and Prevention, Division of STD Prevention: [employer] American Sexual Health Association (ASHA): www.ashastd.org  Document Released: 10/25/2005 Document Revised: 08/15/2013 Document Reviewed: 06/06/2013 [facility] Patient Information 2015 [place], [place]. This information is not intended to replace advice given to you by your health care provider.  Make sure you discuss any questions you have with your health care provider. Push fluids Try stretches Follow up in 2 weeks

## 2014-08-15 NOTE — Progress Notes (Signed)
Subjective:     Patient ID: Teresa Tran, female   DOB: [birth year], 33 y.o.   MRN: [id number]  HPI Teresa Tran is a 33 year old white female in complaining of vaginal discharge and back pain,feels like can't sit up straight,no burning or pain with urination but has some stomach cramps, no bowel changes.  Review of Systems See HPI Reviewed past medical,surgical, social and family history. Reviewed medications and allergies.     Objective:   Physical Exam BP 130/82  Ht 5' (1.524 m)  Wt 193 lb 8 oz (87.771 kg)  BMI 37.79 kg/m2  LMP 07/30/2014 Skin warm and dry.Pelvic: external genitalia is normal in appearance, vagina: white discharge with odor, cervix:smooth and bulbous, uterus: normal size, shape and contour, mildly tender at bladder, no masses felt, adnexa: no masses or tenderness noted. Wet prep: + for clue cells and +WBCs.Has tenderness low back, ?CVAT Urine dipstick was negative    Assessment:     Vaginal discharge BV Low back pain,?muscle     Plan:    Follow up in 2 weeks  UA C&S sent Rx flagyl 500 mg 1 bid x 7 days, no alcohol, review handout on BV   Push fluids Try ice on low back Rx flexeril 10 mg #30 1 every 8 hours prn no refills Try stretches that was demonstrated

## 2014-08-16 ENCOUNTER — Ambulatory Visit (HOSPITAL_COMMUNITY): Payer: Self-pay | Admitting: Psychiatry

## 2014-08-16 LAB — URINALYSIS
Bilirubin Urine: NEGATIVE
Glucose, UA: NEGATIVE mg/dL
Hgb urine dipstick: NEGATIVE
Ketones, ur: NEGATIVE mg/dL
LEUKOCYTES UA: NEGATIVE
NITRITE: NEGATIVE
PH: 5 (ref 5.0–8.0)
Protein, ur: NEGATIVE mg/dL
SPECIFIC GRAVITY, URINE: 1.024 (ref 1.005–1.030)
Urobilinogen, UA: 0.2 mg/dL (ref 0.0–1.0)

## 2014-08-16 LAB — URINE CULTURE
Colony Count: NO GROWTH
ORGANISM ID, BACTERIA: NO GROWTH

## 2014-08-19 ENCOUNTER — Telehealth: Payer: Self-pay | Admitting: Adult Health

## 2014-08-19 ENCOUNTER — Ambulatory Visit (HOSPITAL_COMMUNITY): Payer: Self-pay | Admitting: Psychiatry

## 2014-08-19 ENCOUNTER — Encounter: Payer: BC Managed Care – PPO | Attending: Gastroenterology | Admitting: Nutrition

## 2014-08-19 ENCOUNTER — Encounter: Payer: Self-pay | Admitting: Nutrition

## 2014-08-19 VITALS — Ht 60.0 in | Wt 190.6 lb

## 2014-08-19 DIAGNOSIS — R635 Abnormal weight gain: Secondary | ICD-10-CM | POA: Diagnosis not present

## 2014-08-19 DIAGNOSIS — E669 Obesity, unspecified: Secondary | ICD-10-CM | POA: Insufficient documentation

## 2014-08-19 DIAGNOSIS — Z713 Dietary counseling and surveillance: Secondary | ICD-10-CM | POA: Diagnosis not present

## 2014-08-19 DIAGNOSIS — T50905A Adverse effect of unspecified drugs, medicaments and biological substances, initial encounter: Secondary | ICD-10-CM

## 2014-08-19 DIAGNOSIS — Z6836 Body mass index (BMI) 36.0-36.9, adult: Secondary | ICD-10-CM | POA: Insufficient documentation

## 2014-08-19 NOTE — Progress Notes (Signed)
  Medical Nutrition Therapy:  Appt start time: 1030 end time:  1130.   Assessment:  Primary concerns today: Prediabetes. A1C was 5.8% 05/2014. Lives with husband. Has three kids. Stays at home. Has lost 30 lbs in the 10 months. Walks for activity and goes to the [facility] a few times per week. She does the shopping  And does the cooking. Most foods are baked/broiled. Eats out 1 every 2 weeks.   Preferred Learning Style:   No preference indicated   Learning Readiness:     Ready  Change in progress   MEDICATIONS: See list   DIETARY INTAKE:  24-hr recall:  B ( AM): skips Snk ( AM):   L ( PM): [place] sandwich on white or honey wheat with mustard and some ketchup,  Potato chips, water Snk ( PM): pack of nabs, water D ( PM): spaghetti 1 1/2 c, peas, 1/2 c and corn on the corn- 1/2 ear, water. Snk ( PM): sometimes a pack of nabs, fruit Beverages: water with crystal light, water  Usual physical activity: walking and working out at the [employer] needs: 1200 calories 135 g carbohydrates 90 g protein 33 g fat  Progress Towards Goal(s):  In progress.   Nutritional Diagnosis:  NB-1.1 Food and nutrition-related knowledge deficit As related to Prediabetes and being overweight.  As evidenced by A1C of 5.8% and BMI >30.Marland Kitchen    Intervention:  Nutrition counseling and healthy weight loss.. Plan:  Aim for 2-3 Carb Choices per meal (30-45 grams) +/- 1 either way  Include protein in moderation with your meals and snacks Consider reading food labels for Total Carbohydrate  Consider  increasing your activity level by 30 minutes daily as tolerated Continue taking medication as directed by MD Goal: 1. Lose 1 lb per week. 2. Eat breakfast daily. 3. No snacks between meals. 4. Get A1C below 5.7% in 3-6 months. 5. Keep a food journal.  Teaching Method Utilized:  Visual Auditory Hands on  Handouts given during visit include: Carb Counting and Food Label handouts Meal Plan Card My  Plate  Barriers to learning/adherence to lifestyle change: none  Demonstrated degree of understanding via:  Teach Back   Monitoring/Evaluation:  Dietary intake, exercise, meal planning, and body weight in 6 weeks.

## 2014-08-19 NOTE — Telephone Encounter (Signed)
Spoke with pt. Pt's urine culture result showed no growth. Pt voiced understanding. [place]

## 2014-08-19 NOTE — Patient Instructions (Addendum)
.  Plan:  Aim for 2-3 Carb Choices per meal (30-45 grams) +/- 1 either way  Include protein in moderation with your meals and snacks Consider reading food labels for Total Carbohydrate  Consider  increasing your activity level by 30 minutes daily as tolerated Continue taking medication as directed by MD Goal: 1. Lose 1 lb per week. 2. Eat breakfast daily. 3. No snacks between meals. 4. Get A1C below 5.7% in 3-6 months.

## 2014-08-21 ENCOUNTER — Telehealth: Payer: Self-pay | Admitting: *Deleted

## 2014-08-21 NOTE — Telephone Encounter (Signed)
Spoke with pt. Pt was seen last week. Urine was clear then. Pt states it seems like her bladder is not emptying out completely. Worse the last 3 days. Advised we would need to recheck her urine. Pt voiced understanding. Call transferred to front desk for appt. [place]

## 2014-08-22 ENCOUNTER — Ambulatory Visit: Payer: BC Managed Care – PPO | Admitting: Adult Health

## 2014-08-22 LAB — LITHIUM LEVEL: Lithium Lvl: 0.8 mEq/L (ref 0.80–1.40)

## 2014-08-23 ENCOUNTER — Ambulatory Visit (HOSPITAL_COMMUNITY): Payer: Self-pay | Admitting: Psychiatry

## 2014-08-26 ENCOUNTER — Ambulatory Visit (INDEPENDENT_AMBULATORY_CARE_PROVIDER_SITE_OTHER): Payer: BC Managed Care – PPO | Admitting: Psychiatry

## 2014-08-26 ENCOUNTER — Encounter (HOSPITAL_COMMUNITY): Payer: Self-pay | Admitting: Psychiatry

## 2014-08-26 VITALS — BP 133/78 | HR 81 | Ht 60.0 in | Wt 189.4 lb

## 2014-08-26 DIAGNOSIS — F4310 Post-traumatic stress disorder, unspecified: Secondary | ICD-10-CM

## 2014-08-26 DIAGNOSIS — F3162 Bipolar disorder, current episode mixed, moderate: Secondary | ICD-10-CM

## 2014-08-26 DIAGNOSIS — F39 Unspecified mood [affective] disorder: Secondary | ICD-10-CM

## 2014-08-26 MED ORDER — ALPRAZOLAM 1 MG PO TABS: ORAL_TABLET | ORAL | Status: DC

## 2014-08-26 MED ORDER — QUETIAPINE FUMARATE 50 MG PO TABS: ORAL_TABLET | ORAL | Status: DC

## 2014-08-26 MED ORDER — LITHIUM CARBONATE 300 MG PO TABS: ORAL_TABLET | ORAL | Status: DC

## 2014-08-26 MED ORDER — FLUOXETINE HCL 20 MG PO CAPS: 20.0000 mg | ORAL_CAPSULE | Freq: Every day | ORAL | Status: DC

## 2014-08-26 NOTE — Progress Notes (Signed)
Patient ID: Teresa Tran, female   DOB: [birth year], 33 y.o.   MRN: [id number] Patient ID: Teresa Tran, female   DOB: 30-May-1981, 33 y.o.   MRN: [id number] Patient ID: Teresa Tran, female   DOB: [birth year], 33 y.o.   MRN: [id number] Patient ID: Teresa Tran, female   DOB: [birth year], 33 y.o.   MRN: [id number] Patient ID: Teresa Tran, female   DOB: [birth year], 33 y.o.   MRN: [id number] Patient ID: Teresa Tran, female   DOB: [birth year], 33 y.o.   MRN: [id number] Patient ID: Teresa Tran, female   DOB: Apr 05, 1981, 33 y.o.   MRN: [id number] Patient ID: Teresa Tran, female   DOB: [birth year], 33 y.o.   MRN: [id number] Patient ID: Teresa Tran, female   DOB: 03/10/1981, 33 y.o.   MRN: [id number] Patient ID: Teresa Tran, female   DOB: August 30, 1981, 33 y.o.   MRN: [id number] Patient ID: Teresa Tran, female   DOB: 02/10/1981, 33 y.o.   MRN: [id number] Patient ID: Teresa Tran, female   DOB: 01/12/1981, 33 y.o.   MRN: [id number] [place] [address] Progress Note Teresa Tran MRN: [id number] DOB: [birth year] Age: 33 y.o.  Date: 08/26/2014  Chief Complaint: Chief Complaint  Patient presents with  . Anxiety  . Depression  . Manic Behavior  . Follow-up   Subjective: I' get overwhelmed easily"  This patient is a 33 year old married white female who lives with her husband and 3 children-2 daughters ages 45 and 45 and a boy age 62 in [place] she is unemployed and is applying for disability.  The patient returns after 2 months. She states that she staying very busy with her children's activities. Her mood has been okay but she still has a lot of racing thoughts. I reminded her that she's on 3 medications that help this. She is sleeping fairly well and has not had suicidal ideation or hallucinations. She states that she no longer wants to be on Brintellix because it causes nausea and she would like to go back to Prozac. I told her we could do this but she would have to switch gradually. She also wants to go off Seroquel because of  weight gain and lethargy. I suggested we wait another month as it is not good to go on and off too many medications at one time. Her lithium level is good at 0.8 .Marland Kitchen Past psychiatric history. Patient has history of significant depression and mood swings. She has at least 4 psychiatric admissions in past.  In 2000-2003 she was admitted due to cutting herself and suicidal thinking. In the past. She had tried Geodon, Depakote, Lamictal, Prozac, Abilify, Lexapro, Celexa, Effexor, Wellbutrin, BuSpar, Paxil, Risperdal, Ativan and Klonopin. She admitted history of anger, severe mood swings, and irritability.  Her last psychiatric admission was in June 2013.  Psychosocial history. Patient was born and lived in [place]. She's been married twice. She has one son from her previous marriage and a daughter from her second marriage. She used to live by herself however recently her husband move-in and she is working on her marriage.   Allergies: Allergies  Allergen Reactions  . Risperdal [Risperidone] Other (See Comments)    Wt gain  . Macrobid [Nitrofurantoin Monohyd Macro] Itching    Medical History: Past Medical History  Diagnosis Date  . Headache(784.0)   . Bipolar disorder   . Depression   . Ulcer   . Vaginal irritation 02/12/2014  . Yeast infection  02/12/2014  . Obesity   . Vaginal discharge 08/15/2014  . BV (bacterial vaginosis) 08/15/2014  . Low back pain 08/15/2014  Patient has obesity, and chronic migraine headache. . She is taking Imitrex as needed for headache.  Her blood work in March 2013 showed hemoglobin A1c is 6 however her liver enzymes and kidney functions are normal.  Her primary care physician is [facility].    Surgical History: Past Surgical History  Procedure Laterality Date  . Tubal ligation    . Cholecystectomy  Feb 2015    Dr. Ladona Horns, [place]  . Esophagogastroduodenoscopy (egd) with esophageal dilation N/A 02/11/2014    Dr. 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 Esophagogastroduodenoscopy N/A 06/11/2014    Procedure: ESOPHAGOGASTRODUODENOSCOPY (EGD);  Surgeon: Danie Binder, MD;  Location: [facility];  Service: Endoscopy;  Laterality: N/A;  230   Family history. Patient endorsed significant history of depression anxiety in her family. Her brother, sister and father has depression and anxiety. family history includes Anxiety disorder in her brother, father, and sister; Cancer in her paternal grandmother; Cancer - Other in her paternal grandfather; Depression in her brother, father, and sister; Diabetes in her mother; Kidney disease in her mother. There is no history of ADD / ADHD, Alcohol abuse, Drug abuse, Bipolar disorder, Dementia, OCD, Paranoid behavior, Schizophrenia, Seizures, Sexual abuse, Physical abuse, or Colon cancer. Reviewed all this again today in the session and nothing has changed.  Education background and work history. She has bachelor degree in Programmer, applications.  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 She quit her job in December 2012 .  She worked briefly at [employer] but quit her job due to feeling overwhelmed.  She recently worked at [employer] as a Scientist, water quality for 2 weeks only.    Alcohol and substance use history. Patient denies any history of alcohol or substance use. She denies a history of DWI.  She has used Xanax for two years and is not willing to stop this for Neurontin for her anxiety despite noting memory loss with the use of Xanax.  Filed Vitals:   08/26/14 0843  BP: 133/78  Pulse: 81    Mental status examination. Patient is a somewhat overweight white female, who is casually dressed.  She is anxious but cooperative.  She described her mood as anxious and her affect is congruent She maintained fair eye contact.  She denies any active or passive suicidal thoughts or homicidal thoughts.  She denies any auditory hallucination or visual  hallucination.  She denies paranoid ideation. She she claims that her thoughts are racing but her speech is clear coherent and logical There were no flight of ideas or loose association .  Her attention and concentration is fair.  She's alert and oriented x3. Her insight judgment and impulse control is fair.  Lab Results:  Results for orders placed in visit on 08/15/14 (from the past 8736 hour(s))  POCT URINALYSIS DIPSTICK   Collection Time    08/15/14 11:36 AM      Result Value Ref Range   Color, UA yellow     Clarity, UA       Glucose, UA neg     Bilirubin, UA       Ketones, UA       Spec Grav, UA       Blood, UA neg     pH, UA       Protein,  UA neg     Urobilinogen, UA       Nitrite, UA neg     Leukocytes, UA Negative    POCT WET PREP (WET MOUNT)   Collection Time    08/15/14 11:36 AM      Result Value Ref Range   Source Wet Prep POC       WBC, Wet Prep HPF POC pos     Bacteria Wet Prep HPF POC       BACTERIA WET PREP MORPHOLOGY POC       Clue Cells Wet Prep HPF POC Moderate     Clue Cells Wet Prep Whiff POC       Yeast Wet Prep HPF POC       KOH Wet Prep POC       Trichomonas Wet Prep HPF POC      URINE CULTURE   Collection Time    08/15/14 11:53 AM      Result Value Ref Range   Colony Count NO GROWTH     Organism ID, Bacteria NO GROWTH    URINALYSIS   Collection Time    08/15/14 11:53 AM      Result Value Ref Range   Color, Urine YELLOW  YELLOW   APPearance CLEAR  CLEAR   Specific Gravity, Urine 1.024  1.005 - 1.030   pH 5.0  5.0 - 8.0   Glucose, UA NEG  NEG mg/dL   Bilirubin Urine NEG  NEG   Ketones, ur NEG  NEG mg/dL   Hgb urine dipstick NEG  NEG   Protein, ur NEG  NEG mg/dL   Urobilinogen, UA 0.2  0.0 - 1.0 mg/dL   Nitrite NEG  NEG   Leukocytes, UA NEG  NEG  Results for orders placed in visit on 07/05/14 (from the past 8736 hour(s))  LITHIUM LEVEL   Collection Time    08/22/14  8:47 AM      Result Value Ref Range   Lithium Lvl 0.80  0.80 - 1.40  mEq/L  Results for orders placed in visit on 03/01/14 (from the past 8736 hour(s))  LITHIUM LEVEL   Collection Time    03/14/14  9:39 AM      Result Value Ref Range   Lithium Lvl 0.70 (*) 0.80 - 1.40 mEq/L  Results for orders placed in visit on 02/12/14 (from the past 8736 hour(s))  CBC   Collection Time    02/12/14 12:01 PM      Result Value Ref Range   WBC 6.7  4.0 - 10.5 K/uL   RBC 4.15  3.87 - 5.11 MIL/uL   Hemoglobin 11.4 (*) 12.0 - 15.0 g/dL   HCT 34.1 (*) 36.0 - 46.0 %   MCV 82.2  78.0 - 100.0 fL   MCH 27.5  26.0 - 34.0 pg   MCHC 33.4  30.0 - 36.0 g/dL   RDW 15.1  11.5 - 15.5 %   Platelets 283  150 - 400 K/uL  COMPREHENSIVE METABOLIC PANEL   Collection Time    02/12/14 12:01 PM      Result Value Ref Range   Sodium 140  135 - 145 mEq/L   Potassium 4.0  3.5 - 5.3 mEq/L   Chloride 102  96 - 112 mEq/L   CO2 27  19 - 32 mEq/L   Glucose, Bld 94  70 - 99 mg/dL   BUN 6  6 - 23 mg/dL   Creat 0.54  0.50 - 1.10 mg/dL  Total Bilirubin 0.2  0.2 - 1.2 mg/dL   Alkaline Phosphatase 75  39 - 117 U/L   AST 22  0 - 37 U/L   ALT 23  0 - 35 U/L   Total Protein 6.1  6.0 - 8.3 g/dL   Albumin 3.6  3.5 - 5.2 g/dL   Calcium 8.8  8.4 - 10.5 mg/dL  TSH   Collection Time    02/12/14 12:01 PM      Result Value Ref Range   TSH 3.067  0.350 - 4.500 uIU/mL  LIPID PANEL   Collection Time    02/12/14 12:01 PM      Result Value Ref Range   Cholesterol 180  0 - 200 mg/dL   Triglycerides 133  <150 mg/dL   HDL 53  >39 mg/dL   Total CHOL/HDL Ratio 3.4     VLDL 27  0 - 40 mg/dL   LDL Cholesterol 100 (*) 0 - 99 mg/dL  POCT WET PREP (WET MOUNT)   Collection Time    02/12/14 12:02 PM      Result Value Ref Range   Source Wet Prep POC       WBC, Wet Prep HPF POC few     Bacteria Wet Prep HPF POC       BACTERIA WET PREP MORPHOLOGY POC       Clue Cells Wet Prep HPF POC       Clue Cells Wet Prep Whiff POC       Yeast Wet Prep HPF POC Moderate     KOH Wet Prep POC       Trichomonas Wet Prep  HPF POC      Results for orders placed in visit on 02/04/14 (from the past 8736 hour(s))  LITHIUM LEVEL   Collection Time    02/26/14 12:01 AM      Result Value Ref Range   Lithium Lvl 1.60 (*) 0.80 - 1.40 mEq/L  Results for orders placed in visit on 01/09/14 (from the past 8736 hour(s))  POCT WET PREP WITH KOH   Collection Time    01/09/14  9:49 AM      Result Value Ref Range   Trichomonas, UA Negative     Clue Cells Wet Prep HPF POC Negative     Epithelial Wet Prep HPF POC Negative     Yeast Wet Prep HPF POC Positive     Bacteria Wet Prep HPF POC Negative     RBC Wet Prep HPF POC Negative     WBC Wet Prep HPF POC Positive     KOH Prep POC Positive    Results for orders placed in visit on 12/20/13 (from the past 8736 hour(s))  COMPREHENSIVE METABOLIC PANEL   Collection Time    12/20/13  3:37 PM      Result Value Ref Range   Sodium 140  135 - 145 mEq/L   Potassium 4.1  3.5 - 5.3 mEq/L   Chloride 106  96 - 112 mEq/L   CO2 27  19 - 32 mEq/L   Glucose, Bld 106 (*) 70 - 99 mg/dL   BUN 12  6 - 23 mg/dL   Creat 0.67  0.50 - 1.10 mg/dL   Total Bilirubin 0.2  0.2 - 1.2 mg/dL   Alkaline Phosphatase 90  39 - 117 U/L   AST 20  0 - 37 U/L   ALT 27  0 - 35 U/L   Total Protein 6.4  6.0 - 8.3  g/dL   Albumin 3.7  3.5 - 5.2 g/dL   Calcium 9.0  8.4 - 10.5 mg/dL  CBC   Collection Time    12/20/13  3:37 PM      Result Value Ref Range   WBC 8.1  4.0 - 10.5 K/uL   RBC 4.27  3.87 - 5.11 MIL/uL   Hemoglobin 12.3  12.0 - 15.0 g/dL   HCT 36.8  36.0 - 46.0 %   MCV 86.2  78.0 - 100.0 fL   MCH 28.8  26.0 - 34.0 pg   MCHC 33.4  30.0 - 36.0 g/dL   RDW 14.7  11.5 - 15.5 %   Platelets 300  150 - 400 K/uL  Results for orders placed in visit on 12/14/13 (from the past 8736 hour(s))  LITHIUM LEVEL   Collection Time    01/31/14  9:44 AM      Result Value Ref Range   Lithium Lvl 0.60 (*) 0.80 - 1.40 mEq/L  Results for orders placed in visit on 12/12/13 (from the past 8736 hour(s))  LITHIUM LEVEL    Collection Time    12/12/13 11:18 AM      Result Value Ref Range   Lithium Lvl 0.70 (*) 0.80 - 1.40 mEq/L   Assessment. Axis I mood disorder NOS, bipolar disorder 1, posttraumatic stress disorder, r/o Major depressive disorder Axis II rule out borderline personality trait. Axis III obesity, chronic headache. Axis IV moderate. Axis V 60-65.  Plan/Discussion: I review her history and previous notes.   She will start Prozac 20 mg daily and discontinue Brintellix after one week She'll continue  lithium carbonate  back to 900 mg each bedtime. Marland Kitchen She will continue   Xanax to 1 mg 3 times a day and 2 at bedtime  Seroquel be continued at 50 mg twice a day and 100 mg each bedtime.She will continue counseling here Time spent 25 minutes.  More than 50% of the time spent in psychoeducation, counseling and coordination of care. She will return in 4 weeks  MEDICATIONS this encounter: Meds ordered this encounter  Medications  . FLUoxetine (PROZAC) 20 MG capsule    Sig: Take 1 capsule (20 mg total) by mouth daily.    Dispense:  30 capsule    Refill:  2  . lithium 300 MG tablet    Sig: Take three at bedtime    Dispense:  90 tablet    Refill:  2  . QUEtiapine (SEROQUEL) 50 MG tablet    Sig: Take one tablet twice a day and 2 at bedtime    Dispense:  120 tablet    Refill:  2  . ALPRAZolam (XANAX) 1 MG tablet    Sig: Take one three times a day and two at bedtime    Dispense:  150 tablet    Refill:  2   Medical Decision Making Problem Points:  Established problem, worsening (2), New problem, with additional work-up planned (4), Review of last therapy session (1) and Review of psycho-social stressors (1) Data Points:  Review or order clinical lab tests (1) Review of medication regiment & side effects (2) Review of new medications or change in dosage (2)  I certify that outpatient services furnished can reasonably be expected to improve the patient's condition.   Levonne Spiller, MD

## 2014-08-30 ENCOUNTER — Ambulatory Visit (INDEPENDENT_AMBULATORY_CARE_PROVIDER_SITE_OTHER): Payer: BC Managed Care – PPO | Admitting: Adult Health

## 2014-08-30 ENCOUNTER — Encounter: Payer: Self-pay | Admitting: Adult Health

## 2014-08-30 VITALS — BP 120/76 | Ht 60.0 in | Wt 191.0 lb

## 2014-08-30 DIAGNOSIS — M545 Low back pain, unspecified: Secondary | ICD-10-CM

## 2014-08-30 DIAGNOSIS — R350 Frequency of micturition: Secondary | ICD-10-CM

## 2014-08-30 HISTORY — DX: Frequency of micturition: R35.0

## 2014-08-30 LAB — POCT URINALYSIS DIPSTICK
Blood, UA: NEGATIVE
Glucose, UA: NEGATIVE
Leukocytes, UA: NEGATIVE
Nitrite, UA: NEGATIVE
PROTEIN UA: NEGATIVE

## 2014-08-30 NOTE — Patient Instructions (Signed)
Push fluids Follow up in 2 weeks Renal US at 12N at [facility] Urinary Frequency The number of times a normal person urinates depends upon how much liquid they take in and how much liquid they are losing. If the temperature is hot and there is high humidity, then the person will sweat more and usually breathe a little more frequently. These factors decrease the amount of frequency of urination that would be considered normal. The amount you drink is easily determined, but the amount of fluid lost is sometimes more difficult to calculate.  Fluid is lost in two ways:  Sensible fluid loss is usually measured by the amount of urine that you get rid of. Losses of fluid can also occur with diarrhea.  Insensible fluid loss is more difficult to measure. It is caused by evaporation. Insensible loss of fluid occurs through breathing and sweating. It usually ranges from a little less than a quart to a little more than a quart of fluid a day. In normal temperatures and activity levels, the average person may urinate 4 to 7 times in a 24-hour period. Needing to urinate more often than that could indicate a problem. If one urinates 4 to 7 times in 24 hours and has large volumes each time, that could indicate a different problem from one who urinates 4 to 7 times a day and has small volumes. The time of urinating is also important. Most urinating should be done during the waking hours. Getting up at night to urinate frequently can indicate some problems. CAUSES  The bladder is the organ in your lower abdomen that holds urine. Like a balloon, it swells some as it fills up. Your nerves sense this and tell you it is time to head for the bathroom. There are a number of reasons that you might feel the need to urinate more often than usual. They include:  Urinary tract infection. This is usually associated with other signs such as burning when you urinate.  In men, problems with the prostate (a walnut-size gland that is  located near the tube that carries urine out of your body). There are two reasons why the prostate can cause an increased frequency of urination:  An enlarged prostate that does not let the bladder empty well. If the bladder only half empties when you urinate, then it only has half the capacity to fill before you have to urinate again.  The nerves in the bladder become more hypersensitive with an increased size of the prostate even if the bladder empties completely.  Pregnancy.  Obesity. Excess weight is more likely to cause a problem for women than for men.  Bladder stones or other bladder problems.  Caffeine.  Alcohol.  Medications. For example, drugs that help the body get rid of extra fluid (diuretics) increase urine production. Some other medicines must be taken with lots of fluids.  Muscle or nerve weakness. This might be the result of a spinal cord injury, a stroke, multiple sclerosis, or Parkinson disease.  Long-standing diabetes can decrease the sensation of the bladder. This loss of sensation makes it harder to sense the bladder needs to be emptied. Over a period of years, the bladder is stretched out by constant overfilling. This weakens the bladder muscles so that the bladder does not empty well and has less capacity to fill with new urine.  Interstitial cystitis (also called painful bladder syndrome). This condition develops because the tissues that line the inside of the bladder are inflamed (inflammation is the  body's way of reacting to injury or infection). It causes pain and frequent urination. It occurs in women more often than in men. DIAGNOSIS   To decide what might be causing your urinary frequency, your health care provider will probably:  Ask about symptoms you have noticed.  Ask about your overall health. This will include questions about any medications you are taking.  Do a physical examination.  Order some tests. These might include:  A blood test to  check for diabetes or other health issues that could be contributing to the problem.  Urine testing. This could measure the flow of urine and the pressure on the bladder.  A test of your neurological system (the brain, spinal cord, and nerves). This is the system that senses the need to urinate.  A bladder test to check whether it is emptying completely when you urinate.  Cystoscopy. This test uses a thin tube with a tiny camera on it. It offers a look inside your urethra and bladder to see if there are problems.  Imaging tests. You might be given a contrast dye and then asked to urinate. X-rays are taken to see how your bladder is working. TREATMENT  It is important for you to be evaluated to determine if the amount or frequency that you have is unusual or abnormal. If it is found to be abnormal, the cause should be determined and this can usually be found out easily. Depending upon the cause, treatment could include medication, stimulation of the nerves, or surgery. There are not too many things that you can do as an individual to change your urinary frequency. It is important that you balance the amount of fluid intake needed to compensate for your activity and the temperature. Medical problems will be diagnosed and taken care of by your physician. There is no particular bladder training such as Kegel exercises that you can do to help urinary frequency. This is an exercise that is usually recommended for people who have leaking of urine when they laugh, cough, or sneeze. HOME CARE INSTRUCTIONS   Take any medications your health care provider prescribed or suggested. Follow the directions carefully.  Practice any lifestyle changes that are recommended. These might include:  Drinking less fluid or drinking at different times of the day. If you need to urinate often during the night, for example, you may need to stop drinking fluids early in the evening.  Cutting down on caffeine or alcohol.  They both can make you need to urinate more often than normal. Caffeine is found in coffee, tea, and sodas.  Losing weight, if that is recommended.  Keep a journal or a log. You might be asked to record how much you drink and when and where you feel the need to urinate. This will also help evaluate how well the treatment provided by your physician is working. SEEK MEDICAL CARE IF:   Your need to urinate often gets worse.  You feel increased pain or irritation when you urinate.  You notice blood in your urine.  You have questions about any medications that your health care provider recommended.  You notice blood, pus, or swelling at the site of any test or treatment procedure.  You develop a fever of more than 100.67F (38.1C). SEEK IMMEDIATE MEDICAL CARE IF:  You develop a fever of more than 102.76F (38.9C). Document Released: 08/21/2009 Document Revised: 03/11/2014 Document Reviewed: 08/21/2009 [facility] Patient Information 2015 [place], [place]. This information is not intended to replace advice given to  you by your health care provider. Make sure you discuss any questions you have with your health care provider.  [facility]

## 2014-08-30 NOTE — Progress Notes (Signed)
Subjective:     Patient ID: Teresa Tran, female   DOB: 1981-07-26, 33 y.o.   MRN: [id number]  HPI Teresa Tran is a 33 year old white female in with urinary frequency and low back pain.Was treated with flagyl for BV and flexeril and ice and stretches for low back, feels a little better.Now says kidney area hurts.  Review of Systems See HPI Reviewed past medical,surgical, social and family history. Reviewed medications and allergies.     Objective:   Physical Exam BP 120/76  Ht 5' (1.524 m)  Wt 191 lb (86.637 kg)  BMI 37.30 kg/m2  LMP 07/30/2014   urine negative, but cloudy, +bilateral CVAT and some low back pain  Assessment:     Urinary frequency  Low back and +CVAT    Plan:    Push fluids UA C&S sent Renal US 10/28 at 12N at [facility] to assess kidneys if negative, may refer to chiropractor  Follow up in 2 weeks   Review handout on urinary frequency

## 2014-08-31 LAB — URINALYSIS
BILIRUBIN URINE: NEGATIVE
Glucose, UA: NEGATIVE mg/dL
Hgb urine dipstick: NEGATIVE
KETONES UR: NEGATIVE mg/dL
Leukocytes, UA: NEGATIVE
Nitrite: NEGATIVE
PROTEIN: NEGATIVE mg/dL
Specific Gravity, Urine: 1.023 (ref 1.005–1.030)
UROBILINOGEN UA: 0.2 mg/dL (ref 0.0–1.0)
pH: 6 (ref 5.0–8.0)

## 2014-08-31 LAB — URINE CULTURE

## 2014-09-04 ENCOUNTER — Ambulatory Visit (HOSPITAL_COMMUNITY)
Admission: RE | Admit: 2014-09-04 | Discharge: 2014-09-04 | Disposition: A | Discharge status: Home / Self Care | Payer: BC Managed Care – PPO | Source: Ambulatory Visit | Attending: Adult Health | Admitting: Adult Health

## 2014-09-04 DIAGNOSIS — M545 Low back pain, unspecified: Secondary | ICD-10-CM

## 2014-09-04 DIAGNOSIS — R350 Frequency of micturition: Secondary | ICD-10-CM | POA: Diagnosis not present

## 2014-09-05 ENCOUNTER — Telehealth: Payer: Self-pay | Admitting: Adult Health

## 2014-09-05 NOTE — Telephone Encounter (Signed)
Left message renal US normal

## 2014-09-06 NOTE — Telephone Encounter (Signed)
Left message letting pt know urine culture didn't grow out anything. Advised to call with further questions. [place]

## 2014-09-11 ENCOUNTER — Ambulatory Visit (INDEPENDENT_AMBULATORY_CARE_PROVIDER_SITE_OTHER): Payer: BC Managed Care – PPO | Admitting: Gastroenterology

## 2014-09-11 ENCOUNTER — Ambulatory Visit: Payer: BC Managed Care – PPO | Admitting: Adult Health

## 2014-09-11 ENCOUNTER — Encounter: Payer: Self-pay | Admitting: Gastroenterology

## 2014-09-11 VITALS — BP 119/84 | HR 68 | Temp 97.4°F | Ht 60.0 in | Wt 188.0 lb

## 2014-09-11 DIAGNOSIS — K257 Chronic gastric ulcer without hemorrhage or perforation: Secondary | ICD-10-CM

## 2014-09-11 DIAGNOSIS — D649 Anemia, unspecified: Secondary | ICD-10-CM | POA: Insufficient documentation

## 2014-09-11 DIAGNOSIS — R1310 Dysphagia, unspecified: Secondary | ICD-10-CM

## 2014-09-11 NOTE — Patient Instructions (Signed)
1. Please have your labs done. 2. Upper endoscopy in near future with Dr. Oneida Alar. See separate instructions.

## 2014-09-11 NOTE — Progress Notes (Signed)
Primary Care Physician:  Delphina Cahill, MD  Primary Gastroenterologist:  Barney Drain, MD   Chief Complaint  Patient presents with  . Follow-up    HPI:  Teresa Tran is a 33 y.o. female here for follow up of partially healed gastric ulcers. PUD initially noted in 02/2014. F/U EGD 06/2014 showed partially healed gastric ulcers, moderate gastritis, mild duodenitis. Biopsies benign. Previously felt to have been related to NSAIDS.  Uses ibuprofen 800 mg about once per week. Complains of dysphagia to bread.  800mg  swallowing issues with bread again. Recurrent. Esophageal web dilated at time of 02/2014 EGD. Denies constipation, diarrhea, melena, brbpr. No heartburn, abd pain, vomiting.    Current Outpatient Prescriptions  Medication Sig Dispense Refill  . ALPRAZolam (XANAX) 1 MG tablet Take one three times a day and two at bedtime 150 tablet 2  . cyclobenzaprine (FLEXERIL) 10 MG tablet Take 1 tablet (10 mg total) by mouth every 8 (eight) hours as needed for muscle spasms. 30 tablet 0  . FLUoxetine (PROZAC) 20 MG capsule Take 1 capsule (20 mg total) by mouth daily. 30 capsule 2  . lithium 300 MG tablet Take three at bedtime 90 tablet 2  . omeprazole (PRILOSEC) 20 MG capsule Take 1 capsule (20 mg total) by mouth 2 (two) times daily before a meal. 60 capsule 5   No current facility-administered medications for this visit.    Allergies as of 09/11/2014 - Review Complete 09/11/2014  Allergen Reaction Noted  . Risperdal [risperidone] Other (See Comments) 10/11/2012  . Macrobid [nitrofurantoin monohyd macro] Itching 01/09/2014    Past Medical History  Diagnosis Date  . Headache(784.0)   . Bipolar disorder   . Depression   . Ulcer   . Vaginal irritation 02/12/2014  . Yeast infection 02/12/2014  . Obesity   . Vaginal discharge 08/15/2014  . BV (bacterial vaginosis) 08/15/2014  . Low back pain 08/15/2014  . Urinary frequency 08/30/2014    Past Surgical History  Procedure Laterality Date  . Tubal  ligation    . Cholecystectomy  Feb 2015    Dr. Ladona Horns, [place]  . Esophagogastroduodenoscopy (egd) with esophageal dilation N/A 02/11/2014    Dr. 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 Esophagogastroduodenoscopy N/A 06/11/2014    OEH:OZYY DUODENITIS/MODERATE EROSIVE GASTRICTIS IN ANTRUM/PARTIALLY HEALED ULCERS    Family History  Problem Relation Age of Onset  . Depression Father   . Anxiety disorder Father   . Depression Sister   . Anxiety disorder Sister   . Depression Brother   . Anxiety disorder Brother   . ADD / ADHD Neg Hx   . Alcohol abuse Neg Hx   . Drug abuse Neg Hx   . Bipolar disorder Neg Hx   . Dementia Neg Hx   . OCD Neg Hx   . Paranoid behavior Neg Hx   . Schizophrenia Neg Hx   . Seizures Neg Hx   . Sexual abuse Neg Hx   . Physical abuse Neg Hx   . Colon cancer Neg Hx   . Cancer - Other Paternal Grandfather     prostate  . Kidney disease Mother   . Diabetes Mother   . Cancer Paternal Grandmother     bladder    History   Social History  . Marital Status: Married    Spouse Name: N/A    Number of Children: N/A  . Years of Education: N/A   Occupational History  . Not on file.  Social History Main Topics  . Smoking status: Never Smoker   . Smokeless tobacco: Never Used     Comment: NEVER SMOKED  . Alcohol Use: No  . Drug Use: No  . Sexual Activity: Yes    Birth Control/ Protection: Surgical     Comment: tubal   Other Topics Concern  . Not on file   Social History Narrative      ROS:  General: Negative for anorexia, weight loss, fever, chills, fatigue, weakness. Eyes: Negative for vision changes.  ENT: Negative for hoarseness, difficulty swallowing , nasal congestion. CV: Negative for chest pain, angina, palpitations, dyspnea on exertion, peripheral edema.  Respiratory: Negative for dyspnea at rest, dyspnea on exertion, cough, sputum, wheezing.  GI: See history of present illness. GU:  Negative for  dysuria, hematuria, urinary incontinence, urinary frequency, nocturnal urination.  MS: Negative for joint pain, low back pain.  Derm: Negative for rash or itching.  Neuro: Negative for weakness, abnormal sensation, seizure, frequent headaches, memory loss, confusion.  Psych: Negative for anxiety, depression, suicidal ideation, hallucinations.  Endo: Negative for unusual weight change.  Heme: Negative for bruising or bleeding. Allergy: Negative for rash or hives.    Physical Examination:  BP 119/84 mmHg  Pulse 68  Temp(Src) 97.4 F (36.3 C) (Oral)  Ht 5' (1.524 m)  Wt 188 lb (85.276 kg)  BMI 36.72 kg/m2  LMP 07/30/2014   General: Well-nourished, well-developed in no acute distress.  Head: Normocephalic, atraumatic.   Eyes: Conjunctiva pink, no icterus. Mouth: Oropharyngeal mucosa moist and pink , no lesions erythema or exudate. Neck: Supple without thyromegaly, masses, or lymphadenopathy.  Lungs: Clear to auscultation bilaterally.  Heart: Regular rate and rhythm, no murmurs rubs or gallops.  Abdomen: Bowel sounds are normal, nontender, nondistended, no hepatosplenomegaly or masses, no abdominal bruits or    hernia , no rebound or guarding.   Rectal: not performed Extremities: No lower extremity edema. No clubbing or deformities.  Neuro: Alert and oriented x 4 , grossly normal neurologically.  Skin: Warm and dry, no rash or jaundice.   Psych: Alert and cooperative, normal mood and affect.  Labs: Lab Results  Component Value Date   WBC 6.7 02/12/2014   HGB 11.4* 02/12/2014   HCT 34.1* 02/12/2014   MCV 82.2 02/12/2014   PLT 283 02/12/2014     Imaging Studies: US Renal  09/04/2014   CLINICAL DATA:  CVA tenderness, urinary frequency  EXAM: RENAL/URINARY TRACT ULTRASOUND COMPLETE  COMPARISON:  Abdominal ultrasound dated 12/24/2013  FINDINGS: Right Kidney:  Length: 11.0 cm.  No mass or hydronephrosis.  Left Kidney:  Length: 11.5 cm.  No mass or hydronephrosis.  Bladder:   Within normal limits.  IMPRESSION: Negative renal ultrasound.   Electronically Signed   By: Julian Hy M.D.   On: 09/04/2014 13:09

## 2014-09-12 ENCOUNTER — Other Ambulatory Visit: Payer: Self-pay

## 2014-09-12 DIAGNOSIS — K259 Gastric ulcer, unspecified as acute or chronic, without hemorrhage or perforation: Secondary | ICD-10-CM

## 2014-09-12 DIAGNOSIS — D649 Anemia, unspecified: Secondary | ICD-10-CM

## 2014-09-12 DIAGNOSIS — R1314 Dysphagia, pharyngoesophageal phase: Secondary | ICD-10-CM

## 2014-09-13 ENCOUNTER — Ambulatory Visit: Payer: BC Managed Care – PPO | Admitting: Adult Health

## 2014-09-13 NOTE — Assessment & Plan Note (Signed)
H/O gastric ulcers on last two EGDs, although partial healing noted in 06/2014. She has less UGI symptoms but complains of recurrent dysphagia. H/o normocytic anemia, will recheck at this time. EGD in near future for dysphagia and follow up on gastric ulcers.  I have discussed the risks, alternatives, benefits with regards to but not limited to the risk of reaction to medication, bleeding, infection, perforation and the patient is agreeable to proceed. Written consent to be obtained.

## 2014-09-16 NOTE — Progress Notes (Signed)
cc'ed to pcp °

## 2014-09-17 ENCOUNTER — Ambulatory Visit: Payer: BC Managed Care – PPO | Admitting: Neurology

## 2014-09-19 ENCOUNTER — Ambulatory Visit: Payer: BC Managed Care – PPO | Admitting: Neurology

## 2014-09-20 ENCOUNTER — Encounter: Payer: Self-pay | Admitting: Neurology

## 2014-09-20 ENCOUNTER — Encounter (HOSPITAL_COMMUNITY): Payer: Self-pay | Admitting: Psychiatry

## 2014-09-20 ENCOUNTER — Ambulatory Visit (HOSPITAL_COMMUNITY): Payer: Self-pay | Admitting: Psychiatry

## 2014-09-30 ENCOUNTER — Ambulatory Visit: Payer: Self-pay | Admitting: Nutrition

## 2014-10-10 ENCOUNTER — Telehealth: Payer: Self-pay | Admitting: Adult Health

## 2014-10-10 MED ORDER — FLUCONAZOLE 150 MG PO TABS
ORAL_TABLET | ORAL | Status: DC
Start: 2014-10-10 — End: 2015-01-14

## 2014-10-10 NOTE — Telephone Encounter (Signed)
Complains of yeast infection on antibiotics will rx diflucan, if not better make appt

## 2014-10-16 ENCOUNTER — Telehealth (HOSPITAL_COMMUNITY): Payer: Self-pay | Admitting: *Deleted

## 2014-10-16 ENCOUNTER — Ambulatory Visit (HOSPITAL_COMMUNITY): Payer: Self-pay | Admitting: Psychiatry

## 2014-10-16 NOTE — Telephone Encounter (Signed)
Pt called and lm on office voicemail at 4:45pm 10-15-14 stating she will not be able to come to appt and did not indicate why she was cancelling. Called pt 10-16-14 at 10:03am

## 2014-10-17 ENCOUNTER — Ambulatory Visit: Payer: Self-pay | Admitting: Nutrition

## 2014-11-04 ENCOUNTER — Telehealth: Payer: Self-pay

## 2014-11-04 NOTE — Telephone Encounter (Signed)
Mailed letter to contact our office

## 2014-11-04 NOTE — Telephone Encounter (Signed)
Patient on recall list for 6 month ultrasound

## 2014-11-06 NOTE — Progress Notes (Signed)
REVIEWED-NO ADDITIONAL RECOMMENDATIONS. 

## 2014-11-11 ENCOUNTER — Encounter (HOSPITAL_COMMUNITY): Admission: RE | Payer: Self-pay | Source: Ambulatory Visit

## 2014-11-11 ENCOUNTER — Ambulatory Visit (HOSPITAL_COMMUNITY)
Admission: RE | Admit: 2014-11-11 | Payer: BC Managed Care – PPO | Source: Ambulatory Visit | Admitting: Gastroenterology

## 2014-11-11 SURGERY — EGD (ESOPHAGOGASTRODUODENOSCOPY)
Anesthesia: Moderate Sedation

## 2014-11-14 ENCOUNTER — Encounter: Payer: BC Managed Care – PPO | Attending: Gastroenterology | Admitting: Nutrition

## 2014-11-14 DIAGNOSIS — E669 Obesity, unspecified: Secondary | ICD-10-CM | POA: Insufficient documentation

## 2014-11-14 DIAGNOSIS — R635 Abnormal weight gain: Secondary | ICD-10-CM | POA: Insufficient documentation

## 2014-11-14 DIAGNOSIS — Z6836 Body mass index (BMI) 36.0-36.9, adult: Secondary | ICD-10-CM | POA: Insufficient documentation

## 2014-11-14 DIAGNOSIS — Z713 Dietary counseling and surveillance: Secondary | ICD-10-CM | POA: Insufficient documentation

## 2014-11-15 ENCOUNTER — Telehealth: Payer: Self-pay | Admitting: Gastroenterology

## 2014-11-15 NOTE — Telephone Encounter (Signed)
VM not set up, could not leave a message for the pt.

## 2014-11-15 NOTE — Telephone Encounter (Signed)
Patient wanted to speak with nurse about a problem she noticed about 2 weeks ago. She said after she eats that her back will start hurting and she doesn't have a gallbladder anymore and could it be something else causing her to have pain. She said that it's getting worse from the time she first noticed it. Please advise and call her at [phone]

## 2014-11-15 NOTE — Telephone Encounter (Signed)
Why did she not have her labs or EGD done? Did she cancel or [name]? At this point I would advise CBC, CMET. She is due for her abd u/s to follow up on liver lesion.  Return OV with [name].

## 2014-11-15 NOTE — Telephone Encounter (Signed)
I called pt and she said she has been having the pain in her back after she eats for a few weeks. This week it has gotten a little worse, and the pain lingers, although it is more intense at times.  She has nausea consistently but no vomiting. No abdominal pain, just crampy feeling.  She is aware it might be Monday before she gets a reply back.

## 2014-11-18 ENCOUNTER — Encounter: Payer: Self-pay | Admitting: Gastroenterology

## 2014-11-18 ENCOUNTER — Other Ambulatory Visit: Payer: Self-pay

## 2014-11-18 DIAGNOSIS — R109 Unspecified abdominal pain: Secondary | ICD-10-CM

## 2014-11-18 DIAGNOSIS — R1011 Right upper quadrant pain: Secondary | ICD-10-CM

## 2014-11-18 DIAGNOSIS — R110 Nausea: Secondary | ICD-10-CM

## 2014-11-18 DIAGNOSIS — M545 Low back pain: Secondary | ICD-10-CM

## 2014-11-18 NOTE — Telephone Encounter (Signed)
Pt is aware. Lab orders were faxed to [facility]. Will route to [facility] to schedule [name] and to [facility] to schedule OV.  She did say she had some things going on is the reason she cancelled the EGD and labs.  And then she was changing insurance. She said [facility] told her that she would need to be referred to [name] and Dr. Wende Neighbors should be faxing over that referral.

## 2014-11-18 NOTE — Telephone Encounter (Signed)
Pt is scheduled for [name] on 11/21/2014 @ 10:00am @ [facility]. Pt is aware.

## 2014-11-18 NOTE — Telephone Encounter (Signed)
APPOINTMENT MADE AND PATIENT AWARE. °

## 2014-11-21 ENCOUNTER — Ambulatory Visit (HOSPITAL_COMMUNITY)
Admission: RE | Admit: 2014-11-21 | Discharge: 2014-11-21 | Disposition: A | Discharge status: Home / Self Care | Payer: 59 | Source: Ambulatory Visit | Attending: Gastroenterology | Admitting: Gastroenterology

## 2014-11-21 DIAGNOSIS — M545 Low back pain: Secondary | ICD-10-CM | POA: Diagnosis not present

## 2014-11-21 DIAGNOSIS — R110 Nausea: Secondary | ICD-10-CM | POA: Insufficient documentation

## 2014-11-21 DIAGNOSIS — R1011 Right upper quadrant pain: Secondary | ICD-10-CM

## 2014-11-21 DIAGNOSIS — Z9049 Acquired absence of other specified parts of digestive tract: Secondary | ICD-10-CM | POA: Insufficient documentation

## 2014-11-22 LAB — COMPREHENSIVE METABOLIC PANEL
ALT: 16 U/L (ref 0–35)
AST: 17 U/L (ref 0–37)
Albumin: 3.9 g/dL (ref 3.5–5.2)
Alkaline Phosphatase: 66 U/L (ref 39–117)
BILIRUBIN TOTAL: 0.4 mg/dL (ref 0.2–1.2)
BUN: 12 mg/dL (ref 6–23)
CALCIUM: 9.4 mg/dL (ref 8.4–10.5)
CHLORIDE: 105 meq/L (ref 96–112)
CO2: 28 mEq/L (ref 19–32)
Creat: 0.58 mg/dL (ref 0.50–1.10)
GLUCOSE: 93 mg/dL (ref 70–99)
Potassium: 4.3 mEq/L (ref 3.5–5.3)
SODIUM: 140 meq/L (ref 135–145)
TOTAL PROTEIN: 6.7 g/dL (ref 6.0–8.3)

## 2014-11-22 LAB — CBC WITH DIFFERENTIAL/PLATELET
BASOS ABS: 0 10*3/uL (ref 0.0–0.1)
Basophils Relative: 0 % (ref 0–1)
EOS ABS: 0.1 10*3/uL (ref 0.0–0.7)
Eosinophils Relative: 1 % (ref 0–5)
HEMATOCRIT: 37.9 % (ref 36.0–46.0)
Hemoglobin: 12.6 g/dL (ref 12.0–15.0)
LYMPHS ABS: 1.8 10*3/uL (ref 0.7–4.0)
Lymphocytes Relative: 31 % (ref 12–46)
MCH: 28 pg (ref 26.0–34.0)
MCHC: 33.2 g/dL (ref 30.0–36.0)
MCV: 84.2 fL (ref 78.0–100.0)
MPV: 10.5 fL (ref 8.6–12.4)
Monocytes Absolute: 0.3 10*3/uL (ref 0.1–1.0)
Monocytes Relative: 5 % (ref 3–12)
NEUTROS PCT: 63 % (ref 43–77)
Neutro Abs: 3.7 10*3/uL (ref 1.7–7.7)
Platelets: 231 10*3/uL (ref 150–400)
RBC: 4.5 MIL/uL (ref 3.87–5.11)
RDW: 14.1 % (ref 11.5–15.5)
WBC: 5.9 10*3/uL (ref 4.0–10.5)

## 2014-11-22 NOTE — Progress Notes (Signed)
Quick Note:  Please let patient know her labs are normal. It appears that she did not have her EGD done. Please ask if she plans on rescheduling. If she is unsure, we could consider offering her a follow-up office visit since last sounds in November. ______

## 2014-11-22 NOTE — Progress Notes (Signed)
Quick Note:  9 mm incidental right lobe of the liver hemangioma. Likely does not need any further workup. Will discuss with radiology upon my return. ______

## 2014-11-25 NOTE — Progress Notes (Signed)
Quick Note:  Pt is aware of results. She had to wait on other insurance before scheduling EGD. She has already made OV appt for 12/16/2014 at 9:00 AM with Neil Crouch, PA.  She would like to discuss the hemangioma when you discuss with radiologist upon your return. ______

## 2014-11-25 NOTE — Progress Notes (Signed)
Quick Note:  Pt is aware and would like to discuss when you talk to the radiologist. ______

## 2014-12-16 ENCOUNTER — Ambulatory Visit: Payer: BLUE CROSS/BLUE SHIELD | Admitting: Gastroenterology

## 2014-12-20 ENCOUNTER — Ambulatory Visit (INDEPENDENT_AMBULATORY_CARE_PROVIDER_SITE_OTHER): Payer: 59 | Admitting: Psychiatry

## 2014-12-20 ENCOUNTER — Encounter (HOSPITAL_COMMUNITY): Payer: Self-pay | Admitting: Psychiatry

## 2014-12-20 VITALS — BP 132/72 | HR 81 | Ht 60.0 in | Wt 185.0 lb

## 2014-12-20 DIAGNOSIS — F3162 Bipolar disorder, current episode mixed, moderate: Secondary | ICD-10-CM

## 2014-12-20 DIAGNOSIS — F39 Unspecified mood [affective] disorder: Secondary | ICD-10-CM

## 2014-12-20 DIAGNOSIS — F4310 Post-traumatic stress disorder, unspecified: Secondary | ICD-10-CM

## 2014-12-20 MED ORDER — ASENAPINE MALEATE 5 MG SL SUBL: 5.0000 mg | SUBLINGUAL_TABLET | Freq: Two times a day (BID) | SUBLINGUAL | Status: DC

## 2014-12-20 MED ORDER — ALPRAZOLAM ER 0.5 MG PO TB24: 0.5000 mg | ORAL_TABLET | Freq: Two times a day (BID) | ORAL | Status: DC

## 2014-12-20 NOTE — Progress Notes (Signed)
Patient ID: Teresa Tran, female   DOB: 29-Nov-1980, 34 y.o.   MRN: [id number] Patient ID: Teresa Tran, female   DOB: [birth year], 34 y.o.   MRN: [id number] Patient ID: Teresa Tran, female   DOB: [birth year], 34 y.o.   MRN: [id number] Patient ID: Teresa Tran, female   DOB: [birth year], 34 y.o.   MRN: [id number] Patient ID: Teresa Tran, female   DOB: July 10, 1981, 34 y.o.   MRN: [id number] Patient ID: Teresa Tran, female   DOB: February 28, 1981, 34 y.o.   MRN: [id number] Patient ID: Teresa Tran, female   DOB: 25-Aug-1981, 34 y.o.   MRN: [id number] Patient ID: Teresa Tran, female   DOB: 03/12/1981, 34 y.o.   MRN: [id number] Patient ID: Teresa Tran, female   DOB: 07-25-1981, 34 y.o.   MRN: [id number] Patient ID: Teresa Tran, female   DOB: [birth year], 34 y.o.   MRN: [id number] Patient ID: Teresa Tran, female   DOB: 1981-07-29, 34 y.o.   MRN: [id number] Patient ID: Teresa Tran, female   DOB: February 11, 1981, 34 y.o.   MRN: [id number] Patient ID: Teresa Tran, female   DOB: Apr 18, 1981, 34 y.o.   MRN: [id number] [place] [id number] Progress Note Teresa Tran MRN: [id number] DOB: May 11, 1981 Age: 34 y.o.  Date: 12/20/2014  Chief Complaint: Chief Complaint  Patient presents with  . Manic Behavior  . Anxiety  . Depression  . 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  The patient returns after 4 months. Since I last saw her in October she decided to go off her psychiatric medications. She's not taking anything. She got very involved in church is leaving a children's church there and also got a part-time job. In some way she feels like the structures helped a lot. In other ways however she feels overwhelmed and very anxious. She doesn't want to rely on this much medicine she took before but she does want to go back on  some amount of Xanax for anxiety. She's not depressed or suicidal and has no thoughts of cutting herself but she has irritability and anxiety through the day and I suggested we try another mood stabilizer such as Saphris and she agrees .Marland Kitchen Past psychiatric history. Patient has history of significant depression and mood swings. She has at least 4 psychiatric admissions in past.  In 2000-2003 she was admitted due to cutting herself and suicidal thinking. In the past. She had tried Geodon, Depakote, Lamictal, Prozac, Abilify, Lexapro, Celexa, Effexor, Wellbutrin, BuSpar, Paxil, Risperdal, Ativan and Klonopin. She admitted history of anger, severe mood swings, and irritability.  Her last psychiatric admission was in June 2013.  Psychosocial history. Patient was born and lived in [place]. She's been married twice. She has one son from her previous marriage and a daughter from her second marriage.    Allergies: Allergies  Allergen Reactions  . Risperdal [Risperidone] Other (See Comments)    Wt gain  . Macrobid [Nitrofurantoin Monohyd Macro] Itching    Medical History: Past Medical History  Diagnosis Date  . Headache(784.0)   . Bipolar disorder   . Depression   . Ulcer   . Vaginal irritation 02/12/2014  . Yeast infection 02/12/2014  . Obesity   .  Vaginal discharge 08/15/2014  . BV (bacterial vaginosis) 08/15/2014  . Low back pain 08/15/2014  . Urinary frequency 08/30/2014  Patient has obesity, and chronic migraine headache. . She is taking Imitrex as needed for headache.  Her blood work in March 2013 showed hemoglobin A1c is 6 however her liver enzymes and kidney functions are normal.  Her primary care physician is [facility] internal medicine.    Surgical History: Past Surgical History  Procedure Laterality Date  . Tubal ligation    . Cholecystectomy  Feb 2015    Dr. Ladona Horns, [place]  . Esophagogastroduodenoscopy (egd) with esophageal dilation N/A 02/11/2014    Dr. 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 Esophagogastroduodenoscopy N/A 06/11/2014    HAL:PFXT DUODENITIS/MODERATE EROSIVE GASTRICTIS IN ANTRUM/PARTIALLY HEALED ULCERS   Family history. Patient endorsed significant history of depression anxiety in her family. Her brother, sister and father has depression and anxiety. family history includes Anxiety disorder in her brother, father, and sister; Cancer in her paternal grandmother; Cancer - Other in her paternal grandfather; Depression in her brother, father, and sister; Diabetes in her mother; Kidney disease in her mother. There is no history of ADD / ADHD, Alcohol abuse, Drug abuse, Bipolar disorder, Dementia, OCD, Paranoid behavior, Schizophrenia, Seizures, Sexual abuse, Physical abuse, or Colon cancer. Reviewed all this again today in the session and nothing has changed.  Education background and work history. She has bachelor degree in Programmer, applications.  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 She quit her job in December 2012 .  She worked briefly at [employer] but quit her job due to feeling overwhelmed.  She recently worked at [employer] as a Scientist, water quality for 2 weeks only.    Alcohol and substance use history. Patient denies any history of alcohol or substance use. She denies a history of DWI.  She has used Xanax for two years and is not willing to stop this for Neurontin for her anxiety despite noting memory loss with the use of Xanax.  Filed Vitals:   12/20/14 1427  BP: 132/72  Pulse: 81    Mental status examination. Patient is a somewhat overweight white female, who is nicely dressed She is anxious but cooperative.  She described her mood as anxious and her affect is congruent She maintained fair eye contact.  She denies any active or passive suicidal thoughts or homicidal thoughts.  She denies any auditory hallucination or visual hallucination.  She denies paranoid ideation. She she claims that  her thoughts are racing but her speech is clear coherent and logical There were no flight of ideas or loose association .  Her attention and concentration is fair.  She's alert and oriented x3. Her insight judgment and impulse control is fair.  Lab Results:  Results for orders placed or performed in visit on 11/18/14 (from the past 8736 hour(s))  CBC w/Diff   Collection Time: 11/21/14 11:42 AM  Result Value Ref Range   WBC 5.9 4.0 - 10.5 K/uL   RBC 4.50 3.87 - 5.11 MIL/uL   Hemoglobin 12.6 12.0 - 15.0 g/dL   HCT 37.9 36.0 - 46.0 %   MCV 84.2 78.0 - 100.0 fL   MCH 28.0 26.0 - 34.0 pg   MCHC 33.2 30.0 - 36.0 g/dL   RDW 14.1 11.5 - 15.5 %   Platelets 231 150 - 400 K/uL   MPV 10.5 8.6 - 12.4 fL   Neutrophils Relative % 63 43 -  77 %   Neutro Abs 3.7 1.7 - 7.7 K/uL   Lymphocytes Relative 31 12 - 46 %   Lymphs Abs 1.8 0.7 - 4.0 K/uL   Monocytes Relative 5 3 - 12 %   Monocytes Absolute 0.3 0.1 - 1.0 K/uL   Eosinophils Relative 1 0 - 5 %   Eosinophils Absolute 0.1 0.0 - 0.7 K/uL   Basophils Relative 0 0 - 1 %   Basophils Absolute 0.0 0.0 - 0.1 K/uL   Smear Review Criteria for review not met   Comp Met (CMET)   Collection Time: 11/21/14 11:42 AM  Result Value Ref Range   Sodium 140 135 - 145 mEq/L   Potassium 4.3 3.5 - 5.3 mEq/L   Chloride 105 96 - 112 mEq/L   CO2 28 19 - 32 mEq/L   Glucose, Bld 93 70 - 99 mg/dL   BUN 12 6 - 23 mg/dL   Creat 0.58 0.50 - 1.10 mg/dL   Total Bilirubin 0.4 0.2 - 1.2 mg/dL   Alkaline Phosphatase 66 39 - 117 U/L   AST 17 0 - 37 U/L   ALT 16 0 - 35 U/L   Total Protein 6.7 6.0 - 8.3 g/dL   Albumin 3.9 3.5 - 5.2 g/dL   Calcium 9.4 8.4 - 10.5 mg/dL  Results for orders placed or performed in visit on 08/30/14 (from the past 8736 hour(s))  POCT Urinalysis Dipstick   Collection Time: 08/30/14  9:50 AM  Result Value Ref Range   Color, UA yellow    Clarity, UA cloudy    Glucose, UA neg    Bilirubin, UA     Ketones, UA     Spec Grav, UA     Blood, UA neg     pH, UA     Protein, UA neg    Urobilinogen, UA     Nitrite, UA neg    Leukocytes, UA Negative   Urine culture   Collection Time: 08/30/14  9:56 AM  Result Value Ref Range   Colony Count 2,000 COLONIES/ML    Organism ID, Bacteria Insignificant Growth   Urinalysis   Collection Time: 08/30/14  9:56 AM  Result Value Ref Range   Color, Urine YELLOW YELLOW   APPearance CLEAR CLEAR   Specific Gravity, Urine 1.023 1.005 - 1.030   pH 6.0 5.0 - 8.0   Glucose, UA NEG NEG mg/dL   Bilirubin Urine NEG NEG   Ketones, ur NEG NEG mg/dL   Hgb urine dipstick NEG NEG   Protein, ur NEG NEG mg/dL   Urobilinogen, UA 0.2 0.0 - 1.0 mg/dL   Nitrite NEG NEG   Leukocytes, UA NEG NEG  Results for orders placed or performed in visit on 08/15/14 (from the past 8736 hour(s))  POCT Urinalysis Dipstick   Collection Time: 08/15/14 11:36 AM  Result Value Ref Range   Color, UA yellow    Clarity, UA     Glucose, UA neg    Bilirubin, UA     Ketones, UA     Spec Grav, UA     Blood, UA neg    pH, UA     Protein, UA neg    Urobilinogen, UA     Nitrite, UA neg    Leukocytes, UA Negative   POCT Wet Prep [facility] [facility])   Collection Time: 08/15/14 11:36 AM  Result Value Ref Range   Source Wet Prep POC     WBC, Wet Prep HPF POC pos  Bacteria Wet Prep HPF POC     BACTERIA WET PREP MORPHOLOGY POC     Clue Cells Wet Prep HPF POC Moderate    Clue Cells Wet Prep Whiff POC     Yeast Wet Prep HPF POC     KOH Wet Prep POC     Trichomonas Wet Prep HPF POC    Urine culture   Collection Time: 08/15/14 11:53 AM  Result Value Ref Range   Colony Count NO GROWTH    Organism ID, Bacteria NO GROWTH   Urinalysis   Collection Time: 08/15/14 11:53 AM  Result Value Ref Range   Color, Urine YELLOW YELLOW   APPearance CLEAR CLEAR   Specific Gravity, Urine 1.024 1.005 - 1.030   pH 5.0 5.0 - 8.0   Glucose, UA NEG NEG mg/dL   Bilirubin Urine NEG NEG   Ketones, ur NEG NEG mg/dL   Hgb urine dipstick NEG NEG   Protein,  ur NEG NEG mg/dL   Urobilinogen, UA 0.2 0.0 - 1.0 mg/dL   Nitrite NEG NEG   Leukocytes, UA NEG NEG  Results for orders placed or performed in visit on 07/05/14 (from the past 8736 hour(s))  Lithium level   Collection Time: 08/22/14  8:47 AM  Result Value Ref Range   Lithium Lvl 0.80 0.80 - 1.40 mEq/L  Results for orders placed or performed in visit on 03/01/14 (from the past 8736 hour(s))  Lithium level   Collection Time: 03/14/14  9:39 AM  Result Value Ref Range   Lithium Lvl 0.70 (L) 0.80 - 1.40 mEq/L  Results for orders placed or performed in visit on 02/12/14 (from the past 8736 hour(s))  CBC   Collection Time: 02/12/14 12:01 PM  Result Value Ref Range   WBC 6.7 4.0 - 10.5 K/uL   RBC 4.15 3.87 - 5.11 MIL/uL   Hemoglobin 11.4 (L) 12.0 - 15.0 g/dL   HCT 34.1 (L) 36.0 - 46.0 %   MCV 82.2 78.0 - 100.0 fL   MCH 27.5 26.0 - 34.0 pg   MCHC 33.4 30.0 - 36.0 g/dL   RDW 15.1 11.5 - 15.5 %   Platelets 283 150 - 400 K/uL  Comprehensive metabolic panel   Collection Time: 02/12/14 12:01 PM  Result Value Ref Range   Sodium 140 135 - 145 mEq/L   Potassium 4.0 3.5 - 5.3 mEq/L   Chloride 102 96 - 112 mEq/L   CO2 27 19 - 32 mEq/L   Glucose, Bld 94 70 - 99 mg/dL   BUN 6 6 - 23 mg/dL   Creat 0.54 0.50 - 1.10 mg/dL   Total Bilirubin 0.2 0.2 - 1.2 mg/dL   Alkaline Phosphatase 75 39 - 117 U/L   AST 22 0 - 37 U/L   ALT 23 0 - 35 U/L   Total Protein 6.1 6.0 - 8.3 g/dL   Albumin 3.6 3.5 - 5.2 g/dL   Calcium 8.8 8.4 - 10.5 mg/dL  TSH   Collection Time: 02/12/14 12:01 PM  Result Value Ref Range   TSH 3.067 0.350 - 4.500 uIU/mL  Lipid panel   Collection Time: 02/12/14 12:01 PM  Result Value Ref Range   Cholesterol 180 0 - 200 mg/dL   Triglycerides 133 <150 mg/dL   HDL 53 >39 mg/dL   Total CHOL/HDL Ratio 3.4 Ratio   VLDL 27 0 - 40 mg/dL   LDL Cholesterol 100 (H) 0 - 99 mg/dL  POCT Wet Prep [employer] [facility])   Collection Time: 02/12/14  12:02 PM  Result Value Ref Range   Source Wet Prep POC      WBC, Wet Prep HPF POC few    Bacteria Wet Prep HPF POC     BACTERIA WET PREP MORPHOLOGY POC     Clue Cells Wet Prep HPF POC     Clue Cells Wet Prep Whiff POC     Yeast Wet Prep HPF POC Moderate    KOH Wet Prep POC     Trichomonas Wet Prep HPF POC    Results for orders placed or performed in visit on 02/04/14 (from the past 8736 hour(s))  Lithium level   Collection Time: 02/26/14 12:01 AM  Result Value Ref Range   Lithium Lvl 1.60 (HH) 0.80 - 1.40 mEq/L  Results for orders placed or performed in visit on 01/09/14 (from the past 8736 hour(s))  POCT Wet Prep with KOH   Collection Time: 01/09/14  9:49 AM  Result Value Ref Range   Trichomonas, UA Negative    Clue Cells Wet Prep HPF POC Negative    Epithelial Wet Prep HPF POC Negative    Yeast Wet Prep HPF POC Positive    Bacteria Wet Prep HPF POC Negative    RBC Wet Prep HPF POC Negative    WBC Wet Prep HPF POC Positive    KOH Prep POC Positive   Results for orders placed or performed in visit on 12/14/13 (from the past 8736 hour(s))  Lithium level   Collection Time: 01/31/14  9:44 AM  Result Value Ref Range   Lithium Lvl 0.60 (L) 0.80 - 1.40 mEq/L   Assessment. Axis I mood disorder NOS, bipolar disorder 1, posttraumatic stress disorder, r/o Major depressive disorder Axis II rule out borderline personality trait. Axis III obesity, chronic headache. Axis IV moderate. Axis V 60-65.  Plan/Discussion: I review her history and previous notes.   She will start Xanax XR 0.5 mg twice a day and Saphris 5 mg daily at bedtime for one week and then advance to 10 mg daily at bedtime More than 50% of the time spent in psychoeducation, counseling and coordination of care. She will return in 4 weeks  MEDICATIONS this encounter: Meds ordered this encounter  Medications  . ALPRAZolam (XANAX XR) 0.5 MG 24 hr tablet    Sig: Take 1 tablet (0.5 mg total) by mouth 2 (two) times daily.    Dispense:  60 tablet    Refill:  2  . asenapine  (SAPHRIS) 5 MG SUBL 24 hr tablet    Sig: Place 1 tablet (5 mg total) under the tongue 2 (two) times daily.    Dispense:  60 tablet    Refill:  3   Medical Decision Making Problem Points:  Established problem, worsening (2), New problem, with additional work-up planned (4), Review of last therapy session (1) and Review of psycho-social stressors (1) Data Points:  Review or order clinical lab tests (1) Review of medication regiment & side effects (2) Review of new medications or change in dosage (2)  I certify that outpatient services furnished can reasonably be expected to improve the patient's condition.   Levonne Spiller, MD

## 2014-12-25 DIAGNOSIS — H35413 Lattice degeneration of retina, bilateral: Secondary | ICD-10-CM | POA: Diagnosis not present

## 2014-12-25 DIAGNOSIS — H5213 Myopia, bilateral: Secondary | ICD-10-CM | POA: Diagnosis not present

## 2014-12-25 DIAGNOSIS — H33329 Round hole, unspecified eye: Secondary | ICD-10-CM | POA: Diagnosis not present

## 2014-12-26 ENCOUNTER — Ambulatory Visit: Payer: Self-pay | Admitting: Nurse Practitioner

## 2015-01-08 ENCOUNTER — Encounter (INDEPENDENT_AMBULATORY_CARE_PROVIDER_SITE_OTHER): Payer: Self-pay | Admitting: Ophthalmology

## 2015-01-10 ENCOUNTER — Encounter (INDEPENDENT_AMBULATORY_CARE_PROVIDER_SITE_OTHER): Payer: Self-pay | Admitting: Ophthalmology

## 2015-01-14 ENCOUNTER — Ambulatory Visit (HOSPITAL_COMMUNITY): Payer: Self-pay | Admitting: Psychiatry

## 2015-01-14 ENCOUNTER — Encounter (HOSPITAL_COMMUNITY): Payer: Self-pay | Admitting: *Deleted

## 2015-01-14 ENCOUNTER — Other Ambulatory Visit: Payer: Self-pay

## 2015-01-14 ENCOUNTER — Encounter: Payer: Self-pay | Admitting: Nurse Practitioner

## 2015-01-14 ENCOUNTER — Ambulatory Visit (INDEPENDENT_AMBULATORY_CARE_PROVIDER_SITE_OTHER): Payer: Medicare Other | Admitting: Nurse Practitioner

## 2015-01-14 VITALS — BP 123/77 | HR 81 | Temp 97.7°F | Ht 60.0 in | Wt 191.0 lb

## 2015-01-14 DIAGNOSIS — D649 Anemia, unspecified: Secondary | ICD-10-CM

## 2015-01-14 DIAGNOSIS — K257 Chronic gastric ulcer without hemorrhage or perforation: Secondary | ICD-10-CM

## 2015-01-14 DIAGNOSIS — K279 Peptic ulcer, site unspecified, unspecified as acute or chronic, without hemorrhage or perforation: Secondary | ICD-10-CM

## 2015-01-14 NOTE — Progress Notes (Signed)
Referring Provider: Delphina Cahill, MD Primary Care Physician:  Delphina Cahill, MD Primary GI:  Dr. Oneida Alar  Chief Complaint  Patient presents with  . Follow-up    HPI:   34 year old female presents for follow-up on ulcer, dysphagia, and anemia. Last EGD 06/11/14 which showed partially healed gastric ulcers, moderate erosive gastritis in teh antrum which was biopsied, and mild duodenitis. Recommended continue omeprazole, low fat diet and follow-up in November 2015; pathology showed chronic active gastritis with H. Pylori, was treated with Pylera and continue omeprazole bid and repeat EGD with labs to reassess in 09/2014 at which point a repeat EGD and labs were ordered. Cancelled EGD and didn't have labs drawn. New labs faxed in 11/15/14 along with abdominal US to follow-up on liver lesion. US showed unchanged echogenic focus likely incidental hemangioma.  Today she states she's having lower back pain postprandial. Does not have a gallbladder. Is concerned about CBD stone but labs and [name] 2 months ago were normal for gallbladder exam, no CBD dilation. Symptoms started the end of last year (2015) and pain is described as aching. Is typically postprandial but can happen throughout the day. Denies N/V, abdominal pain. Has a bowel movement typically every 2 days and is normal/formed without straining. Has occasional GERD symptoms but they're rare and not bothersome. Is not currently taking a PPI. Takes OTC NSAIDs as needed, on average 3 times a week for back pain. Denies ASA powders. Denies hematochezia or melena, hematemesis.  Past Medical History  Diagnosis Date  . Headache(784.0)   . Bipolar disorder   . Depression   . Ulcer   . Vaginal irritation 02/12/2014  . Yeast infection 02/12/2014  . Obesity   . Vaginal discharge 08/15/2014  . BV (bacterial vaginosis) 08/15/2014  . Low back pain 08/15/2014  . Urinary frequency 08/30/2014    Past Surgical History  Procedure Laterality Date  . Tubal ligation      . Cholecystectomy  Feb 2015    Dr. Ladona Horns, [place]  . Esophagogastroduodenoscopy (egd) with esophageal dilation N/A 02/11/2014    Dr. 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 Esophagogastroduodenoscopy N/A 06/11/2014    DDU:KGUR DUODENITIS/MODERATE EROSIVE GASTRICTIS IN ANTRUM/PARTIALLY HEALED ULCERS    Current Outpatient Prescriptions  Medication Sig Dispense Refill  . ALPRAZolam (XANAX XR) 0.5 MG 24 hr tablet Take 1 tablet (0.5 mg total) by mouth 2 (two) times daily. 60 tablet 2  . asenapine (SAPHRIS) 5 MG SUBL 24 hr tablet Place 1 tablet (5 mg total) under the tongue 2 (two) times daily. 60 tablet 3  . cyclobenzaprine (FLEXERIL) 10 MG tablet Take 1 tablet (10 mg total) by mouth every 8 (eight) hours as needed for muscle spasms. (Patient not taking: Reported on 12/20/2014) 30 tablet 0  . fluconazole (DIFLUCAN) 150 MG tablet Take 1 now and 1 in 3 days (Patient not taking: Reported on 12/20/2014) 2 tablet 1   No current facility-administered medications for this visit.    Allergies as of 01/14/2015 - Review Complete 01/14/2015  Allergen Reaction Noted  . Risperdal [risperidone] Other (See Comments) 10/11/2012  . Macrobid [nitrofurantoin monohyd macro] Itching 01/09/2014    Family History  Problem Relation Age of Onset  . Depression Father   . Anxiety disorder Father   . Depression Sister   . Anxiety disorder Sister   . Depression Brother   . Anxiety disorder Brother   . ADD / ADHD Neg Hx   . Alcohol abuse  Neg Hx   . Drug abuse Neg Hx   . Bipolar disorder Neg Hx   . Dementia Neg Hx   . OCD Neg Hx   . Paranoid behavior Neg Hx   . Schizophrenia Neg Hx   . Seizures Neg Hx   . Sexual abuse Neg Hx   . Physical abuse Neg Hx   . Colon cancer Neg Hx   . Cancer - Other Paternal Grandfather     prostate  . Kidney disease Mother   . Diabetes Mother   . Cancer Paternal Grandmother     bladder    History   Social History  . Marital Status:  Married    Spouse Name: N/A  . Number of Children: N/A  . Years of Education: N/A   Social History Main Topics  . Smoking status: Never Smoker   . Smokeless tobacco: Never Used     Comment: NEVER SMOKED  . Alcohol Use: No  . Drug Use: No  . Sexual Activity: Yes    Birth Control/ Protection: Surgical     Comment: tubal   Other Topics Concern  . None   Social History Narrative    Review of Systems: Gen: Denies fever, chills, fatigue, weakness, weight loss.  CV: Denies chest pain, palpitations, syncope. Resp: Denies dyspnea at rest, wheezing, coughing up blood. GI: See HPI. Denies vomiting blood, jaundice, and fecal incontinence.   Denies dysphagia or odynophagia. Derm: Denies rash, itching, dry skin Psych: Denies depression, anxiety, memory loss, confusion.  Heme: Denies bruising, bleeding, and enlarged lymph nodes.  Physical Exam: BP 123/77 mmHg  Pulse 81  Temp(Src) 97.7 F (36.5 C)  Ht 5' (1.524 m)  Wt 191 lb (86.637 kg)  BMI 37.30 kg/m2  LMP 01/06/2015 General:   Alert and oriented. No distress noted. Pleasant and cooperative.  Head:  Normocephalic and atraumatic. Mouth:  Oral mucosa pink and moist. Good dentition. No lesions.No OP edema. Neck:  Supple, without mass or thyromegaly. Lungs:  Clear to auscultation bilaterally. No wheezes, rales, or rhonchi. No distress.  Heart:  S1, S2 present without murmurs, rubs, or gallops. Regular rate and rhythm. Abdomen:  +BS, soft, and non-distended. Mild epigastric TTP. No rebound or guarding. No HSM or masses noted. Extremities:  Without edema. Neurologic:  Alert and  oriented x4;  grossly normal neurologically. Skin:  Intact without significant lesions or rashes. Psych:  Alert and cooperative. Normal mood and affect.    01/14/2015 9:00 AM

## 2015-01-14 NOTE — Patient Instructions (Signed)
1. We will schedule your endoscopy for you 2. Further recommendations to be based on the results of your procedure.

## 2015-01-16 ENCOUNTER — Telehealth: Payer: Self-pay | Admitting: Nurse Practitioner

## 2015-01-16 NOTE — Telephone Encounter (Signed)
Please call patient and verify whether or not she's taking her Omeprazole as previously recommended after her EGDl. If not encourage her to do so. We cann call in a new Rx if needed, just let me know.

## 2015-01-16 NOTE — Assessment & Plan Note (Signed)
Anemia seems resolved as per last CBC. No signs or symptoms of overt GI bleed or anemia. Will continue to follow. Further recommendations to be based on the results of the follow-up EGD at the end of this month.

## 2015-01-16 NOTE — Assessment & Plan Note (Signed)
Previously cancelled follow-up EGD has been now rescheduled for 02/06/15 for follow-up on PUD and H. Pylori gastritis treated with Pylera. Patiant appears to not be on a PPI despite recommendation. Will send in an Rx and have staff call her to encourage her to fill and take this. Continues to take OTC NSAIDs about 3 times a week, discussed role of NSAIDs in gastritis and best to avoid them. Denies ASA powders. No red flag/warning signs/symptoms. Repeat US appears lver lesion unchanged and suggestive of incidental hemangioma. [name] otherwise normal with no pathology of the biliary system.  Proceed with EGD with Dr. Oneida Alar in near future: the risks, benefits, and alternatives have been discussed with the patient in detail. The patient states understanding and desires to proceed.

## 2015-01-17 ENCOUNTER — Other Ambulatory Visit: Payer: Self-pay | Admitting: Nurse Practitioner

## 2015-01-17 ENCOUNTER — Encounter (INDEPENDENT_AMBULATORY_CARE_PROVIDER_SITE_OTHER): Payer: Medicare Other | Admitting: Ophthalmology

## 2015-01-17 DIAGNOSIS — H33303 Unspecified retinal break, bilateral: Secondary | ICD-10-CM | POA: Diagnosis not present

## 2015-01-17 DIAGNOSIS — H35413 Lattice degeneration of retina, bilateral: Secondary | ICD-10-CM | POA: Diagnosis not present

## 2015-01-17 DIAGNOSIS — H43813 Vitreous degeneration, bilateral: Secondary | ICD-10-CM | POA: Diagnosis not present

## 2015-01-17 MED ORDER — OMEPRAZOLE 20 MG PO CPDR: 20.0000 mg | DELAYED_RELEASE_CAPSULE | Freq: Every day | ORAL | Status: DC

## 2015-01-17 NOTE — Telephone Encounter (Signed)
Pt said she has not been taking Omeprazole. She is out. OK to send Rx to [facility] in [place]. She was encouraged to take as directed .

## 2015-01-17 NOTE — Progress Notes (Signed)
cc'ed to pcp °

## 2015-01-17 NOTE — Telephone Encounter (Signed)
Rx sent in. Please notify patient

## 2015-01-20 DIAGNOSIS — F319 Bipolar disorder, unspecified: Secondary | ICD-10-CM | POA: Diagnosis not present

## 2015-01-20 DIAGNOSIS — Z6835 Body mass index (BMI) 35.0-35.9, adult: Secondary | ICD-10-CM | POA: Diagnosis not present

## 2015-01-20 DIAGNOSIS — R109 Unspecified abdominal pain: Secondary | ICD-10-CM | POA: Diagnosis not present

## 2015-01-20 DIAGNOSIS — R7301 Impaired fasting glucose: Secondary | ICD-10-CM | POA: Diagnosis not present

## 2015-01-20 NOTE — Telephone Encounter (Signed)
Pt is aware.  

## 2015-01-22 ENCOUNTER — Encounter (HOSPITAL_COMMUNITY): Payer: Self-pay | Admitting: Psychiatry

## 2015-01-22 ENCOUNTER — Ambulatory Visit (INDEPENDENT_AMBULATORY_CARE_PROVIDER_SITE_OTHER): Payer: Medicare Other | Admitting: Psychiatry

## 2015-01-22 VITALS — BP 137/90 | HR 76 | Ht 60.0 in | Wt 193.6 lb

## 2015-01-22 DIAGNOSIS — F39 Unspecified mood [affective] disorder: Secondary | ICD-10-CM

## 2015-01-22 DIAGNOSIS — F3162 Bipolar disorder, current episode mixed, moderate: Secondary | ICD-10-CM

## 2015-01-22 DIAGNOSIS — F4310 Post-traumatic stress disorder, unspecified: Secondary | ICD-10-CM

## 2015-01-22 MED ORDER — LAMOTRIGINE 25 MG PO TABS: ORAL_TABLET | ORAL | Status: DC

## 2015-01-22 MED ORDER — ALPRAZOLAM ER 1 MG PO TB24: 1.0000 mg | ORAL_TABLET | Freq: Three times a day (TID) | ORAL | Status: DC

## 2015-01-22 NOTE — Progress Notes (Signed)
Patient ID: DALILA ARCA, female   DOB: 07/13/1981, 34 y.o.   MRN: [id number] Patient ID: AVIA MERKLEY, female   DOB: 1981/05/29, 34 y.o.   MRN: [id number] Patient ID: JORDAIN RADIN, female   DOB: 1981/05/22, 34 y.o.   MRN: [id number] Patient ID: KONSTANTINA NACHREINER, female   DOB: [birth year], 34 y.o.   MRN: [id number] Patient ID: BRICEIDA RASBERRY, female   DOB: 1981-08-27, 34 y.o.   MRN: [id number] Patient ID: NAIJA TROOST, female   DOB: 18-May-1981, 34 y.o.   MRN: [id number] Patient ID: BELEN PESCH, female   DOB: 1981/04/15, 34 y.o.   MRN: [id number] Patient ID: NARALY FRITCHER, female   DOB: Aug 02, 1981, 34 y.o.   MRN: [id number] Patient ID: TIMIRA BIEDA, female   DOB: 20-Jun-1981, 34 y.o.   MRN: [id number] Patient ID: PACEY WILLADSEN, female   DOB: 1981-03-23, 34 y.o.   MRN: [id number] Patient ID: COLBI SCHILTZ, female   DOB: 1981-04-25, 34 y.o.   MRN: [id number] Patient ID: IASHA MCCALISTER, female   DOB: 1981-09-24, 34 y.o.   MRN: [id number] Patient ID: MARGARETHA MAHAN, female   DOB: 18-Mar-1981, 34 y.o.   MRN: [id number] Patient ID: COLLEENE SWARTHOUT, female   DOB: Dec 09, 1980, 34 y.o.   MRN: [id number] [place] [address] Progress Note JERYL WILBOURN MRN: [id number] DOB: [birth year] Age: 34 y.o.  Date: 01/22/2015  Chief Complaint: Chief Complaint  Patient presents with  . Manic Behavior  . Depression  . 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  The patient returns after  4 weeks. Last time we started Saphris but it makes her drowsy and hasn't helped mood. She still feels anxious, sped-up and only sleeps 4 hrs pernight. She denies being depressed. She thinks she did the best on Lamictal so we can restart this. She is bery anxious and will need an increase in xanax .Marland Kitchen Past psychiatric history. Patient has history of significant  depression and mood swings. She has at least 4 psychiatric admissions in past.  In 2000-2003 she was admitted due to cutting herself and suicidal thinking. In the past. She had tried Geodon, Depakote, Lamictal, Prozac, Abilify, Lexapro, Celexa, Effexor, Wellbutrin, BuSpar, Paxil, Risperdal, Ativan and Klonopin. She admitted history of anger, severe mood swings, and irritability.  Her last psychiatric admission was in June 2013.  Psychosocial history. Patient was born and lived in [place]. She's been married twice. She has one son from her previous marriage and a daughter from her second marriage.    Allergies: Allergies  Allergen Reactions  . Risperdal [Risperidone] Other (See Comments)    Wt gain  . Macrobid [Nitrofurantoin Monohyd Macro] Itching    Medical History: Past Medical History  Diagnosis Date  . Headache(784.0)   . Bipolar disorder   . Depression   . Ulcer   . Vaginal irritation 02/12/2014  . Yeast infection 02/12/2014  . Obesity   . Vaginal discharge 08/15/2014  . BV (bacterial vaginosis) 08/15/2014  . Low back pain 08/15/2014  . Urinary frequency 08/30/2014  Patient has obesity, and chronic migraine headache. . She is taking Imitrex as needed for headache.  Her blood work in March 2013 showed hemoglobin A1c is 6 however  her liver enzymes and kidney functions are normal.  Her primary care physician is [facility] internal medicine.    Surgical History: Past Surgical History  Procedure Laterality Date  . Tubal ligation    . Cholecystectomy  Feb 2015    Dr. Ladona Horns, [place]  . Esophagogastroduodenoscopy (egd) with esophageal dilation N/A 02/11/2014    Dr. 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 Patient endorsed significant history of depression anxiety in her family. Her brother, sister and  father has depression and anxiety. family history includes Anxiety disorder in her brother, father, and sister; Cancer in her paternal grandmother; Cancer - Other in her paternal grandfather; Depression in her brother, father, and sister; Diabetes in her mother; Kidney disease in her mother. There is no history of ADD / ADHD, Alcohol abuse, Drug abuse, Bipolar disorder, Dementia, OCD, Paranoid behavior, Schizophrenia, Seizures, Sexual abuse, Physical abuse, or Colon cancer. Reviewed all this again today in the session and nothing has changed.  Education background and work history. She has bachelor degree in Programmer, applications.  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 She quit her job in December 2012 .  She worked briefly at [employer] but quit her job due to feeling overwhelmed.  She recently worked at [employer] as a Scientist, water quality for 2 weeks only.    Alcohol and substance use history. Patient denies any history of alcohol or substance use. She denies a history of DWI.  She has used Xanax for two years and is not willing to stop this for Neurontin for her anxiety despite noting memory loss with the use of Xanax.  Filed Vitals:   01/22/15 0928  BP: 137/90  Pulse: 76    Mental status examination. Patient is a somewhat overweight white female, who is nicely dressed She is anxious but cooperative.  She described her mood as anxious and her affect is congruent She maintained fair eye contact.  She denies any active or passive suicidal thoughts or homicidal thoughts.  She denies any auditory hallucination or visual hallucination.  She denies paranoid ideation. She she claims that her thoughts are racing but her speech is clear coherent and logical There were no flight of ideas or loose association .  Her attention and concentration is fair.  She's alert and oriented x3. Her insight judgment and impulse control is fair.  Lab Results:  Results for orders placed or performed in  visit on 11/18/14 (from the past 8736 hour(s))  CBC w/Diff   Collection Time: 11/21/14 11:42 AM  Result Value Ref Range   WBC 5.9 4.0 - 10.5 K/uL   RBC 4.50 3.87 - 5.11 MIL/uL   Hemoglobin 12.6 12.0 - 15.0 g/dL   HCT 37.9 36.0 - 46.0 %   MCV 84.2 78.0 - 100.0 fL   MCH 28.0 26.0 - 34.0 pg   MCHC 33.2 30.0 - 36.0 g/dL   RDW 14.1 11.5 - 15.5 %   Platelets 231 150 - 400 K/uL   MPV 10.5 8.6 - 12.4 fL   Neutrophils Relative % 63 43 - 77 %   Neutro Abs 3.7 1.7 - 7.7 K/uL   Lymphocytes Relative 31 12 - 46 %   Lymphs Abs 1.8 0.7 - 4.0 K/uL   Monocytes Relative 5 3 - 12 %   Monocytes Absolute 0.3 0.1 - 1.0 K/uL   Eosinophils  Relative 1 0 - 5 %   Eosinophils Absolute 0.1 0.0 - 0.7 K/uL   Basophils Relative 0 0 - 1 %   Basophils Absolute 0.0 0.0 - 0.1 K/uL   Smear Review Criteria for review not met   Comp Met (CMET)   Collection Time: 11/21/14 11:42 AM  Result Value Ref Range   Sodium 140 135 - 145 mEq/L   Potassium 4.3 3.5 - 5.3 mEq/L   Chloride 105 96 - 112 mEq/L   CO2 28 19 - 32 mEq/L   Glucose, Bld 93 70 - 99 mg/dL   BUN 12 6 - 23 mg/dL   Creat 0.58 0.50 - 1.10 mg/dL   Total Bilirubin 0.4 0.2 - 1.2 mg/dL   Alkaline Phosphatase 66 39 - 117 U/L   AST 17 0 - 37 U/L   ALT 16 0 - 35 U/L   Total Protein 6.7 6.0 - 8.3 g/dL   Albumin 3.9 3.5 - 5.2 g/dL   Calcium 9.4 8.4 - 10.5 mg/dL  Results for orders placed or performed in visit on 08/30/14 (from the past 8736 hour(s))  POCT Urinalysis Dipstick   Collection Time: 08/30/14  9:50 AM  Result Value Ref Range   Color, UA yellow    Clarity, UA cloudy    Glucose, UA neg    Bilirubin, UA     Ketones, UA     Spec Grav, UA     Blood, UA neg    pH, UA     Protein, UA neg    Urobilinogen, UA     Nitrite, UA neg    Leukocytes, UA Negative   Urine culture   Collection Time: 08/30/14  9:56 AM  Result Value Ref Range   Colony Count 2,000 COLONIES/ML    Organism ID, Bacteria Insignificant Growth   Urinalysis   Collection Time:  08/30/14  9:56 AM  Result Value Ref Range   Color, Urine YELLOW YELLOW   APPearance CLEAR CLEAR   Specific Gravity, Urine 1.023 1.005 - 1.030   pH 6.0 5.0 - 8.0   Glucose, UA NEG NEG mg/dL   Bilirubin Urine NEG NEG   Ketones, ur NEG NEG mg/dL   Hgb urine dipstick NEG NEG   Protein, ur NEG NEG mg/dL   Urobilinogen, UA 0.2 0.0 - 1.0 mg/dL   Nitrite NEG NEG   Leukocytes, UA NEG NEG  Results for orders placed or performed in visit on 08/15/14 (from the past 8736 hour(s))  POCT Urinalysis Dipstick   Collection Time: 08/15/14 11:36 AM  Result Value Ref Range   Color, UA yellow    Clarity, UA     Glucose, UA neg    Bilirubin, UA     Ketones, UA     Spec Grav, UA     Blood, UA neg    pH, UA     Protein, UA neg    Urobilinogen, UA     Nitrite, UA neg    Leukocytes, UA Negative   POCT Wet Prep [facility] [place])   Collection Time: 08/15/14 11:36 AM  Result Value Ref Range   Source Wet Prep POC     WBC, Wet Prep HPF POC pos    Bacteria Wet Prep HPF POC     BACTERIA WET PREP MORPHOLOGY POC     Clue Cells Wet Prep HPF POC Moderate    Clue Cells Wet Prep Whiff POC     Yeast Wet Prep HPF POC     KOH Wet  Prep POC     Trichomonas Wet Prep HPF POC    Urine culture   Collection Time: 08/15/14 11:53 AM  Result Value Ref Range   Colony Count NO GROWTH    Organism ID, Bacteria NO GROWTH   Urinalysis   Collection Time: 08/15/14 11:53 AM  Result Value Ref Range   Color, Urine YELLOW YELLOW   APPearance CLEAR CLEAR   Specific Gravity, Urine 1.024 1.005 - 1.030   pH 5.0 5.0 - 8.0   Glucose, UA NEG NEG mg/dL   Bilirubin Urine NEG NEG   Ketones, ur NEG NEG mg/dL   Hgb urine dipstick NEG NEG   Protein, ur NEG NEG mg/dL   Urobilinogen, UA 0.2 0.0 - 1.0 mg/dL   Nitrite NEG NEG   Leukocytes, UA NEG NEG  Results for orders placed or performed in visit on 07/05/14 (from the past 8736 hour(s))  Lithium level   Collection Time: 08/22/14  8:47 AM  Result Value Ref Range   Lithium Lvl 0.80 0.80  - 1.40 mEq/L  Results for orders placed or performed in visit on 03/01/14 (from the past 8736 hour(s))  Lithium level   Collection Time: 03/14/14  9:39 AM  Result Value Ref Range   Lithium Lvl 0.70 (L) 0.80 - 1.40 mEq/L  Results for orders placed or performed in visit on 02/12/14 (from the past 8736 hour(s))  CBC   Collection Time: 02/12/14 12:01 PM  Result Value Ref Range   WBC 6.7 4.0 - 10.5 K/uL   RBC 4.15 3.87 - 5.11 MIL/uL   Hemoglobin 11.4 (L) 12.0 - 15.0 g/dL   HCT 34.1 (L) 36.0 - 46.0 %   MCV 82.2 78.0 - 100.0 fL   MCH 27.5 26.0 - 34.0 pg   MCHC 33.4 30.0 - 36.0 g/dL   RDW 15.1 11.5 - 15.5 %   Platelets 283 150 - 400 K/uL  Comprehensive metabolic panel   Collection Time: 02/12/14 12:01 PM  Result Value Ref Range   Sodium 140 135 - 145 mEq/L   Potassium 4.0 3.5 - 5.3 mEq/L   Chloride 102 96 - 112 mEq/L   CO2 27 19 - 32 mEq/L   Glucose, Bld 94 70 - 99 mg/dL   BUN 6 6 - 23 mg/dL   Creat 0.54 0.50 - 1.10 mg/dL   Total Bilirubin 0.2 0.2 - 1.2 mg/dL   Alkaline Phosphatase 75 39 - 117 U/L   AST 22 0 - 37 U/L   ALT 23 0 - 35 U/L   Total Protein 6.1 6.0 - 8.3 g/dL   Albumin 3.6 3.5 - 5.2 g/dL   Calcium 8.8 8.4 - 10.5 mg/dL  TSH   Collection Time: 02/12/14 12:01 PM  Result Value Ref Range   TSH 3.067 0.350 - 4.500 uIU/mL  Lipid panel   Collection Time: 02/12/14 12:01 PM  Result Value Ref Range   Cholesterol 180 0 - 200 mg/dL   Triglycerides 133 <150 mg/dL   HDL 53 >39 mg/dL   Total CHOL/HDL Ratio 3.4 Ratio   VLDL 27 0 - 40 mg/dL   LDL Cholesterol 100 (H) 0 - 99 mg/dL  POCT Wet Prep [facility])   Collection Time: 02/12/14 12:02 PM  Result Value Ref Range   Source Wet Prep POC     WBC, Wet Prep HPF POC few    Bacteria Wet Prep HPF POC     BACTERIA WET PREP MORPHOLOGY POC     Clue Cells Wet Prep HPF  POC     Clue Cells Wet Prep Whiff POC     Yeast Wet Prep HPF POC Moderate    KOH Wet Prep POC     Trichomonas Wet Prep HPF POC    Results for orders placed or  performed in visit on 02/04/14 (from the past 8736 hour(s))  Lithium level   Collection Time: 02/26/14 12:01 AM  Result Value Ref Range   Lithium Lvl 1.60 (HH) 0.80 - 1.40 mEq/L  Results for orders placed or performed in visit on 12/14/13 (from the past 8736 hour(s))  Lithium level   Collection Time: 01/31/14  9:44 AM  Result Value Ref Range   Lithium Lvl 0.60 (L) 0.80 - 1.40 mEq/L   Assessment. Axis I mood disorder NOS, bipolar disorder 1, posttraumatic stress disorder, r/o Major depressive disorder Axis II rule out borderline personality trait. Axis III obesity, chronic headache. Axis IV moderate. Axis V 60-65.  Plan/Discussion: I review her history and previous notes.   She will stop Saphris and start Lamictal at 25 mg daily and work up to 100 mg daily obver 4 weeks. She will increase XanaxXR to 1 mg tid.  More than 50% of the time spent in psychoeducation, counseling and coordination of care. She will return in 4 weeks  MEDICATIONS this encounter: Meds ordered this encounter  Medications  . ALPRAZolam (XANAX XR) 1 MG 24 hr tablet    Sig: Take 1 tablet (1 mg total) by mouth 3 (three) times daily.    Dispense:  90 tablet    Refill:  2  . lamoTRIgine (LAMICTAL) 25 MG tablet    Sig: Take one pill daily for one week, add one pill per week until up to two pills twice a day    Dispense:  120 tablet    Refill:  2   Medical Decision Making Problem Points:  Established problem, worsening (2), New problem, with additional work-up planned (4), Review of last therapy session (1) and Review of psycho-social stressors (1) Data Points:  Review or order clinical lab tests (1) Review of medication regiment & side effects (2) Review of new medications or change in dosage (2)  I certify that outpatient services furnished can reasonably be expected to improve the patient's condition.   Levonne Spiller, MD

## 2015-01-28 ENCOUNTER — Encounter (INDEPENDENT_AMBULATORY_CARE_PROVIDER_SITE_OTHER): Payer: 59 | Admitting: Ophthalmology

## 2015-02-03 ENCOUNTER — Other Ambulatory Visit: Payer: Self-pay

## 2015-02-03 ENCOUNTER — Telehealth: Payer: Self-pay

## 2015-02-03 NOTE — Telephone Encounter (Signed)
Pt called office requesting to move procedure to a different day due to a schedule conflict with her ride. Pt request to have procedure on 02/24/2015.  Orders have been transferred to 02/24/2015 @ 9:30am.   Reviewed medications with pt and she denies any changes to any of her medical history.

## 2015-02-04 NOTE — Telephone Encounter (Signed)
REVIEWED-NO ADDITIONAL RECOMMENDATIONS. 

## 2015-02-05 ENCOUNTER — Telehealth: Payer: Self-pay

## 2015-02-05 NOTE — Telephone Encounter (Signed)
Pt called to reschedule her EGD from 02/24/15 to 03/18/15. We weill need to call to update information.

## 2015-02-05 NOTE — Telephone Encounter (Signed)
REVIEWED-NO ADDITIONAL RECOMMENDATIONS. 

## 2015-02-19 ENCOUNTER — Encounter: Payer: Self-pay | Admitting: Adult Health

## 2015-02-19 ENCOUNTER — Ambulatory Visit (INDEPENDENT_AMBULATORY_CARE_PROVIDER_SITE_OTHER): Payer: Medicare Other | Admitting: Psychiatry

## 2015-02-19 ENCOUNTER — Other Ambulatory Visit (HOSPITAL_COMMUNITY)
Admission: RE | Admit: 2015-02-19 | Discharge: 2015-02-19 | Disposition: A | Discharge status: Home / Self Care | Payer: Medicare Other | Source: Ambulatory Visit | Attending: Adult Health | Admitting: Adult Health

## 2015-02-19 ENCOUNTER — Ambulatory Visit (INDEPENDENT_AMBULATORY_CARE_PROVIDER_SITE_OTHER): Payer: Medicare Other | Admitting: Adult Health

## 2015-02-19 ENCOUNTER — Encounter (HOSPITAL_COMMUNITY): Payer: Self-pay | Admitting: Psychiatry

## 2015-02-19 VITALS — BP 140/82 | HR 72 | Ht 60.0 in | Wt 195.0 lb

## 2015-02-19 VITALS — BP 139/82 | HR 74 | Ht 60.0 in | Wt 194.8 lb

## 2015-02-19 DIAGNOSIS — R350 Frequency of micturition: Secondary | ICD-10-CM

## 2015-02-19 DIAGNOSIS — F4310 Post-traumatic stress disorder, unspecified: Secondary | ICD-10-CM | POA: Diagnosis not present

## 2015-02-19 DIAGNOSIS — Z124 Encounter for screening for malignant neoplasm of cervix: Secondary | ICD-10-CM | POA: Diagnosis not present

## 2015-02-19 DIAGNOSIS — Z1151 Encounter for screening for human papillomavirus (HPV): Secondary | ICD-10-CM | POA: Diagnosis not present

## 2015-02-19 DIAGNOSIS — M545 Low back pain, unspecified: Secondary | ICD-10-CM

## 2015-02-19 DIAGNOSIS — F39 Unspecified mood [affective] disorder: Secondary | ICD-10-CM | POA: Diagnosis not present

## 2015-02-19 DIAGNOSIS — Z01419 Encounter for gynecological examination (general) (routine) without abnormal findings: Secondary | ICD-10-CM

## 2015-02-19 DIAGNOSIS — F3162 Bipolar disorder, current episode mixed, moderate: Secondary | ICD-10-CM

## 2015-02-19 DIAGNOSIS — F319 Bipolar disorder, unspecified: Secondary | ICD-10-CM

## 2015-02-19 MED ORDER — PHENAZOPYRIDINE HCL 200 MG PO TABS: 200.0000 mg | ORAL_TABLET | Freq: Three times a day (TID) | ORAL | Status: DC | PRN

## 2015-02-19 MED ORDER — LAMOTRIGINE 100 MG PO TABS: 100.0000 mg | ORAL_TABLET | Freq: Two times a day (BID) | ORAL | Status: DC

## 2015-02-19 NOTE — Patient Instructions (Signed)

## 2015-02-19 NOTE — Progress Notes (Signed)
Patient ID: Teresa Tran, female   DOB: [birth year], 34 y.o.   MRN: [id number] Patient ID: Teresa Tran, female   DOB: 1981/08/13, 34 y.o.   MRN: [id number] Patient ID: Teresa Tran, female   DOB: July 17, 1981, 34 y.o.   MRN: [id number] Patient ID: Teresa Tran, female   DOB: 1981-01-11, 34 y.o.   MRN: [id number] Patient ID: Teresa Tran, female   DOB: 1981/05/10, 34 y.o.   MRN: [id number] Patient ID: Teresa Tran, female   DOB: Nov 10, 1980, 34 y.o.   MRN: [id number] Patient ID: Teresa Tran, female   DOB: September 18, 1981, 34 y.o.   MRN: [id number] Patient ID: Teresa Tran, female   DOB: [birth year], 34 y.o.   MRN: [id number] Patient ID: Teresa Tran, female   DOB: 1981/07/24, 34 y.o.   MRN: [id number] Patient ID: MAN BONNEAU, female   DOB: [birth year], 34 y.o.   MRN: [id number] Patient ID: SASHIA CAMPAS, female   DOB: [birth year], 34 y.o.   MRN: [id number] Patient ID: SOFFIA DOSHIER, female   DOB: 06/23/1981, 34 y.o.   MRN: [id number] Patient ID: LAKITHA GORDY, female   DOB: 1980-12-07, 34 y.o.   MRN: [id number] Patient ID: SALINDA SNEDEKER, female   DOB: Nov 21, 1980, 34 y.o.   MRN: [id number] Patient ID: KARCYN MENN, female   DOB: 1981/09/27, 33 y.o.   MRN: [id number] [place] [id number] Progress Note NICOLA HEINEMANN MRN: [id number] DOB: 03/09/1981 Age: 34 y.o.  Date: 02/19/2015  Chief Complaint: Chief Complaint  Patient presents with  . Depression  . Manic Behavior  . Anxiety  . 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  The patient returns after  4 weeks. Last time we restarted Lamictal and she is up to 25 mg twice a day. She had a delay in getting the medication. She is trying to get up to 100 mg daily. She hasn't seen much result from it but it will need to be increased over time. She's had a good result from it in the  past. She still feels anxious and sped up but the Xanax has been helpful. At times she has thoughts of cutting herself but she is fighting them by prayer. She is very involved in a church now and goes to numerous support groups which is helped tremendously. She states that she is committed to not harming herself again. .. Past psychiatric history. Patient has history of significant depression and mood swings. She has at least 4 psychiatric admissions in past.  In 2000-2003 she was admitted due to cutting herself and suicidal thinking. In the past. She had tried Geodon, Depakote, Lamictal, Prozac, Abilify, Lexapro, Celexa, Effexor, Wellbutrin, BuSpar, Paxil, Risperdal, Ativan and Klonopin. She admitted history of anger, severe mood swings, and irritability.  Her last psychiatric admission was in June 2013.  Psychosocial history. Patient was born and lived in [place]. She's been married twice. She has one son from her previous marriage and a daughter from her second marriage.    Allergies: Allergies  Allergen Reactions  . Risperdal [Risperidone] Other (See Comments)    Wt gain  . Macrobid [Nitrofurantoin Monohyd Macro] Itching    Medical History: Past Medical History  Diagnosis Date  .  Headache(784.0)   . Bipolar disorder   . Depression   . Ulcer   . Vaginal irritation 02/12/2014  . Yeast infection 02/12/2014  . Obesity   . Vaginal discharge 08/15/2014  . BV (bacterial vaginosis) 08/15/2014  . Low back pain 08/15/2014  . Urinary frequency 08/30/2014  Patient has obesity, and chronic migraine headache. . She is taking Imitrex as needed for headache.  Her blood work in March 2013 showed hemoglobin A1c is 6 however her liver enzymes and kidney functions are normal.  Her primary care physician is [facility] internal medicine.    Surgical History: Past Surgical History  Procedure Laterality Date  . Tubal ligation    . Cholecystectomy  Feb 2015    Dr. Ladona Horns, [place]  .  Esophagogastroduodenoscopy (egd) with esophageal dilation N/A 02/11/2014    Dr. 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 Esophagogastroduodenoscopy N/A 06/11/2014    BTY:OMAY DUODENITIS/MODERATE EROSIVE GASTRICTIS IN ANTRUM/PARTIALLY HEALED ULCERS   Family history. Patient endorsed significant history of depression anxiety in her family. Her brother, sister and father has depression and anxiety. family history includes Anxiety disorder in her brother, father, and sister; Cancer in her paternal grandmother; Cancer - Other in her paternal grandfather; Depression in her brother, father, and sister; Diabetes in her mother; Kidney disease in her mother. There is no history of ADD / ADHD, Alcohol abuse, Drug abuse, Bipolar disorder, Dementia, OCD, Paranoid behavior, Schizophrenia, Seizures, Sexual abuse, Physical abuse, or Colon cancer. Reviewed all this again today in the session and nothing has changed.  Education background and work history. She has bachelor degree in Programmer, applications.  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 She quit her job in December 2012 .  She worked briefly at [employer] but quit her job due to feeling overwhelmed.  She recently worked at [employer] as a Scientist, water quality for 2 weeks only.    Alcohol and substance use history. Patient denies any history of alcohol or substance use. She denies a history of DWI.  She has used Xanax for two years and is not willing to stop this for Neurontin for her anxiety despite noting memory loss with the use of Xanax.  Filed Vitals:   02/19/15 0944  BP: 139/82  Pulse: 74    Mental status examination. Patient is a somewhat overweight white female, who is nicely dressed She is anxious but cooperative.  She described her mood as anxious and her affect is congruent She maintained fair eye contact.  She denies any active or passive suicidal thoughts or homicidal thoughts.  She denies  any auditory hallucination or visual hallucination.  She denies paranoid ideation. She she claims that her thoughts are racing but her speech is clear coherent and logical There were no flight of ideas or loose association .  Her attention and concentration is fair.  She's alert and oriented x3. Her insight judgment and impulse control is fair.  Lab Results:  Results for orders placed or performed in visit on 11/18/14 (from the past 8736 hour(s))  CBC w/Diff   Collection Time: 11/21/14 11:42 AM  Result Value Ref Range   WBC 5.9 4.0 - 10.5 K/uL   RBC 4.50 3.87 - 5.11 MIL/uL   Hemoglobin 12.6 12.0 - 15.0 g/dL   HCT 37.9 36.0 - 46.0 %   MCV 84.2 78.0 - 100.0 fL   MCH 28.0 26.0 - 34.0 pg   MCHC 33.2 30.0 - 36.0 g/dL  RDW 14.1 11.5 - 15.5 %   Platelets 231 150 - 400 K/uL   MPV 10.5 8.6 - 12.4 fL   Neutrophils Relative % 63 43 - 77 %   Neutro Abs 3.7 1.7 - 7.7 K/uL   Lymphocytes Relative 31 12 - 46 %   Lymphs Abs 1.8 0.7 - 4.0 K/uL   Monocytes Relative 5 3 - 12 %   Monocytes Absolute 0.3 0.1 - 1.0 K/uL   Eosinophils Relative 1 0 - 5 %   Eosinophils Absolute 0.1 0.0 - 0.7 K/uL   Basophils Relative 0 0 - 1 %   Basophils Absolute 0.0 0.0 - 0.1 K/uL   Smear Review Criteria for review not met   Comp Met (CMET)   Collection Time: 11/21/14 11:42 AM  Result Value Ref Range   Sodium 140 135 - 145 mEq/L   Potassium 4.3 3.5 - 5.3 mEq/L   Chloride 105 96 - 112 mEq/L   CO2 28 19 - 32 mEq/L   Glucose, Bld 93 70 - 99 mg/dL   BUN 12 6 - 23 mg/dL   Creat 0.58 0.50 - 1.10 mg/dL   Total Bilirubin 0.4 0.2 - 1.2 mg/dL   Alkaline Phosphatase 66 39 - 117 U/L   AST 17 0 - 37 U/L   ALT 16 0 - 35 U/L   Total Protein 6.7 6.0 - 8.3 g/dL   Albumin 3.9 3.5 - 5.2 g/dL   Calcium 9.4 8.4 - 10.5 mg/dL  Results for orders placed or performed in visit on 08/30/14 (from the past 8736 hour(s))  POCT Urinalysis Dipstick   Collection Time: 08/30/14  9:50 AM  Result Value Ref Range   Color, UA yellow    Clarity,  UA cloudy    Glucose, UA neg    Bilirubin, UA     Ketones, UA     Spec Grav, UA     Blood, UA neg    pH, UA     Protein, UA neg    Urobilinogen, UA     Nitrite, UA neg    Leukocytes, UA Negative   Urine culture   Collection Time: 08/30/14  9:56 AM  Result Value Ref Range   Colony Count 2,000 COLONIES/ML    Organism ID, Bacteria Insignificant Growth   Urinalysis   Collection Time: 08/30/14  9:56 AM  Result Value Ref Range   Color, Urine YELLOW YELLOW   APPearance CLEAR CLEAR   Specific Gravity, Urine 1.023 1.005 - 1.030   pH 6.0 5.0 - 8.0   Glucose, UA NEG NEG mg/dL   Bilirubin Urine NEG NEG   Ketones, ur NEG NEG mg/dL   Hgb urine dipstick NEG NEG   Protein, ur NEG NEG mg/dL   Urobilinogen, UA 0.2 0.0 - 1.0 mg/dL   Nitrite NEG NEG   Leukocytes, UA NEG NEG  Results for orders placed or performed in visit on 08/15/14 (from the past 8736 hour(s))  POCT Urinalysis Dipstick   Collection Time: 08/15/14 11:36 AM  Result Value Ref Range   Color, UA yellow    Clarity, UA     Glucose, UA neg    Bilirubin, UA     Ketones, UA     Spec Grav, UA     Blood, UA neg    pH, UA     Protein, UA neg    Urobilinogen, UA     Nitrite, UA neg    Leukocytes, UA Negative   POCT Wet Prep [employer])  Collection Time: 08/15/14 11:36 AM  Result Value Ref Range   Source Wet Prep POC     WBC, Wet Prep HPF POC pos    Bacteria Wet Prep HPF POC     BACTERIA WET PREP MORPHOLOGY POC     Clue Cells Wet Prep HPF POC Moderate    Clue Cells Wet Prep Whiff POC     Yeast Wet Prep HPF POC     KOH Wet Prep POC     Trichomonas Wet Prep HPF POC    Urine culture   Collection Time: 08/15/14 11:53 AM  Result Value Ref Range   Colony Count NO GROWTH    Organism ID, Bacteria NO GROWTH   Urinalysis   Collection Time: 08/15/14 11:53 AM  Result Value Ref Range   Color, Urine YELLOW YELLOW   APPearance CLEAR CLEAR   Specific Gravity, Urine 1.024 1.005 - 1.030   pH 5.0 5.0 - 8.0   Glucose, UA NEG NEG  mg/dL   Bilirubin Urine NEG NEG   Ketones, ur NEG NEG mg/dL   Hgb urine dipstick NEG NEG   Protein, ur NEG NEG mg/dL   Urobilinogen, UA 0.2 0.0 - 1.0 mg/dL   Nitrite NEG NEG   Leukocytes, UA NEG NEG  Results for orders placed or performed in visit on 07/05/14 (from the past 8736 hour(s))  Lithium level   Collection Time: 08/22/14  8:47 AM  Result Value Ref Range   Lithium Lvl 0.80 0.80 - 1.40 mEq/L  Results for orders placed or performed in visit on 03/01/14 (from the past 8736 hour(s))  Lithium level   Collection Time: 03/14/14  9:39 AM  Result Value Ref Range   Lithium Lvl 0.70 (L) 0.80 - 1.40 mEq/L  Results for orders placed or performed in visit on 02/04/14 (from the past 8736 hour(s))  Lithium level   Collection Time: 02/26/14 12:01 AM  Result Value Ref Range   Lithium Lvl 1.60 (HH) 0.80 - 1.40 mEq/L   Assessment. Axis I mood disorder NOS, bipolar disorder 1, posttraumatic stress disorder, r/o Major depressive disorder Axis II rule out borderline personality trait. Axis III obesity, chronic headache. Axis IV moderate. Axis V 60-65.  Plan/Discussion: I review her history and previous notes.   She will continue Lamictal at 25 mg twice a day and work up to 100 mg daily over 2 weeks. When she gets to this level she will increase Lamictal to 100 mg twice a day She will continue XanaxXR to 1 mg tid.  More than 50% of the time spent in psychoeducation, counseling and coordination of care. She will return in 4 weeks  MEDICATIONS this encounter: Meds ordered this encounter  Medications  . lamoTRIgine (LAMICTAL) 25 MG tablet    Sig: Take 50 mg by mouth 2 (two) times daily.  Marland Kitchen lamoTRIgine (LAMICTAL) 100 MG tablet    Sig: Take 1 tablet (100 mg total) by mouth 2 (two) times daily.    Dispense:  60 tablet    Refill:  2   Medical Decision Making Problem Points:  Established problem, worsening (2), New problem, with additional work-up planned (4), Review of last therapy session (1)  and Review of psycho-social stressors (1) Data Points:  Review or order clinical lab tests (1) Review of medication regiment & side effects (2) Review of new medications or change in dosage (2)  I certify that outpatient services furnished can reasonably be expected to improve the patient's condition.   Levonne Spiller, MD

## 2015-02-19 NOTE — Progress Notes (Signed)
Patient ID: Teresa Tran, female   DOB: [birth year], 34 y.o.   MRN: [id number] History of Present Illness: Analyse is a 34 year old white female, married in for well woman gyn exam and pap.She complains of low back pain and urinary frequency, both are chronic.She is having EGD in May, ?ulcer.   Current Medications, Allergies, Past Medical History, Past Surgical History, Family History and Social History were reviewed in [employer] record.     Review of Systems: Patient denies any daily headaches(has had 1 laser on eye and  Another this Friday), hearing loss, fatigue, blurred vision, shortness of breath, chest pain, abdominal pain, problems with bowel movements, or intercourse. No joint pain or mood swings.See HPI for positives.   Physical Exam:BP 140/82 mmHg  Pulse 72  Ht 5' (1.524 m)  Wt 195 lb (88.451 kg)  BMI 38.08 kg/m2  LMP 02/05/2015 General:  Well developed, well nourished, no acute distress Skin:  Warm and dry Neck:  Midline trachea, normal thyroid, good ROM, no lymphadenopathy Lungs; Clear to auscultation bilaterally Breast:  No dominant palpable mass, retraction, or nipple discharge Cardiovascular: Regular rate and rhythm Abdomen:  Soft, non tender, no hepatosplenomegaly Pelvic:  External genitalia is normal in appearance, no lesions.  The vagina is normal in appearance. Urethra has no lesions or masses. The cervix is bulbous, pap with HPV performed.  Uterus is felt to be normal shape, and contour, mildly tender.  No adnexal masses, mildly tender in LLQ noted.Bladder is non tender, no masses felt. Extremities/musculoskeletal:  No swelling or varicosities noted, no clubbing or cyanosis Psych:  No mood changes, alert and cooperative,seems happy   Impression: Well woman gyn exam with pap Urinary frequency, chronic Back pain,chronic    Plan: Will try pyridium to see if helps, may be IC, if not may try vesicare for OAB If neither help may get gyn [name]   Physical in 1 year  Labs with PCP F/U with GI

## 2015-02-20 LAB — CYTOLOGY - PAP

## 2015-02-21 ENCOUNTER — Encounter (INDEPENDENT_AMBULATORY_CARE_PROVIDER_SITE_OTHER): Payer: Medicare Other | Admitting: Ophthalmology

## 2015-02-21 DIAGNOSIS — H33302 Unspecified retinal break, left eye: Secondary | ICD-10-CM

## 2015-02-22 DIAGNOSIS — J019 Acute sinusitis, unspecified: Secondary | ICD-10-CM | POA: Diagnosis not present

## 2015-02-22 DIAGNOSIS — H3530 Unspecified macular degeneration: Secondary | ICD-10-CM | POA: Diagnosis not present

## 2015-02-24 ENCOUNTER — Telehealth: Payer: Self-pay | Admitting: *Deleted

## 2015-02-24 DIAGNOSIS — R1032 Left lower quadrant pain: Secondary | ICD-10-CM

## 2015-02-24 NOTE — Telephone Encounter (Signed)
Spoke with pt letting her know her pap was normal. Pt states she is not going to take anything for her overactive bladder. Pt also wants to know if she still needs an [name]. Pt was advised you were out of the office today and said tomorrow would be fine to get back in touch with her. Please call pt at [phone]. Thanks!! [place]

## 2015-02-25 NOTE — Telephone Encounter (Signed)
Pt could not afford meds wil get GYN [name] to assess LLQ tnederness

## 2015-02-26 ENCOUNTER — Telehealth: Payer: Self-pay | Admitting: Adult Health

## 2015-02-26 ENCOUNTER — Ambulatory Visit (INDEPENDENT_AMBULATORY_CARE_PROVIDER_SITE_OTHER): Payer: Medicare Other

## 2015-02-26 DIAGNOSIS — R1032 Left lower quadrant pain: Secondary | ICD-10-CM | POA: Diagnosis not present

## 2015-02-26 DIAGNOSIS — M545 Low back pain: Secondary | ICD-10-CM | POA: Diagnosis not present

## 2015-02-26 DIAGNOSIS — K257 Chronic gastric ulcer without hemorrhage or perforation: Secondary | ICD-10-CM | POA: Diagnosis not present

## 2015-02-26 NOTE — Progress Notes (Signed)
US pelvis, anteverted uterus,subserosal fibroid fundal rt 2.6 x 2.7 x 2.3cm, EEC 1.6cm,normal rt ov,dominate follicle lt ov 2.4 x 1.7 x 2.25cm,pain on lt side during TV ultrasound

## 2015-02-26 NOTE — Telephone Encounter (Signed)
Pt aware of [name] results from today has small fundal fibroid and dom.follicle left ovary

## 2015-02-27 LAB — COMPREHENSIVE METABOLIC PANEL
ALBUMIN: 3.9 g/dL (ref 3.5–5.2)
ALT: 10 U/L (ref 0–35)
AST: 11 U/L (ref 0–37)
Alkaline Phosphatase: 61 U/L (ref 39–117)
BUN: 16 mg/dL (ref 6–23)
CO2: 30 mEq/L (ref 19–32)
Calcium: 9.1 mg/dL (ref 8.4–10.5)
Chloride: 103 mEq/L (ref 96–112)
Creat: 0.74 mg/dL (ref 0.50–1.10)
Glucose, Bld: 85 mg/dL (ref 70–99)
Potassium: 3.9 mEq/L (ref 3.5–5.3)
SODIUM: 141 meq/L (ref 135–145)
Total Bilirubin: 0.3 mg/dL (ref 0.2–1.2)
Total Protein: 6.5 g/dL (ref 6.0–8.3)

## 2015-03-05 ENCOUNTER — Ambulatory Visit (INDEPENDENT_AMBULATORY_CARE_PROVIDER_SITE_OTHER): Payer: Medicare Other | Admitting: Ophthalmology

## 2015-03-17 ENCOUNTER — Ambulatory Visit (INDEPENDENT_AMBULATORY_CARE_PROVIDER_SITE_OTHER): Payer: Medicare Other | Admitting: Ophthalmology

## 2015-03-18 ENCOUNTER — Ambulatory Visit (HOSPITAL_COMMUNITY): Admission: RE | Admit: 2015-03-18 | Payer: Medicare Other | Source: Ambulatory Visit | Admitting: Gastroenterology

## 2015-03-18 ENCOUNTER — Encounter (HOSPITAL_COMMUNITY): Admission: RE | Payer: Self-pay | Source: Ambulatory Visit

## 2015-03-18 ENCOUNTER — Telehealth: Payer: Self-pay

## 2015-03-18 SURGERY — EGD (ESOPHAGOGASTRODUODENOSCOPY)
Anesthesia: Moderate Sedation

## 2015-03-18 NOTE — Telephone Encounter (Signed)
Called pt and LMOM.  

## 2015-03-18 NOTE — Telephone Encounter (Signed)
PLEASE CALL PT. SHE NEEDS TO KNOW, SHE WILL NEED TO BE SEEN IN CLINIC BEFORE SHE CAN [place] ANOTHER EGD WITH OUR PRACTICE. IF SHE IS A NO SHOW OR CANCELS ANOTHER EGD SHE WILL HAVE NO OTHER CHOICE BUT TO DISCHARGE HER FROM [facility].

## 2015-03-18 NOTE — Telephone Encounter (Signed)
Called pt about her EGD she was to have on 03/18/2015. Pt cancelled procedure on 03/17/2015.  Called pt and was unable to leave message. Pt has rescheduled multiple times.  Does pt need another office visit prior to procedure. Please advise

## 2015-03-18 NOTE — Telephone Encounter (Signed)
Error: unable to leave message

## 2015-03-19 ENCOUNTER — Ambulatory Visit (INDEPENDENT_AMBULATORY_CARE_PROVIDER_SITE_OTHER): Payer: Medicare Other | Admitting: Ophthalmology

## 2015-03-19 NOTE — Telephone Encounter (Signed)
Called pt  Unable to leave message

## 2015-03-20 NOTE — Telephone Encounter (Signed)
Called pt again. No answer.  Unable to leave message. Mailbox full.

## 2015-03-20 NOTE — Telephone Encounter (Signed)
Mailed letter regarding scheduling

## 2015-03-21 ENCOUNTER — Ambulatory Visit (HOSPITAL_COMMUNITY): Payer: Self-pay | Admitting: Psychiatry

## 2015-04-10 ENCOUNTER — Encounter: Payer: Self-pay | Admitting: Nurse Practitioner

## 2015-04-10 ENCOUNTER — Ambulatory Visit (INDEPENDENT_AMBULATORY_CARE_PROVIDER_SITE_OTHER): Payer: Medicare Other | Admitting: Nurse Practitioner

## 2015-04-10 ENCOUNTER — Other Ambulatory Visit: Payer: Self-pay

## 2015-04-10 VITALS — BP 125/77 | HR 82 | Temp 98.4°F | Ht 60.0 in | Wt 198.0 lb

## 2015-04-10 DIAGNOSIS — R1310 Dysphagia, unspecified: Secondary | ICD-10-CM

## 2015-04-10 DIAGNOSIS — K259 Gastric ulcer, unspecified as acute or chronic, without hemorrhage or perforation: Secondary | ICD-10-CM

## 2015-04-10 DIAGNOSIS — K257 Chronic gastric ulcer without hemorrhage or perforation: Secondary | ICD-10-CM | POA: Diagnosis not present

## 2015-04-10 DIAGNOSIS — R1314 Dysphagia, pharyngoesophageal phase: Secondary | ICD-10-CM

## 2015-04-10 NOTE — Assessment & Plan Note (Signed)
34 year old female who has missed a couple scheduled procedures to reevaluate her H. pylori gastritis and peptic ulcer disease. She is having some occasional bloating which can become painful. Minimal GERD symptoms typically only happens after eating offending foods. We'll proceed with another scheduled endoscopy.  Proceed with EGD +/- dilation with Dr. Oneida Alar in near future: the risks, benefits, and alternatives have been discussed with the patient in detail. The patient states understanding and desires to proceed.

## 2015-04-10 NOTE — Patient Instructions (Signed)
1. We will schedule your procedure (endoscopy with possible dilation) for you. 2. Continue taking her medications as prescribed. 3. Further recommendations to be based on results of your procedure.

## 2015-04-10 NOTE — Progress Notes (Signed)
Referring Provider: Delphina Cahill, MD Primary Care Physician:  Delphina Cahill, MD Primary GI:  Dr. Oneida Alar  Chief Complaint  Patient presents with  . EGD    HPI:   34 year old female being seen for rescheduling of EGD. Was previously seen on 01/14/2015 to reschedule EGD for follow-up on peptic ulcer disease and H. pylori gastritis that was treated with Pylera. Patient has canceled follow-up EGD a couple of times. Was informed she needed to be seen in the office within 30 days of the procedure.  Today she states she's having increased bloating, occasionally has to lay on a heating pad. Admits minimal GERD symptoms, typically after eating offendig foods. Occasional nausea, denies vomiting. Takes NSAIDs about once a week for back pain. Denies ASA powders. Denies hematochezia and melena. Has a bowel movement daily to every other day, consistent with [facility] Scale 4, denies need for straining. Admits occasional solid food dysphagia, worse with bready foods. Also some pill dysphagia with large and/or chalky pills.No regurgitation. Denies any other upper or lower GI symptoms.  Past Medical History  Diagnosis Date  . Headache(784.0)   . Bipolar disorder   . Depression   . Ulcer   . Vaginal irritation 02/12/2014  . Yeast infection 02/12/2014  . Obesity   . Vaginal discharge 08/15/2014  . BV (bacterial vaginosis) 08/15/2014  . Low back pain 08/15/2014  . Urinary frequency 08/30/2014    Past Surgical History  Procedure Laterality Date  . Tubal ligation    . Cholecystectomy  Feb 2015    Dr. Ladona Horns, [place]  . Esophagogastroduodenoscopy (egd) with esophageal dilation N/A 02/11/2014    Dr. 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 Esophagogastroduodenoscopy N/A 06/11/2014    JYN:WGNF DUODENITIS/MODERATE EROSIVE GASTRICTIS IN ANTRUM/PARTIALLY HEALED ULCERS    Current Outpatient Prescriptions  Medication Sig Dispense Refill  . ALPRAZolam (XANAX XR) 1 MG 24 hr  tablet Take 1 tablet (1 mg total) by mouth 3 (three) times daily. 90 tablet 2  . lamoTRIgine (LAMICTAL) 100 MG tablet Take 1 tablet (100 mg total) by mouth 2 (two) times daily. 60 tablet 2  . lamoTRIgine (LAMICTAL) 25 MG tablet Take 50 mg by mouth 2 (two) times daily.    Marland Kitchen omeprazole (PRILOSEC) 20 MG capsule Take 1 capsule (20 mg total) by mouth daily. 30 capsule 2  . phenazopyridine (PYRIDIUM) 200 MG tablet Take 1 tablet (200 mg total) by mouth 3 (three) times daily as needed for pain. (Patient not taking: Reported on 04/10/2015) 21 tablet 0   No current facility-administered medications for this visit.    Allergies as of 04/10/2015 - Review Complete 02/19/2015  Allergen Reaction Noted  . Risperdal [risperidone] Other (See Comments) 10/11/2012  . Macrobid [nitrofurantoin monohyd macro] Itching 01/09/2014    Family History  Problem Relation Age of Onset  . Depression Father   . Anxiety disorder Father   . Depression Sister   . Anxiety disorder Sister   . Depression Brother   . Anxiety disorder Brother   . ADD / ADHD Neg Hx   . Alcohol abuse Neg Hx   . Drug abuse Neg Hx   . Bipolar disorder Neg Hx   . Dementia Neg Hx   . OCD Neg Hx   . Paranoid behavior Neg Hx   . Schizophrenia Neg Hx   . Seizures Neg Hx   . Sexual abuse Neg Hx   . Physical abuse Neg Hx   . Colon cancer Neg Hx   .  Cancer - Other Paternal Grandfather     prostate  . Kidney disease Mother   . Diabetes Mother   . Cancer Paternal Grandmother     bladder    History   Social History  . Marital Status: Married    Spouse Name: N/A  . Number of Children: N/A  . Years of Education: N/A   Social History Main Topics  . Smoking status: Never Smoker   . Smokeless tobacco: Never Used     Comment: NEVER SMOKED  . Alcohol Use: No  . Drug Use: No  . Sexual Activity: Yes    Birth Control/ Protection: Surgical     Comment: tubal   Other Topics Concern  . None   Social History Narrative    Review of  Systems: 10 point ROS negative except as per HPI.   Physical Exam: BP 125/77 mmHg  Pulse 82  Temp(Src) 98.4 F (36.9 C) (Oral)  Ht 5' (1.524 m)  Wt 198 lb (89.812 kg)  BMI 38.67 kg/m2  LMP 03/13/2015 General:   Alert and oriented. Pleasant and cooperative. Well-nourished and well-developed.  Head:  Normocephalic and atraumatic. Ears:  Normal auditory acuity. Cardiovascular:  S1, S2 present without murmurs appreciated. Normal pulses noted. Extremities without clubbing or edema. Respiratory:  Clear to auscultation bilaterally. No wheezes, rales, or rhonchi. No distress.  Gastrointestinal:  +BS, soft, and non-distended. Mild epigastric TTP noted. No HSM noted. No guarding or rebound. No masses appreciated.  Rectal:  Deferred  Neurologic:  Alert and oriented x4;  grossly normal neurologically. Psych:  Alert and cooperative. Normal mood and affect.    04/10/2015 8:46 AM

## 2015-04-10 NOTE — Assessment & Plan Note (Signed)
Patient here today to be rescheduled for missed appointments for endoscopies follow-up on peptic ulcer disease and H. pylori gastritis which was treated with Pylera. Currently states she is also having solid food dysphagia, worse with breads, and some pill dysphagia with large and/or chalky pills. We'll proceed with planned endoscopy and add on possible dilation as needed. Likely her dysphagia symptoms are due to chronic esophagitis  however cannot rule out stricture, web, or ring.  Proceed with EGD +/- dilation with Dr. Oneida Alar in near future: the risks, benefits, and alternatives have been discussed with the patient in detail. The patient states understanding and desires to proceed.  Patient is on Xanax however last procedure was completed with conscious sedation with no mention of difficult sedation.

## 2015-04-22 ENCOUNTER — Encounter (INDEPENDENT_AMBULATORY_CARE_PROVIDER_SITE_OTHER): Payer: Medicare Other | Admitting: Ophthalmology

## 2015-04-23 ENCOUNTER — Telehealth: Payer: Self-pay | Admitting: Adult Health

## 2015-04-23 NOTE — Telephone Encounter (Signed)
Left message I called 

## 2015-04-23 NOTE — Progress Notes (Signed)
cc'd to pcp 

## 2015-04-24 NOTE — Telephone Encounter (Signed)
Pt had tubal in 2008 by Dr Tarri Glenn in [place] and regrets it, she needs to get op note to see which tubal he performed, she is aware reversal may not be option

## 2015-05-06 ENCOUNTER — Encounter (HOSPITAL_COMMUNITY)
Admission: RE | Disposition: A | Discharge status: Home / Self Care | Payer: Self-pay | Source: Ambulatory Visit | Attending: Gastroenterology

## 2015-05-06 ENCOUNTER — Encounter (HOSPITAL_COMMUNITY): Payer: Self-pay | Admitting: *Deleted

## 2015-05-06 ENCOUNTER — Ambulatory Visit (HOSPITAL_COMMUNITY)
Admission: RE | Admit: 2015-05-06 | Discharge: 2015-05-06 | Disposition: A | Discharge status: Home / Self Care | Payer: Medicare Other | Source: Ambulatory Visit | Attending: Gastroenterology | Admitting: Gastroenterology

## 2015-05-06 DIAGNOSIS — Z79899 Other long term (current) drug therapy: Secondary | ICD-10-CM | POA: Insufficient documentation

## 2015-05-06 DIAGNOSIS — R1013 Epigastric pain: Secondary | ICD-10-CM | POA: Insufficient documentation

## 2015-05-06 DIAGNOSIS — R1314 Dysphagia, pharyngoesophageal phase: Secondary | ICD-10-CM

## 2015-05-06 DIAGNOSIS — R51 Headache: Secondary | ICD-10-CM | POA: Diagnosis not present

## 2015-05-06 DIAGNOSIS — F329 Major depressive disorder, single episode, unspecified: Secondary | ICD-10-CM | POA: Insufficient documentation

## 2015-05-06 DIAGNOSIS — Z881 Allergy status to other antibiotic agents status: Secondary | ICD-10-CM | POA: Insufficient documentation

## 2015-05-06 DIAGNOSIS — Z888 Allergy status to other drugs, medicaments and biological substances status: Secondary | ICD-10-CM | POA: Insufficient documentation

## 2015-05-06 DIAGNOSIS — K2970 Gastritis, unspecified, without bleeding: Secondary | ICD-10-CM | POA: Insufficient documentation

## 2015-05-06 DIAGNOSIS — F319 Bipolar disorder, unspecified: Secondary | ICD-10-CM | POA: Diagnosis not present

## 2015-05-06 DIAGNOSIS — K259 Gastric ulcer, unspecified as acute or chronic, without hemorrhage or perforation: Secondary | ICD-10-CM | POA: Diagnosis not present

## 2015-05-06 DIAGNOSIS — R1310 Dysphagia, unspecified: Secondary | ICD-10-CM | POA: Diagnosis not present

## 2015-05-06 HISTORY — PX: SAVORY DILATION: SHX5439

## 2015-05-06 HISTORY — PX: ESOPHAGOGASTRODUODENOSCOPY: SHX5428

## 2015-05-06 SURGERY — EGD (ESOPHAGOGASTRODUODENOSCOPY)
Anesthesia: Moderate Sedation

## 2015-05-06 MED ORDER — MEPERIDINE HCL 100 MG/ML IJ SOLN
INTRAMUSCULAR | Status: DC | PRN
  Administered 2015-05-06 (×2): 50 mg via INTRAVENOUS

## 2015-05-06 MED ORDER — STERILE WATER FOR IRRIGATION IR SOLN
Status: DC | PRN
  Administered 2015-05-06: 16:00:00

## 2015-05-06 MED ORDER — ONDANSETRON HCL 4 MG/2ML IJ SOLN: INTRAMUSCULAR | Status: DC | PRN

## 2015-05-06 MED ORDER — SODIUM CHLORIDE 0.9 % IV SOLN
INTRAVENOUS | Status: DC
  Administered 2015-05-06: 15:00:00 via INTRAVENOUS

## 2015-05-06 MED ORDER — MEPERIDINE HCL 100 MG/ML IJ SOLN
INTRAMUSCULAR | Status: AC
  Filled 2015-05-06: qty 2

## 2015-05-06 MED ORDER — LIDOCAINE VISCOUS 2 % MT SOLN
OROMUCOSAL | Status: DC | PRN
Start: 2015-05-06 — End: 2015-05-06
  Administered 2015-05-06: 4 mL via OROMUCOSAL

## 2015-05-06 MED ORDER — MIDAZOLAM HCL 5 MG/5ML IJ SOLN
INTRAMUSCULAR | Status: AC
  Filled 2015-05-06: qty 10

## 2015-05-06 MED ORDER — LIDOCAINE VISCOUS 2 % MT SOLN
OROMUCOSAL | Status: AC
  Filled 2015-05-06: qty 15

## 2015-05-06 MED ORDER — MIDAZOLAM HCL 5 MG/5ML IJ SOLN
INTRAMUSCULAR | Status: DC | PRN
  Administered 2015-05-06 (×2): 2 mg via INTRAVENOUS
  Administered 2015-05-06: 1 mg via INTRAVENOUS

## 2015-05-06 MED ORDER — MINERAL OIL PO OIL
TOPICAL_OIL | ORAL | Status: AC
  Filled 2015-05-06: qty 30

## 2015-05-06 NOTE — Op Note (Signed)
[facility] [address] [place], [id number]   ENDOSCOPY PROCEDURE REPORT  PATIENT: Teresa Tran, Teresa Tran  MR#: [id number] BIRTHDATE: 31-Jul-1981 , 33  yrs. old GENDER: female  ENDOSCOPIST: Danie Binder, MD REFFERED ZM:OQHU Hall, M.D.  PROCEDURE DATE:  May 17, 2015 PROCEDURE:   EGD with biopsy and EGD with dilatation over guidewire   INDICATIONS:1.  dyspepsia.   2.  dysphagia. TOOK PYLERA FOR H PYLORI GASTRITIS. STILL USING IBUPROFEN PRN. C/O BLOTING AND ABD PAIN. MEDICATIONS: Demerol 100 mg IV and Versed 5 mg IV TOPICAL ANESTHETIC: Viscous Xylocaine  DESCRIPTION OF PROCEDURE:   After the risks benefits and alternatives of the procedure were thoroughly explained, informed consent was obtained.  The EG-[id number] ([id number])  endoscope was introduced through the mouth and advanced to the second portion of the duodenum. The instrument was slowly withdrawn as the mucosa was carefully examined.  Prior to withdrawal of the scope, the guidwire was placed.  The esophagus was dilated successfully.  The patient was recovered in endoscopy and discharged home in satisfactory condition. Estimated blood loss is zero unless otherwise noted in this procedure report.   ESOPHAGUS: POSSIBLE PROXIMAL ESOPHAGEAL WEB.  COLD BIOPSIES OBTAINED 20 AND 30 CM FROM THE TEETH.  GE JXN 35 CM FROM THE TEETH. STOMACH: Mild non-erosive gastritis (inflammation) was found in the gastric antrum.  Biopsies were taken in the antrum and angularis. DUODENUM: The duodenal mucosa showed no abnormalities in the bulb and second portion of the duodenum.   Dilation was then performed at the gastroesphageal junction and proximal esophagus Dilator: Savary over guidewire Size(s): 15-17 MM Resistance: minimal TO MODERATE. Heme: yes (TRACE)  COMPLICATIONS: There were no immediate complications.  ENDOSCOPIC IMPRESSION: 1.   POSSIBLE PROXIMAL ESOPHAGEAL WEB. 2.   Non-erosive gastritis  RECOMMENDATIONS: CONTINUE YOUR  WEIGHT LOSS EFFORTS. FOLLOW A LOW FAT DIET. OMPERAZOLE 30 MINUTES PRIOR TO YOUR FIRST MEAL. AWAIT BIOPSY . FOLLOW UP IN 3 MOS.  _______________________________ eSignedDanie Binder, MD 05/17/15 4:57 PM   CPT CODES: ICD CODES:  The ICD and CPT codes recommended by this software are interpretations from the data that the clinical staff has captured with the software.  The verification of the translation of this report to the ICD and CPT codes and modifiers is the sole responsibility of the health care institution and practicing physician where this report was generated.  [employer] will not be held responsible for the validity of the ICD and CPT codes included on this report.  AMA assumes no liability for data contained or not contained herein. CPT is a Designer, television/film set of the [employer].

## 2015-05-06 NOTE — Discharge Instructions (Signed)
I dilated your esophagus. 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 You have MILD gastritis. I biopsied your ESOPHAGUS AND stomach.   CONTINUE YOUR WEIGHT LOSS EFFORTS.  FOLLOW A LOW FAT DIET. SEE INFO BELOW.  CONTINUE OMPERAZOLE. TAKE 30 MINUTES PRIOR TO YOUR FIRST MEAL.  YOUR BIOPSY RESULTS WILL BE AVAILABLE IN MY CHART AFTER JUN 30 AND MY OFFICE WILL CONTACT YOU IN 10-14 DAYS WITH YOUR RESULTS.   FOLLOW UP IN 3 MOS.  [phone]   UPPER ENDOSCOPY AFTER CARE Read the instructions outlined below and refer to this sheet in the next week. These discharge instructions provide you with general information on caring for yourself after you leave the hospital. While your treatment has been planned according to the most current medical practices available, unavoidable complications occasionally occur. If you have any problems or questions after discharge, call DR. FIELDS, [phone].  ACTIVITY  You may resume your regular activity, but move at a slower pace for the next 24 hours.   Take frequent rest periods for the next 24 hours.   Walking will help get rid of the air and reduce the bloated feeling in your belly (abdomen).   No driving for 24 hours (because of the medicine (anesthesia) used during the test).   You may shower.   Do not sign any important legal documents or operate any machinery for 24 hours (because of the anesthesia used during the test).    NUTRITION  Drink plenty of fluids.   You may resume your normal diet as instructed by your doctor.   Begin with a light meal and progress to your normal diet. Heavy or fried foods are harder to digest and may make you feel sick to your stomach (nauseated).   Avoid alcoholic beverages for 24 hours or as instructed.    MEDICATIONS  You may resume your normal medications.   WHAT YOU CAN EXPECT TODAY  Some feelings of bloating in the abdomen.   Passage of more gas than usual.    IF YOU HAD A BIOPSY TAKEN  DURING THE UPPER ENDOSCOPY:  Eat a soft diet IF YOU HAVE NAUSEA, BLOATING, ABDOMINAL PAIN, OR VOMITING.    FINDING OUT THE RESULTS OF YOUR TEST Not all test results are available during your visit. DR. Oneida Alar WILL CALL YOU WITHIN 14 DAYS OF YOUR PROCEDUE WITH YOUR RESULTS. Do not assume everything is normal if you have not heard from DR. FIELDS, CALL HER OFFICE AT [phone].  SEEK IMMEDIATE MEDICAL ATTENTION AND CALL THE OFFICE: [phone] IF:  You have more than a spotting of blood in your stool.   Your belly is swollen (abdominal distention).   You are nauseated or vomiting.   You have a temperature over 101F.   You have abdominal pain or discomfort that is severe or gets worse throughout the day.    Low-Fat Diet BREADS, CEREALS, PASTA, RICE, DRIED PEAS, AND BEANS These products are high in carbohydrates and most are low in fat. Therefore, they can be increased in the diet as substitutes for fatty foods. They too, however, contain calories and should not be eaten in excess. Cereals can be eaten for snacks as well as for breakfast.  Include foods that contain fiber (fruits, vegetables, whole grains, and legumes). Research shows that fiber may lower blood cholesterol levels, especially the water-soluble fiber found in fruits, vegetables, oat products, and legumes. FRUITS AND VEGETABLES It is good to eat fruits and vegetables. Besides being sources of fiber, both  are rich in vitamins and some minerals. They help you get the daily allowances of these nutrients. Fruits and vegetables can be used for snacks and desserts. MEATS Limit lean meat, chicken, [place], and fish to no more than 6 ounces per day. Beef, Pork, and Lamb Use lean cuts of beef, pork, and lamb. Lean cuts include:  Extra-lean ground beef.  Arm roast.  Sirloin tip.  Center-cut ham.  Round steak.  Loin chops.  Rump roast.  Tenderloin.  Trim all fat off the outside of meats before cooking. It is not necessary to  severely decrease the intake of red meat, but lean choices should be made. Lean meat is rich in protein and contains a highly absorbable form of iron. Premenopausal women, in particular, should avoid reducing lean red meat because this could increase the risk for low red blood cells (iron-deficiency anemia).  Chicken and [place] These are good sources of protein. The fat of poultry can be reduced by removing the skin and underlying fat layers before cooking. Chicken and [place] can be substituted for lean red meat in the diet. Poultry should not be fried or covered with high-fat sauces. Fish and Shellfish Fish is a good source of protein. Shellfish contain cholesterol, but they usually are low in saturated fatty acids. The preparation of fish is important. Like chicken and [place], they should not be fried or covered with high-fat sauces. EGGS Egg whites contain no fat or cholesterol. They can be eaten often. Try 1 to 2 egg whites instead of whole eggs in recipes or use egg substitutes that do not contain yolk.  MILK AND DAIRY PRODUCTS Use skim or 1% milk instead of 2% or whole milk. Decrease whole milk, natural, and processed cheeses. Use nonfat or low-fat (2%) cottage cheese or low-fat cheeses made from vegetable oils. Choose nonfat or low-fat (1 to 2%) yogurt. Experiment with evaporated skim milk in recipes that call for heavy cream. Substitute low-fat yogurt or low-fat cottage cheese for sour cream in dips and salad dressings. Have at least 2 servings of low-fat dairy products, such as 2 glasses of skim (or 1%) milk each day to help get your daily calcium intake.  FATS AND OILS Butterfat, lard, and beef fats are high in saturated fat and cholesterol. These should be avoided.Vegetable fats do not contain cholesterol. AVOID coconut oil, palm oil, and palm kernel oil, WHICH are very high in saturated fats. These should be limited. These fats are often used in bakery goods, processed foods, popcorn, oils,  and nondairy creamers. Vegetable shortenings and some peanut butters contain hydrogenated oils, which are also saturated fats. Read the labels on these foods and check for saturated vegetable oils.  Desirable liquid vegetable oils are corn oil, cottonseed oil, olive oil, canola oil, safflower oil, soybean oil, and sunflower oil. Peanut oil is not as good, but small amounts are acceptable. Buy a heart-healthy tub margarine that has no partially hydrogenated oils in the ingredients. AVOID Mayonnaise and salad dressings often are made from unsaturated fats.  OTHER EATING TIPS Snacks  Most sweets should be limited as snacks. They tend to be rich in calories and fats, and their caloric content outweighs their nutritional value. Some good choices in snacks are graham crackers, melba toast, soda crackers, bagels (no egg), English muffins, fruits, and vegetables. These snacks are preferable to snack crackers, [place] fries, and chips. Popcorn should be air-popped or cooked in small amounts of liquid vegetable oil.  Desserts Eat fruit, low-fat yogurt, and fruit  ices instead of pastries, cake, and cookies. Sherbet, angel food cake, gelatin dessert, frozen low-fat yogurt, or other frozen products that do not contain saturated fat (pure fruit juice bars, frozen ice pops) are also acceptable.   COOKING METHODS Choose those methods that use little or no fat. They include: Poaching.  Braising.  Steaming.  Grilling.  Baking.  Stir-frying.  Broiling.  Microwaving.  Foods can be cooked in a nonstick pan without added fat, or use a nonfat cooking spray in regular cookware. Limit fried foods and avoid frying in saturated fat. Add moisture to lean meats by using water, broth, cooking wines, and other nonfat or low-fat sauces along with the cooking methods mentioned above. Soups and stews should be chilled after cooking. The fat that forms on top after a few hours in the refrigerator should be skimmed off. When  preparing meals, avoid using excess salt. Salt can contribute to raising blood pressure in some people.  EATING AWAY FROM HOME Order entres, potatoes, and vegetables without sauces or butter. When meat exceeds the size of a deck of cards (3 to 4 ounces), the rest can be taken home for another meal. Choose vegetable or fruit salads and ask for low-calorie salad dressings to be served on the side. Use dressings sparingly. Limit high-fat toppings, such as bacon, crumbled eggs, cheese, sunflower seeds, and olives. Ask for heart-healthy tub margarine instead of butter.   Gastritis  Gastritis is an inflammation (the body's way of reacting to injury and/or infection) of the stomach. It is often caused by viral or bacterial (germ) infections. It can also be caused BY ASPIRIN, BC/GOODY POWDER'S, (IBUPROFEN) MOTRIN, OR ALEVE (NAPROXEN), chemicals (including alcohol), SPICY FOODS, and medications. This illness may be associated with generalized malaise (feeling tired, not well), UPPER ABDOMINAL STOMACH cramps, and fever. One common bacterial cause of gastritis is an organism known as H. Pylori. This can be treated with antibiotics.   GERD   SYMPTOMS Common symptoms of GERD are heartburn (burning in your chest). This is worse when lying down or bending over. It may also cause belching and indigestion. Some of the things which make GERD worse are:  Increased weight pushes on stomach making acid rise more easily.   Smoking markedly increases acid production.   Late evening meals and going to bed with a full stomach increases pressure.   HOME CARE INSTRUCTIONS  Try to achieve and maintain an ideal body weight.   Avoid drinking alcoholic beverages.   DO NOT smokE.   Do not wear tight clothing around your chest or stomach.   Eat smaller meals and eat more frequently. This keeps your stomach from getting too full. Eat slowly.   Do not lie down for 2 or 3 hours after eating. Do not eat or drink  anything 1 to 2 hours before going to bed.   Avoid caffeine beverages (colas, coffee, cocoa, tea), fatty foods, citrus fruits and all other foods and drinks that contain acid and that seem to increase the problems.   Avoid bending over, especially after eating OR STRAINING. Anything that increases the pressure in your belly increases the amount of acid that may be pushed up into your esophagus.    ESOPHAGEAL STRICTURE  Esophageal strictures can be caused by stomach acid backing up into the tube that carries food from the mouth down to the stomach (lower esophagus).  TREATMENT There are a number of medicines used to treat reflux/stricture, including: Antacids.  Proton-pump inhibitors: [place]  INSTRUCTIONS Eat 2-3 hours before going to bed.  Try to reach and maintain a healthy weight.  Do not eat just a few very large meals. Instead, eat 4 TO 6 smaller meals throughout the day.  Try to identify foods and beverages that make your symptoms worse, and avoid these.  Avoid tight clothing.  Do not exercise right after eating.

## 2015-05-06 NOTE — Progress Notes (Signed)
REVIEWED-NO ADDITIONAL RECOMMENDATIONS. 

## 2015-05-06 NOTE — H&P (Signed)
Primary Care Physician:  Delphina Cahill, MD Primary Gastroenterologist:  Dr. Oneida Alar  Pre-Procedure History & Physical: HPI:  Teresa Tran is a 34 y.o. female here for DYSPHAGIA/h pyloi gastritis in 2015.  Past Medical History  Diagnosis Date  . Headache(784.0)   . Bipolar disorder   . Depression   . Ulcer   . Vaginal irritation 02/12/2014  . Yeast infection 02/12/2014  . Obesity   . Vaginal discharge 08/15/2014  . BV (bacterial vaginosis) 08/15/2014  . Low back pain 08/15/2014  . Urinary frequency 08/30/2014    Past Surgical History  Procedure Laterality Date  . Tubal ligation    . Cholecystectomy  Feb 2015    Dr. Ladona Horns, [place]  . Esophagogastroduodenoscopy (egd) with esophageal dilation N/A 02/11/2014    Dr. 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 Authorizing Provider  ALPRAZolam (XANAX XR) 1 MG 24 hr tablet Take 1 tablet (1 mg total) by mouth 3 (three) times daily. Patient not taking: Reported on 04/28/2015 01/22/15   Cloria Spring, MD  lamoTRIgine (LAMICTAL) 100 MG tablet Take 1 tablet (100 mg total) by mouth 2 (two) times daily. Patient not taking: Reported on 04/28/2015 02/19/15 02/19/16  Cloria Spring, MD  omeprazole (PRILOSEC) 20 MG capsule Take 1 capsule (20 mg total) by mouth daily. Patient not taking: Reported on 04/28/2015 01/17/15   Carlis Stable, NP  phenazopyridine (PYRIDIUM) 200 MG tablet Take 1 tablet (200 mg total) by mouth 3 (three) times daily as needed for pain. Patient not taking: Reported on 04/10/2015 02/19/15   Estill Dooms, NP    Allergies as of 04/10/2015 - Review Complete 04/10/2015  Allergen Reaction Noted  . Risperdal [risperidone] Other (See Comments) 10/11/2012  . Macrobid [nitrofurantoin monohyd macro] Itching  01/09/2014    Family History  Problem Relation Age of Onset  . Depression Father   . Anxiety disorder Father   . Depression Sister   . Anxiety disorder Sister   . Depression Brother   . Anxiety disorder Brother   . ADD / ADHD Neg Hx   . Alcohol abuse Neg Hx   . Drug abuse Neg Hx   . Bipolar disorder Neg Hx   . Dementia Neg Hx   . OCD Neg Hx   . Paranoid behavior Neg Hx   . Schizophrenia Neg Hx   . Seizures Neg Hx   . Sexual abuse Neg Hx   . Physical abuse Neg Hx   . Colon cancer Neg Hx   . Cancer - Other Paternal Grandfather     prostate  . Kidney disease Mother   . Diabetes Mother   . Cancer Paternal Grandmother     bladder    History   Social History  . Marital Status: Married    Spouse Name: N/A  . Number of Children: N/A  . Years of Education: N/A   Occupational History  . Not on file.   Social History Main Topics  . Smoking status: Never Smoker   . Smokeless tobacco: Never Used     Comment: NEVER SMOKED  . Alcohol Use: No  . Drug Use: No  . Sexual Activity: Yes    Birth Control/ Protection: Surgical     Comment: tubal   Other Topics Concern  . Not on  file   Social History Narrative    Review of Systems: See HPI, otherwise negative ROS   Physical Exam: BP 120/80 mmHg  Pulse 71  Temp(Src) 98.1 F (36.7 C) (Oral)  Resp 20  Ht 5' (1.524 m)  Wt 190 lb (86.183 kg)  BMI 37.11 kg/m2  SpO2 98%  LMP 03/13/2015 General:   Alert,  pleasant and cooperative in NAD Head:  Normocephalic and atraumatic. Neck:  Supple; Lungs:  Clear throughout to auscultation.    Heart:  Regular rate and rhythm. Abdomen:  Soft, nontender and nondistended. Normal bowel sounds, without guarding, and without rebound.   Neurologic:  Alert and  oriented x4;  grossly normal neurologically.  Impression/Plan:     DYSPHAGIA  PLAN:  EGD/DIL TODAY

## 2015-05-07 ENCOUNTER — Telehealth: Payer: Self-pay

## 2015-05-07 ENCOUNTER — Encounter (HOSPITAL_COMMUNITY): Payer: Self-pay | Admitting: Gastroenterology

## 2015-05-07 MED ORDER — LIDOCAINE VISCOUS 2 % MT SOLN: OROMUCOSAL | Status: DC

## 2015-05-07 NOTE — Progress Notes (Signed)
Gastric biopsy taken for Clo Test during EGD. Sample to be sent out for testing and not done here at the lab at hospital. Specimen was labeled by Dr. Oneida Alar and she gave me a box and pre-labeled Fed-Ex bag to mail it off in. Specimen ok to be sent out tomorrow 05/07/15 because Materials department has already left for the day. Angie Fava, RN to give specimen to [facility] in [facility] tomorrow morning to send out.

## 2015-05-07 NOTE — Telephone Encounter (Signed)
Pt left Vm asking if I have heard from Dr. Oneida Alar yet.

## 2015-05-07 NOTE — Telephone Encounter (Addendum)
Called patient TO DISCUSS CONCERNS. THROAT AND CHEST Hurts when she swallows. SHE HAS TWO CHOICES USE SALT WATER GARGLES TID AND VISCOUS LIDOCAINE Q4H FOR THROAT PAIN. FOLLOW A FULL LIQUID DIET FOR 3 DAYS. Pt has internet access and will PRINT off handout FROM [email or web address].    IF NOT IMPROVED BY [facility] AM , PT WILL NEED GASTROGRAFFIN SWALLOW. PT INSTRUCTED TO GO TO ED FOR FEVER, CHILLS,PRODUCTIVE COUGH, WORSENING CHEST PAIN.

## 2015-05-07 NOTE — Telephone Encounter (Signed)
Pt called said she had EGD done yesterday and she is having a lot of throat pain and it is more difficulty swallowing. Please advise!

## 2015-05-09 ENCOUNTER — Ambulatory Visit (HOSPITAL_COMMUNITY)
Admission: RE | Admit: 2015-05-09 | Discharge: 2015-05-09 | Disposition: A | Discharge status: Home / Self Care | Payer: Medicare Other | Source: Ambulatory Visit | Attending: Gastroenterology | Admitting: Gastroenterology

## 2015-05-09 ENCOUNTER — Telehealth: Payer: Self-pay | Admitting: *Deleted

## 2015-05-09 DIAGNOSIS — R1310 Dysphagia, unspecified: Secondary | ICD-10-CM

## 2015-05-09 NOTE — Telephone Encounter (Signed)
Pt called stating she is not any better and Dr. Oneida Alar told her to call Friday if she wasn't, pt states she is still not better, pt is requesting to speak with Tamela Oddi. Please advise [phone]

## 2015-05-09 NOTE — Telephone Encounter (Signed)
CONTINUES WITH THROAT PAIN AND PAIN WITH SWALLOWING. GASTROGRAFFIN SWALLOW STAT TODAY. DISCUSSED WITH DR. Thornton Papas AND [facility]: NANCY.

## 2015-05-10 MED ORDER — HYDROCODONE-ACETAMINOPHEN 5-325 MG PO TABS: ORAL_TABLET | ORAL | Status: DC

## 2015-05-10 NOTE — Telephone Encounter (Signed)
TRIED TO CALL [facility] BUT UNABLE TO CALL IN. OFFERED TO HAVE PT PICK UP Rx AT [facility] ED REGISTRATION. PT HAS [facility] FROM WHEN SHE HAD A TOOTH PULLED.

## 2015-05-10 NOTE — Telephone Encounter (Addendum)
Spoke to pt JUL 1 @2000 .GGS no perforation. PT DECLINED BUT NO GI COVERAGE THIS WEEKEND SO RX WILL BE AVAILABLE IF SHE CHANGES HER MIND. CONTINUE VISC LIDOCAINE. CALL JUL 5 IF PAIN NOT IMPROVED. PO INTAKE AS TOLERATED.

## 2015-05-10 NOTE — Addendum Note (Signed)
Addended by: Danie Binder on: 05/10/2015 11:16 AM   Modules accepted: Orders

## 2015-05-28 ENCOUNTER — Telehealth: Payer: Self-pay | Admitting: Gastroenterology

## 2015-05-28 NOTE — Telephone Encounter (Signed)
PLEASE CALL PT. HER ESOPHAGUS BIOPSIES ARE NORMAL.   CONTINUE YOUR WEIGHT LOSS EFFORTS.  FOLLOW A LOW FAT DIET.   CONTINUE OMPERAZOLE. TAKE 30 MINUTES PRIOR TO YOUR FIRST MEAL.  FOLLOW UP IN 3 MOS W/ SLF E30 DYSPHAGIA/PUD.

## 2015-05-28 NOTE — Telephone Encounter (Signed)
Pt is aware of results. 

## 2015-05-28 NOTE — Telephone Encounter (Signed)
REMINDER IN EPIC °

## 2015-06-03 ENCOUNTER — Encounter (INDEPENDENT_AMBULATORY_CARE_PROVIDER_SITE_OTHER): Payer: Medicare Other | Admitting: Ophthalmology

## 2015-06-16 ENCOUNTER — Ambulatory Visit (HOSPITAL_COMMUNITY): Payer: Self-pay | Admitting: Psychiatry

## 2015-06-16 ENCOUNTER — Telehealth (HOSPITAL_COMMUNITY): Payer: Self-pay | Admitting: *Deleted

## 2015-06-16 ENCOUNTER — Encounter (HOSPITAL_COMMUNITY): Payer: Self-pay | Admitting: Psychiatry

## 2015-06-19 ENCOUNTER — Ambulatory Visit (INDEPENDENT_AMBULATORY_CARE_PROVIDER_SITE_OTHER): Payer: Medicare Other | Admitting: Psychiatry

## 2015-06-19 ENCOUNTER — Encounter (HOSPITAL_COMMUNITY): Payer: Self-pay | Admitting: Psychiatry

## 2015-06-19 VITALS — BP 124/60 | HR 101 | Ht 60.0 in | Wt 202.0 lb

## 2015-06-19 DIAGNOSIS — F39 Unspecified mood [affective] disorder: Secondary | ICD-10-CM

## 2015-06-19 DIAGNOSIS — F3162 Bipolar disorder, current episode mixed, moderate: Secondary | ICD-10-CM

## 2015-06-19 DIAGNOSIS — F4310 Post-traumatic stress disorder, unspecified: Secondary | ICD-10-CM

## 2015-06-19 DIAGNOSIS — F319 Bipolar disorder, unspecified: Secondary | ICD-10-CM | POA: Diagnosis not present

## 2015-06-19 MED ORDER — BUSPIRONE HCL 5 MG PO TABS: 5.0000 mg | ORAL_TABLET | Freq: Three times a day (TID) | ORAL | Status: DC

## 2015-06-19 NOTE — Progress Notes (Signed)
Patient ID: Teresa Tran, female   DOB: 1981/02/08, 34 y.o.   MRN: [id number] Patient ID: Teresa Tran, female   DOB: [birth year], 34 y.o.   MRN: [id number] Patient ID: Teresa Tran, female   DOB: 1981/07/02, 34 y.o.   MRN: [id number] Patient ID: Teresa Tran, female   DOB: [birth year], 34 y.o.   MRN: [id number] Patient ID: Teresa Tran, female   DOB: 1981/08/09, 34 y.o.   MRN: [id number] Patient ID: Teresa Tran, female   DOB: 12/09/1980, 34 y.o.   MRN: [id number] Patient ID: Teresa Tran, female   DOB: 08-28-1981, 34 y.o.   MRN: [id number] Patient ID: Teresa Tran, female   DOB: 1981-05-24, 34 y.o.   MRN: [id number] Patient ID: Teresa Tran, female   DOB: 1981/09/23, 34 y.o.   MRN: [id number] Patient ID: Teresa Tran, female   DOB: Feb 07, 1981, 34 y.o.   MRN: [id number] Patient ID: Teresa Tran, female   DOB: 1981/06/11, 34 y.o.   MRN: [id number] Patient ID: Teresa Tran, female   DOB: 01/05/1981, 34 y.o.   MRN: [id number] Patient ID: Teresa Tran, female   DOB: 1980-12-01, 34 y.o.   MRN: [id number] Patient ID: Teresa Tran, female   DOB: 1981-02-13, 34 y.o.   MRN: [id number] Patient ID: Teresa Tran, female   DOB: January 23, 1981, 34 y.o.   MRN: [id number] Patient ID: Teresa Tran, female   DOB: 07-21-1981, 34 y.o.   MRN: [id number] [place] [address] Progress Note Teresa Tran MRN: [id number] DOB: Aug 23, 1981 Age: 34 y.o.  Date: 06/19/2015  Chief Complaint: Chief Complaint  Patient presents with  . Anxiety  . 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  The patient returns after  4 months. She states that she is doing very well overall. She has stopped all the psychiatric drugs and is spending a lot of time in a church and involved in many activities there. She claims that she is much more positive in her thinking  now. However she still has a lot of scattered thoughts distractibility and anxiety. She denies having ADD symptoms as a child and thinks they're more related to anxiety. She would like to try something to bring this down a bit but doesn't want anything addictive like benzodiazepines. I suggested we try BuSpar. She is no longer cutting herself .Marland Kitchen Past psychiatric history. Patient has history of significant depression and mood swings. She has at least 4 psychiatric admissions in past.  In 2000-2003 she was admitted due to cutting herself and suicidal thinking. In the past. She had tried Geodon, Depakote, Lamictal, Prozac, Abilify, Lexapro, Celexa, Effexor, Wellbutrin, BuSpar, Paxil, Risperdal, Ativan and Klonopin. She admitted history of anger, severe mood swings, and irritability.  Her last psychiatric admission was in June 2013.  Psychosocial history. Patient was born and lived in [place]. She's been married twice. She has one son from her previous marriage and a daughter from her second marriage.    Allergies: Allergies  Allergen Reactions  . Risperdal [Risperidone] Other (See Comments)    Wt gain  . Macrobid [Nitrofurantoin Monohyd Macro] Itching    Medical History: Past Medical History  Diagnosis Date  . Headache(784.0)   . Bipolar  disorder   . Depression   . Ulcer   . Vaginal irritation 02/12/2014  . Yeast infection 02/12/2014  . Obesity   . Vaginal discharge 08/15/2014  . BV (bacterial vaginosis) 08/15/2014  . Low back pain 08/15/2014  . Urinary frequency 08/30/2014  Patient has obesity, and chronic migraine headache. . She is taking Imitrex as needed for headache.  Her blood work in March 2013 showed hemoglobin A1c is 6 however her liver enzymes and kidney functions are normal.  Her primary care physician is [facility] internal medicine.    Surgical History: Past Surgical History  Procedure Laterality Date  . Tubal ligation    . Cholecystectomy  Feb 2015    Dr. Ladona Horns, [place]   . Esophagogastroduodenoscopy (egd) with esophageal dilation N/A 02/11/2014    Dr. 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 Esophagogastroduodenoscopy N/A 06/11/2014    BUL:AGTX DUODENITIS/MODERATE EROSIVE GASTRICTIS IN ANTRUM/PARTIALLY HEALED ULCERS  . Esophagogastroduodenoscopy N/A 05/06/2015    Procedure: ESOPHAGOGASTRODUODENOSCOPY (EGD);  Surgeon: Danie Binder, MD;  [facility];  Service: Endoscopy;  Laterality: N/A;  1300 - moved to 1:15 - office to notify  . Savory dilation N/A 05/06/2015    Procedure: SAVORY DILATION;  Surgeon: Danie Binder, MD;  Location: [facility];  Service: Endoscopy;  Laterality: N/A;   Family history. Patient endorsed significant history of depression anxiety in her family. Her brother, sister and father has depression and anxiety. family history includes Anxiety disorder in her brother, father, and sister; Cancer in her paternal grandmother; Cancer - Other in her paternal grandfather; Depression in her brother, father, and sister; Diabetes in her mother; Kidney disease in her mother. There is no history of ADD / ADHD, Alcohol abuse, Drug abuse, Bipolar disorder, Dementia, OCD, Paranoid behavior, Schizophrenia, Seizures, Sexual abuse, Physical abuse, or Colon cancer. Reviewed all this again today in the session and nothing has changed.  Education background and work history. She has bachelor degree in Programmer, applications.  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 She quit her job in December 2012 .  She worked briefly at [employer] but quit her job due to feeling overwhelmed.  She recently worked at [employer] as a Scientist, water quality for 2 weeks only.    Alcohol and substance use history. Patient denies any history of alcohol or substance use. She denies a history of DWI.  She has used Xanax for two years and is not willing to stop this for Neurontin for her anxiety despite noting memory loss with  the use of Xanax.  Filed Vitals:   06/19/15 1625  BP: 124/60  Pulse: 101    Mental status examination. Patient is a somewhat overweight white female, who is nicely dressed She is anxious but cooperative.  She described her mood as anxious and her affect is congruent She maintained fair eye contact.  She denies any active or passive suicidal thoughts or homicidal thoughts.  She denies any auditory hallucination or visual hallucination.  She denies paranoid ideation. She she claims that her thoughts are racing but her speech is clear coherent and logical There were no flight of ideas or loose association .  Her attention and concentration is fair.  She's alert and oriented x3. Her insight judgment and impulse control is fair.  Lab Results:  Results for orders placed or performed in visit on 02/19/15 (from the past 8736 hour(s))  Cytology - PAP   Collection Time: 02/19/15 12:00  AM  Result Value Ref Range   CYTOLOGY - PAP PAP RESULT   Results for orders placed or performed in visit on 01/14/15 (from the past 8736 hour(s))  Comp Met (CMET)   Collection Time: 02/26/15  9:13 AM  Result Value Ref Range   Sodium 141 135 - 145 mEq/L   Potassium 3.9 3.5 - 5.3 mEq/L   Chloride 103 96 - 112 mEq/L   CO2 30 19 - 32 mEq/L   Glucose, Bld 85 70 - 99 mg/dL   BUN 16 6 - 23 mg/dL   Creat 0.74 0.50 - 1.10 mg/dL   Total Bilirubin 0.3 0.2 - 1.2 mg/dL   Alkaline Phosphatase 61 39 - 117 U/L   AST 11 0 - 37 U/L   ALT 10 0 - 35 U/L   Total Protein 6.5 6.0 - 8.3 g/dL   Albumin 3.9 3.5 - 5.2 g/dL   Calcium 9.1 8.4 - 10.5 mg/dL  Results for orders placed or performed in visit on 11/18/14 (from the past 8736 hour(s))  CBC w/Diff   Collection Time: 11/21/14 11:42 AM  Result Value Ref Range   WBC 5.9 4.0 - 10.5 K/uL   RBC 4.50 3.87 - 5.11 MIL/uL   Hemoglobin 12.6 12.0 - 15.0 g/dL   HCT 37.9 36.0 - 46.0 %   MCV 84.2 78.0 - 100.0 fL   MCH 28.0 26.0 - 34.0 pg   MCHC 33.2 30.0 - 36.0 g/dL   RDW 14.1 11.5 -  15.5 %   Platelets 231 150 - 400 K/uL   MPV 10.5 8.6 - 12.4 fL   Neutrophils Relative % 63 43 - 77 %   Neutro Abs 3.7 1.7 - 7.7 K/uL   Lymphocytes Relative 31 12 - 46 %   Lymphs Abs 1.8 0.7 - 4.0 K/uL   Monocytes Relative 5 3 - 12 %   Monocytes Absolute 0.3 0.1 - 1.0 K/uL   Eosinophils Relative 1 0 - 5 %   Eosinophils Absolute 0.1 0.0 - 0.7 K/uL   Basophils Relative 0 0 - 1 %   Basophils Absolute 0.0 0.0 - 0.1 K/uL   Smear Review Criteria for review not met   Comp Met (CMET)   Collection Time: 11/21/14 11:42 AM  Result Value Ref Range   Sodium 140 135 - 145 mEq/L   Potassium 4.3 3.5 - 5.3 mEq/L   Chloride 105 96 - 112 mEq/L   CO2 28 19 - 32 mEq/L   Glucose, Bld 93 70 - 99 mg/dL   BUN 12 6 - 23 mg/dL   Creat 0.58 0.50 - 1.10 mg/dL   Total Bilirubin 0.4 0.2 - 1.2 mg/dL   Alkaline Phosphatase 66 39 - 117 U/L   AST 17 0 - 37 U/L   ALT 16 0 - 35 U/L   Total Protein 6.7 6.0 - 8.3 g/dL   Albumin 3.9 3.5 - 5.2 g/dL   Calcium 9.4 8.4 - 10.5 mg/dL  Results for orders placed or performed in visit on 08/30/14 (from the past 8736 hour(s))  POCT Urinalysis Dipstick   Collection Time: 08/30/14  9:50 AM  Result Value Ref Range   Color, UA yellow    Clarity, UA cloudy    Glucose, UA neg    Bilirubin, UA     Ketones, UA     Spec Grav, UA     Blood, UA neg    pH, UA     Protein, UA neg    Urobilinogen, UA  Nitrite, UA neg    Leukocytes, UA Negative   Urine culture   Collection Time: 08/30/14  9:56 AM  Result Value Ref Range   Colony Count 2,000 COLONIES/ML    Organism ID, Bacteria Insignificant Growth   Urinalysis   Collection Time: 08/30/14  9:56 AM  Result Value Ref Range   Color, Urine YELLOW YELLOW   APPearance CLEAR CLEAR   Specific Gravity, Urine 1.023 1.005 - 1.030   pH 6.0 5.0 - 8.0   Glucose, UA NEG NEG mg/dL   Bilirubin Urine NEG NEG   Ketones, ur NEG NEG mg/dL   Hgb urine dipstick NEG NEG   Protein, ur NEG NEG mg/dL   Urobilinogen, UA 0.2 0.0 - 1.0 mg/dL    Nitrite NEG NEG   Leukocytes, UA NEG NEG  Results for orders placed or performed in visit on 08/15/14 (from the past 8736 hour(s))  POCT Urinalysis Dipstick   Collection Time: 08/15/14 11:36 AM  Result Value Ref Range   Color, UA yellow    Clarity, UA     Glucose, UA neg    Bilirubin, UA     Ketones, UA     Spec Grav, UA     Blood, UA neg    pH, UA     Protein, UA neg    Urobilinogen, UA     Nitrite, UA neg    Leukocytes, UA Negative   POCT Wet Prep [place])   Collection Time: 08/15/14 11:36 AM  Result Value Ref Range   Source Wet Prep POC     WBC, Wet Prep HPF POC pos    Bacteria Wet Prep HPF POC     BACTERIA WET PREP MORPHOLOGY POC     Clue Cells Wet Prep HPF POC Moderate    Clue Cells Wet Prep Whiff POC     Yeast Wet Prep HPF POC     KOH Wet Prep POC     Trichomonas Wet Prep HPF POC    Urine culture   Collection Time: 08/15/14 11:53 AM  Result Value Ref Range   Colony Count NO GROWTH    Organism ID, Bacteria NO GROWTH   Urinalysis   Collection Time: 08/15/14 11:53 AM  Result Value Ref Range   Color, Urine YELLOW YELLOW   APPearance CLEAR CLEAR   Specific Gravity, Urine 1.024 1.005 - 1.030   pH 5.0 5.0 - 8.0   Glucose, UA NEG NEG mg/dL   Bilirubin Urine NEG NEG   Ketones, ur NEG NEG mg/dL   Hgb urine dipstick NEG NEG   Protein, ur NEG NEG mg/dL   Urobilinogen, UA 0.2 0.0 - 1.0 mg/dL   Nitrite NEG NEG   Leukocytes, UA NEG NEG  Results for orders placed or performed in visit on 07/05/14 (from the past 8736 hour(s))  Lithium level   Collection Time: 08/22/14  8:47 AM  Result Value Ref Range   Lithium Lvl 0.80 0.80 - 1.40 mEq/L   Assessment. Axis I mood disorder NOS, bipolar disorder 1, posttraumatic stress disorder, r/o Major depressive disorder Axis II rule out borderline personality trait. Axis III obesity, chronic headache. Axis IV moderate. Axis V 60-65.  Plan/Discussion: I review her history and previous notes.   She will start BuSpar 5 mg 3  times a day and return to see me in 4 weeks.  More than 50% of the time spent in psychoeducation, counseling and coordination of care.  MEDICATIONS this encounter: Meds ordered this encounter  Medications  .  busPIRone (BUSPAR) 5 MG tablet    Sig: Take 1 tablet (5 mg total) by mouth 3 (three) times daily.    Dispense:  90 tablet    Refill:  2   Medical Decision Making Problem Points:  Established problem, worsening (2), New problem, with additional work-up planned (4), Review of last therapy session (1) and Review of psycho-social stressors (1) Data Points:  Review or order clinical lab tests (1) Review of medication regiment & side effects (2) Review of new medications or change in dosage (2)  I certify that outpatient services furnished can reasonably be expected to improve the patient's condition.   Levonne Spiller, MD

## 2015-06-24 NOTE — Progress Notes (Signed)
REVIEWED-NO ADDITIONAL RECOMMENDATIONS. 

## 2015-07-07 ENCOUNTER — Encounter: Payer: Self-pay | Admitting: Nutrition

## 2015-07-07 ENCOUNTER — Encounter: Payer: Medicare Other | Attending: Internal Medicine | Admitting: Nutrition

## 2015-07-07 DIAGNOSIS — E669 Obesity, unspecified: Secondary | ICD-10-CM | POA: Diagnosis not present

## 2015-07-07 DIAGNOSIS — R079 Chest pain, unspecified: Secondary | ICD-10-CM | POA: Diagnosis not present

## 2015-07-07 DIAGNOSIS — F319 Bipolar disorder, unspecified: Secondary | ICD-10-CM | POA: Diagnosis not present

## 2015-07-07 DIAGNOSIS — R0789 Other chest pain: Secondary | ICD-10-CM | POA: Diagnosis not present

## 2015-07-07 DIAGNOSIS — R739 Hyperglycemia, unspecified: Secondary | ICD-10-CM

## 2015-07-07 DIAGNOSIS — R0602 Shortness of breath: Secondary | ICD-10-CM | POA: Diagnosis not present

## 2015-07-07 DIAGNOSIS — Z79899 Other long term (current) drug therapy: Secondary | ICD-10-CM | POA: Diagnosis not present

## 2015-07-07 NOTE — Patient Instructions (Signed)
Goals 1. Follow My Plate Method 2. Eat three meals a day and not skip meals. 3. Walk for 30 minutes 5 days per week. 4. Cut out snacks between meals 5. Drink only water. 6.Lose 5 lbs in 1 month. 7. Keep A1C below 6%.

## 2015-07-07 NOTE — Progress Notes (Signed)
  Medical Nutrition Therapy:  Appt start time: 1100end time:  1130.  Assessment:  Primary concerns today: Prediabetes. A1C was 6% in January 2016. Gianed.13 lbs since last cist. A1C up to 6^. Complains of headaches and weakness when she doesn't eat and nausea. Physical Activity: started a dance class once a week. Wants 30 minutes. She drinks water and doesn't drink sodas. Eats snacks and skips breakfast and  Has eaten out more recently. Admiits to emotionally eating.   . Lives with husband. Has three kids. Stays at home. Has lost 30 lbs in the 10 months. Walks for activity and goes to the [facility] a few times per week. She does the shopping  And does the cooking. Most foods are baked/broiled. Eats out 1 every 2 weeks.   Preferred Learning Style:   No preference indicated   Learning Readiness:     Ready  Change in progress   MEDICATIONS: See list   DIETARY INTAKE:  24-hr recall:  B ( AM): skips Snk ( AM):   L ( PM): skipped due to church but had some chips and salsa. Snk ( PM): pack of nabs, water D ( PM):Hamburger with bun cheese, kup and mustard and potato chips, Sweet tea. Snk ( PM): sometimes a pack of nabs, fruit Beverages: water crystal light., water  Usual physical activity: walking and working out at the [employer] needs: 1200 calories 135 g carbohydrates 90 g protein 33 g fat  Progress Towards Goal(s):  In progress.   Nutritional Diagnosis:  NB-1.1 Food and nutrition-related knowledge deficit As related to Prediabetes and being overweight.  As evidenced by A1C of 5.8% and BMI >30.Marland Kitchen    Intervention:  Nutrition counseling and healthy weight loss.. Plan:  Aim for 2-3 Carb Choices per meal (30-45 grams) +/- 1 either way  Include protein in moderation with your meals and snacks Consider reading food labels for Total Carbohydrate  Consider  increasing your activity level by 30 minutes daily as tolerated Continue taking medication as directed by MD Goal:  1. Lose 1 lb per week. 2. Eat breakfast daily. 3. No snacks between meals. 4. Get A1C below 5.7% in 3-6 months. 5. Keep a food journal.  Teaching Method Utilized:  Visual Auditory Hands on  Handouts given during visit include: Carb Counting and Food Label handouts Meal Plan Card My Plate  Barriers to learning/adherence to lifestyle change: none  Demonstrated degree of understanding via:  Teach Back   Monitoring/Evaluation:  Dietary intake, exercise, meal planning, and body weight in 6 weeks.

## 2015-07-18 DIAGNOSIS — R079 Chest pain, unspecified: Secondary | ICD-10-CM | POA: Diagnosis not present

## 2015-07-18 DIAGNOSIS — F319 Bipolar disorder, unspecified: Secondary | ICD-10-CM | POA: Diagnosis not present

## 2015-07-21 ENCOUNTER — Encounter: Payer: Self-pay | Admitting: Gastroenterology

## 2015-07-22 ENCOUNTER — Ambulatory Visit (HOSPITAL_COMMUNITY): Payer: Self-pay | Admitting: Psychiatry

## 2015-07-23 ENCOUNTER — Encounter (INDEPENDENT_AMBULATORY_CARE_PROVIDER_SITE_OTHER): Payer: Medicare Other | Admitting: Ophthalmology

## 2015-08-01 ENCOUNTER — Ambulatory Visit: Payer: Self-pay | Admitting: Nutrition

## 2015-08-05 DIAGNOSIS — R7301 Impaired fasting glucose: Secondary | ICD-10-CM | POA: Diagnosis not present

## 2015-08-08 DIAGNOSIS — R7301 Impaired fasting glucose: Secondary | ICD-10-CM | POA: Diagnosis not present

## 2015-08-08 DIAGNOSIS — K219 Gastro-esophageal reflux disease without esophagitis: Secondary | ICD-10-CM | POA: Diagnosis not present

## 2015-08-08 DIAGNOSIS — F319 Bipolar disorder, unspecified: Secondary | ICD-10-CM | POA: Diagnosis not present

## 2015-08-08 DIAGNOSIS — E6609 Other obesity due to excess calories: Secondary | ICD-10-CM | POA: Diagnosis not present

## 2015-09-05 ENCOUNTER — Ambulatory Visit: Payer: Self-pay | Admitting: Nutrition

## 2015-09-13 DIAGNOSIS — Z79899 Other long term (current) drug therapy: Secondary | ICD-10-CM | POA: Diagnosis not present

## 2015-09-13 DIAGNOSIS — J069 Acute upper respiratory infection, unspecified: Secondary | ICD-10-CM | POA: Diagnosis not present

## 2015-09-13 DIAGNOSIS — F319 Bipolar disorder, unspecified: Secondary | ICD-10-CM | POA: Diagnosis not present

## 2015-09-13 DIAGNOSIS — R0602 Shortness of breath: Secondary | ICD-10-CM | POA: Diagnosis not present

## 2015-09-13 DIAGNOSIS — R05 Cough: Secondary | ICD-10-CM | POA: Diagnosis not present

## 2015-10-08 DIAGNOSIS — R51 Headache: Secondary | ICD-10-CM | POA: Diagnosis not present

## 2015-10-08 DIAGNOSIS — J309 Allergic rhinitis, unspecified: Secondary | ICD-10-CM | POA: Diagnosis not present

## 2015-10-08 DIAGNOSIS — R05 Cough: Secondary | ICD-10-CM | POA: Diagnosis not present

## 2015-10-20 ENCOUNTER — Telehealth: Payer: Self-pay | Admitting: Nutrition

## 2015-10-20 ENCOUNTER — Ambulatory Visit: Payer: Self-pay | Admitting: Nutrition

## 2015-10-20 NOTE — Telephone Encounter (Signed)
Attempted to call and reschedule missed appt. VM full and unable to leave message. PC

## 2015-11-05 DIAGNOSIS — R12 Heartburn: Secondary | ICD-10-CM | POA: Diagnosis not present

## 2015-11-05 DIAGNOSIS — R05 Cough: Secondary | ICD-10-CM | POA: Diagnosis not present

## 2015-11-15 NOTE — Progress Notes (Signed)
Quick Note:  No further work up of liver hemangioma. Stable for over one year. Patient seen multiple times since initial documentation. ______

## 2015-11-17 DIAGNOSIS — Z23 Encounter for immunization: Secondary | ICD-10-CM | POA: Diagnosis not present

## 2015-11-28 DIAGNOSIS — I7300 Raynaud's syndrome without gangrene: Secondary | ICD-10-CM | POA: Diagnosis not present

## 2015-11-28 DIAGNOSIS — Z79899 Other long term (current) drug therapy: Secondary | ICD-10-CM | POA: Diagnosis not present

## 2015-11-28 DIAGNOSIS — R05 Cough: Secondary | ICD-10-CM | POA: Diagnosis not present

## 2015-11-28 DIAGNOSIS — F419 Anxiety disorder, unspecified: Secondary | ICD-10-CM | POA: Diagnosis not present

## 2015-12-01 DIAGNOSIS — G629 Polyneuropathy, unspecified: Secondary | ICD-10-CM | POA: Diagnosis not present

## 2015-12-01 DIAGNOSIS — R05 Cough: Secondary | ICD-10-CM | POA: Diagnosis not present

## 2015-12-01 DIAGNOSIS — I7300 Raynaud's syndrome without gangrene: Secondary | ICD-10-CM | POA: Diagnosis not present

## 2015-12-02 DIAGNOSIS — I7300 Raynaud's syndrome without gangrene: Secondary | ICD-10-CM | POA: Diagnosis not present

## 2015-12-02 DIAGNOSIS — E039 Hypothyroidism, unspecified: Secondary | ICD-10-CM | POA: Diagnosis not present

## 2015-12-02 DIAGNOSIS — R05 Cough: Secondary | ICD-10-CM | POA: Diagnosis not present

## 2015-12-08 DIAGNOSIS — F319 Bipolar disorder, unspecified: Secondary | ICD-10-CM | POA: Diagnosis not present

## 2015-12-08 DIAGNOSIS — R05 Cough: Secondary | ICD-10-CM | POA: Diagnosis not present

## 2015-12-08 DIAGNOSIS — I7300 Raynaud's syndrome without gangrene: Secondary | ICD-10-CM | POA: Diagnosis not present

## 2015-12-08 DIAGNOSIS — M3500 Sicca syndrome, unspecified: Secondary | ICD-10-CM | POA: Diagnosis not present

## 2015-12-08 DIAGNOSIS — G629 Polyneuropathy, unspecified: Secondary | ICD-10-CM | POA: Diagnosis not present

## 2015-12-15 ENCOUNTER — Encounter: Payer: Self-pay | Admitting: Internal Medicine

## 2015-12-15 ENCOUNTER — Ambulatory Visit (INDEPENDENT_AMBULATORY_CARE_PROVIDER_SITE_OTHER): Payer: Medicare Other | Admitting: Internal Medicine

## 2015-12-15 VITALS — BP 124/80 | HR 89 | Ht 60.0 in | Wt 213.0 lb

## 2015-12-15 DIAGNOSIS — R058 Other specified cough: Secondary | ICD-10-CM

## 2015-12-15 DIAGNOSIS — R05 Cough: Secondary | ICD-10-CM

## 2015-12-15 LAB — NITRIC OXIDE: Nitric Oxide: 12

## 2015-12-15 MED ORDER — TRAMADOL HCL 50 MG PO TABS: ORAL_TABLET | ORAL | Status: DC

## 2015-12-15 NOTE — Patient Instructions (Addendum)
For drainage / throat tickle try take CHLORPHENIRAMINE  4 mg - take one every 4 hours as needed - available over the counter- may cause drowsiness so start with just a bedtime dose or two and see how you tolerate it before trying in daytime.  Take delsym two tsp every 12 hours and supplement if needed with  tramadol 50 mg up to 2 every 4 hours to suppress the urge to cough. Swallowing water or using ice chips/non mint and menthol containing candies (such as lifesavers or sugarless jolly ranchers) are also effective.  You should rest your voice and avoid activities that you know make you cough.  Once you have eliminated the cough for 3 straight days try reducing the tramadol first,  then the delsym as tolerated.    GERD (REFLUX)  is an extremely common cause of respiratory symptoms just like yours , many times with no obvious heartburn at all.    It can be treated with medication, but also with lifestyle changes including elevation of the head of your bed (ideally with 6 inch  bed blocks),  Smoking cessation, avoidance of late meals, excessive alcohol, and avoid fatty foods, chocolate, peppermint, colas, red wine, and acidic juices such as orange juice.  NO MINT OR MENTHOL PRODUCTS SO NO COUGH DROPS  USE SUGARLESS CANDY INSTEAD (Jolley ranchers or Stover's or Life Savers) or even ice chips will also do - the key is to swallow to prevent all throat clearing. NO OIL BASED VITAMINS - use powdered substitutes.    Please see patient coordinator before you leave today  to schedule evaluation by Dr [facility]

## 2015-12-15 NOTE — Progress Notes (Signed)
Subjective:    Patient ID: Teresa Tran, female    DOB: October 22, 1981   MRN: [id number]  HPI  35 yowf never smoker aware of sinus problems starting around age 35 seen by  Teresa Tran better by late teens/ never allergy tested and fine until  onset of incessant cough since Sept 2016 failed respond to treatment for reflux allergy/ asthma so referred to [facility] 12/15/2015 by Teresa Tran    12/15/2015 1st Teresa Tran office visit/ Teresa Tran  maint on prilosec 40 mg before onset of cough  Chief Complaint  Patient presents with  . Tran Consult    Referred by Teresa Tran. Pt c/o cough x 4 months. Cough is non prod and esp worse when exposed to smoke.   cough is more noticeable day than night more dry than wet/ neg resp to pred/ tessilon Using voice constantly as program co-ordinator    Kouffman Reflux v Neurogenic Cough Differentiator Reflux Comments  Do you awaken from a sound sleep coughing violently?                            With trouble breathing? no   Do you have choking episodes when you cannot  Get enough air, gasping for air ?              Yes   Do you usually cough when you lie down into  The bed, or when you just lie down to rest ?                          Yes   Do you usually cough after meals or eating?         No    Do you cough when (or after) you bend over?    nes   GERD SCORE     Kouffman Reflux v Neurogenic Cough Differentiator Neurogenic   Do you more-or-less cough all day long? yes   Does change of temperature make you cough? no   Does laughing or chuckling cause you to cough? yes   Do fumes (perfume, automobile fumes, burned  Toast, etc.,) cause you to cough ?      Yes def    Does speaking, singing, or talking on the phone cause you to cough   ?               Yes    Neurogenic/Airway score           Review of Systems  Constitutional: Negative for fever, chills and unexpected weight change.  HENT: Positive for congestion, sore throat and voice change.  Negative for dental problem, ear pain, nosebleeds, postnasal drip, rhinorrhea, sinus pressure, sneezing and trouble swallowing.   Eyes: Negative for visual disturbance.  Respiratory: Positive for cough. Negative for choking and shortness of breath.   Cardiovascular: Negative for chest pain and leg swelling.  Gastrointestinal: Negative for vomiting, abdominal pain and diarrhea.  Genitourinary: Negative for difficulty urinating.  Musculoskeletal: Negative for arthralgias.  Skin: Negative for rash.  Neurological: Positive for headaches. Negative for tremors and syncope.  Hematological: Does not bruise/bleed easily.       Objective:   Physical Exam  Obese wf flat affect   Wt Readings from Last 3 Encounters:  12/15/15 213 lb (96.616 kg)  07/07/15 204 lb (92.534 kg)  06/19/15 202 lb (91.627 kg)    Vital signs reviewed  HEENT: nl dentition, turbinates, and oropharynx. Nl external ear canals without cough reflex   NECK :  without JVD/Nodes/TM/ nl carotid upstrokes bilaterally   LUNGS: no acc muscle use,  Nl contour chest which is clear to A and P bilaterally without cough on insp or exp maneuvers   CV:  RRR  no s3 or murmur or increase in P2, no edema   ABD:  soft and nontender with nl inspiratory excursion in the supine position. No bruits or organomegaly, bowel sounds nl  MS:  Nl gait/ ext warm without deformities, calf tenderness, cyanosis or clubbing No obvious joint restrictions   SKIN: warm and dry without lesions    NEURO:  alert, approp, nl sensorium with  no motor deficits      I personally reviewed images and agree with radiology impression as follows:  CXR:  11/28/15 Normal esophagram without evidence of perforation or wall irregularity      Assessment & Plan:

## 2015-12-16 ENCOUNTER — Encounter: Payer: Self-pay | Admitting: Internal Medicine

## 2015-12-16 NOTE — Assessment & Plan Note (Signed)
Esophagram 05/09/15 Normal esophagram without evidence of perforation or wall Irregularity -  NO 12/15/2015    =  12   The most common causes of chronic cough in immunocompetent adults include the following: upper airway cough syndrome (UACS), previously referred to as postnasal drip syndrome (PNDS), which is caused by variety of rhinosinus conditions; (2) asthma; (3) GERD; (4) chronic bronchitis from cigarette smoking or other inhaled environmental irritants; (5) nonasthmatic eosinophilic bronchitis; and (6) bronchiectasis.   These conditions, singly or in combination, have accounted for up to 94% of the causes of chronic cough in prospective studies.   Other conditions have constituted no >6% of the causes in prospective studies These have included bronchogenic carcinoma, chronic interstitial pneumonia, sarcoidosis, left ventricular failure, ACEI-induced cough, and aspiration from a condition associated with pharyngeal dysfunction.    Chronic cough is often simultaneously caused by more than one condition. A single cause has been found from 38 to 82% of the time, multiple causes from 18 to 62%. Multiply caused cough has been the result of three diseases up to 42% of the time.       Based on hx(including failure to resp to prednisone and narcotic cough meds)  and exam, this is most likely:  Classic Upper airway cough syndrome, so named because it's frequently impossible to sort out how much is  CR/sinusitis with freq throat clearing (which can be related to primary GERD)   vs  causing  secondary (" extra esophageal")  GERD from wide swings in gastric pressure that occur with throat clearing, often  promoting self use of mint and menthol lozenges that reduce the lower esophageal sphincter tone and exacerbate the problem further in a cyclical fashion.   These are the same pts (now being labeled as having "irritable larynx syndrome" by some cough centers) who not infrequently have a history of having  failed to tolerate ace inhibitors,  dry powder inhalers or biphosphonates or report having atypical reflux symptoms that don't respond to standard doses of PPI , and are easily confused as having aecopd or asthma flares by even experienced allergists/ pulmonologists.   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 and needs to work with the best in this field > Dr Joya Gaskins at [facility]  In meantime will try to eliminate cyclical cough with max suppression of cough and gerd though I did not get any buy in on her part for the program I offered "too busy to take a few days off to suppress the cough/ using voice constantly)  I had an extended discussion with the patient reviewing all relevant studies completed to date and  lasting 35 minutes of a 60 minute visit    Each maintenance medication was reviewed in detail including most importantly the difference between maintenance and prns and under what circumstances the prns are to be triggered using an action plan format that is not reflected in the computer generated alphabetically organized AVS.    Please see instructions for details which were reviewed in writing and the patient given a copy highlighting the part that I personally wrote and discussed at today's ov.   The first step is to maximize acid suppression and eliminate cyclical coughing then regroup if the cough persists.  I had an extended discussion with the patient reviewing all relevant studies completed to date and  lasting 35 min

## 2015-12-29 DIAGNOSIS — J387 Other diseases of larynx: Secondary | ICD-10-CM | POA: Insufficient documentation

## 2015-12-29 DIAGNOSIS — J384 Edema of larynx: Secondary | ICD-10-CM | POA: Diagnosis not present

## 2015-12-29 DIAGNOSIS — Z881 Allergy status to other antibiotic agents status: Secondary | ICD-10-CM | POA: Diagnosis not present

## 2015-12-29 DIAGNOSIS — R05 Cough: Secondary | ICD-10-CM | POA: Diagnosis not present

## 2016-04-08 DIAGNOSIS — A770 Spotted fever due to Rickettsia rickettsii: Secondary | ICD-10-CM

## 2016-04-08 HISTORY — DX: Spotted fever due to Rickettsia rickettsii: A77.0

## 2016-04-09 DIAGNOSIS — R531 Weakness: Secondary | ICD-10-CM | POA: Diagnosis not present

## 2016-04-09 DIAGNOSIS — M542 Cervicalgia: Secondary | ICD-10-CM | POA: Diagnosis not present

## 2016-04-09 DIAGNOSIS — R51 Headache: Secondary | ICD-10-CM | POA: Diagnosis not present

## 2016-04-09 DIAGNOSIS — M25512 Pain in left shoulder: Secondary | ICD-10-CM | POA: Diagnosis not present

## 2016-04-09 DIAGNOSIS — T148 Other injury of unspecified body region: Secondary | ICD-10-CM | POA: Diagnosis not present

## 2016-04-09 DIAGNOSIS — M25552 Pain in left hip: Secondary | ICD-10-CM | POA: Diagnosis not present

## 2016-04-24 DIAGNOSIS — R5381 Other malaise: Secondary | ICD-10-CM | POA: Diagnosis not present

## 2016-04-24 DIAGNOSIS — Z20818 Contact with and (suspected) exposure to other bacterial communicable diseases: Secondary | ICD-10-CM | POA: Diagnosis not present

## 2016-04-24 DIAGNOSIS — R0602 Shortness of breath: Secondary | ICD-10-CM | POA: Diagnosis not present

## 2016-04-24 DIAGNOSIS — M436 Torticollis: Secondary | ICD-10-CM | POA: Diagnosis not present

## 2016-04-24 DIAGNOSIS — A770 Spotted fever due to Rickettsia rickettsii: Secondary | ICD-10-CM | POA: Diagnosis not present

## 2016-04-24 DIAGNOSIS — R51 Headache: Secondary | ICD-10-CM | POA: Diagnosis not present

## 2016-04-24 DIAGNOSIS — M542 Cervicalgia: Secondary | ICD-10-CM | POA: Diagnosis not present

## 2016-04-24 DIAGNOSIS — R079 Chest pain, unspecified: Secondary | ICD-10-CM | POA: Diagnosis not present

## 2016-04-24 DIAGNOSIS — F319 Bipolar disorder, unspecified: Secondary | ICD-10-CM | POA: Diagnosis not present

## 2016-04-30 ENCOUNTER — Other Ambulatory Visit: Payer: Medicare Other | Admitting: Adult Health

## 2016-04-30 ENCOUNTER — Encounter: Payer: Self-pay | Admitting: *Deleted

## 2016-05-10 ENCOUNTER — Ambulatory Visit (INDEPENDENT_AMBULATORY_CARE_PROVIDER_SITE_OTHER): Payer: Medicare Other | Admitting: Otolaryngology

## 2016-05-10 DIAGNOSIS — J310 Chronic rhinitis: Secondary | ICD-10-CM | POA: Diagnosis not present

## 2016-05-10 DIAGNOSIS — J320 Chronic maxillary sinusitis: Secondary | ICD-10-CM | POA: Diagnosis not present

## 2016-05-10 DIAGNOSIS — J343 Hypertrophy of nasal turbinates: Secondary | ICD-10-CM

## 2016-05-10 DIAGNOSIS — K219 Gastro-esophageal reflux disease without esophagitis: Secondary | ICD-10-CM

## 2016-05-12 ENCOUNTER — Other Ambulatory Visit (INDEPENDENT_AMBULATORY_CARE_PROVIDER_SITE_OTHER): Payer: Self-pay | Admitting: Otolaryngology

## 2016-05-12 DIAGNOSIS — J0101 Acute recurrent maxillary sinusitis: Secondary | ICD-10-CM

## 2016-05-20 ENCOUNTER — Ambulatory Visit (HOSPITAL_COMMUNITY)
Admission: RE | Admit: 2016-05-20 | Discharge: 2016-05-20 | Disposition: A | Discharge status: Home / Self Care | Payer: Medicare Other | Source: Ambulatory Visit | Attending: Otolaryngology | Admitting: Otolaryngology

## 2016-05-20 ENCOUNTER — Ambulatory Visit (HOSPITAL_COMMUNITY): Payer: Medicare Other

## 2016-05-20 DIAGNOSIS — R51 Headache: Secondary | ICD-10-CM | POA: Diagnosis not present

## 2016-05-20 DIAGNOSIS — J0101 Acute recurrent maxillary sinusitis: Secondary | ICD-10-CM | POA: Diagnosis not present

## 2016-06-02 DIAGNOSIS — R51 Headache: Secondary | ICD-10-CM | POA: Diagnosis not present

## 2016-06-02 DIAGNOSIS — N3001 Acute cystitis with hematuria: Secondary | ICD-10-CM | POA: Diagnosis not present

## 2016-06-02 DIAGNOSIS — Z79899 Other long term (current) drug therapy: Secondary | ICD-10-CM | POA: Diagnosis not present

## 2016-06-02 DIAGNOSIS — R42 Dizziness and giddiness: Secondary | ICD-10-CM | POA: Diagnosis not present

## 2016-06-14 ENCOUNTER — Telehealth: Payer: Self-pay | Admitting: Adult Health

## 2016-06-14 ENCOUNTER — Ambulatory Visit: Payer: Medicare Other | Admitting: Adult Health

## 2016-06-14 MED ORDER — FLUCONAZOLE 150 MG PO TABS: 150.0000 mg | ORAL_TABLET | Freq: Once | ORAL | 1 refills | Status: AC

## 2016-06-14 NOTE — Telephone Encounter (Signed)
Pt states just finished abx for [place] MTN fever by ER MD. Pt states she now has a yeast infection, requesting RX for Diflucan. Pt states has an appt with Derrek Monaco, NP 06/25/2016.

## 2016-06-14 NOTE — Telephone Encounter (Signed)
rx sent for diflucan

## 2016-06-17 ENCOUNTER — Ambulatory Visit (INDEPENDENT_AMBULATORY_CARE_PROVIDER_SITE_OTHER): Payer: Medicare Other | Admitting: Otolaryngology

## 2016-06-17 DIAGNOSIS — J310 Chronic rhinitis: Secondary | ICD-10-CM | POA: Diagnosis not present

## 2016-06-17 DIAGNOSIS — J343 Hypertrophy of nasal turbinates: Secondary | ICD-10-CM | POA: Diagnosis not present

## 2016-06-17 DIAGNOSIS — J342 Deviated nasal septum: Secondary | ICD-10-CM

## 2016-06-18 ENCOUNTER — Other Ambulatory Visit: Payer: Self-pay | Admitting: Otolaryngology

## 2016-06-21 ENCOUNTER — Telehealth: Payer: Self-pay | Admitting: *Deleted

## 2016-06-21 ENCOUNTER — Ambulatory Visit: Payer: Medicare Other | Admitting: Neurology

## 2016-06-21 NOTE — Telephone Encounter (Signed)
no showed new patient appt 

## 2016-06-23 DIAGNOSIS — G629 Polyneuropathy, unspecified: Secondary | ICD-10-CM | POA: Diagnosis not present

## 2016-06-23 DIAGNOSIS — M791 Myalgia: Secondary | ICD-10-CM | POA: Diagnosis not present

## 2016-06-25 ENCOUNTER — Other Ambulatory Visit: Payer: Medicare Other | Admitting: Adult Health

## 2016-06-28 ENCOUNTER — Other Ambulatory Visit: Payer: Medicare Other | Admitting: Adult Health

## 2016-06-28 ENCOUNTER — Encounter: Payer: Self-pay | Admitting: Neurology

## 2016-06-28 DIAGNOSIS — S30860A Insect bite (nonvenomous) of lower back and pelvis, initial encounter: Secondary | ICD-10-CM | POA: Diagnosis not present

## 2016-07-13 DIAGNOSIS — F17200 Nicotine dependence, unspecified, uncomplicated: Secondary | ICD-10-CM | POA: Diagnosis not present

## 2016-07-13 DIAGNOSIS — R790 Abnormal level of blood mineral: Secondary | ICD-10-CM | POA: Diagnosis not present

## 2016-07-13 DIAGNOSIS — R7989 Other specified abnormal findings of blood chemistry: Secondary | ICD-10-CM | POA: Diagnosis not present

## 2016-07-13 DIAGNOSIS — R531 Weakness: Secondary | ICD-10-CM | POA: Diagnosis not present

## 2016-07-14 ENCOUNTER — Encounter (HOSPITAL_BASED_OUTPATIENT_CLINIC_OR_DEPARTMENT_OTHER): Payer: Self-pay | Admitting: *Deleted

## 2016-07-14 ENCOUNTER — Encounter: Payer: Self-pay | Admitting: Adult Health

## 2016-07-14 ENCOUNTER — Ambulatory Visit (INDEPENDENT_AMBULATORY_CARE_PROVIDER_SITE_OTHER): Payer: Medicare Other | Admitting: Adult Health

## 2016-07-14 VITALS — BP 110/80 | HR 76 | Ht 60.0 in | Wt 222.5 lb

## 2016-07-14 DIAGNOSIS — N63 Unspecified lump in breast: Secondary | ICD-10-CM | POA: Diagnosis not present

## 2016-07-14 DIAGNOSIS — N644 Mastodynia: Secondary | ICD-10-CM

## 2016-07-14 DIAGNOSIS — Z01419 Encounter for gynecological examination (general) (routine) without abnormal findings: Secondary | ICD-10-CM

## 2016-07-14 DIAGNOSIS — N6312 Unspecified lump in the right breast, upper inner quadrant: Secondary | ICD-10-CM

## 2016-07-14 NOTE — Progress Notes (Signed)
Patient ID: Teresa Tran, female   DOB: 1981-08-21, 35 y.o.   MRN: [id number] History of Present Illness: Teresa Tran is a 35 year old white female, married, in for a well woman gyn exam, she had a normal pap with negative HPV 02/19/15.She says she has breast tenderness and has lump in right breast for about 2 months.She was seen in ER at [facility] yesterday for feeling weak and TSH was 4.80 and Iron was 42, she has appt with Dr [name] 9/9.She has random chest pain and Shortness of breath at times too.She says she has been treated for [facility] fever this year (4x) and has headaches at times still, and pain in hips(was referred to orthopedic).Has sinus surgery 9/12 in [place] with Dr Benjamine Mola. PCP is Z. Hall.   Current Medications, Allergies, Past Medical History, Past Surgical History, Family History and Social History were reviewed in [employer] record.     Review of Systems:  Patient denies any, hearing loss,  blurred vision, abdominal pain, problems with bowel movements, urination, or intercourse. No mood swings. See HPI for positives.  Physical Exam:BP 110/80 (BP Location: Left Arm, Patient Position: Sitting, Cuff Size: Large)   Pulse 76   Ht 5' (1.524 m)   Wt 222 lb 8 oz (100.9 kg)   LMP 06/28/2016 (Approximate)   BMI 43.45 kg/m  General:  Well developed, well nourished, no acute distress Skin:  Warm and dry Neck:  Midline trachea, normal thyroid, good ROM, no lymphadenopathy Lungs; Clear to auscultation bilaterally Breast:  No dominant palpable mass, retraction, or nipple discharge on left, on right no retraction or nipple discharge, but has oval nodule at 1 0'clock about 4 FB from nipple,  Cardiovascular: Regular rate and rhythm Abdomen:  Soft, non tender, no hepatosplenomegaly Pelvic:  External genitalia is normal in appearance, no lesions.  The vagina is normal in appearance. Urethra has no lesions or masses. The cervix is bulbous.  Uterus is felt to be  normal size, shape, and contour.  No adnexal masses or tenderness noted.Bladder is non tender, no masses felt. Extremities/musculoskeletal:  No swelling or varicosities noted, no clubbing or cyanosis Psych:  No mood changes, alert and cooperative,seems happy   Impression: Well woman exam with routine gynecological exam  Breast tenderness in female - Plan: US BREAST LTD UNI LEFT INC AXILLA, US BREAST LTD UNI RIGHT INC AXILLA, MM DIAG BREAST TOMO BILATERAL  Breast lump on right side at 1 o'clock position - Plan: US BREAST LTD UNI LEFT INC AXILLA, US BREAST LTD UNI RIGHT INC AXILLA, [place]: Diagnostic mammogram and [name] at [facility] 9/19 at 9 am Physical in 1 year, pap in 2019 Labs with PCP See Dr [name] 9/9

## 2016-07-14 NOTE — Patient Instructions (Addendum)
Get mammogram at [facility] 9/19/ at 9 am  Physical in 1 year, pap in 2019 See Dr [name] 9/9 as scheduled

## 2016-07-17 DIAGNOSIS — E6609 Other obesity due to excess calories: Secondary | ICD-10-CM | POA: Diagnosis not present

## 2016-07-17 DIAGNOSIS — D509 Iron deficiency anemia, unspecified: Secondary | ICD-10-CM | POA: Diagnosis not present

## 2016-07-17 DIAGNOSIS — E039 Hypothyroidism, unspecified: Secondary | ICD-10-CM | POA: Diagnosis not present

## 2016-07-20 ENCOUNTER — Ambulatory Visit (HOSPITAL_BASED_OUTPATIENT_CLINIC_OR_DEPARTMENT_OTHER): Payer: Medicare Other | Admitting: Anesthesiology

## 2016-07-20 ENCOUNTER — Encounter (HOSPITAL_BASED_OUTPATIENT_CLINIC_OR_DEPARTMENT_OTHER): Payer: Self-pay | Admitting: Anesthesiology

## 2016-07-20 ENCOUNTER — Encounter (HOSPITAL_BASED_OUTPATIENT_CLINIC_OR_DEPARTMENT_OTHER)
Admission: RE | Disposition: A | Discharge status: Home / Self Care | Payer: Self-pay | Source: Ambulatory Visit | Attending: Otolaryngology

## 2016-07-20 ENCOUNTER — Ambulatory Visit (HOSPITAL_BASED_OUTPATIENT_CLINIC_OR_DEPARTMENT_OTHER)
Admission: RE | Admit: 2016-07-20 | Discharge: 2016-07-20 | Disposition: A | Discharge status: Home / Self Care | Payer: Medicare Other | Source: Ambulatory Visit | Attending: Otolaryngology | Admitting: Otolaryngology

## 2016-07-20 DIAGNOSIS — J342 Deviated nasal septum: Secondary | ICD-10-CM | POA: Diagnosis not present

## 2016-07-20 DIAGNOSIS — G588 Other specified mononeuropathies: Secondary | ICD-10-CM | POA: Diagnosis not present

## 2016-07-20 DIAGNOSIS — R51 Headache: Secondary | ICD-10-CM | POA: Diagnosis not present

## 2016-07-20 DIAGNOSIS — J3489 Other specified disorders of nose and nasal sinuses: Secondary | ICD-10-CM | POA: Insufficient documentation

## 2016-07-20 DIAGNOSIS — J343 Hypertrophy of nasal turbinates: Secondary | ICD-10-CM | POA: Insufficient documentation

## 2016-07-20 HISTORY — PX: EXCISION CHONCHA BULLOSA: SHX6435

## 2016-07-20 HISTORY — PX: ENDOSCOPIC CONCHA BULLOSA RESECTION: SHX6395

## 2016-07-20 SURGERY — EXCISION, CONCHA BULLOSA
Anesthesia: General | Site: Nose

## 2016-07-20 MED ORDER — OXYCODONE HCL 5 MG PO TABS
ORAL_TABLET | ORAL | Status: AC
  Filled 2016-07-20: qty 1

## 2016-07-20 MED ORDER — LACTATED RINGERS IV SOLN
INTRAVENOUS | Status: DC
  Administered 2016-07-20 (×2): via INTRAVENOUS

## 2016-07-20 MED ORDER — OXYCODONE-ACETAMINOPHEN 5-325 MG PO TABS: 1.0000 | ORAL_TABLET | Freq: Four times a day (QID) | ORAL | 0 refills | Status: DC | PRN

## 2016-07-20 MED ORDER — PROPOFOL 500 MG/50ML IV EMUL
INTRAVENOUS | Status: AC
  Filled 2016-07-20: qty 50

## 2016-07-20 MED ORDER — SCOPOLAMINE 1 MG/3DAYS TD PT72
1.0000 | MEDICATED_PATCH | Freq: Once | TRANSDERMAL | Status: DC | PRN
Start: 2016-07-20 — End: 2016-07-20
  Administered 2016-07-20: 1.5 mg via TRANSDERMAL

## 2016-07-20 MED ORDER — ROCURONIUM BROMIDE 100 MG/10ML IV SOLN
INTRAVENOUS | Status: DC | PRN
  Administered 2016-07-20: 30 mg via INTRAVENOUS

## 2016-07-20 MED ORDER — DEXAMETHASONE SODIUM PHOSPHATE 4 MG/ML IJ SOLN
INTRAMUSCULAR | Status: DC | PRN
  Administered 2016-07-20: 10 mg via INTRAVENOUS

## 2016-07-20 MED ORDER — FENTANYL CITRATE (PF) 100 MCG/2ML IJ SOLN
INTRAMUSCULAR | Status: AC
  Filled 2016-07-20: qty 2

## 2016-07-20 MED ORDER — SUCCINYLCHOLINE CHLORIDE 200 MG/10ML IV SOSY
PREFILLED_SYRINGE | INTRAVENOUS | Status: AC
  Filled 2016-07-20: qty 10

## 2016-07-20 MED ORDER — LIDOCAINE 2% (20 MG/ML) 5 ML SYRINGE
INTRAMUSCULAR | Status: AC
  Filled 2016-07-20: qty 5

## 2016-07-20 MED ORDER — HYDROMORPHONE HCL 1 MG/ML IJ SOLN
INTRAMUSCULAR | Status: AC
  Filled 2016-07-20: qty 1

## 2016-07-20 MED ORDER — MEPERIDINE HCL 25 MG/ML IJ SOLN: 6.2500 mg | INTRAMUSCULAR | Status: DC | PRN

## 2016-07-20 MED ORDER — DEXAMETHASONE SODIUM PHOSPHATE 10 MG/ML IJ SOLN
INTRAMUSCULAR | Status: AC
  Filled 2016-07-20: qty 1

## 2016-07-20 MED ORDER — ATROPINE SULFATE 0.4 MG/ML IJ SOLN
INTRAMUSCULAR | Status: AC
  Filled 2016-07-20: qty 1

## 2016-07-20 MED ORDER — SUGAMMADEX SODIUM 200 MG/2ML IV SOLN
INTRAVENOUS | Status: DC | PRN
  Administered 2016-07-20: 200 mg via INTRAVENOUS

## 2016-07-20 MED ORDER — SUCCINYLCHOLINE CHLORIDE 20 MG/ML IJ SOLN
INTRAMUSCULAR | Status: DC | PRN
  Administered 2016-07-20: 120 mg via INTRAVENOUS

## 2016-07-20 MED ORDER — PROPOFOL 10 MG/ML IV BOLUS
INTRAVENOUS | Status: DC | PRN
  Administered 2016-07-20 (×3): 30 mg via INTRAVENOUS
  Administered 2016-07-20: 200 mg via INTRAVENOUS

## 2016-07-20 MED ORDER — HYDROMORPHONE HCL 1 MG/ML IJ SOLN
0.2500 mg | INTRAMUSCULAR | Status: DC | PRN
  Administered 2016-07-20 (×2): 0.5 mg via INTRAVENOUS

## 2016-07-20 MED ORDER — PROMETHAZINE HCL 25 MG/ML IJ SOLN: 6.2500 mg | INTRAMUSCULAR | Status: DC | PRN

## 2016-07-20 MED ORDER — FENTANYL CITRATE (PF) 100 MCG/2ML IJ SOLN
50.0000 ug | INTRAMUSCULAR | Status: AC | PRN
  Administered 2016-07-20: 50 ug via INTRAVENOUS
  Administered 2016-07-20: 100 ug via INTRAVENOUS
  Administered 2016-07-20: 50 ug via INTRAVENOUS

## 2016-07-20 MED ORDER — LIDOCAINE-EPINEPHRINE 1 %-1:100000 IJ SOLN
INTRAMUSCULAR | Status: DC | PRN
  Administered 2016-07-20: 2 mL

## 2016-07-20 MED ORDER — LACTATED RINGERS IV SOLN: INTRAVENOUS | Status: DC

## 2016-07-20 MED ORDER — MIDAZOLAM HCL 2 MG/2ML IJ SOLN
1.0000 mg | INTRAMUSCULAR | Status: DC | PRN
  Administered 2016-07-20: 2 mg via INTRAVENOUS

## 2016-07-20 MED ORDER — GLYCOPYRROLATE 0.2 MG/ML IJ SOLN: 0.2000 mg | Freq: Once | INTRAMUSCULAR | Status: DC | PRN

## 2016-07-20 MED ORDER — ONDANSETRON HCL 4 MG/2ML IJ SOLN
INTRAMUSCULAR | Status: AC
  Filled 2016-07-20: qty 2

## 2016-07-20 MED ORDER — MUPIROCIN 2 % EX OINT
TOPICAL_OINTMENT | CUTANEOUS | Status: DC | PRN
  Administered 2016-07-20: 1 via TOPICAL

## 2016-07-20 MED ORDER — OXYCODONE HCL 5 MG/5ML PO SOLN: 5.0000 mg | Freq: Once | ORAL | Status: AC | PRN

## 2016-07-20 MED ORDER — SCOPOLAMINE 1 MG/3DAYS TD PT72
MEDICATED_PATCH | TRANSDERMAL | Status: AC
  Filled 2016-07-20: qty 1

## 2016-07-20 MED ORDER — OXYMETAZOLINE HCL 0.05 % NA SOLN
NASAL | Status: DC | PRN
  Administered 2016-07-20: 1 via TOPICAL

## 2016-07-20 MED ORDER — LIDOCAINE HCL (CARDIAC) 20 MG/ML IV SOLN
INTRAVENOUS | Status: DC | PRN
Start: 2016-07-20 — End: 2016-07-20
  Administered 2016-07-20: 100 mg via INTRAVENOUS

## 2016-07-20 MED ORDER — MIDAZOLAM HCL 2 MG/2ML IJ SOLN
INTRAMUSCULAR | Status: AC
  Filled 2016-07-20: qty 2

## 2016-07-20 MED ORDER — OXYCODONE HCL 5 MG PO TABS
5.0000 mg | ORAL_TABLET | Freq: Once | ORAL | Status: AC | PRN
  Administered 2016-07-20: 5 mg via ORAL

## 2016-07-20 MED ORDER — AMOXICILLIN 875 MG PO TABS: 875.0000 mg | ORAL_TABLET | Freq: Two times a day (BID) | ORAL | 0 refills | Status: AC

## 2016-07-20 SURGICAL SUPPLY — 50 items
ATTRACTOMAT 16X20 MAGNETIC DRP (DRAPES) IMPLANT
BLADE RAD40 ROTATE 4M 4 5PK (BLADE) IMPLANT
BLADE RAD40 ROTATE 4M 4MM 5PK (BLADE)
BLADE RAD60 ROTATE M4 4 5PK (BLADE) IMPLANT
BLADE RAD60 ROTATE M4 4MM 5PK (BLADE)
BLADE TRICUT ROTATE M4 4 5PK (BLADE) IMPLANT
BLADE TRICUT ROTATE M4 4MM 5PK (BLADE)
BUR HS RAD FRONTAL 3 (BURR) IMPLANT
BUR HS RAD FRONTAL 3MM (BURR)
CANISTER SUC SOCK COL 7IN (MISCELLANEOUS) IMPLANT
CANISTER SUCT 1200ML W/VALVE (MISCELLANEOUS) ×5 IMPLANT
COAGULATOR SUCT 6 FR SWTCH (ELECTROSURGICAL)
COAGULATOR SUCT 8FR VV (MISCELLANEOUS) ×5 IMPLANT
COAGULATOR SUCT SWTCH 10FR 6 (ELECTROSURGICAL) IMPLANT
DECANTER SPIKE VIAL GLASS SM (MISCELLANEOUS) IMPLANT
DRSG NASAL KENNEDY LMNT 8CM (GAUZE/BANDAGES/DRESSINGS) IMPLANT
DRSG NASOPORE 8CM (GAUZE/BANDAGES/DRESSINGS) IMPLANT
DRSG TELFA 3X8 NADH (GAUZE/BANDAGES/DRESSINGS) IMPLANT
ELECT REM PT RETURN 9FT ADLT (ELECTROSURGICAL) ×5
ELECTRODE REM PT RTRN 9FT ADLT (ELECTROSURGICAL) ×3 IMPLANT
GLOVE BIO SURGEON STRL SZ 6.5 (GLOVE) ×4 IMPLANT
GLOVE BIO SURGEON STRL SZ7.5 (GLOVE) ×5 IMPLANT
GLOVE BIO SURGEONS STRL SZ 6.5 (GLOVE) ×1
GOWN STRL REUS W/ TWL LRG LVL3 (GOWN DISPOSABLE) ×6 IMPLANT
GOWN STRL REUS W/TWL LRG LVL3 (GOWN DISPOSABLE) ×4
HEMOSTAT SURGICEL 2X14 (HEMOSTASIS) IMPLANT
IV NS 500ML (IV SOLUTION)
IV NS 500ML BAXH (IV SOLUTION) IMPLANT
IV SET EXT 30 76VOL 4 MALE LL (IV SETS) IMPLANT
NEEDLE HYPO 25X1 1.5 SAFETY (NEEDLE) ×5 IMPLANT
NEEDLE SPNL 25GX3.5 QUINCKE BL (NEEDLE) IMPLANT
NS IRRIG 1000ML POUR BTL (IV SOLUTION) ×5 IMPLANT
PACK BASIN DAY SURGERY FS (CUSTOM PROCEDURE TRAY) ×5 IMPLANT
PACK ENT DAY SURGERY (CUSTOM PROCEDURE TRAY) ×5 IMPLANT
PACKING NASAL EPIS 4X2.4 XEROG (MISCELLANEOUS) IMPLANT
SLEEVE SCD COMPRESS KNEE MED (MISCELLANEOUS) IMPLANT
SOLUTION BUTLER CLEAR DIP (MISCELLANEOUS) ×10 IMPLANT
SPLINT NASAL AIRWAY SILICONE (MISCELLANEOUS) IMPLANT
SPONGE GAUZE 2X2 8PLY STER LF (GAUZE/BANDAGES/DRESSINGS) ×1
SPONGE GAUZE 2X2 8PLY STRL LF (GAUZE/BANDAGES/DRESSINGS) ×4 IMPLANT
SPONGE NEURO XRAY DETECT 1X3 (DISPOSABLE) ×5 IMPLANT
SUT CHROMIC 4 0 P 3 18 (SUTURE) ×5 IMPLANT
SUT ETHILON 3 0 PS 1 (SUTURE) IMPLANT
SUT PLAIN 4 0 ~~LOC~~ 1 (SUTURE) ×5 IMPLANT
SUT PROLENE 3 0 PS 2 (SUTURE) ×5 IMPLANT
TOWEL OR 17X24 6PK STRL BLUE (TOWEL DISPOSABLE) ×5 IMPLANT
TUBE CONNECTING 20'X1/4 (TUBING) ×1
TUBE CONNECTING 20X1/4 (TUBING) ×4 IMPLANT
TUBE SALEM SUMP 16 FR W/ARV (TUBING) IMPLANT
YANKAUER SUCT BULB TIP NO VENT (SUCTIONS) IMPLANT

## 2016-07-20 NOTE — Anesthesia Procedure Notes (Signed)
Procedure Name: Intubation Date/Time: 07/20/2016 8:30 AM Performed by: Lieutenant Diego Pre-anesthesia Checklist: Patient identified, Emergency Drugs available, Suction available and Patient being monitored Patient Re-evaluated:Patient Re-evaluated prior to inductionOxygen Delivery Method: Circle system utilized Preoxygenation: Pre-oxygenation with 100% oxygen Intubation Type: IV induction Ventilation: Mask ventilation without difficulty Laryngoscope Size: Miller and 2 Grade View: Grade I Tube type: Oral Tube size: 7.0 mm Number of attempts: 1 Airway Equipment and Method: Stylet and Oral airway Placement Confirmation: ETT inserted through vocal cords under direct vision,  positive ETCO2 and breath sounds checked- equal and bilateral Secured at: 22 cm Tube secured with: Tape Dental Injury: Teeth and Oropharynx as per pre-operative assessment

## 2016-07-20 NOTE — Anesthesia Preprocedure Evaluation (Addendum)
Anesthesia Evaluation  Patient identified by MRN, date of birth, ID band Patient awake    Reviewed: Allergy & Precautions, NPO status , Patient's Chart, lab work & pertinent test results  Airway Mallampati: III     Mouth opening: Limited Mouth Opening  Dental  (+) Teeth Intact, Dental Advisory Given   Pulmonary    breath sounds clear to auscultation       Cardiovascular negative cardio ROS   Rhythm:Regular Rate:Normal     Neuro/Psych  Headaches, PSYCHIATRIC DISORDERS Depression Bipolar Disorder    GI/Hepatic Neg liver ROS, PUD,   Endo/Other  negative endocrine ROS  Renal/GU negative Renal ROS  negative genitourinary   Musculoskeletal negative musculoskeletal ROS (+)   Abdominal (+) + obese,  Abdomen: soft.    Peds negative pediatric ROS (+)  Hematology negative hematology ROS (+)   Anesthesia Other Findings   Reproductive/Obstetrics negative OB ROS                            Lab Results  Component Value Date   WBC 5.9 11/21/2014   HGB 12.6 11/21/2014   HCT 37.9 11/21/2014   MCV 84.2 11/21/2014   PLT 231 11/21/2014     Anesthesia Physical Anesthesia Plan  ASA: III  Anesthesia Plan: General   Post-op Pain Management:    Induction: Intravenous  Airway Management Planned: Oral ETT  Additional Equipment:   Intra-op Plan:   Post-operative Plan: Extubation in OR  Informed Consent: I have reviewed the patients History and Physical, chart, labs and discussed the procedure including the risks, benefits and alternatives for the proposed anesthesia with the patient or authorized representative who has indicated his/her understanding and acceptance.   Dental advisory given  Plan Discussed with: CRNA  Anesthesia Plan Comments:         Anesthesia Quick Evaluation

## 2016-07-20 NOTE — Discharge Instructions (Addendum)

## 2016-07-20 NOTE — Anesthesia Postprocedure Evaluation (Signed)
Anesthesia Post Note  Patient: Teresa Tran  Procedure(s) Performed: Procedure(s) (LRB): NASAL SEPTOPLASTY WITH BILATERAL TURBINATE REDUCTION (N/A) ENDOSCOPIC LEFT  CONCHA BULLOSA RESECTION (Left)  Patient location during evaluation: PACU Anesthesia Type: General Level of consciousness: awake and alert Pain management: pain level controlled Vital Signs Assessment: post-procedure vital signs reviewed and stable Respiratory status: spontaneous breathing, nonlabored ventilation, respiratory function stable and patient connected to nasal cannula oxygen Cardiovascular status: blood pressure returned to baseline and stable Postop Assessment: no signs of nausea or vomiting Anesthetic complications: no    Last Vitals:  Vitals:   07/20/16 1043 07/20/16 1100  BP: 132/83 135/77  Pulse: 79 82  Resp: 14 16  Temp:  36.5 C    Last Pain:  Vitals:   07/20/16 1100  TempSrc:   PainSc: [place] Nela Bascom

## 2016-07-20 NOTE — Op Note (Signed)
DATE OF PROCEDURE: 07/20/2016  OPERATIVE REPORT   SURGEON: Leta Baptist, MD   PREOPERATIVE DIAGNOSES:  1. Severe nasal septal deviation.  2. Bilateral inferior turbinate hypertrophy.  3. Left concha bullosa. 4. Chronic nasal obstruction.  POSTOPERATIVE DIAGNOSES:  1. Severe nasal septal deviation.  2. Bilateral inferior turbinate hypertrophy.  3. Left concha bullosa. 4. Chronic nasal obstruction.  PROCEDURE PERFORMED:  1. Septoplasty.  2. Bilateral partial inferior turbinate resection.  3. Endoscopic excision of left concha bullosa.  ANESTHESIA: General endotracheal tube anesthesia.   COMPLICATIONS: None.   ESTIMATED BLOOD LOSS: Approximately 200 mL.   INDICATION FOR PROCEDURE: Teresa Tran is a 35 y.o. female with a history of chronic nasal obstruction. The patient was  treated with antihistamine, decongestant, and steroid nasal spray. However, the patient continues to be symptomatic. On examination, the patient was noted to have bilateral severe inferior turbinate hypertrophy and significant nasal septal deviation, causing significant nasal obstruction. On her CT scan, she was also noted to have a large left concha bullosa. Based on the above findings, the decision was made for the patient to undergo the above-stated procedure. The risks, benefits, alternatives, and details of the procedure were discussed with the patient. Questions were invited and answered. Informed consent was obtained.   DESCRIPTION OF PROCEDURE: The patient was taken to the operating room and placed supine on the operating table. General endotracheal tube anesthesia was administered by the anesthesiologist. The patient was positioned, and prepped and draped in the standard fashion for nasal surgery. Pledgets soaked with Afrin were placed in both nasal cavities for decongestion. The pledgets were subsequently removed. The above mentioned severe septal deviation was again noted. 1% lidocaine with 1:100,000 epinephrine  was injected onto the nasal septum bilaterally. A hemitransfixion incision was made on the left side. The mucosal flap was carefully elevated on the left side. A cartilaginous incision was made 1 cm superior to the caudal margin of the nasal septum. Mucosal flap was also elevated on the right side in the similar fashion. It should be noted that due to the severe septal deviation, the deviated portion of the cartilaginous and bony septum had to be removed in piecemeal fashion. Once the deviated portions were removed, a straight midline septum was achieved. The septum was then quilted with 4-0 plain gut sutures. The hemitransfixion incision was closed with interrupted 4-0 chromic sutures.   Attention was then focused on the left concha bullosa. Using a 0 scope, the left concha bullosa was visualized. A sickle knife was used to make a vertical incision through the midportion of the concha bullosa. The lateral portion of the concha bullosa was removed using a Tru-Cut forceps. The left middle turbinate was then lateralized.   The inferior one half of both hypertrophied inferior turbinate was crossclamped with a Kelly clamp. The inferior one half of each inferior turbinate was then resected with a pair of cross cutting scissors. Hemostasis was achieved with a suction cautery device.   The care of the patient was turned over to the anesthesiologist. The patient was awakened from anesthesia without difficulty. The patient was extubated and transferred to the recovery room in good condition.   OPERATIVE FINDINGS: Severe nasal septal deviation, left concha bullosa, and bilateral inferior turbinate hypertrophy.   SPECIMEN: None.   FOLLOWUP CARE: The patient be discharged home once she is awake and alert. The patient will be placed on Percocet 1-2 tablets p.o. q.6 hours p.r.n. pain, and amoxicillin 875 mg p.o. b.i.d. for 5 days.  The patient will follow up in my office in approximately 1 week for splint removal.    Teresa Heiny Raynelle Bring, MD

## 2016-07-20 NOTE — H&P (Signed)
Cc: Chronic nasal obstruction  HPI: The patient is a 35 year old female who returns today for her follow-up evaluation. The patient was last seen 1 month ago.  At that time, she was noted to have chronic rhinitis, nasal mucosal congestion, and bilateral inferior turbinate hypertrophy.  She was treated with Flonase nasal spray and nasal saline irrigation. She also underwent a CT scan.  The CT showed no acute or chronic sinusitis.  However, she is noted to have bilateral inferior turbinate hypertrophy, left concha bullosa, nasal septal deviation, and left septal spur.  The patient returns today complaining of persistent nasal obstruction and facial pressure.  She also complains of recurrent headache. She is interested in more definitive treatment of her conditions.    Exam: The nasal cavities were decongested and anesthetised with a combination of oxymetazoline and 4% lidocaine solution.  The flexible scope was inserted into the right nasal cavity.  Endoscopy of the inferior and middle meatus was performed.  Edematous mucosa was noted.  No polyp, mass, or lesion was appreciated.  NSD with spurs. Olfactory cleft was clear.  Nasopharynx was clear.  Turbinates were hypertrophied but without mass.  Incomplete response to decongestion.  The procedure was repeated on the contralateral side with similar findings.  The patient tolerated the procedure well.  Instructions were given to avoid eating or drinking for 2 hours.    Assessment: 1.  Chronic rhinitis with significant nasal mucosal congestion, nasal septal deviation, and bilateral inferior turbinate hypertrophy.  More than 95% of her nasal passageways are obstructed bilaterally.  2.  The patient's nasal obstruction has not responded to medical treatment.   Plan: 1.  The nasal endoscopy findings and the CT images are reviewed with the patient.  2.  Continue Flonase nasal spray daily.  3.  Based on the above findings, she may benefit from undergoing surgical  intervention with septoplasty, bilateral turbinate reduction and left concha bullosa resection. The risks, benefits, alternatives and details of the procedure are reviewed with the patient.  4.  The patient would like to proceed with the procedures.

## 2016-07-20 NOTE — Addendum Note (Signed)
Addendum  created 07/20/16 1252 by Skylan Lara W Casson Catena, CRNA   Charge Capture section accepted

## 2016-07-20 NOTE — Transfer of Care (Signed)
Immediate Anesthesia Transfer of Care Note  Patient: Teresa Tran  Procedure(s) Performed: Procedure(s): NASAL SEPTOPLASTY WITH BILATERAL TURBINATE REDUCTION (N/A) ENDOSCOPIC LEFT  CONCHA BULLOSA RESECTION (Left)  Patient Location: PACU  Anesthesia Type:General  Level of Consciousness: awake  Airway & Oxygen Therapy: Patient Spontanous Breathing and Patient connected to face mask oxygen  Post-op Assessment: Report given to RN and Post -op Vital signs reviewed and stable  Post vital signs: Reviewed and stable  Last Vitals:  Vitals:   07/20/16 0737  BP: 134/83  Pulse: 79  Resp: 18  Temp: 36.6 C    Last Pain:  Vitals:   07/20/16 0737  TempSrc: Oral      Patients Stated Pain Goal: 0 ([id number])  Complications: No apparent anesthesia complications

## 2016-07-21 ENCOUNTER — Encounter (HOSPITAL_BASED_OUTPATIENT_CLINIC_OR_DEPARTMENT_OTHER): Payer: Self-pay | Admitting: Otolaryngology

## 2016-07-22 ENCOUNTER — Ambulatory Visit (INDEPENDENT_AMBULATORY_CARE_PROVIDER_SITE_OTHER): Payer: Medicare Other | Admitting: Otolaryngology

## 2016-07-27 ENCOUNTER — Ambulatory Visit (HOSPITAL_COMMUNITY)
Admission: RE | Admit: 2016-07-27 | Discharge: 2016-07-27 | Disposition: A | Discharge status: Home / Self Care | Payer: Medicare Other | Source: Ambulatory Visit | Attending: Adult Health | Admitting: Adult Health

## 2016-07-27 DIAGNOSIS — N6312 Unspecified lump in the right breast, upper inner quadrant: Secondary | ICD-10-CM

## 2016-07-27 DIAGNOSIS — N644 Mastodynia: Secondary | ICD-10-CM

## 2016-07-27 DIAGNOSIS — N6489 Other specified disorders of breast: Secondary | ICD-10-CM | POA: Diagnosis not present

## 2016-07-27 DIAGNOSIS — R928 Other abnormal and inconclusive findings on diagnostic imaging of breast: Secondary | ICD-10-CM | POA: Diagnosis not present

## 2016-08-05 ENCOUNTER — Ambulatory Visit (INDEPENDENT_AMBULATORY_CARE_PROVIDER_SITE_OTHER): Payer: Medicare Other | Admitting: Otolaryngology

## 2016-08-05 DIAGNOSIS — G43909 Migraine, unspecified, not intractable, without status migrainosus: Secondary | ICD-10-CM | POA: Diagnosis not present

## 2016-08-06 DIAGNOSIS — E039 Hypothyroidism, unspecified: Secondary | ICD-10-CM | POA: Diagnosis not present

## 2016-08-09 DIAGNOSIS — Z23 Encounter for immunization: Secondary | ICD-10-CM | POA: Diagnosis not present

## 2016-08-09 DIAGNOSIS — E039 Hypothyroidism, unspecified: Secondary | ICD-10-CM | POA: Diagnosis not present

## 2016-08-09 DIAGNOSIS — E6609 Other obesity due to excess calories: Secondary | ICD-10-CM | POA: Diagnosis not present

## 2016-08-26 DIAGNOSIS — R42 Dizziness and giddiness: Secondary | ICD-10-CM | POA: Diagnosis not present

## 2016-08-26 DIAGNOSIS — N3001 Acute cystitis with hematuria: Secondary | ICD-10-CM | POA: Diagnosis not present

## 2016-08-26 DIAGNOSIS — H6983 Other specified disorders of Eustachian tube, bilateral: Secondary | ICD-10-CM | POA: Diagnosis not present

## 2016-08-26 DIAGNOSIS — H6503 Acute serous otitis media, bilateral: Secondary | ICD-10-CM | POA: Diagnosis not present

## 2016-08-26 DIAGNOSIS — R51 Headache: Secondary | ICD-10-CM | POA: Diagnosis not present

## 2016-09-22 DIAGNOSIS — N10 Acute tubulo-interstitial nephritis: Secondary | ICD-10-CM | POA: Diagnosis not present

## 2016-09-22 DIAGNOSIS — M545 Low back pain: Secondary | ICD-10-CM | POA: Diagnosis not present

## 2016-09-22 DIAGNOSIS — N12 Tubulo-interstitial nephritis, not specified as acute or chronic: Secondary | ICD-10-CM | POA: Diagnosis not present

## 2016-10-04 ENCOUNTER — Ambulatory Visit (INDEPENDENT_AMBULATORY_CARE_PROVIDER_SITE_OTHER): Payer: Medicare Other | Admitting: Otolaryngology

## 2016-10-04 DIAGNOSIS — H9313 Tinnitus, bilateral: Secondary | ICD-10-CM | POA: Diagnosis not present

## 2016-10-04 DIAGNOSIS — H903 Sensorineural hearing loss, bilateral: Secondary | ICD-10-CM | POA: Diagnosis not present

## 2016-10-04 DIAGNOSIS — R51 Headache: Secondary | ICD-10-CM | POA: Diagnosis not present

## 2016-10-05 ENCOUNTER — Other Ambulatory Visit (INDEPENDENT_AMBULATORY_CARE_PROVIDER_SITE_OTHER): Payer: Self-pay | Admitting: Otolaryngology

## 2016-10-06 ENCOUNTER — Other Ambulatory Visit (INDEPENDENT_AMBULATORY_CARE_PROVIDER_SITE_OTHER): Payer: Self-pay | Admitting: Otolaryngology

## 2016-10-06 DIAGNOSIS — J329 Chronic sinusitis, unspecified: Secondary | ICD-10-CM

## 2016-10-09 ENCOUNTER — Emergency Department (HOSPITAL_COMMUNITY)
Admission: EM | Admit: 2016-10-09 | Discharge: 2016-10-09 | Disposition: A | Discharge status: Home / Self Care | Payer: Medicare Other | Attending: Emergency Medicine | Admitting: Emergency Medicine

## 2016-10-09 ENCOUNTER — Encounter (HOSPITAL_COMMUNITY): Payer: Self-pay | Admitting: Emergency Medicine

## 2016-10-09 ENCOUNTER — Emergency Department (HOSPITAL_COMMUNITY): Payer: Medicare Other

## 2016-10-09 DIAGNOSIS — Z79899 Other long term (current) drug therapy: Secondary | ICD-10-CM | POA: Diagnosis not present

## 2016-10-09 DIAGNOSIS — M545 Low back pain: Secondary | ICD-10-CM | POA: Diagnosis not present

## 2016-10-09 DIAGNOSIS — R52 Pain, unspecified: Secondary | ICD-10-CM

## 2016-10-09 DIAGNOSIS — Z791 Long term (current) use of non-steroidal anti-inflammatories (NSAID): Secondary | ICD-10-CM | POA: Diagnosis not present

## 2016-10-09 DIAGNOSIS — M79662 Pain in left lower leg: Secondary | ICD-10-CM | POA: Diagnosis not present

## 2016-10-09 DIAGNOSIS — R509 Fever, unspecified: Secondary | ICD-10-CM | POA: Diagnosis not present

## 2016-10-09 DIAGNOSIS — R109 Unspecified abdominal pain: Secondary | ICD-10-CM | POA: Diagnosis not present

## 2016-10-09 LAB — CBC WITH DIFFERENTIAL/PLATELET
BASOS ABS: 0 10*3/uL (ref 0.0–0.1)
Basophils Relative: 0 %
EOS ABS: 0.1 10*3/uL (ref 0.0–0.7)
EOS PCT: 1 %
HCT: 38.1 % (ref 36.0–46.0)
Hemoglobin: 12.3 g/dL (ref 12.0–15.0)
Lymphocytes Relative: 26 %
Lymphs Abs: 1.9 10*3/uL (ref 0.7–4.0)
MCH: 27.7 pg (ref 26.0–34.0)
MCHC: 32.3 g/dL (ref 30.0–36.0)
MCV: 85.8 fL (ref 78.0–100.0)
MONO ABS: 0.5 10*3/uL (ref 0.1–1.0)
Monocytes Relative: 7 %
Neutro Abs: 4.9 10*3/uL (ref 1.7–7.7)
Neutrophils Relative %: 66 %
PLATELETS: 217 10*3/uL (ref 150–400)
RBC: 4.44 MIL/uL (ref 3.87–5.11)
RDW: 13.8 % (ref 11.5–15.5)
WBC: 7.5 10*3/uL (ref 4.0–10.5)

## 2016-10-09 LAB — URINALYSIS, ROUTINE W REFLEX MICROSCOPIC
BILIRUBIN URINE: NEGATIVE
GLUCOSE, UA: NEGATIVE mg/dL
HGB URINE DIPSTICK: NEGATIVE
KETONES UR: NEGATIVE mg/dL
Leukocytes, UA: NEGATIVE
Nitrite: NEGATIVE
PROTEIN: NEGATIVE mg/dL
Specific Gravity, Urine: 1.02 (ref 1.005–1.030)
pH: 6 (ref 5.0–8.0)

## 2016-10-09 LAB — COMPREHENSIVE METABOLIC PANEL
ALT: 18 U/L (ref 14–54)
AST: 18 U/L (ref 15–41)
Albumin: 3.7 g/dL (ref 3.5–5.0)
Alkaline Phosphatase: 81 U/L (ref 38–126)
Anion gap: 6 (ref 5–15)
BUN: 13 mg/dL (ref 6–20)
CHLORIDE: 103 mmol/L (ref 101–111)
CO2: 28 mmol/L (ref 22–32)
CREATININE: 0.65 mg/dL (ref 0.44–1.00)
Calcium: 8.9 mg/dL (ref 8.9–10.3)
GFR calc non Af Amer: >60 mL/min (ref >60)
Glucose, Bld: 111 mg/dL — ABNORMAL HIGH (ref 65–99)
POTASSIUM: 4.1 mmol/L (ref 3.5–5.1)
SODIUM: 137 mmol/L (ref 135–145)
Total Bilirubin: 0.4 mg/dL (ref 0.3–1.2)
Total Protein: 7.4 g/dL (ref 6.5–8.1)

## 2016-10-09 LAB — I-STAT BETA HCG BLOOD, ED (MC, WL, AP ONLY)

## 2016-10-09 MED ORDER — TRAMADOL HCL 50 MG PO TABS: 50.0000 mg | ORAL_TABLET | Freq: Four times a day (QID) | ORAL | 0 refills | Status: DC | PRN

## 2016-10-09 MED ORDER — ONDANSETRON 4 MG PO TBDP: ORAL_TABLET | ORAL | 0 refills | Status: DC

## 2016-10-09 NOTE — ED Triage Notes (Signed)
Pt reports she was tx for a UTI 4 weeks ago, tx for a "kidney infection" at [facility] X 2 weeks ago. Finished home antibx without improvement. States intermittent fever, frequency, urgency, and N/.

## 2016-10-09 NOTE — ED Provider Notes (Signed)
I assumed care of this patient from Dr. Roderic Palau at 1500.  Please see their note for further details of Hx, PE.  Briefly patient is a 35 y.o. female with a Flank Pain  that is concerning for renal stone.  Current plan is to follow up on CT.  CT without evidence of acute process including pyelonephritis, renal stone. Did note some incidental findings that were communicated with the patient. Patient is to follow up with urology as needed. Patient also was instructed by the outgoing physician to follow up tomorrow for DVT study.  Disposition: Discharge  Condition: Good  I have discussed the results, Dx and Tx plan with the patient who expressed understanding and agree(s) with the plan. Discharge instructions discussed at great length. The patient was given strict return precautions who verbalized understanding of the instructions. No further questions at time of discharge.    New Prescriptions   ONDANSETRON (ZOFRAN ODT) 4 MG DISINTEGRATING TABLET    4mg  ODT q4 hours prn nausea/vomit   TRAMADOL (ULTRAM) 50 MG TABLET    Take 1 tablet (50 mg total) by mouth every 6 (six) hours as needed.    Follow Up: [facility] [address] [place] [id number] Schedule an appointment as soon as possible for a visit          Fatima Blank, MD 10/09/16 [phone]

## 2016-10-09 NOTE — Discharge Instructions (Signed)
Follow-up with [facility] for your urinary symptoms. Follow-up tomorrow for ultrasound your leg to check for DVT.  We note some incidental findings on her CT scan that are listed below. Please follow-up with urology for the kidney findings and gynecology for the uterine fibroids. 1. 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  2. Uterine fibroid.

## 2016-10-09 NOTE — ED Provider Notes (Signed)
[facility] DEPT Provider Note   CSN: [id number] Arrival date & time: 10/09/16  1341     History   Chief Complaint Chief Complaint  Patient presents with  . Flank Pain    HPI Teresa Tran is a 35 y.o. female.  Patient states that she has been treated twice in the last month for urinary tract infection. She states that she started again with frequency. Patient also complains of lower back pain. Patient has been having sinus infections and has seen Dr. Lutricia Feil and will get a CT scan of her face and sinuses to more   The history is provided by the patient. No language interpreter was used.  Flank Pain  This is a recurrent problem. The current episode started 6 to 12 hours ago. The problem occurs daily. The problem has not changed since onset.Pertinent negatives include no chest pain, no abdominal pain and no headaches. Nothing aggravates the symptoms. Nothing relieves the symptoms. She has tried nothing for the symptoms. The treatment provided no relief.    Past Medical History:  Diagnosis Date  . Anemia   . Bipolar disorder ([place])   . BV (bacterial vaginosis) 08/15/2014  . Depression   . Headache(784.0)   . Low back pain 08/15/2014  . Obesity   . Thyroid disease   . Ulcer ([place])   . Urinary frequency 08/30/2014  . Vaginal discharge 08/15/2014  . Vaginal irritation 02/12/2014  . Yeast infection 02/12/2014    Patient Active Problem List   Diagnosis Date Noted  . Well woman exam with routine gynecological exam 07/14/2016  . Breast tenderness in female 07/14/2016  . Breast lump on right side at 1 o'clock position 07/14/2016  . Upper airway cough syndrome 12/15/2015  . Gastric ulceration   . Dysphagia, pharyngoesophageal phase   . Gastric ulcer 09/11/2014  . Normocytic anemia 09/11/2014  . Urinary frequency 08/30/2014  . Vaginal discharge 08/15/2014  . BV (bacterial vaginosis) 08/15/2014  . Low back pain 08/15/2014  . PUD (peptic ulcer disease) 06/06/2014  . Vaginal irritation  02/12/2014  . Yeast infection 02/12/2014  . Dysphagia 01/30/2014  . Loose stools 01/30/2014  . N&V (nausea and vomiting) 01/30/2014  . Yeast infection of the vagina 01/09/2014  . RUQ pain 12/20/2013  . Unspecified constipation 12/20/2013  . Unspecified vitamin D deficiency 02/19/2013  . Nightmares associated with chronic post-traumatic stress disorder 02/19/2013  . 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 Weight gain due to medication 10/11/2012  . Bipolar 1 disorder ([place]) 03/23/2012  . PTSD (post-traumatic stress disorder) 03/23/2012    Past Surgical History:  Procedure Laterality Date  . CHOLECYSTECTOMY  Feb 2015   Dr. Ladona Horns, [place]  . ENDOSCOPIC CONCHA BULLOSA RESECTION Left 07/20/2016   Procedure: ENDOSCOPIC LEFT  CONCHA BULLOSA RESECTION;  Surgeon: Leta Baptist, MD;  Location: [facility];  Service: ENT;  Laterality: Left;  . ESOPHAGOGASTRODUODENOSCOPY N/A 06/11/2014   [id number] DUODENITIS/MODERATE EROSIVE GASTRICTIS IN ANTRUM/PARTIALLY HEALED ULCERS  . ESOPHAGOGASTRODUODENOSCOPY N/A 05/06/2015   Procedure: ESOPHAGOGASTRODUODENOSCOPY (EGD);  Surgeon: Danie Binder, MD;  [facility];  Service: Endoscopy;  Laterality: N/A;  1300 - moved to 1:15 - office to notify  . ESOPHAGOGASTRODUODENOSCOPY (EGD) WITH ESOPHAGEAL DILATION N/A 02/11/2014   Dr. 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 EXCISION CHONCHA BULLOSA N/A 07/20/2016   Procedure: NASAL SEPTOPLASTY WITH BILATERAL TURBINATE REDUCTION;  Surgeon: Leta Baptist, MD;  Location: [facility];  Service: ENT;  Laterality:  N/A;  . RHINOPLASTY    . SAVORY DILATION N/A 05/06/2015   Procedure: SAVORY DILATION;  Surgeon: Danie Binder, MD;  [facility];  Service: Endoscopy;  Laterality: N/A;  . TUBAL LIGATION      OB History    Gravida Para Term Preterm AB Living   2 2       2    SAB TAB Ectopic Multiple Live Births           2       Home  Medications    Prior to Admission medications   Medication Sig Start Date End Date Taking? Authorizing Provider  fluticasone (FLONASE) 50 MCG/ACT nasal spray Place 1 spray into both nostrils daily as needed for allergies or rhinitis.   Yes Historical Provider, MD  ibuprofen (ADVIL,MOTRIN) 200 MG tablet Take 800 mg by mouth every 6 (six) hours as needed for moderate pain.   Yes Historical Provider, MD  ondansetron (ZOFRAN ODT) 4 MG disintegrating tablet 66m ODT q4 hours prn nausea/vomit 10/09/16   JMilton Ferguson MD  oxyCODONE-acetaminophen (ROXICET) 5-325 MG tablet Take 1-2 tablets by mouth every 6 (six) hours as needed for severe pain. Patient not taking: Reported on 10/09/2016 07/20/16   SLeta Baptist MD  traMADol (ULTRAM) 50 MG tablet Take 1 tablet (50 mg total) by mouth every 6 (six) hours as needed. 10/09/16   JMilton Ferguson MD    Family History Family History  Problem Relation Age of Onset  . Depression Father   . Anxiety disorder Father   . Depression Sister   . Anxiety disorder Sister   . Cancer Sister     cervical  . Depression Brother   . Anxiety disorder Brother   . Cancer - Other Paternal Grandfather     prostate  . Kidney disease Mother   . Diabetes Mother   . Cancer Maternal Grandmother     breast  . Cancer Paternal Grandmother     bladder  . ADD / ADHD Neg Hx   . Alcohol abuse Neg Hx   . Drug abuse Neg Hx   . Bipolar disorder Neg Hx   . Dementia Neg Hx   . OCD Neg Hx   . Paranoid behavior Neg Hx   . Schizophrenia Neg Hx   . Seizures Neg Hx   . Sexual abuse Neg Hx   . Physical abuse Neg Hx   . Colon cancer Neg Hx     Social History Social History  Substance Use Topics  . Smoking status: Never Smoker  . Smokeless tobacco: Never Used  . Alcohol use No     Allergies   Risperdal [risperidone] and Macrobid [nitrofurantoin monohyd macro]   Review of Systems Review of Systems  Constitutional: Negative for appetite change and fatigue.  HENT: Negative for  congestion, ear discharge and sinus pressure.   Eyes: Negative for discharge.  Respiratory: Negative for cough.   Cardiovascular: Negative for chest pain.  Gastrointestinal: Negative for abdominal pain and diarrhea.  Genitourinary: Positive for flank pain. Negative for frequency and hematuria.  Musculoskeletal: Negative for back pain.  Skin: Negative for rash.  Neurological: Negative for seizures and headaches.  Psychiatric/Behavioral: Negative for hallucinations.     Physical Exam Updated Vital Signs BP 148/75   Pulse 96   Temp 97.9 F (36.6 C) (Oral)   Resp 16   Ht 5' (1.524 m)   Wt 200 lb (90.7 kg)   LMP 09/24/2016   SpO2 100%   BMI  39.06 kg/m   Physical Exam  Constitutional: She is oriented to person, place, and time. She appears well-developed.  HENT:  Head: Normocephalic.  Eyes: Conjunctivae are normal.  Neck: No tracheal deviation present.  Cardiovascular:  No murmur heard. Genitourinary:  Genitourinary Comments: Mild right flank tenderness  Musculoskeletal: Normal range of motion.  Mild left calf tenderness  Neurological: She is oriented to person, place, and time.  Skin: Skin is warm.  Psychiatric: She has a normal mood and affect.     ED Treatments / Results  Labs (all labs ordered are listed, but only abnormal results are displayed) Labs Reviewed  COMPREHENSIVE METABOLIC PANEL - Abnormal; Notable for the following:       Result Value   Glucose, Bld 111 (*)    All other components within normal limits  URINE CULTURE  URINALYSIS, ROUTINE W REFLEX MICROSCOPIC (NOT AT [facility])  CBC WITH DIFFERENTIAL/PLATELET  I-STAT BETA HCG BLOOD, ED ([facility], [facility], [facility] ONLY)    EKG  EKG Interpretation None       Radiology No results found.  Procedures Procedures (including critical care time)  Medications Ordered in ED Medications - No data to display   Initial Impression / Assessment and Plan / ED Course  I have reviewed the triage vital signs and the  nursing notes.  Pertinent labs & imaging results that were available during my care of the patient were reviewed by me and considered in my medical decision making (see chart for details).  Clinical Course    Patient with urinary frequency and back pain. Urinalysis CBC as he met unremarkable. Patient will get CT scan renal. Patient will be referred to urology depending on what is found on CT scan. She is also getting an ultrasound of her left calf for DVT  Final Clinical Impressions(s) / ED Diagnoses   Final diagnoses:  Pain    New Prescriptions New Prescriptions   ONDANSETRON (ZOFRAN ODT) 4 MG DISINTEGRATING TABLET    61m ODT q4 hours prn nausea/vomit   TRAMADOL (ULTRAM) 50 MG TABLET    Take 1 tablet (50 mg total) by mouth every 6 (six) hours as needed.     JMilton Ferguson MD 10/09/16 [phone]

## 2016-10-10 ENCOUNTER — Inpatient Hospital Stay (HOSPITAL_COMMUNITY): Admission: RE | Admit: 2016-10-10 | Payer: Self-pay | Source: Ambulatory Visit

## 2016-10-10 ENCOUNTER — Ambulatory Visit (HOSPITAL_COMMUNITY)
Admission: RE | Admit: 2016-10-10 | Discharge: 2016-10-10 | Disposition: A | Discharge status: Home / Self Care | Payer: Medicare Other | Source: Ambulatory Visit | Attending: Emergency Medicine | Admitting: Emergency Medicine

## 2016-10-10 ENCOUNTER — Other Ambulatory Visit (HOSPITAL_COMMUNITY): Payer: Self-pay | Admitting: Emergency Medicine

## 2016-10-10 DIAGNOSIS — J329 Chronic sinusitis, unspecified: Secondary | ICD-10-CM | POA: Diagnosis not present

## 2016-10-10 DIAGNOSIS — M79605 Pain in left leg: Secondary | ICD-10-CM | POA: Diagnosis not present

## 2016-10-11 ENCOUNTER — Ambulatory Visit (HOSPITAL_COMMUNITY)
Admission: RE | Admit: 2016-10-11 | Discharge: 2016-10-11 | Disposition: A | Discharge status: Home / Self Care | Payer: Medicare Other | Source: Ambulatory Visit | Attending: Otolaryngology | Admitting: Otolaryngology

## 2016-10-11 DIAGNOSIS — M79605 Pain in left leg: Secondary | ICD-10-CM | POA: Diagnosis not present

## 2016-10-11 DIAGNOSIS — J329 Chronic sinusitis, unspecified: Secondary | ICD-10-CM | POA: Diagnosis not present

## 2016-10-11 LAB — URINE CULTURE: Culture: 50000 — AB

## 2016-10-12 ENCOUNTER — Telehealth (HOSPITAL_BASED_OUTPATIENT_CLINIC_OR_DEPARTMENT_OTHER): Payer: Self-pay | Admitting: Emergency Medicine

## 2016-10-12 DIAGNOSIS — R350 Frequency of micturition: Secondary | ICD-10-CM | POA: Diagnosis not present

## 2016-10-12 NOTE — Telephone Encounter (Signed)
Post ED Visit - Positive Culture Follow-up  Culture report reviewed by antimicrobial stewardship pharmacist:  []  Elenor Quinones, Pharm.D. []  Heide Guile, Pharm.D., BCPS []  Parks Neptune, Pharm.D. []  Alycia Rossetti, Pharm.D., BCPS []  [place], [place].D., BCPS, AAHIVP []  Legrand Como, Pharm.D., BCPS, AAHIVP []  Milus Glazier, Pharm.D. []  [place] November, [place].D.  Positive urine culture Treated with none, asymptomatic,no further patient follow-up is required at this time.  Hazle Nordmann 10/12/2016, 10:06 AM

## 2016-10-19 DIAGNOSIS — M791 Myalgia: Secondary | ICD-10-CM | POA: Diagnosis not present

## 2016-10-19 DIAGNOSIS — H9193 Unspecified hearing loss, bilateral: Secondary | ICD-10-CM | POA: Diagnosis not present

## 2016-10-19 DIAGNOSIS — R51 Headache: Secondary | ICD-10-CM | POA: Diagnosis not present

## 2016-10-19 DIAGNOSIS — G629 Polyneuropathy, unspecified: Secondary | ICD-10-CM | POA: Diagnosis not present

## 2016-10-19 DIAGNOSIS — R7301 Impaired fasting glucose: Secondary | ICD-10-CM | POA: Diagnosis not present

## 2016-10-19 DIAGNOSIS — I7300 Raynaud's syndrome without gangrene: Secondary | ICD-10-CM | POA: Diagnosis not present

## 2016-10-19 DIAGNOSIS — Z6841 Body Mass Index (BMI) 40.0 and over, adult: Secondary | ICD-10-CM | POA: Diagnosis not present

## 2016-10-19 DIAGNOSIS — R109 Unspecified abdominal pain: Secondary | ICD-10-CM | POA: Diagnosis not present

## 2016-10-19 DIAGNOSIS — S30860A Insect bite (nonvenomous) of lower back and pelvis, initial encounter: Secondary | ICD-10-CM | POA: Diagnosis not present

## 2016-10-19 DIAGNOSIS — M2550 Pain in unspecified joint: Secondary | ICD-10-CM | POA: Diagnosis not present

## 2016-10-19 DIAGNOSIS — F319 Bipolar disorder, unspecified: Secondary | ICD-10-CM | POA: Diagnosis not present

## 2016-10-25 ENCOUNTER — Emergency Department (HOSPITAL_COMMUNITY): Payer: Medicare Other

## 2016-10-25 ENCOUNTER — Encounter (HOSPITAL_COMMUNITY): Payer: Self-pay | Admitting: Emergency Medicine

## 2016-10-25 ENCOUNTER — Emergency Department (HOSPITAL_COMMUNITY)
Admission: EM | Admit: 2016-10-25 | Discharge: 2016-10-25 | Disposition: A | Discharge status: Home / Self Care | Payer: Medicare Other | Attending: Emergency Medicine | Admitting: Emergency Medicine

## 2016-10-25 DIAGNOSIS — R51 Headache: Secondary | ICD-10-CM | POA: Insufficient documentation

## 2016-10-25 DIAGNOSIS — M542 Cervicalgia: Secondary | ICD-10-CM | POA: Diagnosis not present

## 2016-10-25 DIAGNOSIS — R519 Headache, unspecified: Secondary | ICD-10-CM

## 2016-10-25 MED ORDER — ONDANSETRON 4 MG PO TBDP: ORAL_TABLET | ORAL | 0 refills | Status: DC

## 2016-10-25 MED ORDER — DIPHENHYDRAMINE HCL 50 MG/ML IJ SOLN
25.0000 mg | Freq: Once | INTRAMUSCULAR | Status: AC
  Administered 2016-10-25: 25 mg via INTRAVENOUS
  Filled 2016-10-25: qty 1

## 2016-10-25 MED ORDER — HYDROCODONE-ACETAMINOPHEN 5-325 MG PO TABS: 1.0000 | ORAL_TABLET | Freq: Four times a day (QID) | ORAL | 0 refills | Status: DC | PRN

## 2016-10-25 MED ORDER — ONDANSETRON 4 MG PO TBDP
4.0000 mg | ORAL_TABLET | Freq: Once | ORAL | Status: AC
  Administered 2016-10-25: 4 mg via ORAL
  Filled 2016-10-25: qty 1

## 2016-10-25 MED ORDER — METOCLOPRAMIDE HCL 5 MG/ML IJ SOLN
10.0000 mg | Freq: Once | INTRAMUSCULAR | Status: AC
  Administered 2016-10-25: 10 mg via INTRAVENOUS
  Filled 2016-10-25: qty 2

## 2016-10-25 MED ORDER — KETOROLAC TROMETHAMINE 30 MG/ML IJ SOLN
30.0000 mg | Freq: Once | INTRAMUSCULAR | Status: AC
Start: 2016-10-25 — End: 2016-10-25
  Administered 2016-10-25: 30 mg via INTRAVENOUS
  Filled 2016-10-25: qty 1

## 2016-10-25 NOTE — ED Provider Notes (Signed)
[facility] DEPT Provider Note   CSN: [id number] Arrival date & time: 10/25/16  1707     History   Chief Complaint Chief Complaint  Patient presents with  . Headache    HPI Teresa Tran is a 35 y.o. female.  Patient states that she's been having headaches off and on for months. She also has had some neck discomfort for months and has seen ENT and her family doctor. She has been referred to neurology and has an appointment January 3.  She states her headache seems worse today no fever no chills   The history is provided by the patient. No language interpreter was used.  Headache   This is a recurrent problem. The current episode started more than 1 week ago. The problem occurs constantly. The problem has not changed since onset.The headache is associated with nothing. The pain is located in the occipital region. The quality of the pain is described as dull. The pain is at a severity of 6/10. The pain does not radiate.    Past Medical History:  Diagnosis Date  . Anemia   . Bipolar disorder ([place])   . BV (bacterial vaginosis) 08/15/2014  . Depression   . Headache(784.0)   . Low back pain 08/15/2014  . Obesity   . Thyroid disease   . Ulcer ([place])   . Urinary frequency 08/30/2014  . Vaginal discharge 08/15/2014  . Vaginal irritation 02/12/2014  . Yeast infection 02/12/2014    Patient Active Problem List   Diagnosis Date Noted  . Well woman exam with routine gynecological exam 07/14/2016  . Breast tenderness in female 07/14/2016  . Breast lump on right side at 1 o'clock position 07/14/2016  . Upper airway cough syndrome 12/15/2015  . Gastric ulceration   . Dysphagia, pharyngoesophageal phase   . Gastric ulcer 09/11/2014  . Normocytic anemia 09/11/2014  . Urinary frequency 08/30/2014  . Vaginal discharge 08/15/2014  . BV (bacterial vaginosis) 08/15/2014  . Low back pain 08/15/2014  . PUD (peptic ulcer disease) 06/06/2014  . Vaginal irritation 02/12/2014  . Yeast  infection 02/12/2014  . Dysphagia 01/30/2014  . Loose stools 01/30/2014  . N&V (nausea and vomiting) 01/30/2014  . Yeast infection of the vagina 01/09/2014  . RUQ pain 12/20/2013  . Unspecified constipation 12/20/2013  . Unspecified vitamin D deficiency 02/19/2013  . Nightmares associated with chronic post-traumatic stress disorder 02/19/2013  . 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 Weight gain due to medication 10/11/2012  . Bipolar 1 disorder ([place]) 03/23/2012  . PTSD (post-traumatic stress disorder) 03/23/2012    Past Surgical History:  Procedure Laterality Date  . CHOLECYSTECTOMY  Feb 2015   Dr. Ladona Horns, [place]  . ENDOSCOPIC CONCHA BULLOSA RESECTION Left 07/20/2016   Procedure: ENDOSCOPIC LEFT  CONCHA BULLOSA RESECTION;  Surgeon: Leta Baptist, MD;  Location: [facility];  Service: ENT;  Laterality: Left;  . 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 ESOPHAGOGASTRODUODENOSCOPY N/A 05/06/2015   Procedure: ESOPHAGOGASTRODUODENOSCOPY (EGD);  Surgeon: Danie Binder, MD;  [facility];  Service: Endoscopy;  Laterality: N/A;  1300 - moved to 1:15 - office to notify  . ESOPHAGOGASTRODUODENOSCOPY (EGD) WITH ESOPHAGEAL DILATION N/A 02/11/2014   Dr. 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 EXCISION CHONCHA BULLOSA N/A 07/20/2016   Procedure: NASAL SEPTOPLASTY WITH BILATERAL TURBINATE REDUCTION;  Surgeon: Leta Baptist, MD;  Location: [facility];  Service: ENT;  Laterality: N/A;  . RHINOPLASTY    . SAVORY DILATION N/A 05/06/2015   Procedure: SAVORY DILATION;  Surgeon: Danie Binder, MD;  [facility];  Service: Endoscopy;  Laterality: N/A;  . TUBAL LIGATION      OB History    Gravida Para Term Preterm AB Living   2 2       2    SAB TAB Ectopic Multiple Live Births           2       Home Medications    Prior to  Admission medications   Medication Sig Start Date End Date Taking? Authorizing Provider  ibuprofen (ADVIL,MOTRIN) 200 MG tablet Take 800 mg by mouth every 6 (six) hours as needed for moderate pain.   Yes Historical Provider, MD  fluticasone (FLONASE) 50 MCG/ACT nasal spray Place 1 spray into both nostrils daily as needed for allergies or rhinitis.    Historical Provider, MD  HYDROcodone-acetaminophen (NORCO/VICODIN) 5-325 MG tablet Take 1 tablet by mouth every 6 (six) hours as needed for moderate pain. 10/25/16   Milton Ferguson, MD  ondansetron (ZOFRAN ODT) 4 MG disintegrating tablet 4mg  ODT q4 hours prn nausea/vomit 10/25/16   Milton Ferguson, MD  oxyCODONE-acetaminophen (ROXICET) 5-325 MG tablet Take 1-2 tablets by mouth every 6 (six) hours as needed for severe pain. Patient not taking: Reported on 10/09/2016 07/20/16   Leta Baptist, MD  traMADol (ULTRAM) 50 MG tablet Take 1 tablet (50 mg total) by mouth every 6 (six) hours as needed. Patient not taking: Reported on 10/25/2016 10/09/16   Milton Ferguson, MD    Family History Family History  Problem Relation Age of Onset  . Depression Father   . Anxiety disorder Father   . Depression Sister   . Anxiety disorder Sister   . Cancer Sister     cervical  . Depression Brother   . Anxiety disorder Brother   . Cancer - Other Paternal Grandfather     prostate  . Kidney disease Mother   . Diabetes Mother   . Cancer Maternal Grandmother     breast  . Cancer Paternal Grandmother     bladder  . ADD / ADHD Neg Hx   . Alcohol abuse Neg Hx   . Drug abuse Neg Hx   . Bipolar disorder Neg Hx   . Dementia Neg Hx   . OCD Neg Hx   . Paranoid behavior Neg Hx   . Schizophrenia Neg Hx   . Seizures Neg Hx   . Sexual abuse Neg Hx   . Physical abuse Neg Hx   . Colon cancer Neg Hx     Social History Social History  Substance Use Topics  . Smoking status: Never Smoker  . Smokeless tobacco: Never Used  . Alcohol use No     Allergies   Risperdal  [risperidone] and Macrobid [nitrofurantoin monohyd macro]   Review of Systems Review of Systems  Constitutional: Negative for appetite change and fatigue.  HENT: Negative for congestion, ear discharge and sinus pressure.   Eyes: Negative for discharge.  Respiratory: Negative for cough.   Cardiovascular: Negative for chest pain.  Gastrointestinal: Negative for abdominal pain and diarrhea.  Genitourinary: Negative for frequency and hematuria.  Musculoskeletal: Negative for back pain.  Skin: Negative for rash.  Neurological: Positive for headaches. Negative for seizures.  Psychiatric/Behavioral: Negative for hallucinations.     Physical Exam Updated Vital Signs BP 143/82 (BP Location: Right Arm)   Pulse 90  Temp 98.2 F (36.8 C) (Oral)   Resp 24   Ht 5' (1.524 m)   Wt 200 lb (90.7 kg)   LMP 09/24/2016   SpO2 100%   BMI 39.06 kg/m   Physical Exam  Constitutional: She is oriented to person, place, and time. She appears well-developed.  HENT:  Head: Normocephalic.  Neck supple  Eyes: Conjunctivae and EOM are normal. No scleral icterus.  Disc  sharp  Neck: Neck supple. No thyromegaly present.  Cardiovascular: Normal rate and regular rhythm.  Exam reveals no gallop and no friction rub.   No murmur heard. Pulmonary/Chest: No stridor. She has no wheezes. She has no rales. She exhibits no tenderness.  Abdominal: She exhibits no distension. There is no tenderness. There is no rebound.  Musculoskeletal: Normal range of motion. She exhibits no edema.  Lymphadenopathy:    She has no cervical adenopathy.  Neurological: She is oriented to person, place, and time. She exhibits normal muscle tone. Coordination normal.  Cranial nerves normal.  Skin: No rash noted. No erythema.  Psychiatric: She has a normal mood and affect. Her behavior is normal.     ED Treatments / Results  Labs (all labs ordered are listed, but only abnormal results are displayed) Labs Reviewed - No data to  display  EKG  EKG Interpretation None       Radiology Ct Head Wo Contrast  Result Date: 10/25/2016 CLINICAL DATA:  Headache and pressure for 2 months. Posterior neck pain and soreness. No known injury. EXAM: CT HEAD WITHOUT CONTRAST CT CERVICAL SPINE WITHOUT CONTRAST TECHNIQUE: Multidetector CT imaging of the head and cervical spine was performed following the standard protocol without intravenous contrast. Multiplanar CT image reconstructions of the cervical spine were also generated. COMPARISON:  Brain MRI 06/02/2016 FINDINGS: CT HEAD FINDINGS Brain: Appears normal without hemorrhage, infarct, mass lesion, mass effect, midline shift or abnormal extra-axial fluid collection. No hydrocephalus or pneumocephalus. Vascular: Negative. Skull: Intact.  No focal lesion. Sinuses/Orbits: Negative. Other: None. CT CERVICAL SPINE FINDINGS Alignment: Normal. Skull base and vertebrae: No acute fracture. No primary bone lesion or focal pathologic process. Soft tissues and spinal canal: No prevertebral fluid or swelling. No visible canal hematoma. Disc levels:  Negative. Upper chest: Lung apices clear. Other: None. IMPRESSION: Negative head and cervical spine CT scans. Electronically Signed   By: Inge Rise M.D.   On: 10/25/2016 21:19   Ct Cervical Spine Wo Contrast  Result Date: 10/25/2016 CLINICAL DATA:  Headache and pressure for 2 months. Posterior neck pain and soreness. No known injury. EXAM: CT HEAD WITHOUT CONTRAST CT CERVICAL SPINE WITHOUT CONTRAST TECHNIQUE: Multidetector CT imaging of the head and cervical spine was performed following the standard protocol without intravenous contrast. Multiplanar CT image reconstructions of the cervical spine were also generated. COMPARISON:  Brain MRI 06/02/2016 FINDINGS: CT HEAD FINDINGS Brain: Appears normal without hemorrhage, infarct, mass lesion, mass effect, midline shift or abnormal extra-axial fluid collection. No hydrocephalus or pneumocephalus.  Vascular: Negative. Skull: Intact.  No focal lesion. Sinuses/Orbits: Negative. Other: None. CT CERVICAL SPINE FINDINGS Alignment: Normal. Skull base and vertebrae: No acute fracture. No primary bone lesion or focal pathologic process. Soft tissues and spinal canal: No prevertebral fluid or swelling. No visible canal hematoma. Disc levels:  Negative. Upper chest: Lung apices clear. Other: None. IMPRESSION: Negative head and cervical spine CT scans. Electronically Signed   By: Inge Rise M.D.   On: 10/25/2016 21:19    Procedures Procedures (including critical care time)  Medications  Ordered in ED Medications  ondansetron (ZOFRAN-ODT) disintegrating tablet 4 mg (4 mg Oral Given 10/25/16 1808)  metoCLOPramide (REGLAN) injection 10 mg (10 mg Intravenous Given 10/25/16 2002)  diphenhydrAMINE (BENADRYL) injection 25 mg (25 mg Intravenous Given 10/25/16 2001)  ketorolac (TORADOL) 30 MG/ML injection 30 mg (30 mg Intravenous Given 10/25/16 2002)     Initial Impression / Assessment and Plan / ED Course  I have reviewed the triage vital signs and the nursing notes.  Pertinent labs & imaging results that were available during my care of the patient were reviewed by me and considered in my medical decision making (see chart for details).  Clinical Course     Patient with chronic headache for months. CT scan unremarkable. Patient improved with migraine cocktail. She was instructed to keep her appointment with the neurologist  Final Clinical Impressions(s) / ED Diagnoses   Final diagnoses:  Headache disorder    New Prescriptions New Prescriptions   HYDROCODONE-ACETAMINOPHEN (NORCO/VICODIN) 5-325 MG TABLET    Take 1 tablet by mouth every 6 (six) hours as needed for moderate pain.   ONDANSETRON (ZOFRAN ODT) 4 MG DISINTEGRATING TABLET    4mg  ODT q4 hours prn nausea/vomit     Milton Ferguson, MD 10/25/16 2136

## 2016-10-25 NOTE — ED Triage Notes (Signed)
Pt is very nausea, orders placed

## 2016-10-25 NOTE — Discharge Instructions (Signed)
Follow up with your neurologist as planned

## 2016-10-25 NOTE — ED Triage Notes (Signed)
Having headache, stiff neck is not new, has appointment with Neurologist, states she cant wait, feels ill and nausea. Pt was treated for [facility] spotted fever in June and has multiple symptoms since.

## 2016-10-27 ENCOUNTER — Ambulatory Visit: Payer: Medicare Other | Admitting: Adult Health

## 2016-10-28 ENCOUNTER — Other Ambulatory Visit (HOSPITAL_COMMUNITY): Payer: Self-pay | Admitting: Neurology

## 2016-10-28 DIAGNOSIS — F319 Bipolar disorder, unspecified: Secondary | ICD-10-CM | POA: Diagnosis not present

## 2016-10-28 DIAGNOSIS — G4733 Obstructive sleep apnea (adult) (pediatric): Secondary | ICD-10-CM | POA: Diagnosis not present

## 2016-10-28 DIAGNOSIS — G4452 New daily persistent headache (NDPH): Secondary | ICD-10-CM | POA: Diagnosis not present

## 2016-10-28 DIAGNOSIS — H9319 Tinnitus, unspecified ear: Secondary | ICD-10-CM

## 2016-10-28 DIAGNOSIS — F419 Anxiety disorder, unspecified: Secondary | ICD-10-CM | POA: Diagnosis not present

## 2016-10-28 DIAGNOSIS — G43701 Chronic migraine without aura, not intractable, with status migrainosus: Secondary | ICD-10-CM | POA: Diagnosis not present

## 2016-10-28 DIAGNOSIS — M545 Low back pain: Secondary | ICD-10-CM | POA: Diagnosis not present

## 2016-10-28 DIAGNOSIS — R519 Headache, unspecified: Secondary | ICD-10-CM

## 2016-10-28 DIAGNOSIS — F3340 Major depressive disorder, recurrent, in remission, unspecified: Secondary | ICD-10-CM | POA: Diagnosis not present

## 2016-10-28 DIAGNOSIS — H538 Other visual disturbances: Secondary | ICD-10-CM

## 2016-10-28 DIAGNOSIS — F4310 Post-traumatic stress disorder, unspecified: Secondary | ICD-10-CM | POA: Diagnosis not present

## 2016-10-28 DIAGNOSIS — R51 Headache: Principal | ICD-10-CM

## 2016-10-29 ENCOUNTER — Other Ambulatory Visit (HOSPITAL_BASED_OUTPATIENT_CLINIC_OR_DEPARTMENT_OTHER): Payer: Self-pay

## 2016-10-29 DIAGNOSIS — G4733 Obstructive sleep apnea (adult) (pediatric): Secondary | ICD-10-CM

## 2016-11-03 ENCOUNTER — Ambulatory Visit (HOSPITAL_COMMUNITY)
Admission: RE | Admit: 2016-11-03 | Discharge: 2016-11-03 | Disposition: A | Discharge status: Home / Self Care | Payer: Medicare Other | Source: Ambulatory Visit | Attending: Neurology | Admitting: Neurology

## 2016-11-03 DIAGNOSIS — R42 Dizziness and giddiness: Secondary | ICD-10-CM | POA: Diagnosis not present

## 2016-11-03 DIAGNOSIS — R519 Headache, unspecified: Secondary | ICD-10-CM

## 2016-11-03 DIAGNOSIS — G4452 New daily persistent headache (NDPH): Secondary | ICD-10-CM

## 2016-11-03 DIAGNOSIS — H9319 Tinnitus, unspecified ear: Secondary | ICD-10-CM | POA: Diagnosis not present

## 2016-11-03 DIAGNOSIS — H538 Other visual disturbances: Secondary | ICD-10-CM | POA: Diagnosis not present

## 2016-11-03 DIAGNOSIS — R51 Headache: Secondary | ICD-10-CM | POA: Insufficient documentation

## 2016-11-11 ENCOUNTER — Ambulatory Visit: Payer: Medicare Other | Attending: Neurology | Admitting: Neurology

## 2016-11-11 DIAGNOSIS — G43701 Chronic migraine without aura, not intractable, with status migrainosus: Secondary | ICD-10-CM | POA: Diagnosis not present

## 2016-11-11 DIAGNOSIS — G4452 New daily persistent headache (NDPH): Secondary | ICD-10-CM | POA: Diagnosis not present

## 2016-11-11 DIAGNOSIS — F3340 Major depressive disorder, recurrent, in remission, unspecified: Secondary | ICD-10-CM | POA: Diagnosis not present

## 2016-11-11 DIAGNOSIS — G4733 Obstructive sleep apnea (adult) (pediatric): Secondary | ICD-10-CM | POA: Insufficient documentation

## 2016-11-11 DIAGNOSIS — M545 Low back pain: Secondary | ICD-10-CM | POA: Diagnosis not present

## 2016-11-11 DIAGNOSIS — F319 Bipolar disorder, unspecified: Secondary | ICD-10-CM | POA: Diagnosis not present

## 2016-11-11 DIAGNOSIS — F4310 Post-traumatic stress disorder, unspecified: Secondary | ICD-10-CM | POA: Diagnosis not present

## 2016-11-11 DIAGNOSIS — F419 Anxiety disorder, unspecified: Secondary | ICD-10-CM | POA: Diagnosis not present

## 2016-11-14 NOTE — Procedures (Signed)
   Texola A. Merlene Laughter, MD     [email or web address]             NOCTURNAL POLYSOMNOGRAPHY   LOCATION: [facility]  Patient Name: Teresa Tran, Teresa Tran Date: 11/11/2016 Gender: Female D.O.B: 04-14-1981 Age (years): 35 Referring Provider: Barton Fanny NP Height (inches): 60 Interpreting Physician: Phillips Odor MD, ABSM Weight (lbs): 219 RPSGT: Peak, Robert BMI: 43 MRN: [id number] Neck Size: 16.50 CLINICAL INFORMATION Sleep Study Type: NPSG  Indication for sleep study: N/A  Epworth Sleepiness Score: 8  SLEEP STUDY TECHNIQUE As per the AASM Manual for the Scoring of Sleep and Associated Events v2.3 (April 2016) with a hypopnea requiring 4% desaturations.  The channels recorded and monitored were frontal, central and occipital EEG, electrooculogram (EOG), submentalis EMG (chin), nasal and oral airflow, thoracic and abdominal wall motion, anterior tibialis EMG, snore microphone, electrocardiogram, and pulse oximetry.  MEDICATIONS Medications self-administered by patient taken the night of the study : N/A  Current Outpatient Prescriptions:  .  fluticasone (FLONASE) 50 MCG/ACT nasal spray, Place 1 spray into both nostrils daily as needed for allergies or rhinitis., Disp: , Rfl:  .  HYDROcodone-acetaminophen (NORCO/VICODIN) 5-325 MG tablet, Take 1 tablet by mouth every 6 (six) hours as needed for moderate pain., Disp: 10 tablet, Rfl: 0 .  ibuprofen (ADVIL,MOTRIN) 200 MG tablet, Take 800 mg by mouth every 6 (six) hours as needed for moderate pain., Disp: , Rfl:  .  ondansetron (ZOFRAN ODT) 4 MG disintegrating tablet, 4mg  ODT q4 hours prn nausea/vomit, Disp: 12 tablet, Rfl: 0 .  oxyCODONE-acetaminophen (ROXICET) 5-325 MG tablet, Take 1-2 tablets by mouth every 6 (six) hours as needed for severe pain. (Patient not taking: Reported on 10/09/2016), Disp: 20 tablet, Rfl: 0 .  traMADol (ULTRAM) 50 MG tablet, Take 1 tablet (50 mg total) by mouth every 6 (six) hours as  needed. (Patient not taking: Reported on 10/25/2016), Disp: 20 tablet, Rfl: 0   SLEEP ARCHITECTURE The study was initiated at 9:54:14 PM and ended at 4:55:34 AM.  Sleep onset time was 37.4 minutes and the sleep efficiency was 67.3%. The total sleep time was 283.5 minutes.  Stage REM latency was 133.0 minutes.  The patient spent 4.76% of the night in stage N1 sleep, 76.36% in stage N2 sleep, 13.58% in stage N3 and 5.29% in REM.  Alpha intrusion was absent.  Supine sleep was 0.00%.  RESPIRATORY PARAMETERS The overall apnea/hypopnea index (AHI) was 0.6 per hour. There were 1 total apneas, including 0 obstructive, 1 central and 0 mixed apneas. There were 2 hypopneas and 7 RERAs.  The AHI during Stage REM sleep was 0.0 per hour.  AHI while supine was N/A per hour.  The mean oxygen saturation was 95.88%. The minimum SpO2 during sleep was 92.00%.  Loud snoring was noted during this study.  CARDIAC DATA The 2 lead EKG demonstrated sinus rhythm. The mean heart rate was 71.22 beats per minute. Other EKG findings include: None. LEG MOVEMENT DATA The total PLMS were 0 with a resulting PLMS index of 0.00. Associated arousal with leg movement index was 0.0.  IMPRESSIONS - No significant obstructive sleep apnea occurred during this study. - No significant central sleep apnea occurred during this study.   Delano Metz, MD Diplomate, American Board of Sleep Medicine.

## 2016-11-22 ENCOUNTER — Ambulatory Visit: Payer: Medicare Other | Admitting: Adult Health

## 2016-11-29 DIAGNOSIS — G4733 Obstructive sleep apnea (adult) (pediatric): Secondary | ICD-10-CM | POA: Diagnosis not present

## 2016-11-29 DIAGNOSIS — G43701 Chronic migraine without aura, not intractable, with status migrainosus: Secondary | ICD-10-CM | POA: Diagnosis not present

## 2016-11-29 DIAGNOSIS — F3340 Major depressive disorder, recurrent, in remission, unspecified: Secondary | ICD-10-CM | POA: Diagnosis not present

## 2016-11-29 DIAGNOSIS — M545 Low back pain: Secondary | ICD-10-CM | POA: Diagnosis not present

## 2016-11-29 DIAGNOSIS — G960 Cerebrospinal fluid leak: Secondary | ICD-10-CM | POA: Diagnosis not present

## 2016-11-29 DIAGNOSIS — F4310 Post-traumatic stress disorder, unspecified: Secondary | ICD-10-CM | POA: Diagnosis not present

## 2016-11-29 DIAGNOSIS — F319 Bipolar disorder, unspecified: Secondary | ICD-10-CM | POA: Diagnosis not present

## 2016-11-29 DIAGNOSIS — F419 Anxiety disorder, unspecified: Secondary | ICD-10-CM | POA: Diagnosis not present

## 2016-11-29 DIAGNOSIS — G4452 New daily persistent headache (NDPH): Secondary | ICD-10-CM | POA: Diagnosis not present

## 2016-11-30 ENCOUNTER — Ambulatory Visit: Payer: Medicare Other | Admitting: Adult Health

## 2016-12-02 ENCOUNTER — Ambulatory Visit (INDEPENDENT_AMBULATORY_CARE_PROVIDER_SITE_OTHER): Payer: Medicare Other | Admitting: Adult Health

## 2016-12-02 ENCOUNTER — Encounter: Payer: Self-pay | Admitting: Adult Health

## 2016-12-02 VITALS — BP 136/84 | HR 80 | Ht 60.0 in | Wt 220.0 lb

## 2016-12-02 DIAGNOSIS — N921 Excessive and frequent menstruation with irregular cycle: Secondary | ICD-10-CM | POA: Diagnosis not present

## 2016-12-02 DIAGNOSIS — R1030 Lower abdominal pain, unspecified: Secondary | ICD-10-CM

## 2016-12-02 DIAGNOSIS — D259 Leiomyoma of uterus, unspecified: Secondary | ICD-10-CM

## 2016-12-02 DIAGNOSIS — R109 Unspecified abdominal pain: Secondary | ICD-10-CM

## 2016-12-02 NOTE — Patient Instructions (Signed)

## 2016-12-02 NOTE — Progress Notes (Signed)
Subjective:     Patient ID: Teresa Tran, female   DOB: February 21, 1981, 36 y.o.   MRN: [id number]  HPI Teresa Tran is a 36 year old white female in to discuss fibroids, had 4 Cm one on CT done in ER when assessing kidney stone.She says periods heavier than used to be and has bloating and cramps, some low back pain.  Review of Systems +heavier periods Bloating Cramps Low back pain Reviewed past medical,surgical, social and family history. Reviewed medications and allergies.     Objective:   Physical Exam BP 136/84 (BP Location: Left Arm, Patient Position: Sitting, Cuff Size: Large)   Pulse 80   Ht 5' (1.524 m)   Wt 220 lb (99.8 kg)   BMI 42.97 kg/m PHQ 2 score 0. Skin warm and dry.Pelvic: external genitalia is normal in appearance no lesions, vagina: pink with good moisture and rugae,urethra has no lesions or masses noted, cervix:smooth and bulbous, uterus: normal size, shape and contour, mildly tender, no masses felt, adnexa: no masses or tenderness noted. Bladder is non tender and no masses felt.   Explained that has 4 cm fibroid, and it seems stable from 2016.Discussed could follow, myomectomy or hysterectomy or even Lupron are options, will get to discuss with Teresa Tran. Assessment:     1. Uterine leiomyoma, unspecified location   2. Abdominal cramps   3. Menorrhagia with irregular cycle       Plan:     Review handout on fibroids Return in 1 week to discuss fibroid options with Teresa Tran

## 2016-12-03 ENCOUNTER — Ambulatory Visit: Payer: Medicare Other | Admitting: Adult Health

## 2016-12-03 DIAGNOSIS — M25559 Pain in unspecified hip: Secondary | ICD-10-CM | POA: Diagnosis not present

## 2016-12-03 DIAGNOSIS — I7300 Raynaud's syndrome without gangrene: Secondary | ICD-10-CM | POA: Diagnosis not present

## 2016-12-03 DIAGNOSIS — Z6841 Body Mass Index (BMI) 40.0 and over, adult: Secondary | ICD-10-CM | POA: Diagnosis not present

## 2016-12-16 ENCOUNTER — Ambulatory Visit: Payer: Medicare Other | Admitting: Obstetrics & Gynecology

## 2016-12-19 DIAGNOSIS — R51 Headache: Secondary | ICD-10-CM | POA: Diagnosis not present

## 2016-12-19 DIAGNOSIS — R0981 Nasal congestion: Secondary | ICD-10-CM | POA: Diagnosis not present

## 2016-12-19 DIAGNOSIS — R05 Cough: Secondary | ICD-10-CM | POA: Diagnosis not present

## 2016-12-19 DIAGNOSIS — J029 Acute pharyngitis, unspecified: Secondary | ICD-10-CM | POA: Diagnosis not present

## 2016-12-22 ENCOUNTER — Encounter: Payer: Self-pay | Admitting: Nurse Practitioner

## 2016-12-22 ENCOUNTER — Ambulatory Visit (INDEPENDENT_AMBULATORY_CARE_PROVIDER_SITE_OTHER): Payer: Medicare Other | Admitting: Nurse Practitioner

## 2016-12-22 DIAGNOSIS — R197 Diarrhea, unspecified: Secondary | ICD-10-CM | POA: Diagnosis not present

## 2016-12-22 DIAGNOSIS — Z8719 Personal history of other diseases of the digestive system: Secondary | ICD-10-CM | POA: Diagnosis not present

## 2016-12-22 DIAGNOSIS — F319 Bipolar disorder, unspecified: Secondary | ICD-10-CM | POA: Diagnosis not present

## 2016-12-22 DIAGNOSIS — F4310 Post-traumatic stress disorder, unspecified: Secondary | ICD-10-CM | POA: Diagnosis not present

## 2016-12-22 DIAGNOSIS — G4452 New daily persistent headache (NDPH): Secondary | ICD-10-CM | POA: Diagnosis not present

## 2016-12-22 DIAGNOSIS — G43701 Chronic migraine without aura, not intractable, with status migrainosus: Secondary | ICD-10-CM | POA: Diagnosis not present

## 2016-12-22 DIAGNOSIS — M545 Low back pain: Secondary | ICD-10-CM | POA: Diagnosis not present

## 2016-12-22 DIAGNOSIS — Z8711 Personal history of peptic ulcer disease: Secondary | ICD-10-CM

## 2016-12-22 DIAGNOSIS — M542 Cervicalgia: Secondary | ICD-10-CM | POA: Diagnosis not present

## 2016-12-22 DIAGNOSIS — K219 Gastro-esophageal reflux disease without esophagitis: Secondary | ICD-10-CM | POA: Insufficient documentation

## 2016-12-22 DIAGNOSIS — G960 Cerebrospinal fluid leak: Secondary | ICD-10-CM | POA: Diagnosis not present

## 2016-12-22 DIAGNOSIS — G4701 Insomnia due to medical condition: Secondary | ICD-10-CM | POA: Diagnosis not present

## 2016-12-22 DIAGNOSIS — F3340 Major depressive disorder, recurrent, in remission, unspecified: Secondary | ICD-10-CM | POA: Diagnosis not present

## 2016-12-22 DIAGNOSIS — F419 Anxiety disorder, unspecified: Secondary | ICD-10-CM | POA: Diagnosis not present

## 2016-12-22 NOTE — Progress Notes (Signed)
Referring Provider: Celene Squibb, MD Primary Care Physician:  Wende Neighbors, MD Primary GI:  Dr. Oneida Alar  Chief Complaint  Patient presents with  . Gastroesophageal Reflux    cough and keeps her awake at night  . Nausea  . Emesis  . Diarrhea  . Bloated    HPI:   Teresa Tran is a 36 y.o. female who presents For persistent reflux, nausea/vomiting, diarrhea, bloating. She was last seen in our office 04/10/2015 to schedule upper endoscopy for follow-up on peptic ulcer disease and H. pylori gastritis treated with Pylera. She canceled her procedure a couple times. At her last visit she did note increased bloating which is improved with lying on a heating pad, minimal GERD typically diet related, occasional nausea, NSAIDs once a week. Occasional solid food dysphagia that time as well. She was scheduled for upper endoscopy, continue current medications, further recommendations to follow.  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 Recommend omeprazole 30 minutes daily, follow-up in 3 months. Biopsies were essentially normal. The patient did not follow up as recommended. She did have significant throat pain/swallowing pain after procedure and Gastrografin study was negative for perforation. Was provided pain medications for limited time due to pain.  Today she states she's doing ok. Has had 3 EGDs here. Has a history of H. Pylori and PUD. Last EGD was rough, had swallowing issues. Reflux never really improved after EGD. ZThe last "few months have been really intense" but has had other medical problems she's been dealing with. Also has chronic cough possibly viral cough she's had vs. GERD. 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 Has reflux symptoms about every day. She is not on any PPI currently, was previously given Protonix by PCP which didn't help so she stopped it. Notes rare scant toilet tissue hematochezia about  1-2 times a month with a history of hemorrhoids.. Denies melena. Takes Ibuprofen as needed about every other day. Is also having diarrhea a lot and was told that could be caused by her uterine fibroids. Majority of stools are loose. Denies any other upper or lower GI symptoms.  Past Medical History:  Diagnosis Date  . Anemia   . Bipolar disorder ([place])   . BV (bacterial vaginosis) 08/15/2014  . CSF leak   . Depression   . GERD (gastroesophageal reflux disease)   . Headache(784.0)   . Low back pain 08/15/2014  . Obesity   . Thyroid disease   . Ulcer ([place])   . Urinary frequency 08/30/2014  . Vaginal discharge 08/15/2014  . Vaginal irritation 02/12/2014  . Yeast infection 02/12/2014    Past Surgical History:  Procedure Laterality Date  . CHOLECYSTECTOMY  Feb 2015   Dr. Ladona Horns, [place]  . ENDOSCOPIC CONCHA BULLOSA RESECTION Left 07/20/2016   Procedure: ENDOSCOPIC LEFT  CONCHA BULLOSA RESECTION;  Surgeon: Leta Baptist, MD;  Location: [place];  Service: ENT;  Laterality: Left;  . ESOPHAGOGASTRODUODENOSCOPY N/A 06/11/2014   [id number] DUODENITIS/MODERATE EROSIVE GASTRICTIS IN ANTRUM/PARTIALLY HEALED ULCERS  . ESOPHAGOGASTRODUODENOSCOPY N/A 05/06/2015   Procedure: ESOPHAGOGASTRODUODENOSCOPY (EGD);  Surgeon: Danie Binder, MD;  [facility];  Service: Endoscopy;  Laterality: N/A;  1300 - moved to 1:15 - office to notify  . ESOPHAGOGASTRODUODENOSCOPY (EGD) WITH ESOPHAGEAL DILATION N/A 02/11/2014   Dr. 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 EXCISION CHONCHA BULLOSA N/A 07/20/2016   Procedure: NASAL SEPTOPLASTY WITH BILATERAL  TURBINATE REDUCTION;  Surgeon: Leta Baptist, MD;  Location: [place];  Service: ENT;  Laterality: N/A;  . RHINOPLASTY    . SAVORY DILATION N/A 05/06/2015   Procedure: SAVORY DILATION;  Surgeon: Danie Binder, MD;  [facility];  Service: Endoscopy;  Laterality: N/A;  . TUBAL LIGATION       Current Outpatient Prescriptions  Medication Sig Dispense Refill  . butalbital-acetaminophen-caffeine (FIORICET, ESGIC) 50-325-40 MG tablet Take 1 tablet by mouth daily.     . fluticasone (FLONASE) 50 MCG/ACT nasal spray Place 1 spray into both nostrils daily as needed for allergies or rhinitis.    Marland Kitchen ondansetron (ZOFRAN ODT) 4 MG disintegrating tablet 4mg  ODT q4 hours prn nausea/vomit 12 tablet 0  . topiramate (TOPAMAX) 25 MG tablet Take 25 mg by mouth 2 (two) times daily.      No current facility-administered medications for this visit.     Allergies as of 12/22/2016 - Review Complete 12/22/2016  Allergen Reaction Noted  . Risperdal [risperidone] Other (See Comments) 10/11/2012  . Macrobid [nitrofurantoin monohyd macro] Itching 01/09/2014    Family History  Problem Relation Age of Onset  . Depression Father   . Anxiety disorder Father   . Depression Sister   . Anxiety disorder Sister   . Cancer Sister     cervical  . Depression Brother   . Anxiety disorder Brother   . Cancer - Other Paternal Grandfather     prostate  . Kidney disease Mother   . Diabetes Mother   . Cancer Maternal Grandmother     breast  . Cancer Paternal Grandmother     bladder  . ADD / ADHD Neg Hx   . Alcohol abuse Neg Hx   . Drug abuse Neg Hx   . Bipolar disorder Neg Hx   . Dementia Neg Hx   . OCD Neg Hx   . Paranoid behavior Neg Hx   . Schizophrenia Neg Hx   . Seizures Neg Hx   . Sexual abuse Neg Hx   . Physical abuse Neg Hx   . Colon cancer Neg Hx     Social History   Social History  . Marital status: Married    Spouse name: N/A  . Number of children: N/A  . Years of education: N/A   Social History Main Topics  . Smoking status: Never Smoker  . Smokeless tobacco: Never Used  . Alcohol use No  . Drug use: No  . Sexual activity: Yes    Birth control/ protection: Surgical     Comment: tubal   Other Topics Concern  . None   Social History Narrative  . None    Review of  Systems: Complete ROS negative except as per HPI.   Physical Exam: BP 128/83   Pulse 75   Temp 97.5 F (36.4 C) (Oral)   Ht 5' (1.524 m)   Wt 212 lb 3.2 oz (96.3 kg)   LMP 12/13/2016 (Approximate)   BMI 41.44 kg/m  General:   Obese female. Alert and oriented. Pleasant and cooperative. Well-nourished and well-developed.  Eyes:  Without icterus, sclera clear and conjunctiva pink.  Ears:  Normal auditory acuity. Cardiovascular:  S1, S2 present without murmurs appreciated. Extremities without clubbing or edema. Respiratory:  Clear to auscultation bilaterally. No wheezes, rales, or rhonchi. No distress.  Gastrointestinal:  +BS, rounded but soft, and non-distended. Mild epigastric TTP. No HSM noted. No guarding or rebound. No masses appreciated.  Rectal:  Deferred  Musculoskalatal:  Symmetrical without gross deformities. Neurologic:  Alert and oriented x4;  grossly normal neurologically. Psych:  Alert and cooperative. Normal mood and affect. Heme/Lymph/Immune: No excessive bruising noted.    12/22/2016 10:27 AM   Disclaimer: This note was dictated with voice recognition software. Similar sounding words can inadvertently be transcribed and may not be corrected upon review.

## 2016-12-22 NOTE — Progress Notes (Signed)
cc'ed to pcp °

## 2016-12-22 NOTE — Assessment & Plan Note (Signed)
Relatively recent onset of diarrhea, also noted viral illness. She could have developed viral gastroenteritis with the stomach bug that is been going around. At this point I'll have her collect stool samples and bring them to the lab. If they are negative, we can prescribe Bentyl for diarrhea to help for symptomatic management until her diarrhea either physical valve or we can proceed with more workup. Return for follow-up in 6 weeks.

## 2016-12-22 NOTE — Patient Instructions (Signed)
1. Stop taking all NSAID medications. This includes things such as ibuprofen, Motrin, Aleve, Advil, and anything with "NSAID" on the bottle. 2. You can take Tylenol. 3. Follow-up with your primary care provider for pain management issues. 4. When you collect stool samples bring them to the lab, not our office. 5. We will provide you with samples of Dexalone 60 mg. Take 1 pill, on an empty stomach, prior to your first meal the day. 6. Pulses the progress report on if the medicine is helping in 1-2 weeks. 7. Return for follow-up in 6 weeks.

## 2016-12-22 NOTE — Assessment & Plan Note (Signed)
The patient has a history of gastric ulcer. Continues to take ibuprofen every other day. Recommended she stop ibuprofen and see primary care for other pain management options. Return for follow-up in 6 weeks. Further GERD management as per below.

## 2016-12-22 NOTE — Assessment & Plan Note (Signed)
Chronic history of GERD, history of H. pylori status post eradication with Pylera, history of gastric ulcer as per below. She is not currently on a PPI, she was on Protonix but stopped taking because she didn't think it was working very well. Continues to take ibuprofen. Have advised her to avoid all NSAIDs. I will give her samples of Dexon 60 mg once a day for one to 2 weeks and request a progress report in 1-2 weeks. Return for follow-up in 6 weeks.

## 2016-12-24 DIAGNOSIS — E039 Hypothyroidism, unspecified: Secondary | ICD-10-CM | POA: Diagnosis not present

## 2016-12-24 DIAGNOSIS — R7301 Impaired fasting glucose: Secondary | ICD-10-CM | POA: Diagnosis not present

## 2016-12-24 DIAGNOSIS — D509 Iron deficiency anemia, unspecified: Secondary | ICD-10-CM | POA: Diagnosis not present

## 2016-12-29 DIAGNOSIS — G43009 Migraine without aura, not intractable, without status migrainosus: Secondary | ICD-10-CM | POA: Diagnosis not present

## 2016-12-29 DIAGNOSIS — F319 Bipolar disorder, unspecified: Secondary | ICD-10-CM | POA: Diagnosis not present

## 2016-12-29 DIAGNOSIS — R7301 Impaired fasting glucose: Secondary | ICD-10-CM | POA: Diagnosis not present

## 2016-12-29 DIAGNOSIS — Z0000 Encounter for general adult medical examination without abnormal findings: Secondary | ICD-10-CM | POA: Diagnosis not present

## 2016-12-29 DIAGNOSIS — K219 Gastro-esophageal reflux disease without esophagitis: Secondary | ICD-10-CM | POA: Diagnosis not present

## 2016-12-30 ENCOUNTER — Ambulatory Visit: Payer: Medicare Other | Admitting: Obstetrics and Gynecology

## 2017-01-06 DIAGNOSIS — R030 Elevated blood-pressure reading, without diagnosis of hypertension: Secondary | ICD-10-CM | POA: Diagnosis not present

## 2017-01-06 DIAGNOSIS — Z6841 Body Mass Index (BMI) 40.0 and over, adult: Secondary | ICD-10-CM | POA: Diagnosis not present

## 2017-01-06 DIAGNOSIS — G960 Cerebrospinal fluid leak: Secondary | ICD-10-CM | POA: Diagnosis not present

## 2017-01-07 DIAGNOSIS — H33302 Unspecified retinal break, left eye: Secondary | ICD-10-CM | POA: Diagnosis not present

## 2017-01-07 DIAGNOSIS — H35413 Lattice degeneration of retina, bilateral: Secondary | ICD-10-CM | POA: Diagnosis not present

## 2017-01-07 DIAGNOSIS — H52223 Regular astigmatism, bilateral: Secondary | ICD-10-CM | POA: Diagnosis not present

## 2017-01-07 DIAGNOSIS — H33301 Unspecified retinal break, right eye: Secondary | ICD-10-CM | POA: Diagnosis not present

## 2017-01-07 DIAGNOSIS — R51 Headache: Secondary | ICD-10-CM | POA: Diagnosis not present

## 2017-01-07 DIAGNOSIS — H5213 Myopia, bilateral: Secondary | ICD-10-CM | POA: Diagnosis not present

## 2017-01-10 ENCOUNTER — Other Ambulatory Visit: Payer: Self-pay | Admitting: Neurosurgery

## 2017-01-10 ENCOUNTER — Ambulatory Visit: Payer: Medicare Other | Admitting: Obstetrics and Gynecology

## 2017-01-10 DIAGNOSIS — G9600 Cerebrospinal fluid leak, unspecified: Secondary | ICD-10-CM

## 2017-01-25 DIAGNOSIS — F902 Attention-deficit hyperactivity disorder, combined type: Secondary | ICD-10-CM | POA: Diagnosis not present

## 2017-01-25 DIAGNOSIS — F419 Anxiety disorder, unspecified: Secondary | ICD-10-CM | POA: Diagnosis not present

## 2017-01-27 ENCOUNTER — Other Ambulatory Visit: Payer: Self-pay

## 2017-02-01 ENCOUNTER — Ambulatory Visit
Admission: RE | Admit: 2017-02-01 | Discharge: 2017-02-01 | Disposition: A | Discharge status: Home / Self Care | Payer: Medicare Other | Source: Ambulatory Visit | Attending: Neurosurgery | Admitting: Neurosurgery

## 2017-02-01 DIAGNOSIS — G9600 Cerebrospinal fluid leak, unspecified: Secondary | ICD-10-CM

## 2017-02-01 DIAGNOSIS — K219 Gastro-esophageal reflux disease without esophagitis: Secondary | ICD-10-CM | POA: Diagnosis not present

## 2017-02-01 DIAGNOSIS — Z8719 Personal history of other diseases of the digestive system: Secondary | ICD-10-CM | POA: Diagnosis not present

## 2017-02-01 DIAGNOSIS — R197 Diarrhea, unspecified: Secondary | ICD-10-CM | POA: Diagnosis not present

## 2017-02-01 MED ORDER — DIAZEPAM 5 MG PO TABS
10.0000 mg | ORAL_TABLET | Freq: Once | ORAL | Status: AC
  Administered 2017-02-01: 10 mg via ORAL

## 2017-02-01 MED ORDER — IOPAMIDOL (ISOVUE-M 300) INJECTION 61%
10.0000 mL | Freq: Once | INTRAMUSCULAR | Status: AC | PRN
  Administered 2017-02-01: 10 mL via INTRATHECAL

## 2017-02-01 MED ORDER — ONDANSETRON HCL 4 MG/2ML IJ SOLN: 4.0000 mg | Freq: Four times a day (QID) | INTRAMUSCULAR | Status: DC | PRN

## 2017-02-01 NOTE — Discharge Instructions (Signed)

## 2017-02-02 ENCOUNTER — Ambulatory Visit: Payer: Medicare Other | Admitting: Nurse Practitioner

## 2017-02-02 LAB — CLOSTRIDIUM DIFFICILE BY PCR: CDIFFPCR: NOT DETECTED

## 2017-02-03 DIAGNOSIS — G960 Cerebrospinal fluid leak: Secondary | ICD-10-CM | POA: Diagnosis not present

## 2017-02-03 LAB — GASTROINTESTINAL PATHOGEN PANEL PCR
C. DIFFICILE TOX A/B, PCR: NOT DETECTED
CRYPTOSPORIDIUM, PCR: NOT DETECTED
Campylobacter, PCR: NOT DETECTED
E COLI (STEC) STX1/STX2, PCR: NOT DETECTED
E coli (ETEC) LT/ST PCR: NOT DETECTED
E coli 0157, PCR: NOT DETECTED
GIARDIA LAMBLIA, PCR: NOT DETECTED
Norovirus, PCR: NOT DETECTED
ROTAVIRUS, PCR: NOT DETECTED
Salmonella, PCR: NOT DETECTED
Shigella, PCR: NOT DETECTED

## 2017-02-07 ENCOUNTER — Telehealth: Payer: Self-pay | Admitting: Radiology

## 2017-02-07 DIAGNOSIS — R079 Chest pain, unspecified: Secondary | ICD-10-CM | POA: Diagnosis not present

## 2017-02-07 DIAGNOSIS — R0602 Shortness of breath: Secondary | ICD-10-CM | POA: Diagnosis not present

## 2017-02-07 DIAGNOSIS — M791 Myalgia: Secondary | ICD-10-CM | POA: Diagnosis not present

## 2017-02-07 DIAGNOSIS — R0789 Other chest pain: Secondary | ICD-10-CM | POA: Diagnosis not present

## 2017-02-07 DIAGNOSIS — R0600 Dyspnea, unspecified: Secondary | ICD-10-CM | POA: Diagnosis not present

## 2017-02-07 NOTE — Telephone Encounter (Signed)
Pt states she is having leg swelling for the last couple of days. Explained this is not from having the myelogram and she should see her primary care physician.

## 2017-02-07 NOTE — Progress Notes (Signed)
Left Vm that C-diff neg, waiting on others and will call.

## 2017-02-08 DIAGNOSIS — R0600 Dyspnea, unspecified: Secondary | ICD-10-CM | POA: Diagnosis not present

## 2017-02-09 NOTE — Progress Notes (Signed)
Pt is aware of results and said she is not having diarrhea now. She just feels like her food does not sit well on her stomach when she eats.

## 2017-02-09 NOTE — Progress Notes (Signed)
LMOM to call.

## 2017-02-10 DIAGNOSIS — G5702 Lesion of sciatic nerve, left lower limb: Secondary | ICD-10-CM | POA: Diagnosis not present

## 2017-02-10 DIAGNOSIS — M545 Low back pain: Secondary | ICD-10-CM | POA: Diagnosis not present

## 2017-02-10 DIAGNOSIS — G5701 Lesion of sciatic nerve, right lower limb: Secondary | ICD-10-CM | POA: Diagnosis not present

## 2017-02-23 ENCOUNTER — Ambulatory Visit: Payer: Medicare Other | Admitting: Nurse Practitioner

## 2017-02-23 NOTE — Progress Notes (Deleted)
Referring Provider: Celene Squibb, MD Primary Care Physician:  Wende Neighbors, MD Primary GI:  Dr. Oneida Alar  No chief complaint on file.   HPI:   Teresa Tran is a 36 y.o. female who presents for GERD and diarrhea. The patient was last seen in our office 12/22/2016 for GERD, diarrhea, history of gastric ulcer. At that time she noted she was doing okay, history of H. pylori and peptic ulcer disease. Chronic stress. Coughing associated with dizziness due to CSF leak. Daily reflux symptoms, not on any PPI at that time and previously on Protonix which didn't help. Has also taken Prilosec previously. Ibuprofen as needed. Noted diarrhea. Recommended NSAID avoidance, follow-up with primary care for pain management issues, stool studies, Dexilant daily samples were given recommended pressure for 1-2 weeks. Return for follow-up in 6 weeks. No two-week progress report was called in to our office. C. difficile and GI pathogen panel were both negative.  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 H. pylori negative on biopsies., Prilosec daily.  Today she states  Past Medical History:  Diagnosis Date  . Anemia   . Bipolar disorder ([place])   . BV (bacterial vaginosis) 08/15/2014  . CSF leak   . Depression   . GERD (gastroesophageal reflux disease)   . Headache(784.0)   . Low back pain 08/15/2014  . Obesity   . Thyroid disease   . Ulcer ([place])   . Urinary frequency 08/30/2014  . Vaginal discharge 08/15/2014  . Vaginal irritation 02/12/2014  . Yeast infection 02/12/2014    Past Surgical History:  Procedure Laterality Date  . CHOLECYSTECTOMY  Feb 2015   Dr. Ladona Horns, [place]  . ENDOSCOPIC CONCHA BULLOSA RESECTION Left 07/20/2016   Procedure: ENDOSCOPIC LEFT  CONCHA BULLOSA RESECTION;  Surgeon: Leta Baptist, MD;  Location: [place];  Service: ENT;  Laterality: Left;  . ESOPHAGOGASTRODUODENOSCOPY N/A 06/11/2014   [id number] DUODENITIS/MODERATE  EROSIVE GASTRICTIS IN ANTRUM/PARTIALLY HEALED ULCERS  . ESOPHAGOGASTRODUODENOSCOPY N/A 05/06/2015   Procedure: ESOPHAGOGASTRODUODENOSCOPY (EGD);  Surgeon: Danie Binder, MD;  [facility];  Service: Endoscopy;  Laterality: N/A;  1300 - moved to 1:15 - office to notify  . ESOPHAGOGASTRODUODENOSCOPY (EGD) WITH ESOPHAGEAL DILATION N/A 02/11/2014   Dr. 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 EXCISION CHONCHA BULLOSA N/A 07/20/2016   Procedure: NASAL SEPTOPLASTY WITH BILATERAL TURBINATE REDUCTION;  Surgeon: Leta Baptist, MD;  Location: [facility];  Service: ENT;  Laterality: N/A;  . RHINOPLASTY    . SAVORY DILATION N/A 05/06/2015   Procedure: SAVORY DILATION;  Surgeon: Danie Binder, MD;  [facility];  Service: Endoscopy;  Laterality: N/A;  . TUBAL LIGATION      Current Outpatient Prescriptions  Medication Sig Dispense Refill  . butalbital-acetaminophen-caffeine (FIORICET, ESGIC) 50-325-40 MG tablet Take 1 tablet by mouth daily.     . fluticasone (FLONASE) 50 MCG/ACT nasal spray Place 1 spray into both nostrils daily as needed for allergies or rhinitis.    Marland Kitchen ondansetron (ZOFRAN ODT) 4 MG disintegrating tablet 4mg  ODT q4 hours prn nausea/vomit 12 tablet 0  . topiramate (TOPAMAX) 25 MG tablet Take 25 mg by mouth 2 (two) times daily.      No current facility-administered medications for this visit.     Allergies as of 02/23/2017 - Review Complete 02/01/2017  Allergen Reaction Noted  . Macrobid [nitrofurantoin monohyd macro] Itching 01/09/2014  . Risperdal [risperidone] Other (See Comments)  10/11/2012    Family History  Problem Relation Age of Onset  . Depression Father   . Anxiety disorder Father   . Depression Sister   . Anxiety disorder Sister   . Cancer Sister     cervical  . Depression Brother   . Anxiety disorder Brother   . Cancer - Other Paternal Grandfather     prostate  . Kidney disease Mother   .  Diabetes Mother   . Cancer Maternal Grandmother     breast  . Cancer Paternal Grandmother     bladder  . ADD / ADHD Neg Hx   . Alcohol abuse Neg Hx   . Drug abuse Neg Hx   . Bipolar disorder Neg Hx   . Dementia Neg Hx   . OCD Neg Hx   . Paranoid behavior Neg Hx   . Schizophrenia Neg Hx   . Seizures Neg Hx   . Sexual abuse Neg Hx   . Physical abuse Neg Hx   . Colon cancer Neg Hx     Social History   Social History  . Marital status: Married    Spouse name: N/A  . Number of children: N/A  . Years of education: N/A   Social History Main Topics  . Smoking status: Never Smoker  . Smokeless tobacco: Never Used  . Alcohol use No  . Drug use: No  . Sexual activity: Yes    Birth control/ protection: Surgical     Comment: tubal   Other Topics Concern  . Not on file   Social History Narrative  . No narrative on file    Review of Systems: General: Negative for anorexia, weight loss, fever, chills, fatigue, weakness. Eyes: Negative for vision changes.  ENT: Negative for hoarseness, difficulty swallowing , nasal congestion. CV: Negative for chest pain, angina, palpitations, dyspnea on exertion, peripheral edema.  Respiratory: Negative for dyspnea at rest, dyspnea on exertion, cough, sputum, wheezing.  GI: See history of present illness. GU:  Negative for dysuria, hematuria, urinary incontinence, urinary frequency, nocturnal urination.  MS: Negative for joint pain, low back pain.  Derm: Negative for rash or itching.  Neuro: Negative for weakness, abnormal sensation, seizure, frequent headaches, memory loss, confusion.  Psych: Negative for anxiety, depression, suicidal ideation, hallucinations.  Endo: Negative for unusual weight change.  Heme: Negative for bruising or bleeding. Allergy: Negative for rash or hives.   Physical Exam: There were no vitals taken for this visit. General:   Alert and oriented. Pleasant and cooperative. Well-nourished and well-developed.  Head:   Normocephalic and atraumatic. Eyes:  Without icterus, sclera clear and conjunctiva pink.  Ears:  Normal auditory acuity. Mouth:  No deformity or lesions, oral mucosa pink.  Throat/Neck:  Supple, without mass or thyromegaly. Cardiovascular:  S1, S2 present without murmurs appreciated. Normal pulses noted. Extremities without clubbing or edema. Respiratory:  Clear to auscultation bilaterally. No wheezes, rales, or rhonchi. No distress.  Gastrointestinal:  +BS, soft, non-tender and non-distended. No HSM noted. No guarding or rebound. No masses appreciated.  Rectal:  Deferred  Musculoskalatal:  Symmetrical without gross deformities. Normal posture. Skin:  Intact without significant lesions or rashes. Neurologic:  Alert and oriented x4;  grossly normal neurologically. Psych:  Alert and cooperative. Normal mood and affect. Heme/Lymph/Immune: No significant cervical adenopathy. No excessive bruising noted.    02/23/2017 8:08 AM   Disclaimer: This note was dictated with voice recognition software. Similar sounding words can inadvertently be transcribed and may not be corrected upon  review.

## 2017-02-28 ENCOUNTER — Encounter: Payer: Self-pay | Admitting: Nurse Practitioner

## 2017-02-28 ENCOUNTER — Ambulatory Visit: Payer: Medicare Other | Admitting: Nurse Practitioner

## 2017-02-28 ENCOUNTER — Telehealth: Payer: Self-pay | Admitting: Nurse Practitioner

## 2017-02-28 NOTE — Progress Notes (Deleted)
Referring Provider: Celene Squibb, MD Primary Care Physician:  Wende Neighbors, MD Primary GI:  Dr. Oneida Alar  No chief complaint on file.   HPI:   Teresa Tran is a 36 y.o. female who presents for follow-up on GERD and diarrhea. She was last seen in our office 12/22/2016 for the same. Noted history of peptic ulcer disease and H. pylori gastritis status post treatment with Pylera and 2016. 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 At the time of her last visit she noted reflux did not improve after endoscopy and had been under a lot of stress with other medical problems. Significant coughing, reflux symptoms daily, not on a PPI at that time. Stop taking Protonix on her and she did not feel like it worked. Ibuprofen as needed every other day. Also noted a lot of diarrhea. Recommended NSAID avoidance, stool studies ordered, given samples of Dexilant 60 mg daily and requested progress report in 1-2 weeks.  Stool studies negative. At the time that we notified her she was not having diarrhea any more but "food not sitting on her stomach when she eats." Recommended continue Dexon.  Today she states   Past Medical History:  Diagnosis Date  . Anemia   . Bipolar disorder ([place])   . BV (bacterial vaginosis) 08/15/2014  . CSF leak   . Depression   . GERD (gastroesophageal reflux disease)   . Headache(784.0)   . Low back pain 08/15/2014  . Obesity   . Thyroid disease   . Ulcer ([place])   . Urinary frequency 08/30/2014  . Vaginal discharge 08/15/2014  . Vaginal irritation 02/12/2014  . Yeast infection 02/12/2014    Past Surgical History:  Procedure Laterality Date  . CHOLECYSTECTOMY  Feb 2015   Dr. Ladona Horns, [place]  . ENDOSCOPIC CONCHA BULLOSA RESECTION Left 07/20/2016   Procedure: ENDOSCOPIC LEFT  CONCHA BULLOSA RESECTION;  Surgeon: Leta Baptist, MD;  Location: [facility];  Service: ENT;  Laterality: Left;  . ESOPHAGOGASTRODUODENOSCOPY N/A 06/11/2014   [id number]  DUODENITIS/MODERATE EROSIVE GASTRICTIS IN ANTRUM/PARTIALLY HEALED ULCERS  . ESOPHAGOGASTRODUODENOSCOPY N/A 05/06/2015   Procedure: ESOPHAGOGASTRODUODENOSCOPY (EGD);  Surgeon: Danie Binder, MD;  [facility];  Service: Endoscopy;  Laterality: N/A;  1300 - moved to 1:15 - office to notify  . ESOPHAGOGASTRODUODENOSCOPY (EGD) WITH ESOPHAGEAL DILATION N/A 02/11/2014   Dr. 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 EXCISION CHONCHA BULLOSA N/A 07/20/2016   Procedure: NASAL SEPTOPLASTY WITH BILATERAL TURBINATE REDUCTION;  Surgeon: Leta Baptist, MD;  Location: [facility];  Service: ENT;  Laterality: N/A;  . RHINOPLASTY    . SAVORY DILATION N/A 05/06/2015   Procedure: SAVORY DILATION;  Surgeon: Danie Binder, MD;  [facility];  Service: Endoscopy;  Laterality: N/A;  . TUBAL LIGATION      Current Outpatient Prescriptions  Medication Sig Dispense Refill  . butalbital-acetaminophen-caffeine (FIORICET, ESGIC) 50-325-40 MG tablet Take 1 tablet by mouth daily.     . fluticasone (FLONASE) 50 MCG/ACT nasal spray Place 1 spray into both nostrils daily as needed for allergies or rhinitis.    Marland Kitchen ondansetron (ZOFRAN ODT) 4 MG disintegrating tablet 4mg  ODT q4 hours prn nausea/vomit 12 tablet 0  . topiramate (TOPAMAX) 25 MG tablet Take 25 mg by mouth 2 (two) times daily.      No current facility-administered medications for this visit.     Allergies as of 02/28/2017 - Review Complete 02/01/2017  Allergen  Reaction Noted  . Macrobid [nitrofurantoin monohyd macro] Itching 01/09/2014  . Risperdal [risperidone] Other (See Comments) 10/11/2012    Family History  Problem Relation Age of Onset  . Depression Father   . Anxiety disorder Father   . Depression Sister   . Anxiety disorder Sister   . Cancer Sister     cervical  . Depression Brother   . Anxiety disorder Brother   . Cancer - Other Paternal Grandfather     prostate  . Kidney  disease Mother   . Diabetes Mother   . Cancer Maternal Grandmother     breast  . Cancer Paternal Grandmother     bladder  . ADD / ADHD Neg Hx   . Alcohol abuse Neg Hx   . Drug abuse Neg Hx   . Bipolar disorder Neg Hx   . Dementia Neg Hx   . OCD Neg Hx   . Paranoid behavior Neg Hx   . Schizophrenia Neg Hx   . Seizures Neg Hx   . Sexual abuse Neg Hx   . Physical abuse Neg Hx   . Colon cancer Neg Hx     Social History   Social History  . Marital status: Married    Spouse name: N/A  . Number of children: N/A  . Years of education: N/A   Social History Main Topics  . Smoking status: Never Smoker  . Smokeless tobacco: Never Used  . Alcohol use No  . Drug use: No  . Sexual activity: Yes    Birth control/ protection: Surgical     Comment: tubal   Other Topics Concern  . Not on file   Social History Narrative  . No narrative on file    Review of Systems: General: Negative for anorexia, weight loss, fever, chills, fatigue, weakness. Eyes: Negative for vision changes.  ENT: Negative for hoarseness, difficulty swallowing , nasal congestion. CV: Negative for chest pain, angina, palpitations, dyspnea on exertion, peripheral edema.  Respiratory: Negative for dyspnea at rest, dyspnea on exertion, cough, sputum, wheezing.  GI: See history of present illness. GU:  Negative for dysuria, hematuria, urinary incontinence, urinary frequency, nocturnal urination.  MS: Negative for joint pain, low back pain.  Derm: Negative for rash or itching.  Neuro: Negative for weakness, abnormal sensation, seizure, frequent headaches, memory loss, confusion.  Psych: Negative for anxiety, depression, suicidal ideation, hallucinations.  Endo: Negative for unusual weight change.  Heme: Negative for bruising or bleeding. Allergy: Negative for rash or hives.   Physical Exam: There were no vitals taken for this visit. General:   Alert and oriented. Pleasant and cooperative. Well-nourished and  well-developed.  Head:  Normocephalic and atraumatic. Eyes:  Without icterus, sclera clear and conjunctiva pink.  Ears:  Normal auditory acuity. Mouth:  No deformity or lesions, oral mucosa pink.  Throat/Neck:  Supple, without mass or thyromegaly. Cardiovascular:  S1, S2 present without murmurs appreciated. Normal pulses noted. Extremities without clubbing or edema. Respiratory:  Clear to auscultation bilaterally. No wheezes, rales, or rhonchi. No distress.  Gastrointestinal:  +BS, soft, non-tender and non-distended. No HSM noted. No guarding or rebound. No masses appreciated.  Rectal:  Deferred  Musculoskalatal:  Symmetrical without gross deformities. Normal posture. Skin:  Intact without significant lesions or rashes. Neurologic:  Alert and oriented x4;  grossly normal neurologically. Psych:  Alert and cooperative. Normal mood and affect. Heme/Lymph/Immune: No significant cervical adenopathy. No excessive bruising noted.    02/28/2017 8:09 AM   Disclaimer: This note was dictated  with voice recognition software. Similar sounding words can inadvertently be transcribed and may not be corrected upon review.

## 2017-02-28 NOTE — Telephone Encounter (Signed)
Noted  

## 2017-02-28 NOTE — Telephone Encounter (Signed)
Pt was a no show and letter sent  °

## 2017-03-02 DIAGNOSIS — F419 Anxiety disorder, unspecified: Secondary | ICD-10-CM | POA: Diagnosis not present

## 2017-03-31 NOTE — Progress Notes (Signed)
REVIEWED-NO ADDITIONAL RECOMMENDATIONS. 

## 2017-04-04 IMAGING — CT CT HEAD W/O CM
5 of 7 series · 18 of 47 positions shown, 19 images · non-contrast
Comparison: Brain MRI 06/02/2016

CLINICAL DATA: Headache and pressure for 2 months. Posterior neck
pain and soreness. No known injury.

EXAM:
CT HEAD WITHOUT CONTRAST
CT CERVICAL SPINE WITHOUT CONTRAST
TECHNIQUE: Multidetector CT imaging of the head and cervical spine was
performed following the standard protocol without intravenous
contrast. Multiplanar CT image reconstructions of the cervical spine
were also generated.

[Series 3: head wo · axial · 0.41mm/px · z∈[+118,+193]mm · 3 of 31 slices shown, 4 images]
[im 8/31  brain]
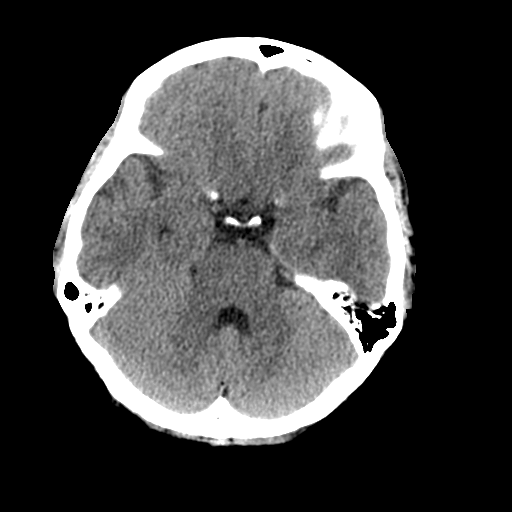
[im 8/31  bone]
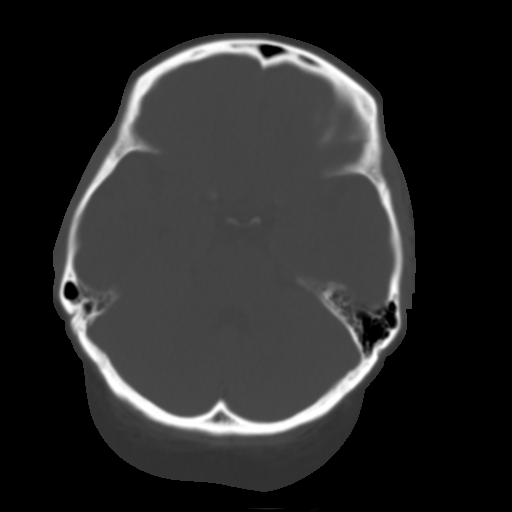
[im 16/31  brain]
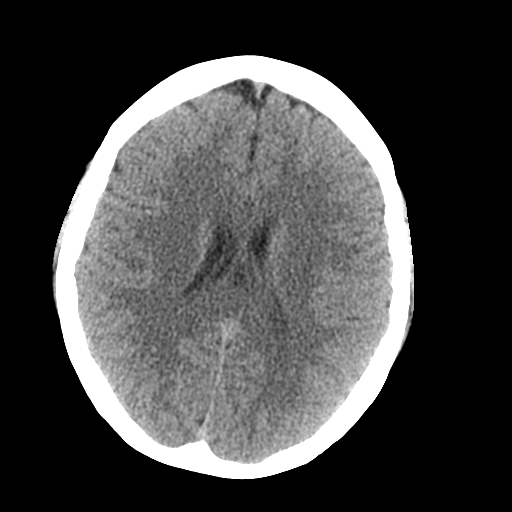
[im 23/31  brain]
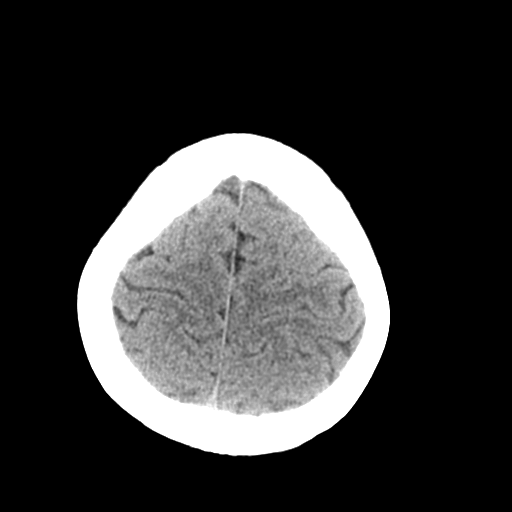

[Series 5: coronal soft tissue · coronal · 0.30mm/px · 3 of 63 slices shown]
[im 24/63  brain]
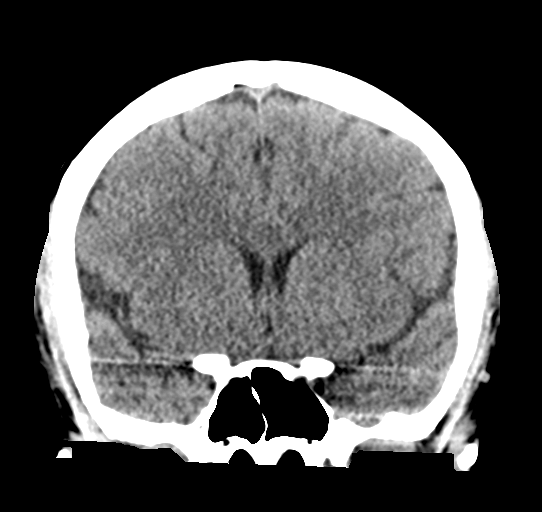
[im 32/63  brain]
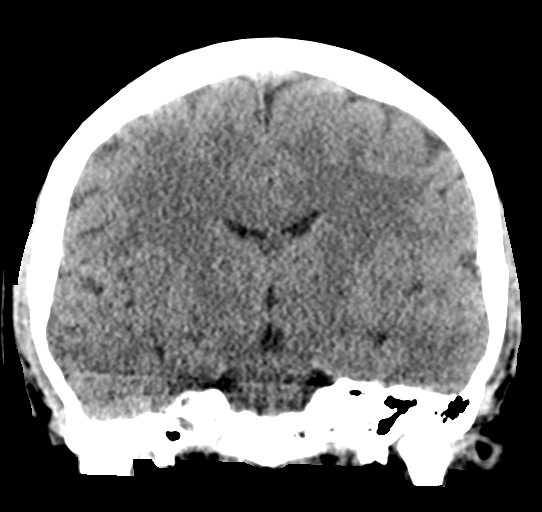
[im 39/63  brain]
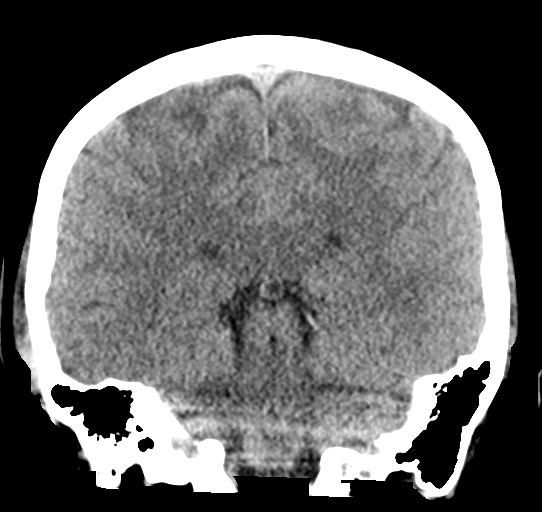

[Series 6: sagittal soft tissue · sagittal · 0.29mm/px · 1 of 56 slices shown]
[im 28/56  brain]
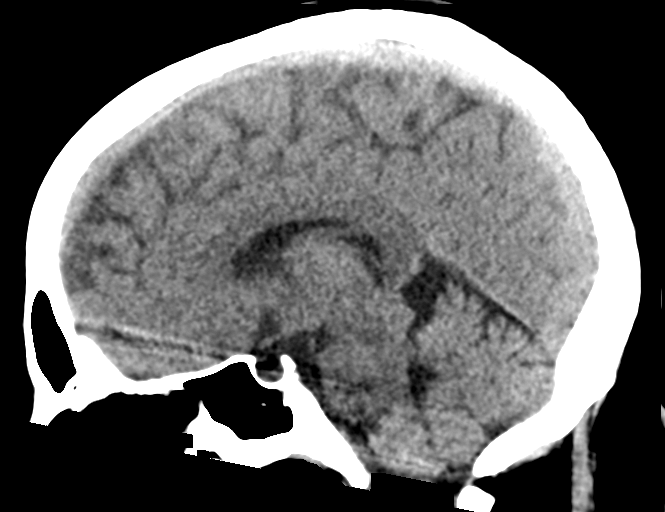

[Series 8: c spine soft · axial · 0.32mm/px · z∈[-65,-23]mm · 3 of 77 slices shown]
[im 7/77  brain]
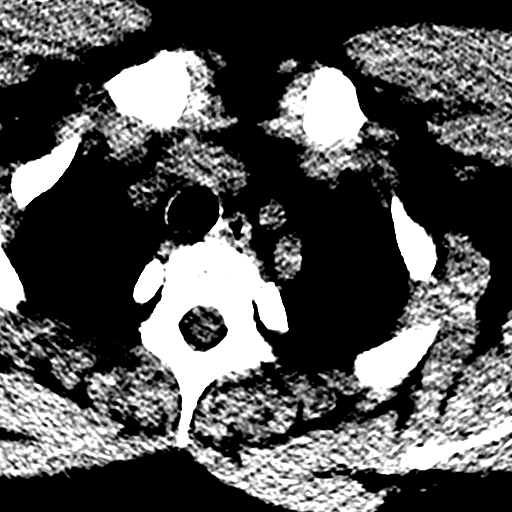
[im 14/77  brain]
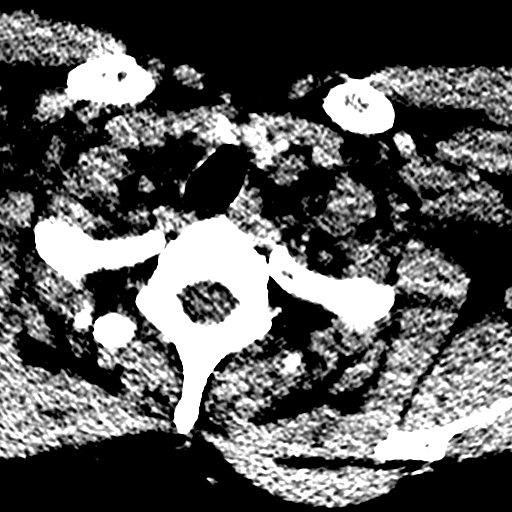
[im 28/77  brain]
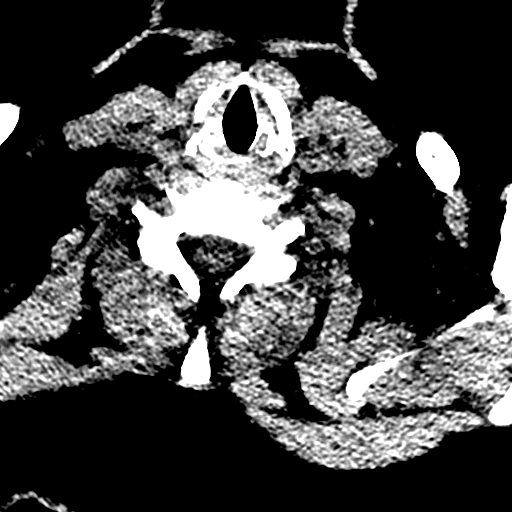

[Series 13: orthogonal bone · axial · 0.21mm/px · z∈[-75,+46]mm · 8 of 84 slices shown]
[im 7/84  bone]
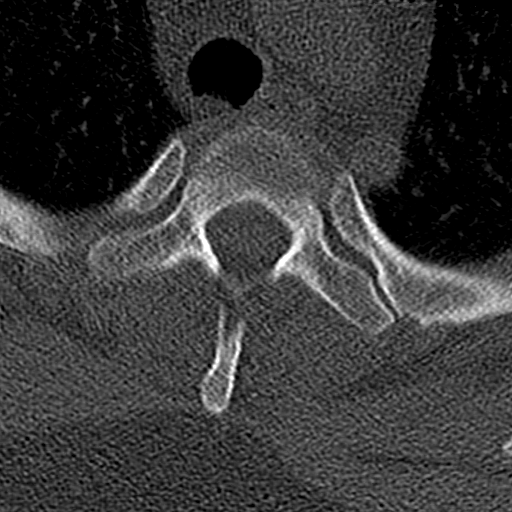
[im 21/84  bone]
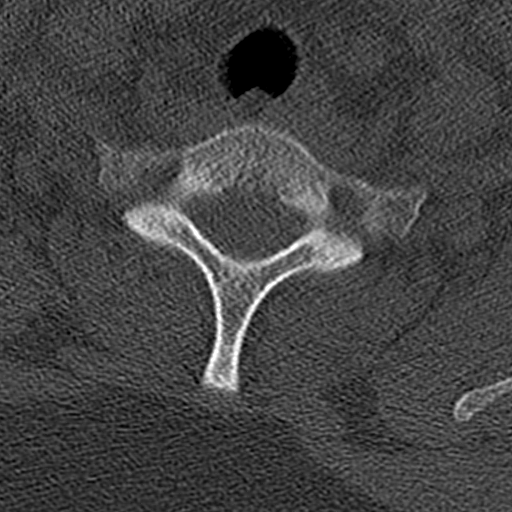
[im 28/84  bone]
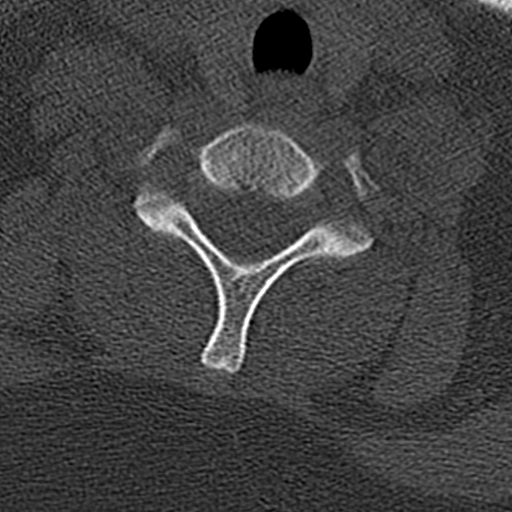
[im 35/84  bone]
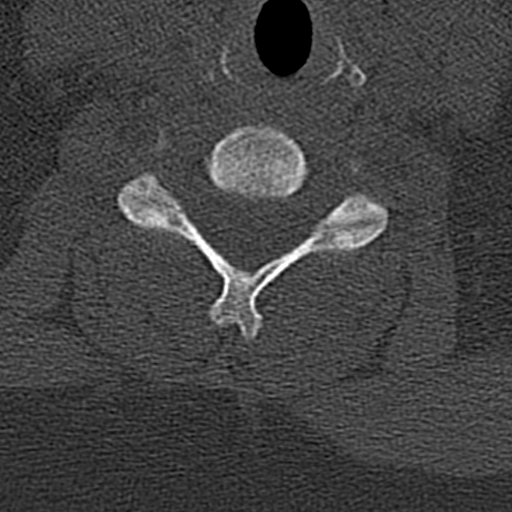
[im 49/84  bone]
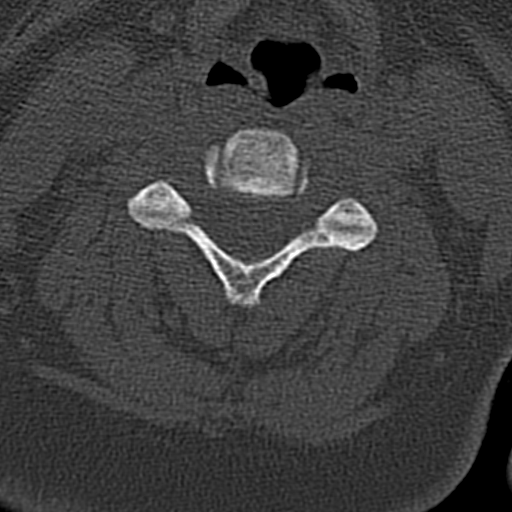
[im 56/84  bone]
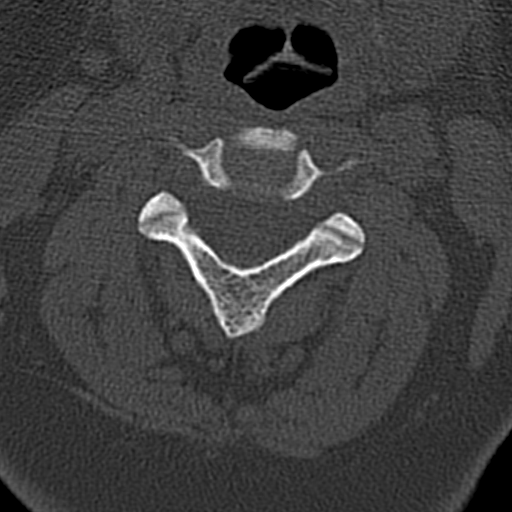
[im 63/84  bone]
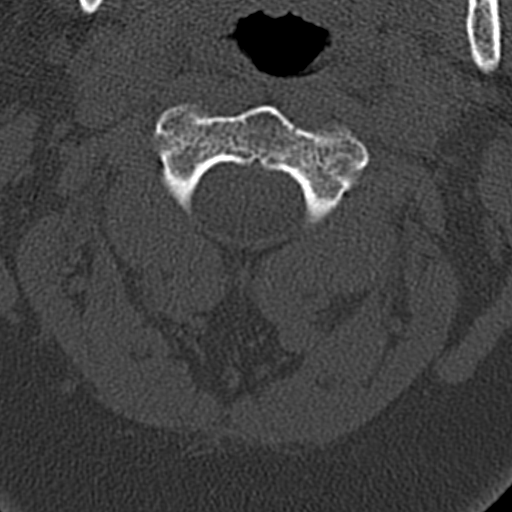
[im 77/84  bone]
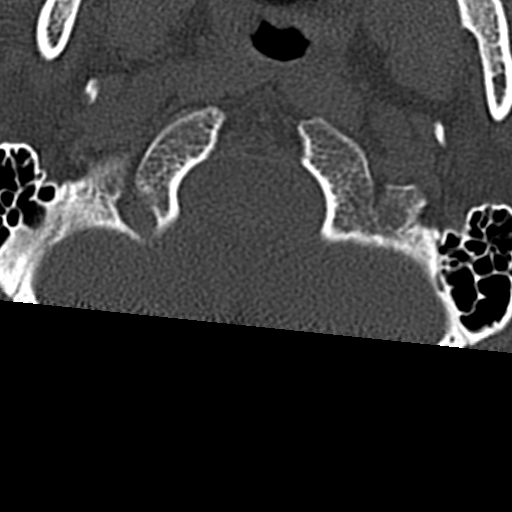

[18 of 47 positions shown; findings below may reference images not displayed]

FINDINGS: CT HEAD FINDINGS

Brain: Appears normal without hemorrhage, infarct, mass lesion, mass
effect, midline shift or abnormal extra-axial fluid collection. No
hydrocephalus or pneumocephalus.

Vascular: Negative.

Skull: Intact.  No focal lesion.

Sinuses/Orbits: Negative.

Other: None.

CT CERVICAL SPINE FINDINGS

Alignment: Normal.

Skull base and vertebrae: No acute fracture. No primary bone lesion
or focal pathologic process.

Soft tissues and spinal canal: No prevertebral fluid or swelling. No
visible canal hematoma.

Disc levels:  Negative.

Upper chest: Lung apices clear.

Other: None.
IMPRESSION: Negative head and cervical spine CT scans.

## 2017-04-08 DIAGNOSIS — F419 Anxiety disorder, unspecified: Secondary | ICD-10-CM | POA: Diagnosis not present

## 2017-04-27 DIAGNOSIS — G43701 Chronic migraine without aura, not intractable, with status migrainosus: Secondary | ICD-10-CM | POA: Diagnosis not present

## 2017-04-27 DIAGNOSIS — E559 Vitamin D deficiency, unspecified: Secondary | ICD-10-CM | POA: Diagnosis not present

## 2017-04-27 DIAGNOSIS — M818 Other osteoporosis without current pathological fracture: Secondary | ICD-10-CM | POA: Diagnosis not present

## 2017-04-27 DIAGNOSIS — G4452 New daily persistent headache (NDPH): Secondary | ICD-10-CM | POA: Diagnosis not present

## 2017-04-27 DIAGNOSIS — R5383 Other fatigue: Secondary | ICD-10-CM | POA: Diagnosis not present

## 2017-04-27 DIAGNOSIS — M542 Cervicalgia: Secondary | ICD-10-CM | POA: Diagnosis not present

## 2017-04-27 DIAGNOSIS — G960 Cerebrospinal fluid leak: Secondary | ICD-10-CM | POA: Diagnosis not present

## 2017-04-27 DIAGNOSIS — A6923 Arthritis due to Lyme disease: Secondary | ICD-10-CM | POA: Diagnosis not present

## 2017-04-27 DIAGNOSIS — E538 Deficiency of other specified B group vitamins: Secondary | ICD-10-CM | POA: Diagnosis not present

## 2017-04-27 DIAGNOSIS — Z79899 Other long term (current) drug therapy: Secondary | ICD-10-CM | POA: Diagnosis not present

## 2017-04-27 DIAGNOSIS — M545 Low back pain: Secondary | ICD-10-CM | POA: Diagnosis not present

## 2017-04-27 DIAGNOSIS — M810 Age-related osteoporosis without current pathological fracture: Secondary | ICD-10-CM | POA: Diagnosis not present

## 2017-04-27 DIAGNOSIS — G4701 Insomnia due to medical condition: Secondary | ICD-10-CM | POA: Diagnosis not present

## 2017-05-10 ENCOUNTER — Ambulatory Visit: Payer: Medicare Other | Admitting: Gastroenterology

## 2017-05-17 DIAGNOSIS — M545 Low back pain: Secondary | ICD-10-CM | POA: Diagnosis not present

## 2017-05-18 DIAGNOSIS — F419 Anxiety disorder, unspecified: Secondary | ICD-10-CM | POA: Diagnosis not present

## 2017-06-21 ENCOUNTER — Encounter: Payer: Self-pay | Admitting: Family Medicine

## 2017-06-21 ENCOUNTER — Ambulatory Visit (INDEPENDENT_AMBULATORY_CARE_PROVIDER_SITE_OTHER): Payer: Medicare Other | Admitting: Family Medicine

## 2017-06-21 VITALS — BP 124/80 | HR 88 | Temp 98.6°F | Resp 18 | Ht 60.0 in | Wt 218.1 lb

## 2017-06-21 DIAGNOSIS — G43709 Chronic migraine without aura, not intractable, without status migrainosus: Secondary | ICD-10-CM

## 2017-06-21 DIAGNOSIS — F319 Bipolar disorder, unspecified: Secondary | ICD-10-CM | POA: Diagnosis not present

## 2017-06-21 DIAGNOSIS — J387 Other diseases of larynx: Secondary | ICD-10-CM | POA: Diagnosis not present

## 2017-06-21 DIAGNOSIS — R5382 Chronic fatigue, unspecified: Secondary | ICD-10-CM | POA: Diagnosis not present

## 2017-06-21 DIAGNOSIS — E669 Obesity, unspecified: Secondary | ICD-10-CM | POA: Insufficient documentation

## 2017-06-21 DIAGNOSIS — G43909 Migraine, unspecified, not intractable, without status migrainosus: Secondary | ICD-10-CM | POA: Insufficient documentation

## 2017-06-21 NOTE — Progress Notes (Signed)
Chief Complaint  Patient presents with  . Gastroesophageal Reflux  new patient Feels something is wrong with her that no body can find Offended that her prior PCP suggested it was psychiatric She has seen orthopedics, rheumatology, ER, PCP and neurology , still has ill defined body aches, neck pain and stiffness, joint pain and fatigue. It started a year ago when bitten by 3 ticks and diagnosed with RMSF.  Treated x 2 with doxycycline. Lyme testing negative. She has an extensive psychiatry history with 3 hospitalizations, multiple psychiatry providers and a large number of medicines tried.  She is currently on Cymbalta and ritalin per [facility] Headings.   Has GERD and is seen by GI.  Multiple EGD tests. Flonase for allergies Has migraine headache    Patient Active Problem List   Diagnosis Date Noted  . Migraine 06/21/2017  . Chronic fatigue 06/21/2017  . GERD (gastroesophageal reflux disease) 12/22/2016  . Uterine leiomyoma 12/02/2016  . Menorrhagia with irregular cycle 12/02/2016  . Cough 12/29/2015  . Irritable larynx syndrome 12/29/2015  . Normocytic anemia 09/11/2014  . Low back pain 08/15/2014  . PUD (peptic ulcer disease) 06/06/2014  . Unspecified vitamin D deficiency 02/19/2013  . Bipolar 1 disorder ([place]) 03/23/2012  . PTSD (post-traumatic stress disorder) 03/23/2012    Outpatient Encounter Prescriptions as of 06/21/2017  Medication Sig  . DULoxetine (CYMBALTA) 30 MG capsule Take 30 mg by mouth 2 (two) times daily. 2 in am and one at night  . fluticasone (FLONASE) 50 MCG/ACT nasal spray Place 1 spray into both nostrils daily as needed for allergies or rhinitis.  . methylphenidate (RITALIN) 5 MG tablet   . ondansetron (ZOFRAN ODT) 4 MG disintegrating tablet 4mg  ODT q4 hours prn nausea/vomit  . SUMAtriptan (IMITREX) 6 MG/0.5ML SOLN injection Inject 6 mg into the skin every 2 (two) hours as needed for migraine or headache. May repeat in 2 hours if headache persists or  recurs.  . Vitamin D, Ergocalciferol, (DRISDOL) 50000 units CAPS capsule Take 50,000 Units by mouth every 7 (seven) days. For 12 weeks   No facility-administered encounter medications on file as of 06/21/2017.     Allergies  Allergen Reactions  . Macrobid [Nitrofurantoin Monohyd Macro] Itching  . Risperdal [Risperidone] Other (See Comments)    Wt gain    Review of Systems  Constitutional: Positive for fatigue. Negative for chills and fever.  HENT: Negative for congestion and dental problem.   Eyes: Negative for photophobia and visual disturbance.  Respiratory: Negative for cough and shortness of breath.   Cardiovascular: Negative for chest pain and palpitations.  Gastrointestinal: Positive for abdominal pain and nausea. Negative for constipation and diarrhea.  Genitourinary: Positive for menstrual problem. Negative for difficulty urinating.  Musculoskeletal: Positive for arthralgias, back pain, myalgias, neck pain and neck stiffness.  Neurological: Positive for headaches. Negative for weakness and numbness.  Psychiatric/Behavioral: Negative for dysphoric mood. The patient is not nervous/anxious.        Denies    BP 124/80 (BP Location: Right Arm, Patient Position: Sitting, Cuff Size: Normal)   Pulse 88   Temp 98.6 F (37 C) (Temporal)   Resp 18   Ht 5' (1.524 m)   Wt 218 lb 1.9 oz (98.9 kg)   LMP 05/25/2017 (Approximate)   SpO2 100%   BMI 42.60 kg/m   Physical Exam  Constitutional: She is oriented to person, place, and time. She appears well-developed and well-nourished.  Obese  HENT:  Head: Normocephalic and atraumatic.  Neck:  Normal range of motion. Neck supple.  Cardiovascular: Normal rate, regular rhythm and normal heart sounds.   Pulmonary/Chest: Effort normal and breath sounds normal.  Musculoskeletal:  Moves comfortably. No apparent abnormality of extremities or pain behaviors  Neurological: She is alert and oriented to person, place, and time.  Psychiatric:  She expresses inappropriate judgment.  Blunted, slightly bland affect. Mild argumentative behavior when discussing psychiatric history    ASSESSMENT/PLAN:  1. Chronic migraine without aura without status migrainosus, not intractable Under care of Dr. Merlene Laughter  2. Chronic fatigue Documented in medical notes for years.  3. Irritable larynx syndrome Seen by ENT. "Neurogenic coughing.' Currently asymptomatic. 4. Morbid obesity  Patient requests diet pills. Declined. Diet and exercise discussed. Nutrition consult offered, declined. 5. Bipolar illness Under care of a psychiatric provider. Currently only treated with Cymbalta and Ritalin. Multiple medication used in the past. Multiple hospitalizations. Question of personality disorder in psychiatric records.  Greater than 50% of this visit was spent in counseling and coordinating care.  Total face to face time:   45 minutes. Explained to the patient that her medical history to me did not sound like a medical problem was causing her fatigue and multiple body pains. Although not typical fibromyalgia this is a possibility. Suspect somatic complaints related to psychiatric illness, possible somatization disorder.  Patient Instructions  Need records Dr [name] Dr Merlene Laughter  Rhe[facility]   See me in 4 weeks  Raylene Everts, MD

## 2017-06-21 NOTE — Patient Instructions (Signed)
Need records Dr [name] Dr Merlene Laughter  Rheumatology [facility]

## 2017-06-27 DIAGNOSIS — F419 Anxiety disorder, unspecified: Secondary | ICD-10-CM | POA: Diagnosis not present

## 2017-07-05 ENCOUNTER — Encounter: Payer: Self-pay | Admitting: *Deleted

## 2017-07-06 ENCOUNTER — Ambulatory Visit: Payer: Medicare Other | Admitting: Gastroenterology

## 2017-07-15 ENCOUNTER — Other Ambulatory Visit: Payer: Medicare Other | Admitting: Adult Health

## 2017-07-19 ENCOUNTER — Ambulatory Visit: Payer: Self-pay | Admitting: Family Medicine

## 2017-07-20 ENCOUNTER — Ambulatory Visit (INDEPENDENT_AMBULATORY_CARE_PROVIDER_SITE_OTHER): Payer: Medicare Other | Admitting: Adult Health

## 2017-07-20 ENCOUNTER — Encounter: Payer: Self-pay | Admitting: Adult Health

## 2017-07-20 ENCOUNTER — Other Ambulatory Visit: Payer: Self-pay | Admitting: Adult Health

## 2017-07-20 VITALS — BP 140/88 | HR 92 | Ht 59.5 in | Wt 218.5 lb

## 2017-07-20 DIAGNOSIS — Z01411 Encounter for gynecological examination (general) (routine) with abnormal findings: Secondary | ICD-10-CM

## 2017-07-20 DIAGNOSIS — N9089 Other specified noninflammatory disorders of vulva and perineum: Secondary | ICD-10-CM | POA: Diagnosis not present

## 2017-07-20 DIAGNOSIS — R809 Proteinuria, unspecified: Secondary | ICD-10-CM | POA: Diagnosis not present

## 2017-07-20 DIAGNOSIS — Z113 Encounter for screening for infections with a predominantly sexual mode of transmission: Secondary | ICD-10-CM | POA: Diagnosis not present

## 2017-07-20 DIAGNOSIS — N898 Other specified noninflammatory disorders of vagina: Secondary | ICD-10-CM

## 2017-07-20 DIAGNOSIS — Z01419 Encounter for gynecological examination (general) (routine) without abnormal findings: Secondary | ICD-10-CM | POA: Insufficient documentation

## 2017-07-20 LAB — POCT URINALYSIS DIPSTICK
GLUCOSE UA: NEGATIVE
Ketones, UA: NEGATIVE
LEUKOCYTES UA: NEGATIVE
NITRITE UA: NEGATIVE
RBC UA: NEGATIVE

## 2017-07-20 LAB — POCT WET PREP (WET MOUNT)
Clue Cells Wet Prep Whiff POC: NEGATIVE
Trichomonas Wet Prep HPF POC: ABSENT

## 2017-07-20 MED ORDER — VALACYCLOVIR HCL 1 G PO TABS: 1000.0000 mg | ORAL_TABLET | Freq: Two times a day (BID) | ORAL | 1 refills | Status: DC

## 2017-07-20 NOTE — Addendum Note (Signed)
Addended by: Linton Rump on: 07/20/2017 05:03 PM   Modules accepted: Orders

## 2017-07-20 NOTE — Patient Instructions (Signed)
Physical and pap in 1 year  Cold Sore A cold sore, also called a fever blister, is a skin infection that causes small, fluid-filled sores to form inside of the mouth or on the lips, gums, nose, chin, or cheeks. Cold sores can spread to other parts of the body, such as the eyes or fingers. In some people with other medical conditions, cold sores can spread to multiple other body sites, including the genitals. Cold sores can be spread or passed from person to person (contagious) until the sores crust over completely. What are the causes? Cold sores are caused by the herpes simplex virus (HSV-1). HSV-1 is closely related to the virus that causes genital herpes (HSV-2), but these viruses are not the same. Once a person is infected with HSV-1, the virus remains permanently in the body. HSV-1 is spread from person to person through close contact, such as through kissing, touching the affected area, or sharing personal items such as lip balm, razors, or eating utensils. What increases the risk? A cold sore outbreak is more likely to develop in people who:  Are tired, stressed, or sick.  Are menstruating.  Are pregnant.  Take certain medicines.  Are exposed to cold weather or too much sun.  What are the signs or symptoms? Symptoms of a cold sore outbreak often go through different stages. Here is how a cold sore develops:  Tingling, itching, or burning is felt 1-2 days before the outbreak.  Fluid-filled blisters appear on the lips, inside the mouth, on the nose, or on the cheeks.  The blisters start to ooze clear fluid.  The blisters dry up and a yellow crust appears in its place.  The crust falls off.  Other symptoms include:  Fever.  Sore throat.  Headache.  Muscle aches.  Swollen neck glands.  You also may not have any symptoms. How is this diagnosed? This condition is often diagnosed based on your medical history and a physical exam. Your health care provider may swab your  sore and then examine it in the lab. Rarely, blood tests may be done to check for HSV-1. How is this treated? There is no cure for cold sores or HSV-1. There also is no vaccine for HSV-1. Most cold sores go away on their own without treatment within two weeks. Medicines cannot make the infection go away, but medicines can:  Help relieve some of the pain associated with the sores.  Work to stop the virus from multiplying.  Shorten healing time.  Medicines may be in the form of creams, gels, pills, or a shot. Follow these instructions at home: Medicines  Take or apply over-the-counter and prescription medicines only as told by your health care provider.  Use a cotton-tip swab to apply creams or gels to your sores. Sore Care  Do not touch the sores or pick the scabs.  Wash your hands often. Do not touch your eyes without washing your hands first.  Keep the sores clean and dry.  If directed, apply ice to the sores: ? Put ice in a plastic bag. ? Place a towel between your skin and the bag. ? Leave the ice on for 20 minutes, 2-3 times per day. Lifestyle  Do not kiss, have oral sex, or share personal items until your sores heal.  Eat a soft, bland diet. Avoid eating hot, cold, or salty foods. These can hurt your mouth.  Use a straw if it hurts to drink out of a glass.  Avoid the sun  and limit your stress if these things trigger outbreaks. If sun causes cold sores, apply sunscreen on your lips before being out in the sun. Contact a health care provider if:  You have symptoms for more than two weeks.  You have pus coming from the sores.  You have redness that is spreading.  You have pain or irritation in your eye.  You get sores on your genitals.  Your sores do not heal within two weeks.  You have frequent cold sore outbreaks. Get help right away if:  You have a fever and your symptoms suddenly get worse.  You have a headache and confusion. This information is not  intended to replace advice given to you by your health care provider. Make sure you discuss any questions you have with your health care provider. Document Released: 10/22/2000 Document Revised: 06/18/2016 Document Reviewed: 08/15/2015 Elsevier Interactive Patient Education  2018 [employer].  Genital Herpes Genital herpes is a common sexually transmitted infection (STI) that is caused by a virus. The virus spreads from person to person through sexual contact. Infection can cause itching, blisters, and sores around the genitals or rectum. Symptoms may last several days and then go away This is called an outbreak. However, the virus remains in your body, so you may have more outbreaks in the future. The time between outbreaks varies and can be months or years. Genital herpes affects men and women. It is particularly concerning for pregnant women because the virus can be passed to the baby during delivery and can cause serious problems. Genital herpes is also a concern for people who have a weak disease-fighting (immune) system. What are the causes? This condition is caused by the herpes simplex virus (HSV) type 1 or type 2. The virus may spread through:  Sexual contact with an infected person, including vaginal, anal, and oral sex.  Contact with fluid from a herpes sore.  The skin. This means that you can get herpes from an infected partner even if he or she does not have a visible sore or does not know that he or she is infected.  What increases the risk? You are more likely to develop this condition if:  You have sex with many partners.  You do not use latex condoms during sex.  What are the signs or symptoms? Most people do not have symptoms (asymptomatic) or have mild symptoms that may be mistaken for other skin problems. Symptoms may include:  Small red bumps near the genitals, rectum, or mouth. These bumps turn into blisters and then turn into sores.  Flu-like symptoms,  including: ? Fever. ? Body aches. ? Swollen lymph nodes. ? Headache.  Painful urination.  Pain and itching in the genital area or rectal area.  Vaginal discharge.  Tingling or shooting pain in the legs and buttocks.  Generally, symptoms are more severe and last longer during the first (primary) outbreak. Flu-like symptoms are also more common during the primary outbreak. How is this diagnosed? Genital herpes may be diagnosed based on:  A physical exam.  Your medical history.  Blood tests.  A test of a fluid sample (culture) from an open sore.  How is this treated? There is no cure for this condition, but treatment with antiviral medicines that are taken by mouth (orally) can do the following:  Speed up healing and relieve symptoms.  Help to reduce the spread of the virus to sexual partners.  Limit the chance of future outbreaks, or make future outbreaks shorter.  Lessen symptoms of future outbreaks.  Your health care provider may also recommend pain relief medicines, such as aspirin or ibuprofen. Follow these instructions at home: Sexual activity  Do not have sexual contact during active outbreaks.  Practice safe sex. Latex condoms and female condoms may help prevent the spread of the herpes virus. General instructions  Keep the affected areas dry and clean.  Take over-the-counter and prescription medicines only as told by your health care provider.  Avoid rubbing or touching blisters and sores. If you do touch blisters or sores: ? Wash your hands thoroughly with soap and water. ? Do not touch your eyes afterward.  To help relieve pain or itching, you may take the following actions as directed by your health care provider: ? Apply a cold, wet cloth (cold compress) to affected areas 4-6 times a day. ? Apply a substance that protects your skin and reduces bleeding (astringent). ? Apply a gel that helps relieve pain around sores (lidocaine gel). ? Take a warm,  shallow bath that cleans the genital area (sitz bath).  Keep all follow-up visits as told by your health care provider. This is important. How is this prevented?  Use condoms. Although anyone can get genital herpes during sexual contact, even with the use of a condom, a condom can provide some protection.  Avoid having multiple sexual partners.  Talk with your sexual partner about any symptoms either of you may have. Also, talk with your partner about any history of STIs.  Get tested for STIs before you have sex. Ask your partner to do the same.  Do not have sexual contact if you have symptoms of genital herpes. Contact a health care provider if:  Your symptoms are not improving with medicine.  Your symptoms return.  You have new symptoms.  You have a fever.  You have abdominal pain.  You have redness, swelling, or pain in your eye.  You notice new sores on other parts of your body.  You are a woman and experience bleeding between menstrual periods.  You have had herpes and you become pregnant or plan to become pregnant. Summary  Genital herpes is a common sexually transmitted infection (STI) that is caused by the herpes simplex virus (HSV) type 1 or type 2.  These viruses are most often spread through sexual contact with an infected person.  You are more likely to develop this condition if you have sex with many partners or you have unprotected sex.  Most people do not have symptoms (asymptomatic) or have mild symptoms that may be mistaken for other skin problems. Symptoms occur as outbreaks that may happen months or years apart.  There is no cure for this condition, but treatment with oral antiviral medicines can reduce symptoms, reduce the chance of spreading the virus to a partner, prevent future outbreaks, or shorten future outbreaks. This information is not intended to replace advice given to you by your health care provider. Make sure you discuss any questions you  have with your health care provider. Document Released: 10/22/2000 Document Revised: 09/24/2016 Document Reviewed: 09/24/2016 Elsevier Interactive Patient Education  2017 [employer].

## 2017-07-20 NOTE — Progress Notes (Signed)
Patient ID: Teresa Tran, female   DOB: [birth year], 36 y.o.   MRN: [id number] History of Present Illness: Teresa Tran is a 36 year old white female, married, G2P2, in for well woman gyn exam, she had normal pap with negative HPV 02/19/15.She is complaining of vaginal discharge and painful bump in "private area" and wants STD testing, says husband not acting right.Has had antibiotics and took diflucan. PCP is Dr [name].    Current Medications, Allergies, Past Medical History, Past Surgical History, Family History and Social History were reviewed in [employer] record.     Review of Systems: Patient denies any headaches, hearing loss, fatigue, blurred vision, shortness of breath, chest pain, abdominal pain, problems with bowel movements, urination, or intercourse. No joint pain or mood swings.See HPI for positives.     Physical Exam:BP 140/88 (BP Location: Left Arm, Patient Position: Sitting, Cuff Size: Large)   Pulse 92   Ht 4' 11.5" (1.511 m)   Wt 218 lb 8 oz (99.1 kg)   LMP 06/22/2017 (Approximate)   BMI 43.39 kg/m urine trace protein.  General:  Well developed, well nourished, no acute distress Skin:  Warm and dry Neck:  Midline trachea, normal thyroid, good ROM, no lymphadenopathy Lungs; Clear to auscultation bilaterally Breast:  No dominant palpable mass, retraction, or nipple discharge Cardiovascular: Regular rate and rhythm Abdomen:  Soft, non tender, no hepatosplenomegaly Pelvic:  External genitalia is normal in appearance, has redness and linear cut at clitoris, HSV culture obtained.  The vagina is normal in appearance, scant white discharge without odor. Urethra has no lesions or masses. The cervix is bulbous.  Uterus is felt to be normal size, shape, and contour.  No adnexal masses or tenderness noted.Bladder is non tender, no masses felt.Has razor bumps. GC/CHL obtained.Wet prep +few WBCs Extremities/musculoskeletal:  No swelling or varicosities noted, no clubbing or  cyanosis Psych:  No mood changes, alert and cooperative,seems happy PHQ 2 score 0.  Impression: 1. Encounter for well woman exam with routine gynecological exam   2. Vaginal discharge   3. Vulvar irritation   4. Screening examination for STD (sexually transmitted disease)   5. Proteinuria, unspecified type       Plan: GC/CHL sent UA C&S sent  Check HIV and RPR HSV culture sent Rx valtrex 1 gm #20 take 1 bid with 1 refill Review handout on herpes Physical and pap in 1 year Labs with PCP

## 2017-07-21 LAB — MICROSCOPIC EXAMINATION
Casts: NONE SEEN /lpf
Epithelial Cells (non renal): >10 /hpf — AB (ref 0–10)

## 2017-07-21 LAB — URINALYSIS, ROUTINE W REFLEX MICROSCOPIC
Bilirubin, UA: NEGATIVE
Glucose, UA: NEGATIVE
Nitrite, UA: NEGATIVE
PH UA: 6.5 (ref 5.0–7.5)
Protein, UA: NEGATIVE
RBC, UA: NEGATIVE
Urobilinogen, Ur: 0.2 mg/dL (ref 0.2–1.0)

## 2017-07-21 LAB — HIV ANTIBODY (ROUTINE TESTING W REFLEX): HIV SCREEN 4TH GENERATION: NONREACTIVE

## 2017-07-21 LAB — RPR: RPR Ser Ql: NONREACTIVE

## 2017-07-22 LAB — GC/CHLAMYDIA PROBE AMP
CHLAMYDIA, DNA PROBE: NEGATIVE
NEISSERIA GONORRHOEAE BY PCR: NEGATIVE

## 2017-07-22 LAB — URINE CULTURE

## 2017-07-23 LAB — HERPES SIMPLEX VIRUS CULTURE

## 2017-07-25 ENCOUNTER — Telehealth: Payer: Self-pay | Admitting: Adult Health

## 2017-07-25 NOTE — Telephone Encounter (Signed)
Left message that urine culture did not grow anything and that HIV,Syphilis and GC/CHl all negative and that herpes culture +for HSV 1 or cold sore herpes, finish meds, and hope you are feeling better, call with any questions or concerns

## 2017-07-28 DIAGNOSIS — F419 Anxiety disorder, unspecified: Secondary | ICD-10-CM | POA: Diagnosis not present

## 2017-08-15 DIAGNOSIS — R51 Headache: Secondary | ICD-10-CM | POA: Diagnosis not present

## 2017-08-15 DIAGNOSIS — H04123 Dry eye syndrome of bilateral lacrimal glands: Secondary | ICD-10-CM | POA: Diagnosis not present

## 2017-08-15 DIAGNOSIS — H5340 Unspecified visual field defects: Secondary | ICD-10-CM | POA: Diagnosis not present

## 2017-08-15 DIAGNOSIS — H16223 Keratoconjunctivitis sicca, not specified as Sjogren's, bilateral: Secondary | ICD-10-CM | POA: Diagnosis not present

## 2017-08-15 DIAGNOSIS — H5712 Ocular pain, left eye: Secondary | ICD-10-CM | POA: Diagnosis not present

## 2017-08-22 DIAGNOSIS — R079 Chest pain, unspecified: Secondary | ICD-10-CM | POA: Diagnosis not present

## 2017-08-22 DIAGNOSIS — E669 Obesity, unspecified: Secondary | ICD-10-CM | POA: Diagnosis not present

## 2017-08-22 DIAGNOSIS — B349 Viral infection, unspecified: Secondary | ICD-10-CM | POA: Diagnosis not present

## 2017-08-22 DIAGNOSIS — R05 Cough: Secondary | ICD-10-CM | POA: Diagnosis not present

## 2017-09-01 ENCOUNTER — Ambulatory Visit (INDEPENDENT_AMBULATORY_CARE_PROVIDER_SITE_OTHER): Payer: Medicare Other | Admitting: Family Medicine

## 2017-09-01 ENCOUNTER — Encounter: Payer: Self-pay | Admitting: Family Medicine

## 2017-09-01 VITALS — BP 146/96 | HR 100 | Temp 98.9°F | Resp 16 | Ht 60.0 in | Wt 217.1 lb

## 2017-09-01 DIAGNOSIS — I10 Essential (primary) hypertension: Secondary | ICD-10-CM | POA: Diagnosis not present

## 2017-09-01 DIAGNOSIS — R5382 Chronic fatigue, unspecified: Secondary | ICD-10-CM | POA: Diagnosis not present

## 2017-09-01 DIAGNOSIS — R7309 Other abnormal glucose: Secondary | ICD-10-CM | POA: Diagnosis not present

## 2017-09-01 DIAGNOSIS — Z13220 Encounter for screening for lipoid disorders: Secondary | ICD-10-CM

## 2017-09-01 DIAGNOSIS — D649 Anemia, unspecified: Secondary | ICD-10-CM | POA: Diagnosis not present

## 2017-09-01 DIAGNOSIS — R030 Elevated blood-pressure reading, without diagnosis of hypertension: Secondary | ICD-10-CM

## 2017-09-01 MED ORDER — CHLORTHALIDONE 25 MG PO TABS: 25.0000 mg | ORAL_TABLET | Freq: Every day | ORAL | 3 refills | Status: DC

## 2017-09-01 NOTE — Patient Instructions (Addendum)
Please place second request for Dr Juel Burrow records  Take the hygroton every day Continue to check your BP  You are due for blood work to check thyroid  See me in a month or two Call sooner for problems  Have Dr Merlene Laughter send me a copy of notes

## 2017-09-01 NOTE — Progress Notes (Signed)
Chief Complaint  Patient presents with  . Follow-up   Patient is here for follow-up. Old records from [facility] (2013) and Dr. Merlene Laughter and neurology have been reviewed.  I still do not have the records from Dr. [name].  Patient complains of chronic fatigue.  This is slightly better on the Ritalin 5 mg 3 times daily from her psychiatrist. She continues taking duloxetine 90 mg a day. Her blood pressure is elevated.  It has been elevated on multiple occasions over the last year.  I reviewed her history.  I reviewed her specialty notes.  She has hypertension in her family.  She also complains of mild pedal edema that is chronic.  We will start her on chlorthalidone 25 mg a day and follow her blood pressure. 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  This optometrist told her it might be MS.  She is going back to Dr. Merlene Laughter for follow-up.  She did have a workup for MS including MRI of brain lumbar puncture and labs earlier, tests are available from December 2017.  I explained that MS is unlikely although she believes that it was perhaps too early to diagnose.  She continues with her persistent believes that there is something wrong with her that no one can identify. I discussed with her that with depression, abdominal symptoms, chronic fatigue and chronic pain one diagnosis that would fit would be the fibromyalgia syndrome.  She is already on duloxetine.  She has never been tried on gabapentin or Lyrica  Patient Active Problem List   Diagnosis Date Noted  . Essential hypertension 09/01/2017  . Encounter for well woman exam with routine gynecological exam 07/20/2017  . Migraine 06/21/2017  . Chronic fatigue 06/21/2017  . Morbid obesity ([place]) 06/21/2017  . GERD (gastroesophageal reflux disease) 12/22/2016  . Uterine leiomyoma 12/02/2016  . Menorrhagia with irregular cycle 12/02/2016  . Irritable larynx syndrome 12/29/2015  . Normocytic  anemia 09/11/2014  . Low back pain 08/15/2014  . PUD (peptic ulcer disease) 06/06/2014  . Unspecified vitamin D deficiency 02/19/2013  . Bipolar 1 disorder ([place]) 03/23/2012  . PTSD (post-traumatic stress disorder) 03/23/2012    Outpatient Encounter Prescriptions as of 09/01/2017  Medication Sig  . DULoxetine (CYMBALTA) 30 MG capsule Take 30 mg by mouth 2 (two) times daily. 2 in am and one at night  . fluticasone (FLONASE) 50 MCG/ACT nasal spray Place 1 spray into both nostrils daily as needed for allergies or rhinitis.  . methylphenidate (RITALIN) 5 MG tablet Take 5 mg by mouth 3 (three) times daily with meals.   . ondansetron (ZOFRAN ODT) 4 MG disintegrating tablet 4mg  ODT q4 hours prn nausea/vomit  . SUMAtriptan (IMITREX) 6 MG/0.5ML SOLN injection Inject 6 mg into the skin every 2 (two) hours as needed for migraine or headache. May repeat in 2 hours if headache persists or recurs.  . valACYclovir (VALTREX) 1000 MG tablet Take 1 tablet (1,000 mg total) by mouth 2 (two) times daily.  . Vitamin D, Ergocalciferol, (DRISDOL) 50000 units CAPS capsule Take 50,000 Units by mouth every 7 (seven) days. For 12 weeks  . chlorthalidone (HYGROTON) 25 MG tablet Take 1 tablet (25 mg total) by mouth daily.   No facility-administered encounter medications on file as of 09/01/2017.     Allergies  Allergen Reactions  . Macrobid [Nitrofurantoin Monohyd Macro] Itching  . Risperdal [Risperidone] Other (See Comments)    Wt gain  Review of Systems  Constitutional: Positive for fatigue. Negative for chills and fever.  HENT: Negative for congestion and dental problem.   Eyes: Negative for photophobia and visual disturbance.  Respiratory: Negative for cough and shortness of breath.   Cardiovascular: Negative for chest pain and palpitations.  Gastrointestinal: Positive for abdominal pain and nausea. Negative for constipation and diarrhea.  Genitourinary: Positive for menstrual problem. Negative for  difficulty urinating.  Musculoskeletal: Positive for arthralgias, back pain, myalgias, neck pain and neck stiffness.  Neurological: Positive for headaches. Negative for weakness and numbness.  Psychiatric/Behavioral: Negative for dysphoric mood. The patient is not nervous/anxious.        Denies anxiety or mood disorder    BP (!) 146/96 (BP Location: Right Arm, Patient Position: Sitting, Cuff Size: Normal)   Pulse 100   Temp 98.9 F (37.2 C) (Temporal)   Resp 16   Ht 5' (1.524 m)   Wt 217 lb 1.3 oz (98.5 kg)   SpO2 99%   BMI 42.40 kg/m   Physical Exam  Constitutional: She is oriented to person, place, and time. She appears well-developed and well-nourished.  Obese  HENT:  Head: Normocephalic and atraumatic.  Neck: Normal range of motion. Neck supple.  Cardiovascular: Normal rate, regular rhythm and normal heart sounds.   Pulmonary/Chest: Effort normal and breath sounds normal.  Musculoskeletal:  Moves comfortably. No apparent abnormality of extremities or pain behaviors  Neurological: She is alert and oriented to person, place, and time.  Psychiatric: Thought content normal. She expresses inappropriate judgment.  Blunted, slightly bland affect.    ASSESSMENT/PLAN:  1. Elevated blood pressure, situational  2. Chronic fatigue - CBC with Differential/Platelet - COMPLETE METABOLIC PANEL WITH GFR - TSH - Fe+TIBC+Fer  3. Normocytic anemia  4. Elevated glucose level - Hemoglobin A1c  5. Screening cholesterol level - Lipid panel  6. Essential hypertension Start chlorthalidone 25 mg a day.  Follow blood pressure.   Patient Instructions  Please place second request for Dr Juel Burrow records  Take the hygroton every day Continue to check your BP  You are due for blood work to check thyroid  See me in a month or two Call sooner for problems  Have Dr Merlene Laughter send me a copy of notes    Raylene Everts, MD

## 2017-09-02 ENCOUNTER — Encounter: Payer: Self-pay | Admitting: Family Medicine

## 2017-09-02 LAB — COMPLETE METABOLIC PANEL WITH GFR
AG Ratio: 1.3 (calc) (ref 1.0–2.5)
ALBUMIN MSPROF: 3.9 g/dL (ref 3.6–5.1)
ALT: 22 U/L (ref 6–29)
AST: 25 U/L (ref 10–30)
Alkaline phosphatase (APISO): 78 U/L (ref 33–115)
BUN: 9 mg/dL (ref 7–25)
CALCIUM: 9 mg/dL (ref 8.6–10.2)
CO2: 30 mmol/L (ref 20–32)
CREATININE: 0.64 mg/dL (ref 0.50–1.10)
Chloride: 104 mmol/L (ref 98–110)
GFR, EST AFRICAN AMERICAN: 134 mL/min/{1.73_m2} (ref >=60)
GFR, Est Non African American: 116 mL/min/{1.73_m2} (ref >=60)
GLUCOSE: 89 mg/dL (ref 65–139)
Globulin: 3.1 g/dL (calc) (ref 1.9–3.7)
Potassium: 3.7 mmol/L (ref 3.5–5.3)
Sodium: 139 mmol/L (ref 135–146)
TOTAL PROTEIN: 7 g/dL (ref 6.1–8.1)
Total Bilirubin: 0.3 mg/dL (ref 0.2–1.2)

## 2017-09-02 LAB — CBC WITH DIFFERENTIAL/PLATELET
BASOS PCT: 0.4 %
Basophils Absolute: 30 cells/uL (ref 0–200)
EOS PCT: 0.8 %
Eosinophils Absolute: 60 cells/uL (ref 15–500)
HCT: 35.3 % (ref 35.0–45.0)
Hemoglobin: 11.7 g/dL (ref 11.7–15.5)
LYMPHS ABS: 1770 {cells}/uL (ref 850–3900)
MCH: 27.4 pg (ref 27.0–33.0)
MCHC: 33.1 g/dL (ref 32.0–36.0)
MCV: 82.7 fL (ref 80.0–100.0)
MPV: 10.4 fL (ref 7.5–12.5)
Monocytes Relative: 5.1 %
NEUTROS PCT: 70.1 %
Neutro Abs: 5258 cells/uL (ref 1500–7800)
PLATELETS: 227 10*3/uL (ref 140–400)
RBC: 4.27 10*6/uL (ref 3.80–5.10)
RDW: 13.8 % (ref 11.0–15.0)
TOTAL LYMPHOCYTE: 23.6 %
WBC mixed population: 383 cells/uL (ref 200–950)
WBC: 7.5 10*3/uL (ref 3.8–10.8)

## 2017-09-02 LAB — IRON,TIBC AND FERRITIN PANEL
%SAT: 15 % (ref 11–50)
FERRITIN: 18 ng/mL (ref 10–154)
IRON: 46 ug/dL (ref 40–190)
TIBC: 307 ug/dL (ref 250–450)

## 2017-09-02 LAB — TSH: TSH: 2.4 m[IU]/L

## 2017-09-02 LAB — LIPID PANEL
CHOLESTEROL: 196 mg/dL (ref <200)
HDL: 58 mg/dL (ref >50)
LDL CHOLESTEROL (CALC): 117 mg/dL — AB
Non-HDL Cholesterol (Calc): 138 mg/dL (calc) — ABNORMAL HIGH (ref <130)
TRIGLYCERIDES: 106 mg/dL (ref <150)
Total CHOL/HDL Ratio: 3.4 (calc) (ref <5.0)

## 2017-09-02 LAB — HEMOGLOBIN A1C
EAG (MMOL/L): 6.8 (calc)
Hgb A1c MFr Bld: 5.9 % of total Hgb — ABNORMAL HIGH (ref <5.7)
Mean Plasma Glucose: 123 (calc)

## 2017-09-05 DIAGNOSIS — H04123 Dry eye syndrome of bilateral lacrimal glands: Secondary | ICD-10-CM | POA: Diagnosis not present

## 2017-09-05 DIAGNOSIS — H5712 Ocular pain, left eye: Secondary | ICD-10-CM | POA: Diagnosis not present

## 2017-09-05 DIAGNOSIS — H01009 Unspecified blepharitis unspecified eye, unspecified eyelid: Secondary | ICD-10-CM | POA: Diagnosis not present

## 2017-09-07 ENCOUNTER — Ambulatory Visit (INDEPENDENT_AMBULATORY_CARE_PROVIDER_SITE_OTHER): Payer: Medicare Other | Admitting: Gastroenterology

## 2017-09-07 ENCOUNTER — Encounter: Payer: Self-pay | Admitting: Gastroenterology

## 2017-09-07 ENCOUNTER — Encounter: Payer: Self-pay | Admitting: *Deleted

## 2017-09-07 VITALS — BP 136/93 | HR 107 | Temp 98.4°F | Ht 60.0 in | Wt 215.0 lb

## 2017-09-07 DIAGNOSIS — K219 Gastro-esophageal reflux disease without esophagitis: Secondary | ICD-10-CM

## 2017-09-07 DIAGNOSIS — R809 Proteinuria, unspecified: Secondary | ICD-10-CM | POA: Insufficient documentation

## 2017-09-07 DIAGNOSIS — K769 Liver disease, unspecified: Secondary | ICD-10-CM

## 2017-09-07 MED ORDER — DEXLANSOPRAZOLE 60 MG PO CPDR: 60.0000 mg | DELAYED_RELEASE_CAPSULE | Freq: Every day | ORAL | 11 refills | Status: DC

## 2017-09-07 NOTE — Progress Notes (Signed)
Primary Care Physician: Raylene Everts, MD  Primary Gastroenterologist:  Barney Drain, MD   Chief Complaint  Patient presents with  . Gastroesophageal Reflux    worse at night  . bloating/gas    denies diarrhea/abd pain    HPI: Teresa Tran is a 36 y.o. female here for follow-up.  She was last seen in February 2018.  Since we last saw her she has been having issues with her blood pressure being elevated.  Mild pedal edema as well.  She is seen an optometrist who said she had nerve damage in her eye needed going to follow-up with Dr. Merlene Laughter.  Previously had MS workup with MRI of the brain, lumbar puncture and labs last year.  Dr. Meda Coffee is concerned she may have fibromyalgia given chronic pain, chronic fatigue, depression and abdominal symptoms.  She is also seeing arrange for orthopedics for joint pain and receiving steroid shots.  Previously has seen rheumatology and plans to go back for follow-up.  Patient complains of nocturnal cough.  Denies notable reflux at nighttime.  No longer on PPI.  Recently seen by pulmonary for chronic dry cough possibly related to GERD.  Denies abdominal pain.  No dysphagia.  Stools sometimes go from solid to liquid but doing ok currently. No melena, brbpr.  Complains of abdominal bloating and gas.   Ritalin due to severe fatigue.   She has h/o right liver lobe lesion on prior u/s and requests f/u. Also concerned about having protein in her urine.   Current Outpatient Prescriptions  Medication Sig Dispense Refill  . chlorthalidone (HYGROTON) 25 MG tablet Take 1 tablet (25 mg total) by mouth daily. 90 tablet 3  . DULoxetine (CYMBALTA) 30 MG capsule Take 30 mg by mouth 2 (two) times daily. 2 in am and one at night    . fluticasone (FLONASE) 50 MCG/ACT nasal spray Place 1 spray into both nostrils daily as needed for allergies or rhinitis.    . methylphenidate (RITALIN) 5 MG tablet Take 5 mg by mouth 3 (three) times daily with meals.     .  ondansetron (ZOFRAN ODT) 4 MG disintegrating tablet 4mg  ODT q4 hours prn nausea/vomit 12 tablet 0  . SUMAtriptan (IMITREX) 6 MG/0.5ML SOLN injection Inject 6 mg into the skin every 2 (two) hours as needed for migraine or headache. May repeat in 2 hours if headache persists or recurs.    . Vitamin D, Ergocalciferol, (DRISDOL) 50000 units CAPS capsule Take 50,000 Units by mouth every 7 (seven) days. For 12 weeks     No current facility-administered medications for this visit.     Allergies as of 09/07/2017 - Review Complete 09/07/2017  Allergen Reaction Noted  . Macrobid [nitrofurantoin monohyd macro] Itching 01/09/2014  . Risperdal [risperidone] Other (See Comments) 10/11/2012    ROS:  General: Negative for anorexia, weight loss, fever, chills, fatigue, weakness. ENT: Negative for hoarseness, difficulty swallowing , nasal congestion. CV: Negative for chest pain, angina, palpitations, dyspnea on exertion, peripheral edema.  Respiratory: Negative for dyspnea at rest, dyspnea on exertion,   sputum, wheezing. +cough GI: See history of present illness. GU:  Negative for dysuria, hematuria, urinary incontinence, urinary frequency, nocturnal urination.  Endo: Negative for unusual weight change.    Physical Examination:   BP 136/63   Pulse (!) 107   Temp 98.4 F (36.9 C) (Oral)   Ht 5' (1.524 m)   Wt 215 lb (97.5 kg)   LMP 08/07/2017   BMI 41.99  kg/m   General: Well-nourished, well-developed in no acute distress.  Eyes: No icterus. Mouth: Oropharyngeal mucosa moist and pink , no lesions erythema or exudate. Lungs: Clear to auscultation bilaterally.  Heart: Regular rate and rhythm, no murmurs rubs or gallops.  Abdomen: Bowel sounds are normal, nontender, nondistended, no hepatosplenomegaly or masses, no abdominal bruits or hernia , no rebound or guarding.   Extremities: No lower extremity edema. No clubbing or deformities. Neuro: Alert and oriented x 4   Skin: Warm and dry, no  jaundice.   Psych: Alert and cooperative, normal mood and affect.  Labs:  Lab Results  Component Value Date   CREATININE 0.64 09/01/2017   BUN 9 09/01/2017   NA 139 09/01/2017   K 3.7 09/01/2017   CL 104 09/01/2017   CO2 30 09/01/2017   Lab Results  Component Value Date   ALT 22 09/01/2017   AST 25 09/01/2017   ALKPHOS 78 09/01/2017   BILITOT 0.3 09/01/2017   Lab Results  Component Value Date   WBC 7.5 09/01/2017   HGB 11.7 09/01/2017   HCT 35.3 09/01/2017   MCV 82.7 09/01/2017   PLT 227 09/01/2017   Lab Results  Component Value Date   IRON 46 09/01/2017   TIBC 307 09/01/2017   FERRITIN 18 09/01/2017   Lab Results  Component Value Date   TSH 2.40 09/01/2017   Lab Results  Component Value Date   HGBA1C 5.9 (H) 09/01/2017    Imaging Studies: No results found.

## 2017-09-07 NOTE — Patient Instructions (Signed)
1. Start dexilant once daily before breakfast. RX sent to your pharmacy. Samples provided.  2. Ultrasound as scheduled.

## 2017-09-12 ENCOUNTER — Ambulatory Visit (HOSPITAL_COMMUNITY): Admission: RE | Admit: 2017-09-12 | Payer: Medicare Other | Source: Ambulatory Visit

## 2017-09-13 NOTE — Assessment & Plan Note (Signed)
Has not been on PPI therapy.  Complains of recurrent nocturnal coughing.  Does not appreciate regurgitation at nighttime.  Some heartburn.  Previously did well on Dexilant.  Would like a prescription.  She will call if symptoms do not settle down.

## 2017-09-13 NOTE — Progress Notes (Signed)
CC'ED TO PCP 

## 2017-09-13 NOTE — Progress Notes (Signed)
ON RECALL  °

## 2017-09-13 NOTE — Assessment & Plan Note (Signed)
Follow-up abdominal ultrasound to determine assess for liver lesions stability and evaluate kidneys for proteinuria.

## 2017-09-13 NOTE — Progress Notes (Signed)
Needs ov with [facility] only in four months.

## 2017-09-21 ENCOUNTER — Ambulatory Visit (HOSPITAL_COMMUNITY): Admission: RE | Admit: 2017-09-21 | Payer: Medicare Other | Source: Ambulatory Visit

## 2017-09-27 ENCOUNTER — Ambulatory Visit (HOSPITAL_COMMUNITY): Admission: RE | Admit: 2017-09-27 | Payer: Medicare Other | Source: Ambulatory Visit

## 2017-10-03 ENCOUNTER — Other Ambulatory Visit: Payer: Self-pay

## 2017-10-03 ENCOUNTER — Ambulatory Visit (INDEPENDENT_AMBULATORY_CARE_PROVIDER_SITE_OTHER): Payer: Medicare Other | Admitting: Family Medicine

## 2017-10-03 ENCOUNTER — Encounter: Payer: Self-pay | Admitting: Family Medicine

## 2017-10-03 VITALS — BP 122/82 | HR 80 | Temp 98.5°F | Resp 16 | Ht 60.0 in | Wt 209.1 lb

## 2017-10-03 DIAGNOSIS — I10 Essential (primary) hypertension: Secondary | ICD-10-CM | POA: Diagnosis not present

## 2017-10-03 DIAGNOSIS — R5382 Chronic fatigue, unspecified: Secondary | ICD-10-CM | POA: Diagnosis not present

## 2017-10-03 NOTE — Patient Instructions (Signed)
Congratulations on the weight loss Continue current medicines Your blood pressure is great  See me in 3 months

## 2017-10-03 NOTE — Progress Notes (Signed)
Chief Complaint  Patient presents with  . Follow-up    1 month  When she was last here, and he was found to be hypertensive.  She was started on chlorthalidone.  She is taking it daily.  She is back for follow-up.  Her blood pressure today is well controlled. At her last visit she told me she had an abnormal eye exam.  She told me she thought she had MS.  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  She is using a steroid eyedrop 4 times a day.  It has not improved her eye pain or symptoms.  As I have previously discussed, I have concerns that she may have a somatization disorder.   Patient Active Problem List   Diagnosis Date Noted  . Liver lesion 09/07/2017  . Proteinuria 09/07/2017  . Essential hypertension 09/01/2017  . Encounter for well woman exam with routine gynecological exam 07/20/2017  . Migraine 06/21/2017  . Chronic fatigue 06/21/2017  . Morbid obesity ([place]) 06/21/2017  . GERD (gastroesophageal reflux disease) 12/22/2016  . Uterine leiomyoma 12/02/2016  . Menorrhagia with irregular cycle 12/02/2016  . Irritable larynx syndrome 12/29/2015  . Normocytic anemia 09/11/2014  . Low back pain 08/15/2014  . PUD (peptic ulcer disease) 06/06/2014  . Unspecified vitamin D deficiency 02/19/2013  . Bipolar 1 disorder ([place]) 03/23/2012  . PTSD (post-traumatic stress disorder) 03/23/2012    Outpatient Encounter Medications as of 10/03/2017  Medication Sig  . chlorthalidone (HYGROTON) 25 MG tablet Take 1 tablet (25 mg total) by mouth daily.  Marland Kitchen dexlansoprazole (DEXILANT) 60 MG capsule Take 1 capsule (60 mg total) by mouth daily before breakfast.  . DULoxetine (CYMBALTA) 30 MG capsule Take 30 mg by mouth 2 (two) times daily. 2 in am and one at night  . fluorometholone (FML) 0.1 % ophthalmic suspension   . fluticasone (FLONASE) 50 MCG/ACT nasal spray Place 1 spray into both nostrils daily as needed for allergies or rhinitis.  . methylphenidate (RITALIN) 5 MG  tablet Take 5 mg by mouth 3 (three) times daily with meals.   . ondansetron (ZOFRAN ODT) 4 MG disintegrating tablet 4mg  ODT q4 hours prn nausea/vomit  . SUMAtriptan (IMITREX) 6 MG/0.5ML SOLN injection Inject 6 mg into the skin every 2 (two) hours as needed for migraine or headache. May repeat in 2 hours if headache persists or recurs.  . Vitamin D, Ergocalciferol, (DRISDOL) 50000 units CAPS capsule Take 50,000 Units by mouth every 7 (seven) days. For 12 weeks   No facility-administered encounter medications on file as of 10/03/2017.     Allergies  Allergen Reactions  . Macrobid [Nitrofurantoin Monohyd Macro] Itching  . Risperdal [Risperidone] Other (See Comments)    Wt gain    Review of Systems  Constitutional: Positive for fatigue. Negative for chills and fever.       Chronic  HENT: Negative for congestion and dental problem.   Eyes: Positive for pain and visual disturbance. Negative for photophobia.  Respiratory: Negative for cough and shortness of breath.   Cardiovascular: Negative for chest pain and palpitations.  Gastrointestinal: Positive for abdominal pain and nausea. Negative for constipation and diarrhea.  Genitourinary: Negative for difficulty urinating and frequency.  Musculoskeletal: Positive for arthralgias, back pain, myalgias, neck pain and neck stiffness.       Intermittent, chronic  Neurological: Positive for headaches. Negative for weakness and numbness.       Patient states from eye pain  Psychiatric/Behavioral: Negative for dysphoric  mood. The patient is not nervous/anxious.        Denies anxiety or mood disorder     BP 122/82 (BP Location: Left Arm, Patient Position: Sitting, Cuff Size: Normal)   Pulse 80   Temp 98.5 F (36.9 C) (Temporal)   Resp 16   Ht 5' (1.524 m)   Wt 209 lb 1.3 oz (94.8 kg)   LMP 09/19/2017 (Exact Date)   SpO2 98%   BMI 40.83 kg/m   Physical Exam  Constitutional: She is oriented to person, place, and time. She appears  well-developed and well-nourished.  Obese  HENT:  Head: Normocephalic and atraumatic.  Eyes: Conjunctivae and EOM are normal. Pupils are equal, round, and reactive to light.  Neck: Normal range of motion. Neck supple.  Cardiovascular: Normal rate, regular rhythm and normal heart sounds.  Pulmonary/Chest: Effort normal and breath sounds normal.  Musculoskeletal:  Moves comfortably. No apparent abnormality of extremities or pain behaviors  Neurological: She is alert and oriented to person, place, and time.  Psychiatric: Thought content normal. She expresses inappropriate judgment.  Blunted, slightly bland affect.    ASSESSMENT/PLAN:  1. Chronic fatigue No laboratory evidence of medical illness  2. Essential hypertension Trolled on chlorthalidone   Patient Instructions  Congratulations on the weight loss Continue current medicines Your blood pressure is great  See me in 3 months   Teresa Everts, MD

## 2017-10-12 ENCOUNTER — Ambulatory Visit (HOSPITAL_COMMUNITY): Admission: RE | Admit: 2017-10-12 | Payer: Medicare Other | Source: Ambulatory Visit

## 2017-10-25 ENCOUNTER — Ambulatory Visit (HOSPITAL_COMMUNITY)
Admission: RE | Admit: 2017-10-25 | Discharge: 2017-10-25 | Disposition: A | Discharge status: Home / Self Care | Payer: Medicare Other | Source: Ambulatory Visit | Attending: Gastroenterology | Admitting: Gastroenterology

## 2017-10-25 DIAGNOSIS — R809 Proteinuria, unspecified: Secondary | ICD-10-CM | POA: Diagnosis not present

## 2017-10-25 DIAGNOSIS — Z9049 Acquired absence of other specified parts of digestive tract: Secondary | ICD-10-CM | POA: Insufficient documentation

## 2017-10-25 DIAGNOSIS — K769 Liver disease, unspecified: Secondary | ICD-10-CM | POA: Insufficient documentation

## 2017-11-07 ENCOUNTER — Telehealth: Payer: Self-pay

## 2017-11-07 NOTE — Telephone Encounter (Signed)
Pt called office asking about her US abdomen results from 10/25/17. Informed her that [facility] is out of office today and tomorrow. Our office will contact her with results. [phone].

## 2017-11-09 ENCOUNTER — Telehealth: Payer: Self-pay | Admitting: Gastroenterology

## 2017-11-09 NOTE — Progress Notes (Signed)
Tried to call pt. Could not leave a message.  °

## 2017-11-09 NOTE — Progress Notes (Signed)
Please let patient know that liver appears to be fatty.  Previous LFTs were normal.  Liver lesion 7 mm, stable, consistent with tiny hemangioma.  Kidneys look normal on ultrasound.  Follow-up with PCP regarding concerns for protein in her urine.  For fatty liver: Instructions for fatty liver: Recommend 1-2# weight loss per week until ideal body weight through exercise & diet. Low fat/cholesterol diet.   Avoid sweets, sodas, fruit juices, sweetened beverages like tea, etc. Gradually increase exercise from 15 min daily up to 1 hr per day 5 days/week. Limit alcohol use.  GO AHEAD AND MAKE HER OV WITH [facility] ONLY FOR 12/2017. DX: FATTY LIVER ON U/S, GERD

## 2017-11-09 NOTE — Telephone Encounter (Signed)
Left Vm to call.

## 2017-11-09 NOTE — Progress Notes (Signed)
Pt is aware. Forwarding to Stacey to schedule appt.  

## 2017-11-09 NOTE — Telephone Encounter (Signed)
SEE RESULT NOTE 

## 2017-11-09 NOTE — Progress Notes (Signed)
To Stacey.

## 2017-11-09 NOTE — Progress Notes (Signed)
Tried to call and could not leave a VM.

## 2017-11-09 NOTE — Progress Notes (Signed)
See phone note. Called and left message for a return call.

## 2017-11-09 NOTE — Telephone Encounter (Signed)
See result note.  

## 2017-11-09 NOTE — Telephone Encounter (Signed)
[phone] home or work [phone] ext [phone]  Please call her is her ultrasound has been reviewed

## 2017-11-10 ENCOUNTER — Encounter: Payer: Self-pay | Admitting: Gastroenterology

## 2017-11-10 NOTE — Progress Notes (Signed)
PATIENT SCHEDULED  °

## 2017-12-05 DIAGNOSIS — F419 Anxiety disorder, unspecified: Secondary | ICD-10-CM | POA: Diagnosis not present

## 2017-12-21 ENCOUNTER — Encounter: Payer: Self-pay | Admitting: Gastroenterology

## 2017-12-21 ENCOUNTER — Ambulatory Visit (INDEPENDENT_AMBULATORY_CARE_PROVIDER_SITE_OTHER): Payer: Medicare Other | Admitting: Gastroenterology

## 2017-12-21 DIAGNOSIS — G8320 Monoplegia of upper limb affecting unspecified side: Secondary | ICD-10-CM | POA: Diagnosis not present

## 2017-12-21 DIAGNOSIS — G894 Chronic pain syndrome: Secondary | ICD-10-CM | POA: Diagnosis not present

## 2017-12-21 DIAGNOSIS — R1013 Epigastric pain: Secondary | ICD-10-CM | POA: Diagnosis not present

## 2017-12-21 NOTE — Progress Notes (Signed)
Subjective:    Patient ID: Teresa Tran, female    DOB: 22-Sep-1981, 37 y.o.   MRN: [id number]  Raylene Everts, MD   HPI QUESTION ABOUT FAT IN THE LIVER. TROUBLE WITH BLOATING. LAST NIGHT GETS BLOATED AND FEELS LIKE SHE CAN'T BREATHE. HAPPENS MORE OFTEN(EVERY WEEK). BP MEDS HAVE HELPED AND GOT FLUID OFF. APPETITE: LESSENED. FEELS NAUSEATED OFTEN:3-4X/WEEK. SKIP MEALS. SNACKS: NOTHING. DRINKS: WATER, SPRITE (1-3 AROUND DINNER). HEARTBURN: OCCASIONALLY WITH DEXILANT. BMs: DAILY(# 4, IF DOESN'T EAT AND THEN GOES TO BATHROOM #7). MAY GET CHEST TIGHTNESS: 1-2X/WEEK AND MAY BE RANDOM OR DUE TO STRESS.  MILK MAKES HER GO T THE BATHROOM. BREAD MAY CAUSE TROUBLE WITH SWALLOWING. OCCASIONAL TWINGE OF STOMACH CRAMPS. MAY HAVE SWELLING IN FET AND HANDS.  PT DENIES FEVER, CHILLS, HEMATOCHEZIA, HEMATEMESIS, vomiting, melena, SHORTNESS OF BREATH, CHANGE IN BOWEL IN HABITS, OR constipation.  Past Medical History:  Diagnosis Date  . Allergy    medicine  . Anemia   . Anxiety   . Bipolar disorder ([place])   . BV (bacterial vaginosis) 08/15/2014  . CSF leak   . Depression   . GERD (gastroesophageal reflux disease)   . Headache(784.0)   . Hypertension   . Low back pain 08/15/2014  . Obesity   . Ulcer   . Urinary frequency 08/30/2014  . Vaginal discharge 08/15/2014  . Vaginal irritation 02/12/2014  . Yeast infection 02/12/2014   Past Surgical History:  Procedure Laterality Date  . CHOLECYSTECTOMY  Feb 2015   Dr. Ladona Horns, [place]  . ENDOSCOPIC CONCHA BULLOSA RESECTION Left 07/20/2016   Procedure: ENDOSCOPIC LEFT  CONCHA BULLOSA RESECTION;  Surgeon: Leta Baptist, MD;  Location: [facility];  Service: ENT;  Laterality: Left;  . 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 ESOPHAGOGASTRODUODENOSCOPY N/A 05/06/2015   Procedure: ESOPHAGOGASTRODUODENOSCOPY (EGD);  Surgeon: Danie Binder, MD;  [facility];   Service: Endoscopy;  Laterality: N/A;  1300 - moved to 1:15 - office to notify  . ESOPHAGOGASTRODUODENOSCOPY (EGD) WITH ESOPHAGEAL DILATION N/A 02/11/2014   Dr. 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 EXCISION CHONCHA BULLOSA N/A 07/20/2016   Procedure: NASAL SEPTOPLASTY WITH BILATERAL TURBINATE REDUCTION;  Surgeon: Leta Baptist, MD;  Location: [facility];  Service: ENT;  Laterality: N/A;  . RHINOPLASTY    . SAVORY DILATION N/A 05/06/2015   Procedure: SAVORY DILATION;  Surgeon: Danie Binder, MD;  [facility];  Service: Endoscopy;  Laterality: N/A;  . TUBAL LIGATION     Allergies  Allergen Reactions  . Macrobid [Nitrofurantoin Monohyd Macro] Itching  . Risperdal [Risperidone] Other (See Comments)    Wt gain   Current Outpatient Medications  Medication Sig Dispense Refill  . chlorthalidone (HYGROTON) 25 MG tablet Take 1 tablet (25 mg total) by mouth daily.    Marland Kitchen dexlansoprazole (DEXILANT) 60 MG capsule Take 1 capsule (60 mg total) by mouth daily before breakfast.    . DULoxetine (CYMBALTA) 30 MG capsule Take 30 mg by mouth 2 (two) times daily. 2 in am and one at night    . methylphenidate (RITALIN) 5 MG tablet Take 10 mg by mouth 3 (three) times daily with meals.     . ondansetron (ZOFRAN ODT) 4 MG disintegrating tablet 4mg  ODT q4 hours prn nausea/vomit    . SUMAtriptan (IMITREX) 6 MG/0.5ML SOLN injection Inject 6 mg into the skin every 2 (two) hours as needed for  migraine or headache. May repeat in 2 hours if headache persists or recurs.    . Vitamin D, Ergocalciferol, (DRISDOL) 50000 units CAPS capsule Take 50,000 Units by mouth every 7 (seven) days. For 12 weeks    . fluorometholone (FML) 0.1 % ophthalmic suspension     . fluticasone (FLONASE) 50 MCG/ACT nasal spray Place 1 spray into both nostrils daily as needed for allergies or rhinitis.     Review of Systems PER HPI OTHERWISE ALL SYSTEMS ARE NEGATIVE.    Objective:    Physical Exam  Constitutional: She is oriented to person, place, and time. She appears well-developed and well-nourished. No distress.  HENT:  Head: Normocephalic and atraumatic.  Mouth/Throat: Oropharynx is clear and moist. No oropharyngeal exudate.  Eyes: Pupils are equal, round, and reactive to light. No scleral icterus.  Neck: Normal range of motion. Neck supple.  Cardiovascular: Normal rate, regular rhythm and normal heart sounds.  Pulmonary/Chest: Effort normal and breath sounds normal. No respiratory distress.  Abdominal: Soft. Bowel sounds are normal. She exhibits no distension. There is no tenderness.  Musculoskeletal: She exhibits no edema.  Lymphadenopathy:    She has no cervical adenopathy.  Neurological: She is alert and oriented to person, place, and time.  NO FOCAL DEFICITS  Psychiatric:  FLAT AFFECT, SLIGHTLY ANXIOUS MOOD  Vitals reviewed.     Assessment & Plan:

## 2017-12-21 NOTE — Assessment & Plan Note (Signed)
DUE TO DIETARY CHOICES AND FASTING ALL DAY. SYMPTOMS EXACERBATED BU CERTAIN FOOD INTOLERANCES.  EAT 200 CALORIES EVERY 4 HOURS. CONTINUE YOUR WEIGHT LOSS EFFORTS. We didn't have IBGARD. Take RESTORA RX DAILY. TAKE A PROBIOTIC OR IBGRAD DAILY FOR THREE MOS ([employer], [place]).  DRINK WATER TO KEEP YOUR URINE LIGHT YELLOW.  Do not drink SODA, AVOID HIGH FRUCTOSE CORN SYRUP, chew SUGAR FREE GUM, OR USE ARTIFICIAL SWEETENERS. USE STEVIA AS A SWEETENER OR AGAVE NECTAR. AVOID ITEMS THAT CAUSE BLOATING & GAS.  HANDOUT GIVEN. PLEASE CALL IN TWO MONTH IF SYMPTOMS ARE NOT IMPROVED.   FOLLOW UP IN 4 MOS.

## 2017-12-21 NOTE — Patient Instructions (Addendum)
CONTINUE YOUR WEIGHT LOSS EFFORTS.  EAT 200 CALORIES EVERY 4 HOURS.  We didn't have IBGARD. Take RESTORA RX DAILY. TAKE A PROBIOTIC OR IBGRAD DAILY FOR THREE MOS ([facility], [place]).   DRINK WATER TO KEEP YOUR URINE LIGHT YELLOW.   Do not drink SODA, AVOID HIGH FRUCTOSE CORN SYRUP, chew SUGAR FREE GUM, OR USE ARTIFICIAL SWEETENERS. USE STEVIA AS A SWEETENER OR AGAVE NECTAR.  AVOID ITEMS THAT CAUSE BLOATING & GAS. SEE INFO BELOW.   PLEASE CALL IN TWO MONTH IF SYMPTOMS ARE NOT IMPROVED.   FOLLOW UP IN 4 MOS.    BLOATING AND GAS PREVENTION  Although gas may be uncomfortable and embarrassing, it is not life-threatening. Understanding causes, ways to reduce symptoms, and treatment will help most people find some relief.  Points to remember . Everyone has gas in the digestive tract. Marland Kitchen People often believe normal passage of gas to be excessive. . Gas comes from two main sources: swallowed air and normal breakdown of certain foods by harmless bacteria naturally present in the large intestine. . Many foods with carbohydrates can cause gas. Fats and proteins cause little gas. . Foods that may cause gas include o beans  o vegetables, such as broccoli, cabbage, brussels sprouts, onions, artichokes, and asparagus  o fruits, such as pears, apples, and peaches  o whole grains, such as whole wheat and bran  o soft drinks and fruit drinks  o milk and milk products, such as cheese and ice cream, and packaged foods prepared with lactose, such as bread, cereal, and salad dressing  o foods containing sorbitol, such as dietetic foods and sugar free candies and gums  . The most common symptoms of gas are belching, flatulence, bloating, and abdominal pain. However, some of these symptoms are often caused by an intestinal disorder, such as irritable bowel syndrome, rather than too much gas. . The most common ways to reduce the discomfort of gas are changing diet, taking  nonprescription medicines, and reducing the amount of air swallowed. . Digestive enzymes, such as lactase supplements, actually help digest carbohydrates and may allow people to eat foods that normally cause gas.

## 2017-12-21 NOTE — Progress Notes (Signed)
cc'ed to pcp °

## 2017-12-22 ENCOUNTER — Other Ambulatory Visit: Payer: Self-pay | Admitting: Chiropractic Medicine

## 2017-12-22 DIAGNOSIS — G8320 Monoplegia of upper limb affecting unspecified side: Secondary | ICD-10-CM

## 2017-12-22 DIAGNOSIS — R29898 Other symptoms and signs involving the musculoskeletal system: Secondary | ICD-10-CM

## 2017-12-22 NOTE — Progress Notes (Signed)
CC'D TO PCP °

## 2017-12-26 DIAGNOSIS — H5712 Ocular pain, left eye: Secondary | ICD-10-CM | POA: Diagnosis not present

## 2017-12-26 DIAGNOSIS — H04123 Dry eye syndrome of bilateral lacrimal glands: Secondary | ICD-10-CM | POA: Diagnosis not present

## 2017-12-26 DIAGNOSIS — H01009 Unspecified blepharitis unspecified eye, unspecified eyelid: Secondary | ICD-10-CM | POA: Diagnosis not present

## 2017-12-29 ENCOUNTER — Encounter: Payer: Self-pay | Admitting: Neurology

## 2017-12-31 ENCOUNTER — Ambulatory Visit
Admission: RE | Admit: 2017-12-31 | Discharge: 2017-12-31 | Disposition: A | Discharge status: Home / Self Care | Payer: Medicare Other | Source: Ambulatory Visit | Attending: Chiropractic Medicine | Admitting: Chiropractic Medicine

## 2017-12-31 DIAGNOSIS — R29898 Other symptoms and signs involving the musculoskeletal system: Secondary | ICD-10-CM

## 2017-12-31 DIAGNOSIS — R531 Weakness: Secondary | ICD-10-CM | POA: Diagnosis not present

## 2017-12-31 DIAGNOSIS — G8320 Monoplegia of upper limb affecting unspecified side: Secondary | ICD-10-CM

## 2018-01-02 ENCOUNTER — Ambulatory Visit: Payer: Self-pay | Admitting: Family Medicine

## 2018-01-03 ENCOUNTER — Other Ambulatory Visit: Payer: Medicare Other

## 2018-01-03 ENCOUNTER — Encounter: Payer: Self-pay | Admitting: Neurology

## 2018-01-03 ENCOUNTER — Ambulatory Visit (INDEPENDENT_AMBULATORY_CARE_PROVIDER_SITE_OTHER): Payer: Medicare Other | Admitting: Neurology

## 2018-01-03 VITALS — BP 124/70 | HR 90 | Ht 60.0 in | Wt 208.0 lb

## 2018-01-03 DIAGNOSIS — R5383 Other fatigue: Secondary | ICD-10-CM

## 2018-01-03 DIAGNOSIS — M2550 Pain in unspecified joint: Secondary | ICD-10-CM

## 2018-01-03 DIAGNOSIS — M791 Myalgia, unspecified site: Secondary | ICD-10-CM

## 2018-01-03 DIAGNOSIS — R531 Weakness: Secondary | ICD-10-CM

## 2018-01-03 DIAGNOSIS — H5712 Ocular pain, left eye: Secondary | ICD-10-CM | POA: Diagnosis not present

## 2018-01-03 NOTE — Patient Instructions (Addendum)
We will check vitamin B12 and CK.  Further recommendations pending results. If testing unremarkable, then I don't suspect a neurologic etiology for your symptoms.  If autoimmune disease is suspected, then I would recommend a second opinion from rheumatology at an academic center Your provider has requested that you have labwork completed today. Please go to [facility] ([id number]) on the second floor of this building before leaving the office today. You do not need to check in. If you are not called within 15 minutes please check with the front desk.

## 2018-01-03 NOTE — Progress Notes (Signed)
NEUROLOGY CONSULTATION NOTE  Teresa Tran MRN: [id number] DOB: August 01, 1981  Referring provider: Dr. Rosana Hoes Primary care provider: Dr. Meda Coffee  Reason for consult:  Multiple symptomatology including eye pain, weakness, arthralgias  HISTORY OF PRESENT ILLNESS: Teresa Tran is a 37 year old female who presents for multiple symptomatology.  Brain and cervical MRIs personally reviewed.  In June 2017, she was diagnosed and treated for [facility] Fever after a tick bite and developing headache, fever, arthralgias and neck stiffness.  At the time, Lyme was tested and negative.  Symptoms persisted and progressed after finishing course of antibiotics.  -She developed diffuse moderate to severe non-throbbing headache of variable duration and frequency.  She subsequently had surgery for deviated septum and headaches became worse.  She underwent LP to assess for low-pressure headache, which was normal.  CSF was not sent for analysis.  They are infrequent now.  There is associated photophobia but no associated nausea, vomiting, phonophobia or visual disturbance.  She takes sumatriptan injection, which is not effective.  She reports history of "sinus headaches" but not migraines.  She has episodic left eye pain, described as a stabbing pain.  It may last 2 hours at a time and occurs almost daily.  When she looks up, she has aggravated eye discomfort.  Again, there is no associated visual disturbance.  Topiramate, gabapentin and amitriptyline were ineffective. -She also reports generalized fatigue and diffuse muscle weakness, particularly in her arms.  She has generalized joint pain and pain in her extremities.  She has difficulty holding up a newspaper or gripping door handles.  She also has low back pain radiating into the buttocks. She is treated by pain medicine. -She reports bilateral tinnitus.   -The other day, she reported tingling over her right foot. She reports easily bruising and  rashes  She has seen several specialists, including neurology, rheumatology and ophthalmology, with extensive workup: - MRI of brain without contrast from 11/03/16 was normal.  Repeat MRI of brain without contrast from 12/31/17 was normal.  MRI of cervical spine from 12/31/17 was normal. - ANA, Lyme, RPR negative.  TSH 2.40. - Sleep study negative  PAST MEDICAL HISTORY: Past Medical History:  Diagnosis Date  . Allergy    medicine  . Anemia   . Anxiety   . Bipolar disorder ([place])   . BV (bacterial vaginosis) 08/15/2014  . CSF leak   . Depression   . GERD (gastroesophageal reflux disease)   . Headache(784.0)   . Hypertension   . Low back pain 08/15/2014  . Obesity   . Ulcer   . Urinary frequency 08/30/2014  . Vaginal discharge 08/15/2014  . Vaginal irritation 02/12/2014  . Yeast infection 02/12/2014    PAST SURGICAL HISTORY: Past Surgical History:  Procedure Laterality Date  . CHOLECYSTECTOMY  Feb 2015   Dr. Ladona Horns, [place]  . ENDOSCOPIC CONCHA BULLOSA RESECTION Left 07/20/2016   Procedure: ENDOSCOPIC LEFT  CONCHA BULLOSA RESECTION;  Surgeon: Leta Baptist, MD;  Location: [place];  Service: ENT;  Laterality: Left;  . ESOPHAGOGASTRODUODENOSCOPY N/A 06/11/2014   ACZ:YSAY DUODENITIS/MODERATE EROSIVE GASTRICTIS IN ANTRUM/PARTIALLY HEALED ULCERS  . ESOPHAGOGASTRODUODENOSCOPY N/A 05/06/2015   Procedure: ESOPHAGOGASTRODUODENOSCOPY (EGD);  Surgeon: Danie Binder, MD;  [facility];  Service: Endoscopy;  Laterality: N/A;  1300 - moved to 1:15 - office to notify  . ESOPHAGOGASTRODUODENOSCOPY (EGD) WITH ESOPHAGEAL DILATION N/A 02/11/2014   Dr. 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 EXCISION  CHONCHA BULLOSA N/A 07/20/2016   Procedure: NASAL SEPTOPLASTY WITH BILATERAL TURBINATE REDUCTION;  Surgeon: Leta Baptist, MD;  Location: [place];  Service: ENT;  Laterality: N/A;  . RHINOPLASTY    . SAVORY DILATION N/A 05/06/2015    Procedure: SAVORY DILATION;  Surgeon: Danie Binder, MD;  [facility];  Service: Endoscopy;  Laterality: N/A;  . TUBAL LIGATION      MEDICATIONS: Current Outpatient Medications on File Prior to Visit  Medication Sig Dispense Refill  . chlorthalidone (HYGROTON) 25 MG tablet Take 1 tablet (25 mg total) by mouth daily. 90 tablet 3  . dexlansoprazole (DEXILANT) 60 MG capsule Take 1 capsule (60 mg total) by mouth daily before breakfast. 30 capsule 11  . DULoxetine (CYMBALTA) 30 MG capsule Take 30 mg by mouth 2 (two) times daily. 2 in am and one at night    . methylphenidate (RITALIN) 5 MG tablet Take 10 mg by mouth 3 (three) times daily with meals.     . SUMAtriptan (IMITREX) 6 MG/0.5ML SOLN injection Inject 6 mg into the skin every 2 (two) hours as needed for migraine or headache. May repeat in 2 hours if headache persists or recurs.    . Vitamin D, Ergocalciferol, (DRISDOL) 50000 units CAPS capsule Take 50,000 Units by mouth every 7 (seven) days. For 12 weeks     No current facility-administered medications on file prior to visit.     ALLERGIES: Allergies  Allergen Reactions  . Macrobid [Nitrofurantoin Monohyd Macro] Itching  . Risperdal [Risperidone] Other (See Comments)    Wt gain    FAMILY HISTORY: Family History  Problem Relation Age of Onset  . Depression Father   . Anxiety disorder Father   . Diabetes Father   . Hypertension Father   . Hyperlipidemia Father   . Kidney disease Father   . Mental illness Father   . Depression Sister   . Anxiety disorder Sister   . Cancer Sister        cervical  . Depression Brother   . Anxiety disorder Brother   . Cancer - Other Paternal Grandfather           . Cancer Paternal Grandfather        prostate  . Kidney disease Mother   . Diabetes Mother   . Cancer Mother        kidney  . Hyperlipidemia Mother   . Hypertension Mother   . Aneurysm Maternal Grandfather   . Cancer Maternal Grandmother        breast  . Cancer  Paternal Grandmother        bladder  . ADD / ADHD Neg Hx   . Alcohol abuse Neg Hx   . Drug abuse Neg Hx   . Bipolar disorder Neg Hx   . Dementia Neg Hx   . OCD Neg Hx   . Paranoid behavior Neg Hx   . Schizophrenia Neg Hx   . Seizures Neg Hx   . Sexual abuse Neg Hx   . Physical abuse Neg Hx   . Colon cancer Neg Hx     SOCIAL HISTORY: Social History   Socioeconomic History  . Marital status: Married    Spouse name: Nathaneil Canary  . Number of children: 2  . Years of education: 69  . Highest education level: Not on file  Social Needs  . Financial resource strain: Not on file  . Food insecurity - worry: Not on file  . Food insecurity -  inability: Not on file  . Transportation needs - medical: Not on file  . Transportation needs - non-medical: Not on file  Occupational History  . Occupation: homehealth  Tobacco Use  . Smoking status: Never Smoker  . Smokeless tobacco: Never Used  Substance and Sexual Activity  . Alcohol use: No    Alcohol/week: 0.0 oz  . Drug use: No  . Sexual activity: Yes    Birth control/protection: Surgical    Comment: tubal  Other Topics Concern  . 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[employer]   Lives with husband and 2 children   One cat   Active in church    REVIEW OF SYSTEMS: Constitutional: Fatigue Eyes: No visual changes, double vision, eye pain Ear, nose and throat: No hearing loss, ear pain, nasal congestion, sore throat Cardiovascular: No chest pain, palpitations Respiratory:  No shortness of breath at rest or with exertion, wheezes GastrointestinaI: No nausea, vomiting, diarrhea, abdominal pain, fecal incontinence Genitourinary:  No dysuria, urinary retention or frequency Musculoskeletal:  Neck pain, back pain Integumentary: rash Neurological: as above Psychiatric: anxiety Endocrine: No palpitations, fatigue, diaphoresis, mood swings, change in appetite, change in weight, increased  thirst Hematologic/Lymphatic:  No purpura, petechiae. Allergic/Immunologic: no itchy/runny eyes, nasal congestion, recent allergic reactions, rashes  PHYSICAL EXAM: Vitals:   01/03/18 1223  BP: 124/70  Pulse: 90  SpO2: 97%   General: No acute distress.   Head:  Normocephalic/atraumatic Eyes:  fundi examined but not visualized Neck: supple, no paraspinal tenderness, full range of motion Back: No paraspinal tenderness Heart: regular rate and rhythm Lungs: Clear to auscultation bilaterally. Vascular: No carotid bruits. Neurological Exam: Mental status: alert and oriented to person, place, and time, recent and remote memory intact, fund of knowledge intact, attention and concentration intact, speech fluent and not dysarthric, language intact. Cranial nerves: CN I: not tested CN II: pupils equal, round and reactive to light, visual fields intact CN III, IV, VI:  full range of motion, no nystagmus, no ptosis CN V: facial sensation intact CN VII: upper and lower face symmetric CN VIII: hearing intact CN IX, X: gag intact, uvula midline CN XI: sternocleidomastoid and trapezius muscles intact CN XII: tongue midline Bulk & Tone: normal, no fasciculations. Motor:  5/5 throughout Sensation:  Pinprick and vibration sensation intact. Deep Tendon Reflexes:  2+ throughout, toes downgoing.  Finger to nose testing:  Without dysmetria.  Heel to shin:  Without dysmetria.  Gait:  Normal station and stride.  Able to turn and tandem walk. Romberg negative.  IMPRESSION: Nonspecific headache, polyarthralgias, fatigue.  Symptoms are more consistent with some underlying systemic disorder, such as an autoimmune disorder.  It is not consistent with a neurologic disease.  MRI rules out multiple sclerosis.  She endorses muscle weakness but strength is intact.  Her headaches and eye pain are nonspecific and not consistent with migraine.  She is more concerned about the cause of her symptoms rather than  treatment of her headache and eye pain.  PLAN: I will check a B12 and CK level.  If B12 is low, then recommend supplementation.  If CK is significantly elevated, then will proceed with NCV-EMG to evaluate for a myositis.  Otherwise, I do not suspect a neurologic etiology.  Thank you for allowing me to take part in the care of this patient.  Metta Clines, DO  CC:  Leticia Clas, OD  Blanchie Serve, MD

## 2018-01-04 ENCOUNTER — Telehealth: Payer: Self-pay | Admitting: *Deleted

## 2018-01-04 LAB — VITAMIN B12: VITAMIN B 12: 232 pg/mL (ref 200–1100)

## 2018-01-04 LAB — CK: Total CK: 76 U/L (ref 29–143)

## 2018-01-04 NOTE — Telephone Encounter (Signed)
Attempted to contact patient.  No answer and no voicemail. 

## 2018-01-04 NOTE — Telephone Encounter (Signed)
Patient given result and instructions.

## 2018-01-04 NOTE — Telephone Encounter (Signed)
-----   Message from Pieter Partridge, DO sent at 01/04/2018  7:36 AM EST ----- Labs show no evidence of muscle disease.  B12 is in the low-normal range, which may still cause symptoms of fatigue.  Consider taking over the counter B12 10108mcg daily.

## 2018-01-04 NOTE — Telephone Encounter (Signed)
-----   Message from Pieter Partridge, DO sent at 01/04/2018  7:36 AM EST ----- Labs show no evidence of muscle disease.  B12 is in the low-normal range, which may still cause symptoms of fatigue.  Consider taking over the counter B12 1029mcg daily.

## 2018-01-13 ENCOUNTER — Ambulatory Visit: Payer: Self-pay | Admitting: Family Medicine

## 2018-01-16 ENCOUNTER — Encounter: Payer: Self-pay | Admitting: Family Medicine

## 2018-01-23 ENCOUNTER — Ambulatory Visit: Payer: Self-pay | Admitting: Family Medicine

## 2018-02-12 DIAGNOSIS — J020 Streptococcal pharyngitis: Secondary | ICD-10-CM | POA: Diagnosis not present

## 2018-02-12 DIAGNOSIS — Z79899 Other long term (current) drug therapy: Secondary | ICD-10-CM | POA: Diagnosis not present

## 2018-02-12 DIAGNOSIS — F419 Anxiety disorder, unspecified: Secondary | ICD-10-CM | POA: Diagnosis not present

## 2018-02-12 DIAGNOSIS — F329 Major depressive disorder, single episode, unspecified: Secondary | ICD-10-CM | POA: Diagnosis not present

## 2018-03-03 DIAGNOSIS — F419 Anxiety disorder, unspecified: Secondary | ICD-10-CM | POA: Diagnosis not present

## 2018-03-03 NOTE — Congregational Nurse Program (Signed)
Congregational Nurse Program Note  Date of Encounter: 03/03/2018  Past Medical History: Past Medical History:  Diagnosis Date  . Allergy    medicine  . Anemia   . Anxiety   . Bipolar disorder ([place])   . BV (bacterial vaginosis) 08/15/2014  . CSF leak   . Depression   . GERD (gastroesophageal reflux disease)   . Headache(784.0)   . Hypertension   . Low back pain 08/15/2014  . Obesity   . Ulcer   . Urinary frequency 08/30/2014  . Vaginal discharge 08/15/2014  . Vaginal irritation 02/12/2014  . Yeast infection 02/12/2014    Encounter Details: CNP Questionnaire - 03/01/18 1130      Questionnaire   Patient Status  Not Applicable    Race  White or Caucasian    Location Patient Served At  [place], [place]  Yes, have food insecurities    Housing/Utilities  Yes, have permanent housing    Transportation  No transportation needs    Interpersonal Safety  Yes, feel physically and emotionally safe where you currently live    Medication  No medication insecurities    Medical Provider  Yes    Referrals  Not Applicable    ED Visit Averted  Not Applicable    Life-Saving Intervention Made  Not Applicable     B/P taken 118/78; P Casey,  [place] [employer] [phone]

## 2018-03-08 ENCOUNTER — Encounter: Payer: Self-pay | Admitting: Gastroenterology

## 2018-03-13 DIAGNOSIS — J020 Streptococcal pharyngitis: Secondary | ICD-10-CM | POA: Diagnosis not present

## 2018-03-13 DIAGNOSIS — F419 Anxiety disorder, unspecified: Secondary | ICD-10-CM | POA: Diagnosis not present

## 2018-03-13 DIAGNOSIS — F329 Major depressive disorder, single episode, unspecified: Secondary | ICD-10-CM | POA: Diagnosis not present

## 2018-03-13 DIAGNOSIS — Z79899 Other long term (current) drug therapy: Secondary | ICD-10-CM | POA: Diagnosis not present

## 2018-03-21 ENCOUNTER — Other Ambulatory Visit: Payer: Self-pay

## 2018-03-21 ENCOUNTER — Emergency Department (HOSPITAL_COMMUNITY): Payer: Medicare Other

## 2018-03-21 ENCOUNTER — Emergency Department (HOSPITAL_COMMUNITY)
Admission: EM | Admit: 2018-03-21 | Discharge: 2018-03-21 | Disposition: A | Discharge status: Home / Self Care | Payer: Medicare Other | Attending: Emergency Medicine | Admitting: Emergency Medicine

## 2018-03-21 ENCOUNTER — Encounter (HOSPITAL_COMMUNITY): Payer: Self-pay | Admitting: Adult Health

## 2018-03-21 DIAGNOSIS — R0789 Other chest pain: Secondary | ICD-10-CM | POA: Diagnosis not present

## 2018-03-21 DIAGNOSIS — R079 Chest pain, unspecified: Secondary | ICD-10-CM | POA: Diagnosis not present

## 2018-03-21 DIAGNOSIS — I10 Essential (primary) hypertension: Secondary | ICD-10-CM | POA: Insufficient documentation

## 2018-03-21 DIAGNOSIS — R5383 Other fatigue: Secondary | ICD-10-CM | POA: Diagnosis not present

## 2018-03-21 DIAGNOSIS — J020 Streptococcal pharyngitis: Secondary | ICD-10-CM | POA: Insufficient documentation

## 2018-03-21 DIAGNOSIS — R070 Pain in throat: Secondary | ICD-10-CM | POA: Diagnosis present

## 2018-03-21 DIAGNOSIS — Z79899 Other long term (current) drug therapy: Secondary | ICD-10-CM | POA: Insufficient documentation

## 2018-03-21 MED ORDER — DEXAMETHASONE 4 MG PO TABS: 4.0000 mg | ORAL_TABLET | Freq: Two times a day (BID) | ORAL | 0 refills | Status: DC

## 2018-03-21 NOTE — ED Provider Notes (Signed)
[facility] EMERGENCY DEPARTMENT Provider Note   CSN: [id number] Arrival date & time: 03/21/18  1336     History   Chief Complaint Chief Complaint  Patient presents with  . Sore Throat    HPI Teresa Tran is a 37 y.o. female.  Patient is a 37 year old female who presents to the emergency department with complaint of sore throat and upper chest discomfort.  The patient states that approximately 3 or 4 weeks ago she was diagnosed with strep infection.  She was treated with Bicillin.  Approximately 2 weeks later she was diagnosed again with a strep infection.  This time she was placed on oral medication.  She has not finished medication yet, but does not feel as though she is getting better.  She says she does not feel well, and she has a discomfort in her chest that is worse with cough.  She is requesting to have a chest x-ray and to be rechecked.  There is been no new problems with chills or fever.  No hemoptysis reported.  No other symptoms at this time.  The history is provided by the patient.    Past Medical History:  Diagnosis Date  . Allergy    medicine  . Anemia   . Anxiety   . Bipolar disorder ([place])   . BV (bacterial vaginosis) 08/15/2014  . CSF leak   . Depression   . GERD (gastroesophageal reflux disease)   . Headache(784.0)   . Hypertension   . Low back pain 08/15/2014  . Obesity   . Ulcer   . Urinary frequency 08/30/2014  . Vaginal discharge 08/15/2014  . Vaginal irritation 02/12/2014  . Yeast infection 02/12/2014    Patient Active Problem List   Diagnosis Date Noted  . Dyspepsia 12/21/2017  . Liver lesion 09/07/2017  . Proteinuria 09/07/2017  . Essential hypertension 09/01/2017  . Encounter for well woman exam with routine gynecological exam 07/20/2017  . Migraine 06/21/2017  . Chronic fatigue 06/21/2017  . Morbid obesity ([place]) 06/21/2017  . GERD (gastroesophageal reflux disease) 12/22/2016  . Uterine leiomyoma 12/02/2016  . Menorrhagia with irregular  cycle 12/02/2016  . Irritable larynx syndrome 12/29/2015  . Normocytic anemia 09/11/2014  . Low back pain 08/15/2014  . PUD (peptic ulcer disease) 06/06/2014  . Unspecified vitamin D deficiency 02/19/2013  . Bipolar 1 disorder ([place]) 03/23/2012  . PTSD (post-traumatic stress disorder) 03/23/2012    Past Surgical History:  Procedure Laterality Date  . CHOLECYSTECTOMY  Feb 2015   Dr. Ladona Horns, [place]  . ENDOSCOPIC CONCHA BULLOSA RESECTION Left 07/20/2016   Procedure: ENDOSCOPIC LEFT  CONCHA BULLOSA RESECTION;  Surgeon: Leta Baptist, MD;  Location: [place];  Service: ENT;  Laterality: Left;  . ESOPHAGOGASTRODUODENOSCOPY N/A 06/11/2014   JXB:JYNW DUODENITIS/MODERATE EROSIVE GASTRICTIS IN ANTRUM/PARTIALLY HEALED ULCERS  . ESOPHAGOGASTRODUODENOSCOPY N/A 05/06/2015   Procedure: ESOPHAGOGASTRODUODENOSCOPY (EGD);  Surgeon: Danie Binder, MD;  [facility];  Service: Endoscopy;  Laterality: N/A;  1300 - moved to 1:15 - office to notify  . ESOPHAGOGASTRODUODENOSCOPY (EGD) WITH ESOPHAGEAL DILATION N/A 02/11/2014   Dr. 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 EXCISION CHONCHA BULLOSA N/A 07/20/2016   Procedure: NASAL SEPTOPLASTY WITH BILATERAL TURBINATE REDUCTION;  Surgeon: Leta Baptist, MD;  Location: [place];  Service: ENT;  Laterality: N/A;  . RHINOPLASTY    . SAVORY DILATION N/A 05/06/2015   Procedure: SAVORY DILATION;  Surgeon: Danie Binder, MD;  [facility];  Service: Endoscopy;  Laterality: N/A;  . TUBAL LIGATION       OB History    Gravida  2   Para  2   Term  1   Preterm  1   AB      Living  2     SAB      TAB      Ectopic      Multiple      Live Births  2            Home Medications    Prior to Admission medications   Medication Sig Start Date End Date Taking? Authorizing Provider  chlorthalidone (HYGROTON) 25 MG tablet Take 1 tablet (25 mg total) by mouth daily. 09/01/17    Raylene Everts, MD  dexlansoprazole [facility]) 60 MG capsule Take 1 capsule (60 mg total) by mouth daily before breakfast. 09/07/17   Mahala Menghini, PA-C  DULoxetine (CYMBALTA) 30 MG capsule Take 30 mg by mouth 2 (two) times daily. 2 in am and one at night 05/30/17   [provider]  methylphenidate (RITALIN) 5 MG tablet Take 10 mg by mouth 3 (three) times daily with meals.  05/19/17   [provider]  SUMAtriptan (IMITREX) 6 MG/0.5ML SOLN injection Inject 6 mg into the skin every 2 (two) hours as needed for migraine or headache. May repeat in 2 hours if headache persists or recurs.    [provider]  Vitamin D, Ergocalciferol, (DRISDOL) 50000 units CAPS capsule Take 50,000 Units by mouth every 7 (seven) days. For 12 weeks    [provider]    Family History Family History  Problem Relation Age of Onset  . Depression Father   . Anxiety disorder Father   . Diabetes Father   . Hypertension Father   . Hyperlipidemia Father   . Kidney disease Father   . Mental illness Father   . Depression Sister   . Anxiety disorder Sister   . Cancer Sister        cervical  . Depression Brother   . Anxiety disorder Brother   . Cancer - Other Paternal Grandfather           . Cancer Paternal Grandfather        prostate  . Kidney disease Mother   . Diabetes Mother   . Cancer Mother        kidney  . Hyperlipidemia Mother   . Hypertension Mother   . Aneurysm Maternal Grandfather   . Cancer Maternal Grandmother        breast  . Cancer Paternal Grandmother        bladder  . ADD / ADHD Neg Hx   . Alcohol abuse Neg Hx   . Drug abuse Neg Hx   . Bipolar disorder Neg Hx   . Dementia Neg Hx   . OCD Neg Hx   . Paranoid behavior Neg Hx   . Schizophrenia Neg Hx   . Seizures Neg Hx   . Sexual abuse Neg Hx   . Physical abuse Neg Hx   . Colon cancer Neg Hx     Social History Social History   Tobacco Use  . Smoking status: Never Smoker  . Smokeless tobacco:  Never Used  Substance Use Topics  . Alcohol use: No    Alcohol/week: 0.0 oz  . Drug use: No     Allergies   Macrobid [nitrofurantoin monohyd macro] and Risperdal [risperidone]   Review of Systems  Review of Systems  Constitutional: Negative for activity change.       All ROS Neg except as noted in HPI  HENT: Positive for sore throat. Negative for nosebleeds.   Eyes: Negative for photophobia and discharge.  Respiratory: Negative for cough, shortness of breath and wheezing.        Chest discomfort  Cardiovascular: Negative for chest pain and palpitations.  Gastrointestinal: Negative for abdominal pain and blood in stool.  Genitourinary: Negative for dysuria, frequency and hematuria.  Musculoskeletal: Negative for arthralgias, back pain and neck pain.  Skin: Negative.   Neurological: Negative for dizziness, seizures and speech difficulty.  Psychiatric/Behavioral: Negative for confusion and hallucinations.     Physical Exam Updated Vital Signs BP 126/70 (BP Location: Right Arm)   Pulse 96   Temp 98.2 F (36.8 C) (Oral)   Resp 12   Ht 5' (1.524 m)   Wt 90.7 kg (200 lb)   LMP 02/20/2018 (Approximate)   SpO2 98%   BMI 39.06 kg/m   Physical Exam  Constitutional: She is oriented to person, place, and time. She appears well-developed and well-nourished.  Non-toxic appearance.  HENT:  Head: Normocephalic.  Right Ear: Tympanic membrane and external ear normal.  Left Ear: Tympanic membrane and external ear normal.  There is mild increased redness of the posterior pharynx.  Uvula is minimally enlarged. Airway is patent.  Eyes: Pupils are equal, round, and reactive to light. EOM and lids are normal.  Neck: Normal range of motion. Neck supple. Carotid bruit is not present.  Cardiovascular: Normal rate, regular rhythm, normal heart sounds, intact distal pulses and normal pulses. Exam reveals no gallop and no friction rub.  No murmur heard. Pulmonary/Chest: Effort normal and  breath sounds normal. No stridor. No respiratory distress. She has no wheezes. She has no rales.  Abdominal: Soft. Bowel sounds are normal. There is no tenderness. There is no guarding.  Musculoskeletal: Normal range of motion.  Lymphadenopathy:       Head (right side): No submandibular adenopathy present.       Head (left side): No submandibular adenopathy present.    She has no cervical adenopathy.  Neurological: She is alert and oriented to person, place, and time. She has normal strength. No cranial nerve deficit or sensory deficit.  Skin: Skin is warm and dry. No rash noted.  Psychiatric: She has a normal mood and affect. Her speech is normal.  Nursing note and vitals reviewed.    ED Treatments / Results  Labs (all labs ordered are listed, but only abnormal results are displayed) Labs Reviewed - No data to display  EKG None  Radiology Dg Chest 2 View  Result Date: 03/21/2018 CLINICAL DATA:  Chest pain and fatigue EXAM: CHEST - 2 VIEW COMPARISON:  August 22, 2017 FINDINGS: Lungs are clear. Heart size and pulmonary vascularity are normal. No adenopathy. No pneumothorax. No bone lesions. IMPRESSION: No edema or consolidation. Electronically Signed   By: Lowella Grip III M.D.   On: 03/21/2018 14:29    Procedures Procedures (including critical care time)  Medications Ordered in ED Medications - No data to display   Initial Impression / Assessment and Plan / ED Course  I have reviewed the triage vital signs and the nursing notes.  Pertinent labs & imaging results that were available during my care of the patient were reviewed by me and considered in my medical decision making (see chart for details).       Final Clinical Impressions(s) / ED  Diagnoses MDM  Vital signs within normal limits.  Pulse oximetry is 98% on room air.  Within normal limits by my interpretation.  The airway is patent.  There is minimal redness of the posterior pharynx and minimal enlargement  of the uvula.  No evidence of any abscess.  The patient has symmetrical rise and fall of the chest and speaks in complete sentences.  Chest x-ray is read is negative.  I have explained to the patient that the discomfort she is having in her chest is probably residual of the strep infection.  I have asked patient to finish her oral antibiotic, and to add a short course of steroid.  Patient is in agreement with this plan.   Final diagnoses:  Strep pharyngitis    ED Discharge Orders        Ordered    dexamethasone (DECADRON) 4 MG tablet  2 times daily with meals     03/21/18 1538       Lily Kocher, PA-C 03/21/18 1547    [place] Ferguson, MD 03/22/18 1545

## 2018-03-21 NOTE — ED Notes (Signed)
ED Provider at bedside. 

## 2018-03-21 NOTE — ED Triage Notes (Addendum)
Presents with DX of strep throat from 3 weeks ago, she was given a shot of PCN and the symptoms did not resolve. 6 days ago she went back to the doctor and was DX with strep then and given antibiotics to take for a week. She has been taking them for six days and she is feeling worse with pain with inspiration and fatigue. She is concerned that she is getting much sicker. She reports intermittent fevers. She is requesting lab work and a chest x ray

## 2018-03-21 NOTE — ED Notes (Signed)
Patient transported to X-ray 

## 2018-03-21 NOTE — Discharge Instructions (Addendum)
Your vital signs within normal limits.  Your oxygen level is 98% on room air.  Within normal limits by my interpretation.  The chest x-ray is completely normal.  I suspect that your discomfort is a result of the residual strep infection.  Please finish your antibiotics.  Please add Decadron 2 times daily with food.  Use Tylenol every 4 hours, or ibuprofen every 6 hours for discomfort, and/or fever.  Please wash hands frequently.

## 2018-04-14 ENCOUNTER — Encounter

## 2018-04-14 ENCOUNTER — Ambulatory Visit: Payer: Self-pay | Admitting: Neurology

## 2018-05-09 DIAGNOSIS — R509 Fever, unspecified: Secondary | ICD-10-CM | POA: Diagnosis not present

## 2018-05-09 DIAGNOSIS — F419 Anxiety disorder, unspecified: Secondary | ICD-10-CM | POA: Diagnosis not present

## 2018-05-09 DIAGNOSIS — Z8619 Personal history of other infectious and parasitic diseases: Secondary | ICD-10-CM | POA: Diagnosis not present

## 2018-05-09 DIAGNOSIS — R05 Cough: Secondary | ICD-10-CM | POA: Diagnosis not present

## 2018-05-09 DIAGNOSIS — Z79899 Other long term (current) drug therapy: Secondary | ICD-10-CM | POA: Diagnosis not present

## 2018-05-09 DIAGNOSIS — F329 Major depressive disorder, single episode, unspecified: Secondary | ICD-10-CM | POA: Diagnosis not present

## 2018-05-18 DIAGNOSIS — F419 Anxiety disorder, unspecified: Secondary | ICD-10-CM | POA: Diagnosis not present

## 2018-05-30 ENCOUNTER — Ambulatory Visit: Payer: Medicare Other | Admitting: Adult Health

## 2018-06-08 ENCOUNTER — Encounter: Payer: Self-pay | Admitting: Adult Health

## 2018-06-08 ENCOUNTER — Telehealth: Payer: Self-pay | Admitting: Neurology

## 2018-06-08 ENCOUNTER — Ambulatory Visit (INDEPENDENT_AMBULATORY_CARE_PROVIDER_SITE_OTHER): Payer: Medicare Other | Admitting: Adult Health

## 2018-06-08 VITALS — BP 132/94 | HR 77 | Ht 60.0 in | Wt 225.0 lb

## 2018-06-08 DIAGNOSIS — N9089 Other specified noninflammatory disorders of vulva and perineum: Secondary | ICD-10-CM | POA: Diagnosis not present

## 2018-06-08 NOTE — Progress Notes (Signed)
  Subjective:     Patient ID: Teresa Tran, female   DOB: [birth year], 37 y.o.   MRN: [id number]  HPI Kynisha is a 37 year old white female in complaining of growth right labia that seems to be growing in last 2 months.  Review of Systems Growth right labia  Reviewed past medical,surgical, social and family history. Reviewed medications and allergies.     Objective:   Physical Exam BP (!) 132/94 (BP Location: Right Arm, Patient Position: Sitting, Cuff Size: Normal)   Pulse 77   Ht 5' (1.524 m)   Wt 225 lb (102.1 kg)   LMP 05/26/2018   BMI 43.94 kg/m   Skin warm and dry, has 1.5 cm skin tag right labia     Assessment:     Right labial skin tag    Plan:     Return in 2 weeks for labial skin tag removal

## 2018-06-08 NOTE — Telephone Encounter (Signed)
Zack with [facility] called and is needing this patients lab work results faxed to them at [phone]. Thanks

## 2018-06-09 DIAGNOSIS — H5712 Ocular pain, left eye: Secondary | ICD-10-CM | POA: Diagnosis not present

## 2018-06-09 DIAGNOSIS — H04123 Dry eye syndrome of bilateral lacrimal glands: Secondary | ICD-10-CM | POA: Diagnosis not present

## 2018-06-12 ENCOUNTER — Ambulatory Visit (INDEPENDENT_AMBULATORY_CARE_PROVIDER_SITE_OTHER): Payer: Medicare Other | Admitting: Family Medicine

## 2018-06-12 ENCOUNTER — Encounter: Payer: Self-pay | Admitting: Family Medicine

## 2018-06-12 VITALS — BP 119/80 | HR 89 | Temp 97.3°F | Ht 63.0 in | Wt 223.8 lb

## 2018-06-12 DIAGNOSIS — R7303 Prediabetes: Secondary | ICD-10-CM | POA: Diagnosis not present

## 2018-06-12 DIAGNOSIS — I10 Essential (primary) hypertension: Secondary | ICD-10-CM | POA: Diagnosis not present

## 2018-06-12 DIAGNOSIS — K219 Gastro-esophageal reflux disease without esophagitis: Secondary | ICD-10-CM

## 2018-06-12 DIAGNOSIS — E559 Vitamin D deficiency, unspecified: Secondary | ICD-10-CM | POA: Diagnosis not present

## 2018-06-12 DIAGNOSIS — R5382 Chronic fatigue, unspecified: Secondary | ICD-10-CM | POA: Diagnosis not present

## 2018-06-12 LAB — BAYER DCA HB A1C WAIVED: HB A1C: 5.8 % (ref <7.0)

## 2018-06-12 MED ORDER — CHLORTHALIDONE 25 MG PO TABS: 25.0000 mg | ORAL_TABLET | Freq: Every day | ORAL | 3 refills | Status: DC

## 2018-06-12 MED ORDER — VITAMIN D (ERGOCALCIFEROL) 1.25 MG (50000 UNIT) PO CAPS: 50000.0000 [IU] | ORAL_CAPSULE | ORAL | 1 refills | Status: DC

## 2018-06-12 NOTE — Progress Notes (Signed)
 BP 119/80   Pulse 89   Temp (!) 97.3 F (36.3 C) (Oral)   Ht 5' 3" (1.6 m)   Wt 223 lb 12.8 oz (101.5 kg)   LMP 05/26/2018   BMI 39.64 kg/m    Subjective:    Patient ID: Teresa Tran, female    DOB: 07/26/1981, 36 y.o.   MRN: [id number]  HPI: Teresa Tran is a 36 y.o. 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 C/o joint pain and strenght loss. 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 Patient denies any lightheadedness or dizziness. Patient denies headaches, blurred vision, chest pains, shortness of breath, or weakness. Denies any side effects from medication and is content with current medication.   Prediabetes Patient comes in today for recheck of his diabetes. Patient has been currently taking no medication but are monitoring, her last A1c per her was 5.9  1-year ago. Patient is not currently on an ACE inhibitor/ARB. Patient has not seen an ophthalmologist this year. Patient denies any issues with their feet.   Fatigue and myalgias and arthralgias Patient has known issues with fatigue and myalgias and arthralgias and is working with a rheumatologist for this who she sees currently to help diagnose what is going on.  She also has a psychiatrist that she works with who is prescribing Rexulti and Cymbalta and Ritalin and she feels like she is doing okay on these medications wants to do some blood work today to see if there is any other possible causes.  She denies any suicidal ideations or thoughts of hurting herself.  She mainly complains of just not having energy and just generalized achiness.  She thinks that she may have chronic Lyme disease and they are looking into this as  well.  GERD Patient is currently on Dexilant, she does well while she is on it.  She denies any major symptoms or abdominal pain or belching or burping. She denies any blood in her stool or lightheadedness or dizziness.   Relevant past medical, surgical, family and social history reviewed and updated as indicated. Interim medical history since our last visit reviewed. Allergies and medications reviewed and updated.  Review of Systems  Constitutional: Positive for fatigue. Negative for chills and fever.  HENT: Negative for congestion, ear discharge and ear pain.   Eyes: Negative for redness and visual disturbance.  Respiratory: Negative for chest tightness and shortness of breath.   Cardiovascular: Negative for chest pain and leg swelling.  Gastrointestinal: Negative for abdominal distention, abdominal pain, blood in stool, constipation, diarrhea, nausea and vomiting.  Genitourinary: Negative for difficulty urinating and dysuria.  Musculoskeletal: Positive for arthralgias and myalgias. Negative for gait problem.  Skin: Negative for rash.  Neurological: Negative for light-headedness and headaches.  Psychiatric/Behavioral: Negative for agitation and behavioral problems.  All other systems reviewed and are negative.   Per HPI unless specifically indicated above  Social History   Socioeconomic History  . Marital status: Married    Spouse name: Douglas  . Number of children: 2  . Years of education: 16  . Highest education level: Not on file  Occupational History  . Occupation: homehealth  Social Needs  . Financial resource strain: Not on   file  . Food insecurity:    Worry: Not on file    Inability: Not on file  . Transportation needs:    Medical: Not on file    Non-medical: Not on file  Tobacco Use  . Smoking status: Never Smoker  . Smokeless tobacco: Never Used  Substance and Sexual Activity  . Alcohol use: No    Alcohol/week: 0.0 oz  . Drug use: No  . Sexual activity:  Yes    Birth control/protection: Surgical    Comment: tubal  Lifestyle  . Physical activity:    Days per week: Not on file    Minutes per session: Not on file  . Stress: Not on file  Relationships  . Social connections:    Talks on phone: Not on file    Gets together: Not on file    Attends religious service: Not on file    Active member of club or organization: Not on file    Attends meetings of clubs or organizations: Not on file    Relationship status: Not on file  . Intimate partner violence:    Fear of current or ex partner: Not on file    Emotionally abused: Not on file    Physically abused: Not on file    Forced sexual activity: Not on file  Other Topics Concern  . Not on file  Social History Narrative   Working on masters in pastoral counsleling   Lives with husband and 2 children   One cat   Active in church    Past Surgical History:  Procedure Laterality Date  . CHOLECYSTECTOMY  Feb 2015   Dr. Cathey, [place]  . ENDOSCOPIC CONCHA BULLOSA RESECTION Left 07/20/2016   Procedure: ENDOSCOPIC LEFT  CONCHA BULLOSA RESECTION;  Surgeon: Su Teoh, MD;  Location: [facility];  Service: ENT;  Laterality: Left;  . ESOPHAGOGASTRODUODENOSCOPY N/A 06/11/2014   SLF:MILD DUODENITIS/MODERATE EROSIVE GASTRICTIS IN ANTRUM/PARTIALLY HEALED ULCERS  . ESOPHAGOGASTRODUODENOSCOPY N/A 05/06/2015   Procedure: ESOPHAGOGASTRODUODENOSCOPY (EGD);  Surgeon: Sandi L Fields, MD;  [facility];  Service: Endoscopy;  Laterality: N/A;  1300 - moved to 1:15 - office to notify  . ESOPHAGOGASTRODUODENOSCOPY (EGD) WITH ESOPHAGEAL DILATION N/A 02/11/2014   Dr. Fields: probable proximal esophageal web, PUD, duodenitis, negative H.pylori   . EXCISION CHONCHA BULLOSA N/A 07/20/2016   Procedure: NASAL SEPTOPLASTY WITH BILATERAL TURBINATE REDUCTION;  Surgeon: Su Teoh, MD;  Location: [facility];  Service: ENT;  Laterality: N/A;  . RHINOPLASTY    . SAVORY DILATION N/A 05/06/2015    Procedure: SAVORY DILATION;  Surgeon: Sandi L Fields, MD;  [facility];  Service: Endoscopy;  Laterality: N/A;  . TUBAL LIGATION      Family History  Problem Relation Age of Onset  . Depression Father   . Anxiety disorder Father   . Diabetes Father   . Hypertension Father   . Hyperlipidemia Father   . Kidney disease Father   . Mental illness Father   . Pneumonia Father   . Stroke Father   . Depression Sister   . Anxiety disorder Sister   . Cancer Sister        cervical  . Stroke Sister   . Depression Brother   . Anxiety disorder Brother   . Cancer - Other Paternal Grandfather           . Cancer Paternal Grandfather        prostate  . Kidney disease Mother   .   Diabetes Mother   . Cancer Mother        kidney  . Hyperlipidemia Mother   . Hypertension Mother   . Stroke Mother   . Aneurysm Maternal Grandfather   . Cancer Maternal Grandmother 50       breast  . Cancer Paternal Grandmother        bladder  . ADD / ADHD Neg Hx   . Alcohol abuse Neg Hx   . Drug abuse Neg Hx   . Bipolar disorder Neg Hx   . Dementia Neg Hx   . OCD Neg Hx   . Paranoid behavior Neg Hx   . Schizophrenia Neg Hx   . Seizures Neg Hx   . Sexual abuse Neg Hx   . Physical abuse Neg Hx   . Colon cancer Neg Hx     Allergies as of 06/12/2018      Reactions   Macrobid [nitrofurantoin Monohyd Macro] Itching   Risperdal [risperidone] Other (See Comments)   Wt gain      Medication List        Accurate as of 06/12/18 10:18 AM. Always use your most recent med list.          chlorthalidone 25 MG tablet Commonly known as:  HYGROTON Take 1 tablet (25 mg total) by mouth daily.   dexlansoprazole 60 MG capsule Commonly known as:  DEXILANT Take 1 capsule (60 mg total) by mouth daily before breakfast.   DULoxetine 60 MG capsule Commonly known as:  CYMBALTA Take 60 mg by mouth daily.   methylphenidate 10 MG tablet Commonly known as:  RITALIN Take 1 tablet by mouth 3 (three) times  daily.   REXULTI 1 MG Tabs Generic drug:  Brexpiprazole Take by mouth.   Vitamin D (Ergocalciferol) 50000 units Caps capsule Commonly known as:  DRISDOL Take 1 capsule (50,000 Units total) by mouth every 7 (seven) days. For 12 weeks          Objective:    BP 119/80   Pulse 89   Temp (!) 97.3 F (36.3 C) (Oral)   Ht 5' 3" (1.6 m)   Wt 223 lb 12.8 oz (101.5 kg)   LMP 05/26/2018   BMI 39.64 kg/m   Wt Readings from Last 3 Encounters:  06/12/18 223 lb 12.8 oz (101.5 kg)  06/08/18 225 lb (102.1 kg)  03/21/18 200 lb (90.7 kg)    Physical Exam  Constitutional: She is oriented to person, place, and time. She appears well-developed and well-nourished. No distress.  Eyes: Conjunctivae are normal.  Neck: Neck supple. No thyromegaly present.  Cardiovascular: Normal rate, regular rhythm, normal heart sounds and intact distal pulses.  No murmur heard. Pulmonary/Chest: Effort normal and breath sounds normal. No respiratory distress. She has no wheezes.  Abdominal: Soft. Bowel sounds are normal. She exhibits no distension. There is no tenderness. There is no guarding.  Musculoskeletal: Normal range of motion. She exhibits no edema or tenderness.  Lymphadenopathy:    She has no cervical adenopathy.  Neurological: She is alert and oriented to person, place, and time. Coordination normal.  Skin: Skin is warm and dry. No rash noted. She is not diaphoretic.  Psychiatric: She has a normal mood and affect. Her behavior is normal.  Nursing note and vitals reviewed.       Assessment & Plan:   Problem List Items Addressed This Visit      Cardiovascular and Mediastinum   Essential hypertension   Relevant Medications   chlorthalidone (  HYGROTON) 25 MG tablet   Other Relevant Orders   Microalbumin / creatinine urine ratio (Completed)   CMP14+EGFR (Completed)     Digestive   GERD (gastroesophageal reflux disease) - Primary   Relevant Orders   CBC with Differential/Platelet (Completed)      Other   Vitamin D deficiency (Chronic)   Relevant Orders   VITAMIN D 25 Hydroxy (Vit-D Deficiency, Fractures) (Completed)   Chronic fatigue   Relevant Orders   CBC with Differential/Platelet (Completed)   TSH (Completed)   VITAMIN D 25 Hydroxy (Vit-D Deficiency, Fractures) (Completed)   Morbid obesity (HCC)   Relevant Medications   methylphenidate (RITALIN) 10 MG tablet   Other Relevant Orders   Lipid panel (Completed)   Prediabetes   Relevant Orders   Bayer DCA Hb A1c Waived (Completed)       Follow up plan: Return in about 6 months (around 12/13/2018), or if symptoms worsen or fail to improve, for hypertension.  Joshua Dettinger, MD [facility] 06/12/2018, 10:18 AM      

## 2018-06-13 LAB — CBC WITH DIFFERENTIAL/PLATELET
BASOS ABS: 0 10*3/uL (ref 0.0–0.2)
Basos: 0 %
EOS (ABSOLUTE): 0.1 10*3/uL (ref 0.0–0.4)
Eos: 1 %
HEMOGLOBIN: 12.5 g/dL (ref 11.1–15.9)
Hematocrit: 37.8 % (ref 34.0–46.6)
IMMATURE GRANS (ABS): 0 10*3/uL (ref 0.0–0.1)
Immature Granulocytes: 0 %
LYMPHS ABS: 2 10*3/uL (ref 0.7–3.1)
Lymphs: 26 %
MCH: 27 pg (ref 26.6–33.0)
MCHC: 33.1 g/dL (ref 31.5–35.7)
MCV: 82 fL (ref 79–97)
MONOCYTES: 5 %
Monocytes Absolute: 0.4 10*3/uL (ref 0.1–0.9)
Neutrophils Absolute: 5.1 10*3/uL (ref 1.4–7.0)
Neutrophils: 68 %
Platelets: 273 10*3/uL (ref 150–450)
RBC: 4.63 x10E6/uL (ref 3.77–5.28)
RDW: 15.1 % (ref 12.3–15.4)
WBC: 7.5 10*3/uL (ref 3.4–10.8)

## 2018-06-13 LAB — VITAMIN D 25 HYDROXY (VIT D DEFICIENCY, FRACTURES): VIT D 25 HYDROXY: 17 ng/mL — AB (ref 30.0–100.0)

## 2018-06-13 LAB — CMP14+EGFR
ALBUMIN: 4.3 g/dL (ref 3.5–5.5)
ALT: 19 IU/L (ref 0–32)
AST: 19 IU/L (ref 0–40)
Albumin/Globulin Ratio: 1.5 (ref 1.2–2.2)
Alkaline Phosphatase: 78 IU/L (ref 39–117)
BUN / CREAT RATIO: 17 (ref 9–23)
BUN: 13 mg/dL (ref 6–20)
Bilirubin Total: 0.3 mg/dL (ref 0.0–1.2)
CO2: 26 mmol/L (ref 20–29)
CREATININE: 0.75 mg/dL (ref 0.57–1.00)
Calcium: 9.5 mg/dL (ref 8.7–10.2)
Chloride: 99 mmol/L (ref 96–106)
GFR calc non Af Amer: 104 mL/min/{1.73_m2} (ref >59)
GFR, EST AFRICAN AMERICAN: 119 mL/min/{1.73_m2} (ref >59)
GLUCOSE: 102 mg/dL — AB (ref 65–99)
Globulin, Total: 2.8 g/dL (ref 1.5–4.5)
Potassium: 3.5 mmol/L (ref 3.5–5.2)
Sodium: 142 mmol/L (ref 134–144)
TOTAL PROTEIN: 7.1 g/dL (ref 6.0–8.5)

## 2018-06-13 LAB — MICROALBUMIN / CREATININE URINE RATIO
CREATININE, UR: 188.7 mg/dL
MICROALB/CREAT RATIO: 3.5 mg/g{creat} (ref 0.0–30.0)
Microalbumin, Urine: 6.6 ug/mL

## 2018-06-13 LAB — LIPID PANEL
CHOLESTEROL TOTAL: 227 mg/dL — AB (ref 100–199)
Chol/HDL Ratio: 3.4 ratio (ref 0.0–4.4)
HDL: 66 mg/dL (ref >39)
LDL CALC: 137 mg/dL — AB (ref 0–99)
Triglycerides: 122 mg/dL (ref 0–149)
VLDL Cholesterol Cal: 24 mg/dL (ref 5–40)

## 2018-06-13 LAB — TSH: TSH: 1.57 u[IU]/mL (ref 0.450–4.500)

## 2018-06-22 ENCOUNTER — Ambulatory Visit: Payer: Medicare Other | Admitting: Adult Health

## 2018-07-06 ENCOUNTER — Ambulatory Visit: Payer: Medicare Other | Admitting: Adult Health

## 2018-07-24 ENCOUNTER — Other Ambulatory Visit: Payer: Medicare Other | Admitting: Adult Health

## 2018-07-24 ENCOUNTER — Encounter: Payer: Self-pay | Admitting: Adult Health

## 2018-07-24 ENCOUNTER — Telehealth: Payer: Self-pay | Admitting: *Deleted

## 2018-07-24 ENCOUNTER — Ambulatory Visit (INDEPENDENT_AMBULATORY_CARE_PROVIDER_SITE_OTHER): Payer: Medicare Other | Admitting: Adult Health

## 2018-07-24 VITALS — BP 112/71 | Ht 60.0 in | Wt 228.4 lb

## 2018-07-24 DIAGNOSIS — N39498 Other specified urinary incontinence: Secondary | ICD-10-CM

## 2018-07-24 DIAGNOSIS — R32 Unspecified urinary incontinence: Secondary | ICD-10-CM | POA: Insufficient documentation

## 2018-07-24 DIAGNOSIS — N39490 Overflow incontinence: Secondary | ICD-10-CM | POA: Insufficient documentation

## 2018-07-24 DIAGNOSIS — N9089 Other specified noninflammatory disorders of vulva and perineum: Secondary | ICD-10-CM

## 2018-07-24 DIAGNOSIS — N766 Ulceration of vulva: Secondary | ICD-10-CM | POA: Diagnosis not present

## 2018-07-24 LAB — POCT URINALYSIS DIPSTICK
Blood, UA: NEGATIVE
LEUKOCYTES UA: NEGATIVE
NITRITE UA: NEGATIVE
PROTEIN UA: NEGATIVE

## 2018-07-24 MED ORDER — MIRABEGRON ER 25 MG PO TB24: 25.0000 mg | ORAL_TABLET | Freq: Every day | ORAL | 0 refills | Status: DC

## 2018-07-24 NOTE — Progress Notes (Addendum)
  Subjective:     Patient ID: Teresa Tran, female   DOB: [birth year], 37 y.o.   MRN: [id number]  HPI Teresa Tran is a 37 year old white female in for skin tag removal right labia, and complains of urine leakage, and being tired.   Review of Systems urinary incontinence, leaks often +feels tired, seeing doctor at [facility], ?auto immune related Reviewed past medical,surgical, social and family history. Reviewed medications and allergies.      Objective:   Physical Exam BP 112/71 (BP Location: Left Arm, Patient Position: Sitting, Cuff Size: Normal)   Ht 5' (1.524 m)   Wt 228 lb 6.4 oz (103.6 kg)   LMP 06/27/2018   BMI 44.61 kg/m Urine dipstick negative. Consent signed, time out called. Skin warm and dry, injected right labia with 2 cc 1% lidocaine, waited til numb, hemostat applied and then scissors  used to cut skin tag off at base, minimal bleeding, but silver nitrate to one spot, and 4x4's applied pressure. Examination chaperoned by Diona Fanti CMA. Skin tag place in container to go to pathology. Will try myrbetriq for UI.     Assessment:       1. Labial skin tag   2. Other urinary incontinence    Plan:     Skin tag to path Keep pad on, no ehavy lifting or getting hot today Trial of myrbetriq 25 mg 1 daily #28 tabs given to try F/U in 1 week

## 2018-07-24 NOTE — Telephone Encounter (Signed)
Patient states she went to the bathroom and pulled the 4x4 off and noticed blood was "dripping in the toilet".  Wants to know how much bleeding she could expect.  Advised patient the clot could have pulled off when the 4x4 was removed.  Encouraged patient to keep pressure applied and use some ice as well.  If bleeding continues, to let us know.  Verbalized understanding.

## 2018-07-24 NOTE — Addendum Note (Signed)
Addended by: Diona Fanti A on: 07/24/2018 10:53 AM   Modules accepted: Orders

## 2018-07-28 ENCOUNTER — Telehealth: Payer: Self-pay | Admitting: Adult Health

## 2018-07-28 NOTE — Telephone Encounter (Signed)
Mailbox full

## 2018-08-02 ENCOUNTER — Encounter: Payer: Self-pay | Admitting: Adult Health

## 2018-08-02 ENCOUNTER — Ambulatory Visit (INDEPENDENT_AMBULATORY_CARE_PROVIDER_SITE_OTHER): Payer: Medicare Other | Admitting: Adult Health

## 2018-08-02 ENCOUNTER — Other Ambulatory Visit: Payer: Self-pay

## 2018-08-02 VITALS — BP 116/72 | HR 74 | Ht 60.0 in | Wt 232.0 lb

## 2018-08-02 DIAGNOSIS — N39498 Other specified urinary incontinence: Secondary | ICD-10-CM

## 2018-08-02 NOTE — Progress Notes (Signed)
  Subjective:     Patient ID: Teresa Tran, female   DOB: Jan 09, 1981, 37 y.o.   MRN: [id number]  HPI Teresa Tran is a 37 year old white female, back in follow up of having skin tag removed and UI, still has UI, and area healed where skin tag removed.  Review of Systems  Still with UI, on myrbetriq Area where skin tag removed healed, no complaints there  Reviewed past medical,surgical, social and family history. Reviewed medications and allergies.     Objective:   Physical Exam BP 116/72 (BP Location: Right Arm, Patient Position: Sitting, Cuff Size: Normal)   Pulse 74   Ht 5' (1.524 m)   Wt 232 lb (105.2 kg)   LMP 07/28/2018   BMI 45.31 kg/m  Skin warm and dry.Pelvic: external genitalia is normal in appearance no lesions, has healed, without scar.    Assessment:     Urinary incontinence  Sp skin tag removal,healed    Plan:     Continue myrbetriq, if not better can increase to 50 mg daily, and may need urology referral  Has appt 10/1 for physical

## 2018-08-08 ENCOUNTER — Other Ambulatory Visit: Payer: Medicare Other | Admitting: Adult Health

## 2018-08-22 ENCOUNTER — Emergency Department (HOSPITAL_COMMUNITY)
Admission: EM | Admit: 2018-08-22 | Discharge: 2018-08-22 | Disposition: A | Discharge status: Home / Self Care | Payer: Medicare Other | Attending: Emergency Medicine | Admitting: Emergency Medicine

## 2018-08-22 ENCOUNTER — Other Ambulatory Visit: Payer: Self-pay

## 2018-08-22 ENCOUNTER — Emergency Department (HOSPITAL_COMMUNITY): Payer: Medicare Other

## 2018-08-22 ENCOUNTER — Encounter (HOSPITAL_COMMUNITY): Payer: Self-pay

## 2018-08-22 DIAGNOSIS — M542 Cervicalgia: Secondary | ICD-10-CM | POA: Insufficient documentation

## 2018-08-22 DIAGNOSIS — R079 Chest pain, unspecified: Secondary | ICD-10-CM | POA: Diagnosis not present

## 2018-08-22 DIAGNOSIS — R0789 Other chest pain: Secondary | ICD-10-CM

## 2018-08-22 DIAGNOSIS — Z79899 Other long term (current) drug therapy: Secondary | ICD-10-CM | POA: Diagnosis not present

## 2018-08-22 DIAGNOSIS — I10 Essential (primary) hypertension: Secondary | ICD-10-CM | POA: Diagnosis not present

## 2018-08-22 DIAGNOSIS — R05 Cough: Secondary | ICD-10-CM | POA: Diagnosis not present

## 2018-08-22 DIAGNOSIS — R0602 Shortness of breath: Secondary | ICD-10-CM | POA: Diagnosis not present

## 2018-08-22 LAB — CBC
HEMATOCRIT: 38.8 % (ref 36.0–46.0)
HEMOGLOBIN: 11.9 g/dL — AB (ref 12.0–15.0)
MCH: 25.8 pg — AB (ref 26.0–34.0)
MCHC: 30.7 g/dL (ref 30.0–36.0)
MCV: 84.2 fL (ref 80.0–100.0)
Platelets: 253 10*3/uL (ref 150–400)
RBC: 4.61 MIL/uL (ref 3.87–5.11)
RDW: 15 % (ref 11.5–15.5)
WBC: 7.7 10*3/uL (ref 4.0–10.5)
nRBC: 0 % (ref 0.0–0.2)

## 2018-08-22 LAB — BASIC METABOLIC PANEL
Anion gap: 9 (ref 5–15)
BUN: 10 mg/dL (ref 6–20)
CHLORIDE: 100 mmol/L (ref 98–111)
CO2: 29 mmol/L (ref 22–32)
Calcium: 8.8 mg/dL — ABNORMAL LOW (ref 8.9–10.3)
Creatinine, Ser: 0.59 mg/dL (ref 0.44–1.00)
GFR calc Af Amer: >60 mL/min (ref >60)
GFR calc non Af Amer: >60 mL/min (ref >60)
GLUCOSE: 102 mg/dL — AB (ref 70–99)
POTASSIUM: 3.5 mmol/L (ref 3.5–5.1)
Sodium: 138 mmol/L (ref 135–145)

## 2018-08-22 LAB — TROPONIN I: Troponin I: <0.03 ng/mL (ref <0.03)

## 2018-08-22 LAB — HCG, QUANTITATIVE, PREGNANCY: hCG, Beta Chain, Quant, S: <1 m[IU]/mL (ref <5)

## 2018-08-22 MED ORDER — PREDNISONE 20 MG PO TABS: 40.0000 mg | ORAL_TABLET | Freq: Every day | ORAL | 0 refills | Status: DC

## 2018-08-22 MED ORDER — PREDNISONE 50 MG PO TABS
60.0000 mg | ORAL_TABLET | ORAL | Status: AC
  Administered 2018-08-22: 60 mg via ORAL
  Filled 2018-08-22: qty 1

## 2018-08-22 NOTE — Discharge Instructions (Signed)
As discussed, your evaluation today has been largely reassuring.  But, it is important that you monitor your condition carefully, and do not hesitate to return to the ED if you develop new, or concerning changes in your condition. ? ?Otherwise, please follow-up with your physician for appropriate ongoing care. ? ?

## 2018-08-22 NOTE — ED Provider Notes (Signed)
[facility] EMERGENCY DEPARTMENT Provider Note   CSN: [id number] Arrival date & time: 08/22/18  1215     History   Chief Complaint Chief Complaint  Patient presents with  . Chest Pain    HPI Teresa Tran is a 37 y.o. female.  HPI Patient presents with concern of chest and neck pain. Is unclear how long the pain is been there, but seems over the past few days the pain is become worse. She acknowledges multiple medical issues including ongoing generalized fatigue, and reported overall decline in functionality over the past 2 years. She reports concern that this may all be due to [facility] spotted fever which she had 2 years ago. She has been working with her physician, and is scheduled to see a rheumatologist next month. The pain episode that brings her today is right parasternal, with right neck discomfort, described as soreness, severe, syncope, fever, chills.  He notes that she has had similar pain several times previously has had reassuring evaluations.  Past Medical History:  Diagnosis Date  . Allergy    medicine  . Anemia   . Anxiety   . Bipolar disorder ([place])   . BV (bacterial vaginosis) 08/15/2014  . CSF leak   . Depression   . GERD (gastroesophageal reflux disease)   . Headache(784.0)   . Hypertension   . Low back pain 08/15/2014  . Obesity   . Ulcer   . Urinary frequency 08/30/2014  . Vaginal discharge 08/15/2014  . Vaginal irritation 02/12/2014  . Yeast infection 02/12/2014    Patient Active Problem List   Diagnosis Date Noted  . Overflow incontinence of urine 07/24/2018  . Labial skin tag 07/24/2018  . Absence of bladder continence 07/24/2018  . Prediabetes 06/12/2018  . Liver lesion 09/07/2017  . Proteinuria 09/07/2017  . Essential hypertension 09/01/2017  . Migraine 06/21/2017  . Chronic fatigue 06/21/2017  . Morbid obesity ([place]) 06/21/2017  . GERD (gastroesophageal reflux disease) 12/22/2016  . Uterine leiomyoma 12/02/2016  . Menorrhagia with  irregular cycle 12/02/2016  . Normocytic anemia 09/11/2014  . PUD (peptic ulcer disease) 06/06/2014  . Vitamin D deficiency 02/19/2013  . Bipolar 1 disorder ([place]) 03/23/2012  . PTSD (post-traumatic stress disorder) 03/23/2012    Past Surgical History:  Procedure Laterality Date  . CHOLECYSTECTOMY  Feb 2015   Dr. Ladona Horns, [place]  . ENDOSCOPIC CONCHA BULLOSA RESECTION Left 07/20/2016   Procedure: ENDOSCOPIC LEFT  CONCHA BULLOSA RESECTION;  Surgeon: Leta Baptist, MD;  Location: [place];  Service: ENT;  Laterality: Left;  . ESOPHAGOGASTRODUODENOSCOPY N/A 06/11/2014   PYK:DXIP DUODENITIS/MODERATE EROSIVE GASTRICTIS IN ANTRUM/PARTIALLY HEALED ULCERS  . ESOPHAGOGASTRODUODENOSCOPY N/A 05/06/2015   Procedure: ESOPHAGOGASTRODUODENOSCOPY (EGD);  Surgeon: Danie Binder, MD;  [facility];  Service: Endoscopy;  Laterality: N/A;  1300 - moved to 1:15 - office to notify  . ESOPHAGOGASTRODUODENOSCOPY (EGD) WITH ESOPHAGEAL DILATION N/A 02/11/2014   Dr. 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 EXCISION CHONCHA BULLOSA N/A 07/20/2016   Procedure: NASAL SEPTOPLASTY WITH BILATERAL TURBINATE REDUCTION;  Surgeon: Leta Baptist, MD;  Location: [place];  Service: ENT;  Laterality: N/A;  . RHINOPLASTY    . SAVORY DILATION N/A 05/06/2015   Procedure: SAVORY DILATION;  Surgeon: Danie Binder, MD;  [facility];  Service: Endoscopy;  Laterality: N/A;  . TUBAL LIGATION       OB History    Gravida  2   Para  2  Term  1   Preterm  1   AB      Living  2     SAB      TAB      Ectopic      Multiple      Live Births  2            Home Medications    Prior to Admission medications   Medication Sig Start Date End Date Taking? Authorizing Provider  acetaminophen (TYLENOL) 500 MG tablet Take 1,000 mg by mouth every 6 (six) hours as needed for mild pain or headache.   Yes [provider]  chlorthalidone  (HYGROTON) 25 MG tablet Take 1 tablet (25 mg total) by mouth daily. 06/12/18  Yes Dettinger, Fransisca Kaufmann, MD  dexlansoprazole (DEXILANT) 60 MG capsule Take 1 capsule (60 mg total) by mouth daily before breakfast. 09/07/17  Yes Mahala Menghini, PA-C  DULoxetine (CYMBALTA) 60 MG capsule Take 60 mg by mouth daily.    Yes [provider]  methylphenidate (RITALIN) 10 MG tablet Take 1 tablet by mouth 3 (three) times daily. 05/20/18  Yes [provider]  Vitamin D, Ergocalciferol, (DRISDOL) 50000 units CAPS capsule Take 1 capsule (50,000 Units total) by mouth every 7 (seven) days. For 12 weeks 06/12/18   Dettinger, Fransisca Kaufmann, MD    Family History Family History  Problem Relation Age of Onset  . Depression Father   . Anxiety disorder Father   . Diabetes Father   . Hypertension Father   . Hyperlipidemia Father   . Kidney disease Father   . Mental illness Father   . Pneumonia Father   . Stroke Father   . Depression Sister   . Anxiety disorder Sister   . Cancer Sister        cervical  . Stroke Sister   . Depression Brother   . Anxiety disorder Brother   . Cancer - Other Paternal Grandfather           . Cancer Paternal Grandfather        prostate  . Kidney disease Mother   . Diabetes Mother   . Cancer Mother        kidney  . Hyperlipidemia Mother   . Hypertension Mother   . Stroke Mother   . Aneurysm Maternal Grandfather   . Cancer Maternal Grandmother 17       breast  . Cancer Paternal Grandmother        bladder  . ADD / ADHD Neg Hx   . Alcohol abuse Neg Hx   . Drug abuse Neg Hx   . Bipolar disorder Neg Hx   . Dementia Neg Hx   . OCD Neg Hx   . Paranoid behavior Neg Hx   . Schizophrenia Neg Hx   . Seizures Neg Hx   . Sexual abuse Neg Hx   . Physical abuse Neg Hx   . Colon cancer Neg Hx     Social History Social History   Tobacco Use  . Smoking status: Never Smoker  . Smokeless tobacco: Never Used  Substance Use Topics  . Alcohol use: No    Alcohol/week:  0.0 standard drinks  . Drug use: No     Allergies   Macrobid [nitrofurantoin monohyd macro] and Risperdal [risperidone]   Review of Systems Review of Systems  Constitutional:       Per HPI, otherwise negative  HENT:       Per HPI, otherwise negative  Respiratory:       Per HPI, otherwise negative  Cardiovascular:       Per HPI, otherwise negative  Gastrointestinal: Negative for vomiting.  Endocrine:       Negative aside from HPI  Genitourinary:       Neg aside from HPI   Musculoskeletal:       Per HPI, otherwise negative  Skin: Negative.   Neurological: Negative for syncope.  Psychiatric/Behavioral: Positive for decreased concentration and dysphoric mood. The patient is nervous/anxious.      Physical Exam Updated Vital Signs BP (!) 129/91 (BP Location: Right Arm)   Pulse 93   Temp (!) 97.5 F (36.4 C) (Oral)   Resp 16   Ht 5' (1.524 m)   Wt 105 kg   LMP 07/28/2018   SpO2 100%   BMI 45.21 kg/m   Physical Exam  Constitutional: She is oriented to person, place, and time. She appears well-developed and well-nourished. No distress.  HENT:  Head: Normocephalic and atraumatic.  Eyes: Conjunctivae and EOM are normal.  Cardiovascular: Normal rate and regular rhythm.  Pulmonary/Chest: Effort normal and breath sounds normal. No stridor. No respiratory distress.  Abdominal: She exhibits no distension.  Musculoskeletal: She exhibits no edema.  Neurological: She is alert and oriented to person, place, and time. No cranial nerve deficit.  Skin: Skin is warm and dry.  Psychiatric: She has a normal mood and affect.  Nursing note and vitals reviewed.    ED Treatments / Results  Labs (all labs ordered are listed, but only abnormal results are displayed) Labs Reviewed  BASIC METABOLIC PANEL - Abnormal; Notable for the following components:      Result Value   Glucose, Bld 102 (*)    Calcium 8.8 (*)    All other components within normal limits  CBC - Abnormal;  Notable for the following components:   Hemoglobin 11.9 (*)    MCH 25.8 (*)    All other components within normal limits  TROPONIN I  HCG, QUANTITATIVE, PREGNANCY    EKG Sinus rhythm, rate 93, wander, otherwise unremarkable. Radiology Dg Chest 2 View  Result Date: 08/22/2018 CLINICAL DATA:  Chest pain, cough, and shortness of breath. EXAM: CHEST - 2 VIEW COMPARISON:  Chest x-ray dated May 09, 2018. FINDINGS: The heart size and mediastinal contours are within normal limits. Both lungs are clear. The visualized skeletal structures are unremarkable. IMPRESSION: No active cardiopulmonary disease. Electronically Signed   By: Titus Dubin M.D.   On: 08/22/2018 12:44    Procedures Procedures (including critical care time)  Medications Ordered in ED Medications - No data to display   Initial Impression / Assessment and Plan / ED Course  I have reviewed the triage vital signs and the nursing notes.  Pertinent labs & imaging results that were available during my care of the patient were reviewed by me and considered in my medical decision making (see chart for details).     3:18 PM And in no distress, awake, alert, hemodynamically unremarkable.  This patient presented with chest pain.  The evaluation here has been reassuring, with no evidence of ongoing coronary ischemia, infection or other acute new pathology.  Vitals and labs are consistent with the reassuring physical exam.  With some suspicion for inflammatory condition contributing to her recurrent episodes of pain, with otherwise reassuring findings, she will try a short course of steroids. We discussed this at length, and the patient will discuss this with her outpatient providers on follow-up.  The patient is appropriate for further evaluation and management as an outpatient.  Final Clinical Impressions(s) / ED Diagnoses  Atypical chest pain   Carmin Muskrat, MD 08/22/18 1519

## 2018-08-22 NOTE — ED Notes (Signed)
Patient given discharge instruction, verbalized understand. Patient ambulatory out of the department.  

## 2018-08-22 NOTE — ED Triage Notes (Signed)
Pt is having chest pain, SOB, and right sided neck pain that started last night. Central chest pain that feels like pressure. Back pain as well.

## 2018-08-31 DIAGNOSIS — Z23 Encounter for immunization: Secondary | ICD-10-CM | POA: Diagnosis not present

## 2018-08-31 NOTE — Progress Notes (Signed)
REVIEWED.  

## 2018-09-08 DIAGNOSIS — R51 Headache: Secondary | ICD-10-CM | POA: Diagnosis not present

## 2018-09-08 DIAGNOSIS — R5382 Chronic fatigue, unspecified: Secondary | ICD-10-CM | POA: Diagnosis not present

## 2018-09-08 DIAGNOSIS — M2550 Pain in unspecified joint: Secondary | ICD-10-CM | POA: Diagnosis not present

## 2018-09-12 ENCOUNTER — Other Ambulatory Visit: Payer: Medicare Other | Admitting: Adult Health

## 2018-09-20 ENCOUNTER — Ambulatory Visit: Payer: Medicare Other | Admitting: Gastroenterology

## 2018-09-28 ENCOUNTER — Encounter: Payer: Self-pay | Admitting: Adult Health

## 2018-09-28 ENCOUNTER — Ambulatory Visit (INDEPENDENT_AMBULATORY_CARE_PROVIDER_SITE_OTHER): Payer: Medicare Other | Admitting: Adult Health

## 2018-09-28 ENCOUNTER — Other Ambulatory Visit (HOSPITAL_COMMUNITY)
Admission: RE | Admit: 2018-09-28 | Discharge: 2018-09-28 | Disposition: A | Discharge status: Home / Self Care | Payer: Medicare Other | Source: Ambulatory Visit | Attending: Adult Health | Admitting: Adult Health

## 2018-09-28 VITALS — BP 129/78 | HR 87 | Ht 60.0 in | Wt 236.0 lb

## 2018-09-28 DIAGNOSIS — Z01419 Encounter for gynecological examination (general) (routine) without abnormal findings: Secondary | ICD-10-CM | POA: Insufficient documentation

## 2018-09-28 DIAGNOSIS — N39498 Other specified urinary incontinence: Secondary | ICD-10-CM

## 2018-09-28 DIAGNOSIS — Z124 Encounter for screening for malignant neoplasm of cervix: Secondary | ICD-10-CM | POA: Diagnosis not present

## 2018-09-28 NOTE — Progress Notes (Signed)
Patient ID: Teresa Tran, female   DOB: Sep 30, 1981, 37 y.o.   MRN: [id number] History of Present Illness: Teresa Tran is a 37 year old white female,married, G2P2 in for well woman gyn exam and pap. PCP is Dr Warrick Parisian and she is seeing specialist at [facility] to rule out auto immune disorder.    Current Medications, Allergies, Past Medical History, Past Surgical History, Family History and Social History were reviewed in [employer] record.     Review of Systems:  Patient denies any headaches, hearing loss, fatigue, blurred vision, shortness of breath, chest pain, abdominal pain, problems with bowel movements, urination(+leakage of urine, feels wet all the time, and is up at night), or intercourse. No  mood swings.She is sp tubal.  +body aches and joint pains  Physical Exam:BP 129/78 (BP Location: Left Arm, Patient Position: Sitting, Cuff Size: Large)   Pulse 87   Ht 5' (1.524 m)   Wt 236 lb (107 kg)   LMP 09/06/2018   BMI 46.09 kg/m  General:  Well developed, well nourished, no acute distress Skin:  Warm and dry Neck:  Midline trachea, normal thyroid, good ROM, no lymphadenopathy Lungs; Clear to auscultation bilaterally Breast:  No dominant palpable mass, retraction, or nipple discharge Cardiovascular: Regular rate and rhythm Abdomen:  Soft, non tender, no hepatosplenomegaly Pelvic:  External genitalia is normal in appearance, no lesions.  The vagina is normal in appearance. Urethra has no lesions or masses. The cervix is bulbous. Pap with HPV and GC/CHL performed. Uterus is felt to be normal size, shape, and contour.  No adnexal masses or tenderness noted.Bladder is non tender, no masses felt. Extremities/musculoskeletal:  No swelling or varicosities noted, no clubbing or cyanosis Psych:  No mood changes, alert and cooperative,seems happy PHQ 2 score 0, Fall risk is low. Examination chaperoned by Diona Fanti, CMA. Discussed weight loss, may help, with UI and will refer to  urologist.   Impression:  1. Encounter for gynecological examination with Papanicolaou smear of cervix   2. Other urinary incontinence      Plan: Physical in 1 year Pap in 3 if normal Refer to urologist Mammogram at [place] with PCP

## 2018-09-28 NOTE — Addendum Note (Signed)
Addended by: Diona Fanti A on: 09/28/2018 11:23 AM   Modules accepted: Orders

## 2018-09-29 LAB — CYTOLOGY - PAP
Chlamydia: NEGATIVE
Diagnosis: NEGATIVE
HPV: NOT DETECTED
Neisseria Gonorrhea: NEGATIVE

## 2018-11-26 DIAGNOSIS — G4730 Sleep apnea, unspecified: Secondary | ICD-10-CM | POA: Diagnosis not present

## 2018-11-26 DIAGNOSIS — G4733 Obstructive sleep apnea (adult) (pediatric): Secondary | ICD-10-CM | POA: Diagnosis not present

## 2018-12-04 DIAGNOSIS — M792 Neuralgia and neuritis, unspecified: Secondary | ICD-10-CM | POA: Diagnosis not present

## 2018-12-04 DIAGNOSIS — R768 Other specified abnormal immunological findings in serum: Secondary | ICD-10-CM | POA: Diagnosis not present

## 2018-12-07 ENCOUNTER — Encounter: Payer: Self-pay | Admitting: Gastroenterology

## 2018-12-07 ENCOUNTER — Ambulatory Visit (INDEPENDENT_AMBULATORY_CARE_PROVIDER_SITE_OTHER): Payer: Medicare Other | Admitting: Gastroenterology

## 2018-12-07 ENCOUNTER — Encounter

## 2018-12-07 ENCOUNTER — Other Ambulatory Visit: Payer: Self-pay

## 2018-12-07 DIAGNOSIS — R195 Other fecal abnormalities: Secondary | ICD-10-CM | POA: Insufficient documentation

## 2018-12-07 DIAGNOSIS — K219 Gastro-esophageal reflux disease without esophagitis: Secondary | ICD-10-CM

## 2018-12-07 MED ORDER — DEXLANSOPRAZOLE 60 MG PO CPDR: 60.0000 mg | DELAYED_RELEASE_CAPSULE | Freq: Every day | ORAL | 3 refills | Status: DC

## 2018-12-07 NOTE — Assessment & Plan Note (Signed)
DUE TO DAIRY AND FAT IN DIET. SYMPTOMS NOT IDEALLY CONTROLLED.  DRINK WATER TO KEEP YOUR URINE LIGHT YELLOW. TO PREVENT DIARRHEA:    1. IF YOU CONSUME DAIRY, ADD LACTASE 3 PILLS WITH MEALS UP TO THREE TIMES A DAY.   2. CHEW ONE TUMS WITH MEALS UP TO THREE TIMES A  DAY TO PREVENT DIARRHEA BECAUSE YOU DO NOT HAVE A GALLBLADDER.   3. FOLLOW LOW FAT.  HANDOUT GIVEN.  DISCUSSED HOW/WHY DAIRY AND FAT CAUSE LOOSE STOOLS.   FOLLOW UP IN ONE YEAR.

## 2018-12-07 NOTE — Patient Instructions (Addendum)
DRINK WATER TO KEEP YOUR URINE LIGHT YELLOW.  CONTINUE YOUR WEIGHT LOSS EFFORTS. SEE NUTRITION FOR DIET/WEIGHT LOSS INSTRUCTION.  RECOMMEND CHAIR YOGA 3 TIMES A WEEK.     TO PREVENT DIARRHEA:    1. IF YOU CONSUME DAIRY, ADD LACTASE 3 PILLS WITH MEALS UP TO THREE TIMES A DAY.   2. CHEW ONE TUMS WITH MEALS UP TO THREE TIMES A  DAY TO PREVENT DIARRHEA BECAUSE YOU DO NOT HAVE A GALLBLADDER.   3. FOLLOW LOW FAT. SEE INFO BELOW.   FOLLOW UP IN ONE YEAR.  Low-Fat Diet BREADS, CEREALS, PASTA, RICE, DRIED PEAS, AND BEANS These products are high in carbohydrates and most are low in fat. Therefore, they can be increased in the diet as substitutes for fatty foods. They too, however, contain calories and should not be eaten in excess. Cereals can be eaten for snacks as well as for breakfast.  Include foods that contain fiber (fruits, vegetables, whole grains, and legumes). Research shows that fiber may lower blood cholesterol levels, especially the water-soluble fiber found in fruits, vegetables, oat products, and legumes. FRUITS AND VEGETABLES It is good to eat fruits and vegetables. Besides being sources of fiber, both are rich in vitamins and some minerals. They help you get the daily allowances of these nutrients. Fruits and vegetables can be used for snacks and desserts. MEATS Limit lean meat, chicken, [place], and fish to no more than 6 ounces per day. Beef, Pork, and Lamb Use lean cuts of beef, pork, and lamb. Lean cuts include:  Extra-lean ground beef.  Arm roast.  Sirloin tip.  Center-cut ham.  Round steak.  Loin chops.  Rump roast.  Tenderloin.  Trim all fat off the outside of meats before cooking. It is not necessary to severely decrease the intake of red meat, but lean choices should be made. Lean meat is rich in protein and contains a highly absorbable form of iron. Premenopausal women, in particular, should avoid reducing lean red meat because this could increase the risk for low  red blood cells (iron-deficiency anemia).  Chicken and [place] These are good sources of protein. The fat of poultry can be reduced by removing the skin and underlying fat layers before cooking. Chicken and [place] can be substituted for lean red meat in the diet. Poultry should not be fried or covered with high-fat sauces. Fish and Shellfish Fish is a good source of protein. Shellfish contain cholesterol, but they usually are low in saturated fatty acids. The preparation of fish is important. Like chicken and [place], they should not be fried or covered with high-fat sauces. EGGS Egg whites contain no fat or cholesterol. They can be eaten often. Try 1 to 2 egg whites instead of whole eggs in recipes or use egg substitutes that do not contain yolk.  MILK AND DAIRY PRODUCTS Use skim or 1% milk instead of 2% or whole milk. Decrease whole milk, natural, and processed cheeses. Use nonfat or low-fat (2%) cottage cheese or low-fat cheeses made from vegetable oils. Choose nonfat or low-fat (1 to 2%) yogurt. Experiment with evaporated skim milk in recipes that call for heavy cream. Substitute low-fat yogurt or low-fat cottage cheese for sour cream in dips and salad dressings. Have at least 2 servings of low-fat dairy products, such as 2 glasses of skim (or 1%) milk each day to help get your daily calcium intake.  FATS AND OILS Butterfat, lard, and beef fats are high in saturated fat and cholesterol. These should be avoided.Vegetable fats  do not contain cholesterol. AVOID coconut oil, palm oil, and palm kernel oil, WHICH are very high in saturated fats. These should be limited. These fats are often used in bakery goods, processed foods, popcorn, oils, and nondairy creamers. Vegetable shortenings and some peanut butters contain hydrogenated oils, which are also saturated fats. Read the labels on these foods and check for saturated vegetable oils.  Desirable liquid vegetable oils are corn oil, cottonseed oil, olive  oil, canola oil, safflower oil, soybean oil, and sunflower oil. Peanut oil is not as good, but small amounts are acceptable. Buy a heart-healthy tub margarine that has no partially hydrogenated oils in the ingredients. AVOID Mayonnaise and salad dressings often are made from unsaturated fats.  OTHER EATING TIPS Snacks  Most sweets should be limited as snacks. They tend to be rich in calories and fats, and their caloric content outweighs their nutritional value. Some good choices in snacks are graham crackers, melba toast, soda crackers, bagels (no egg), English muffins, fruits, and vegetables. These snacks are preferable to snack crackers, [place] fries, and chips. Popcorn should be air-popped or cooked in small amounts of liquid vegetable oil.  Desserts Eat fruit, low-fat yogurt, and fruit ices instead of pastries, cake, and cookies. Sherbet, angel food cake, gelatin dessert, frozen low-fat yogurt, or other frozen products that do not contain saturated fat (pure fruit juice bars, frozen ice pops) are also acceptable.   COOKING METHODS Choose those methods that use little or no fat. They include: Poaching.  Braising.  Steaming.  Grilling.  Baking.  Stir-frying.  Broiling.  Microwaving.  Foods can be cooked in a nonstick pan without added fat, or use a nonfat cooking spray in regular cookware. Limit fried foods and avoid frying in saturated fat. Add moisture to lean meats by using water, broth, cooking wines, and other nonfat or low-fat sauces along with the cooking methods mentioned above. Soups and stews should be chilled after cooking. The fat that forms on top after a few hours in the refrigerator should be skimmed off. When preparing meals, avoid using excess salt. Salt can contribute to raising blood pressure in some people.  EATING AWAY FROM HOME Order entres, potatoes, and vegetables without sauces or butter. When meat exceeds the size of a deck of cards (3 to 4 ounces), the rest can be  taken home for another meal. Choose vegetable or fruit salads and ask for low-calorie salad dressings to be served on the side. Use dressings sparingly. Limit high-fat toppings, such as bacon, crumbled eggs, cheese, sunflower seeds, and olives.

## 2018-12-07 NOTE — Progress Notes (Signed)
CC'ED TO PCP 

## 2018-12-07 NOTE — Addendum Note (Signed)
Addended by: Danie Binder on: 12/07/2018 10:36 AM   Modules accepted: Level of Service

## 2018-12-07 NOTE — Progress Notes (Signed)
ON RECALL  °

## 2018-12-07 NOTE — Progress Notes (Signed)
Subjective:    Patient ID: Teresa Tran, female    DOB: Jul 01, 1981, 38 y.o.   MRN: [id number]  Dettinger, Fransisca Kaufmann, MD   HPI CONTINUES WITH ACID REFLUX: COUGHING A LOT WHEN SHE LAYS DOWN. HAVING CRAMPING ABDOMINAL PAIN. GOING TO [facility] FOR POSSIBLE AUTOIMMUNE DISORDER. BMs: DAILY(#5 USUALLY). EATS OUT: NOT MUCH. MILK: WITH BOWL OF CEREAL, CHEESE: 3-4 TIMES A WEEK, ICE CREAM: RARE. NO BREAKFAST. DINNER: BISCUIT-BOJANGLES (BACON/EGG/CHEESE). MAY HAVE TROUBLE SWALLOWING BREAD. BEEN IN ER FOR CHEST PAIN SEVERAL TIMES. ED VISITS: MAY/OCT [facility]: DEC 2019. DIARRHEA: 2X/WEEK AND SOMETIMES NONE AND AFTER WHAT SHE EATS. TRIGGERS FOR DIARRHEA: MILK. WEIGHT UP 16 LBS TODAY FROM 2019. WAS UP TO 236 LBS. 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  PT DENIES FEVER, CHILLS, HEMATOCHEZIA, HEMATEMESIS, nausea, vomiting, melena, CHEST PAIN, SHORTNESS OF BREATH,  CHANGE IN BOWEL IN HABITS, constipation,  problems with sedation, OR heartburn or indigestion.  Past Medical History:  Diagnosis Date  . Allergy    medicine  . Anemia   . Anxiety   . Bipolar disorder ([place])   . BV (bacterial vaginosis) 08/15/2014  . CSF leak   . Depression   . GERD (gastroesophageal reflux disease)   . Headache(784.0)   . Hypertension   . Low back pain 08/15/2014  . Obesity   . Ulcer   . Urinary frequency 08/30/2014  . Vaginal discharge 08/15/2014  . Vaginal irritation 02/12/2014  . Yeast infection 02/12/2014   Past Surgical History:  Procedure Laterality Date  . CHOLECYSTECTOMY  Feb 2015   Dr. Ladona Horns, [place]  . ENDOSCOPIC CONCHA BULLOSA RESECTION Left 07/20/2016   Procedure: ENDOSCOPIC LEFT  CONCHA BULLOSA RESECTION;  Surgeon: Leta Baptist, MD;  Location: [place];  Service: ENT;  Laterality: Left;  . 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 ESOPHAGOGASTRODUODENOSCOPY N/A 05/06/2015   Procedure: ESOPHAGOGASTRODUODENOSCOPY (EGD);   Surgeon: Danie Binder, MD;  [facility];  Service: Endoscopy;  Laterality: N/A;  1300 - moved to 1:15 - office to notify  . ESOPHAGOGASTRODUODENOSCOPY (EGD) WITH ESOPHAGEAL DILATION N/A 02/11/2014   Dr. 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 EXCISION CHONCHA BULLOSA N/A 07/20/2016   Procedure: NASAL SEPTOPLASTY WITH BILATERAL TURBINATE REDUCTION;  Surgeon: Leta Baptist, MD;  Location: [place];  Service: ENT;  Laterality: N/A;  . RHINOPLASTY    . SAVORY DILATION N/A 05/06/2015   Procedure: SAVORY DILATION;  Surgeon: Danie Binder, MD;  [facility];  Service: Endoscopy;  Laterality: N/A;  . TUBAL LIGATION     Allergies  Allergen Reactions  . Macrobid [Nitrofurantoin Monohyd Macro] Itching  . Risperdal [Risperidone] Other (See Comments)    Wt gain   Current Outpatient Medications  Medication Sig    . acetaminophen (TYLENOL) 500 MG tablet Take 1,000 mg by mouth every 6 (six) hours as needed for mild pain or headache.    . chlorthalidone (HYGROTON) 25 MG tablet Take 1 tablet (25 mg total) by mouth daily.    Marland Kitchen dexlansoprazole (DEXILANT) 60 MG capsule Take 1 capsule (60 mg total) by mouth daily before breakfast.    . DULoxetine (CYMBALTA) 60 MG capsule Take 60 mg by mouth daily.     . OXcarbazepine (TRILEPTAL) 150 MG tablet Take 1 tablet by mouth 2 (two) times daily.    . Vitamin D, Ergocalciferol, (DRISDOL) 50000 units CAPS capsule Take 1 capsule (50,000 Units  total) by mouth every 7 (seven) days. For 12 weeks    . methylphenidate (RITALIN) 10 MG tablet Take 1 tablet by mouth 3 (three) times daily.    .       Review of Systems PER HPI OTHERWISE ALL SYSTEMS ARE NEGATIVE.    Objective:   Physical Exam Vitals signs reviewed.  Constitutional:      General: She is not in acute distress.    Appearance: She is well-developed.  HENT:     Head: Normocephalic and atraumatic.     Mouth/Throat:     Pharynx: No  oropharyngeal exudate.  Eyes:     General: No scleral icterus.    Pupils: Pupils are equal, round, and reactive to light.  Neck:     Musculoskeletal: Normal range of motion and neck supple.  Cardiovascular:     Rate and Rhythm: Normal rate and regular rhythm.     Heart sounds: Normal heart sounds.  Pulmonary:     Effort: Pulmonary effort is normal. No respiratory distress.     Breath sounds: Normal breath sounds.  Abdominal:     General: Bowel sounds are normal. There is no distension.     Palpations: Abdomen is soft.     Tenderness: There is no abdominal tenderness.  Musculoskeletal:     Right lower leg: No edema.     Left lower leg: No edema.  Lymphadenopathy:     Cervical: No cervical adenopathy.  Neurological:     General: No focal deficit present.     Mental Status: She is alert and oriented to person, place, and time. Mental status is at baseline.  Psychiatric:        Mood and Affect: Mood normal.        Behavior: Behavior normal.     Comments: FLAT AFFECT       Assessment & Plan:

## 2018-12-07 NOTE — Assessment & Plan Note (Signed)
WEIGHT UP 16 LBS SINCE FEB 2019.   DRINK WATER TO KEEP YOUR URINE LIGHT YELLOW.  CONTINUE YOUR WEIGHT LOSS EFFORTS. SEE NUTRITION FOR DIET/WEIGHT LOSS INSTRUCTION.  RECOMMEND CHAIR YOGA 2-3 TIMES A WEEK.  DISCUSSED BENEFITS OF WEIGHT LOSS AND RISK OF BMI > 40. FOLLOW UP IN ONE YEAR. IF FAILS TO LOSE WEIGHT, WILL REFER FOR BARIATRIC SURGERY.

## 2018-12-07 NOTE — Assessment & Plan Note (Addendum)
SYMPTOMS FAIRLY WELL CONTROLLED.  CONTINUE DEXILANT. REFILL x1 YEAR. DRINK WATER TO KEEP YOUR URINE LIGHT YELLOW.  CONTINUE YOUR WEIGHT LOSS EFFORTS. SEE NUTRITION FOR DIET/WEIGHT LOSS INSTRUCTION. RECOMMEND CHAIR YOGA 3 TIMES A WEEK.   FOLLOW UP IN ONE YEAR.

## 2018-12-13 ENCOUNTER — Encounter: Payer: Self-pay | Admitting: Family Medicine

## 2018-12-13 ENCOUNTER — Ambulatory Visit (INDEPENDENT_AMBULATORY_CARE_PROVIDER_SITE_OTHER): Payer: Medicare Other | Admitting: Family Medicine

## 2018-12-13 VITALS — BP 120/81 | HR 73 | Temp 97.1°F | Ht 60.0 in | Wt 223.0 lb

## 2018-12-13 DIAGNOSIS — R7303 Prediabetes: Secondary | ICD-10-CM | POA: Diagnosis not present

## 2018-12-13 DIAGNOSIS — I10 Essential (primary) hypertension: Secondary | ICD-10-CM

## 2018-12-13 DIAGNOSIS — K219 Gastro-esophageal reflux disease without esophagitis: Secondary | ICD-10-CM | POA: Diagnosis not present

## 2018-12-13 DIAGNOSIS — E559 Vitamin D deficiency, unspecified: Secondary | ICD-10-CM

## 2018-12-13 LAB — BAYER DCA HB A1C WAIVED: HB A1C (BAYER DCA - WAIVED): 5.6 % (ref <7.0)

## 2018-12-13 MED ORDER — CHLORTHALIDONE 25 MG PO TABS: 25.0000 mg | ORAL_TABLET | Freq: Every day | ORAL | 3 refills | Status: DC

## 2018-12-13 NOTE — Progress Notes (Signed)
BP 120/81   Pulse 73   Temp (!) 97.1 F (36.2 C) (Oral)   Ht 5' (1.524 m)   Wt 223 lb (101.2 kg)   LMP 11/23/2018 (Approximate)   BMI 43.55 kg/m    Subjective:    Patient ID: Teresa Tran, female    DOB: 03/20/1981, 38 y.o.   MRN: [id number]  HPI: Teresa Tran is a 38 y.o. female presenting on 12/13/2018 for Medical Management of Chronic Issues (6 month, hypertension)   HPI Prediabetes Patient comes in today for recheck of his diabetes. Patient has been currently taking no medication and has been diet controlled to this point. Patient is not currently on an ACE inhibitor/ARB. Patient has not seen an ophthalmologist this year. Patient denies any issues with their feet.   Hypertension Patient is currently on chlorthalidone, and their blood pressure today is 120/81. Patient denies any lightheadedness or dizziness. Patient denies headaches, blurred vision, chest pains, shortness of breath, or weakness. Denies any side effects from medication and is content with current medication.   GERD Patient is currently on [place].  She denies any major symptoms or abdominal pain or belching or burping. She denies any blood in her stool or lightheadedness or dizziness.   Vitamin D deficiency Patient has a history of vitamin D deficiency and wants it rechecked.  She had been taking the 50,000 units every week for 12 weeks well to see where it is at now.  Relevant past medical, surgical, family and social history reviewed and updated as indicated. Interim medical history since our last visit reviewed. Allergies and medications reviewed and updated.  Review of Systems  Constitutional: Negative for chills and fever.  HENT: Negative for ear discharge.   Eyes: Negative for visual disturbance.  Respiratory: Negative for chest tightness and shortness of breath.   Cardiovascular: Negative for chest pain and leg swelling.  Musculoskeletal: Negative for back pain and gait problem.  Skin: Negative for  rash.  Neurological: Negative for dizziness, light-headedness and headaches.  Psychiatric/Behavioral: Negative for agitation and behavioral problems.  All other systems reviewed and are negative.   Per HPI unless specifically indicated above   Allergies as of 12/13/2018      Reactions   Macrobid [nitrofurantoin Monohyd Macro] Itching   Risperdal [risperidone] Other (See Comments)   Wt gain      Medication List       Accurate as of December 13, 2018 11:59 PM. Always use your most recent med list.        acetaminophen 500 MG tablet Commonly known as:  TYLENOL Take 1,000 mg by mouth every 6 (six) hours as needed for mild pain or headache.   chlorthalidone 25 MG tablet Commonly known as:  HYGROTON Take 1 tablet (25 mg total) by mouth daily.   dexlansoprazole 60 MG capsule Commonly known as:  DEXILANT Take 1 capsule (60 mg total) by mouth daily before breakfast.   DULoxetine 60 MG capsule Commonly known as:  CYMBALTA Take 60 mg by mouth daily.   methylphenidate 10 MG tablet Commonly known as:  RITALIN Take 1 tablet by mouth 3 (three) times daily.   OXcarbazepine 150 MG tablet Commonly known as:  TRILEPTAL Take 1 tablet by mouth 2 (two) times daily.   Vitamin D (Ergocalciferol) 1.25 MG (50000 UT) Caps capsule Commonly known as:  DRISDOL Take 1 capsule (50,000 Units total) by mouth every 7 (seven) days. For 12 weeks          Objective:  BP 120/81   Pulse 73   Temp (!) 97.1 F (36.2 C) (Oral)   Ht 5' (1.524 m)   Wt 223 lb (101.2 kg)   LMP 11/23/2018 (Approximate)   BMI 43.55 kg/m   Wt Readings from Last 3 Encounters:  12/13/18 223 lb (101.2 kg)  12/07/18 223 lb 3.2 oz (101.2 kg)  09/28/18 236 lb (107 kg)    Physical Exam Vitals signs and nursing note reviewed.  Constitutional:      General: She is not in acute distress.    Appearance: She is well-developed. She is not diaphoretic.  Eyes:     Conjunctiva/sclera: Conjunctivae normal.  Cardiovascular:      Rate and Rhythm: Normal rate and regular rhythm.     Heart sounds: Normal heart sounds. No murmur.  Pulmonary:     Effort: Pulmonary effort is normal. No respiratory distress.     Breath sounds: Normal breath sounds. No wheezing.  Musculoskeletal: Normal range of motion.        General: No tenderness.  Skin:    General: Skin is warm and dry.     Findings: No rash.  Neurological:     Mental Status: She is alert and oriented to person, place, and time.     Coordination: Coordination normal.  Psychiatric:        Behavior: Behavior normal.         Assessment & Plan:   Problem List Items Addressed This Visit      Cardiovascular and Mediastinum   Essential hypertension   Relevant Medications   chlorthalidone (HYGROTON) 25 MG tablet   Other Relevant Orders   CMP14+EGFR (Completed)     Digestive   GERD (gastroesophageal reflux disease)     Other   Vitamin D deficiency (Chronic)   Relevant Orders   VITAMIN D 25 Hydroxy (Vit-D Deficiency, Fractures) (Completed)   Morbid obesity ([place])   Relevant Orders   Lipid panel (Completed)   TSH (Completed)   Prediabetes - Primary   Relevant Orders   CMP14+EGFR (Completed)   Bayer DCA Hb A1c Waived (Completed)       Follow up plan: Return in about 6 months (around 06/13/2019), or if symptoms worsen or fail to improve.  Counseling provided for all of the vaccine components Orders Placed This Encounter  Procedures  . CMP14+EGFR  . Lipid panel  . Bayer DCA Hb A1c Waived  . TSH  . VITAMIN D 25 Hydroxy (Vit-D Deficiency, Fractures)    Caryl Pina, MD [facility] 12/19/2018, 10:04 PM

## 2018-12-14 LAB — LIPID PANEL
Chol/HDL Ratio: 3.8 ratio (ref 0.0–4.4)
Cholesterol, Total: 226 mg/dL — ABNORMAL HIGH (ref 100–199)
HDL: 59 mg/dL (ref >39)
LDL Calculated: 142 mg/dL — ABNORMAL HIGH (ref 0–99)
Triglycerides: 125 mg/dL (ref 0–149)
VLDL Cholesterol Cal: 25 mg/dL (ref 5–40)

## 2018-12-14 LAB — CMP14+EGFR
A/G RATIO: 1.8 (ref 1.2–2.2)
ALT: 20 IU/L (ref 0–32)
AST: 18 IU/L (ref 0–40)
Albumin: 4.2 g/dL (ref 3.8–4.8)
Alkaline Phosphatase: 80 IU/L (ref 39–117)
BILIRUBIN TOTAL: 0.2 mg/dL (ref 0.0–1.2)
BUN / CREAT RATIO: 13 (ref 9–23)
BUN: 10 mg/dL (ref 6–20)
CO2: 21 mmol/L (ref 20–29)
Calcium: 9.4 mg/dL (ref 8.7–10.2)
Chloride: 103 mmol/L (ref 96–106)
Creatinine, Ser: 0.75 mg/dL (ref 0.57–1.00)
GFR calc non Af Amer: 103 mL/min/{1.73_m2} (ref >59)
GFR, EST AFRICAN AMERICAN: 119 mL/min/{1.73_m2} (ref >59)
GLOBULIN, TOTAL: 2.3 g/dL (ref 1.5–4.5)
Glucose: 102 mg/dL — ABNORMAL HIGH (ref 65–99)
POTASSIUM: 4 mmol/L (ref 3.5–5.2)
SODIUM: 141 mmol/L (ref 134–144)
TOTAL PROTEIN: 6.5 g/dL (ref 6.0–8.5)

## 2018-12-14 LAB — VITAMIN D 25 HYDROXY (VIT D DEFICIENCY, FRACTURES): VIT D 25 HYDROXY: 17.5 ng/mL — AB (ref 30.0–100.0)

## 2018-12-14 LAB — TSH: TSH: 2.97 u[IU]/mL (ref 0.450–4.500)

## 2018-12-20 ENCOUNTER — Encounter: Payer: Self-pay | Admitting: Psychiatry

## 2018-12-20 ENCOUNTER — Ambulatory Visit (INDEPENDENT_AMBULATORY_CARE_PROVIDER_SITE_OTHER): Payer: Medicare Other | Admitting: Psychiatry

## 2018-12-20 ENCOUNTER — Ambulatory Visit: Payer: Medicare Other | Admitting: Psychiatry

## 2018-12-20 VITALS — BP 129/93 | HR 94

## 2018-12-20 DIAGNOSIS — F419 Anxiety disorder, unspecified: Secondary | ICD-10-CM | POA: Diagnosis not present

## 2018-12-20 DIAGNOSIS — F32A Depression, unspecified: Secondary | ICD-10-CM

## 2018-12-20 DIAGNOSIS — F908 Attention-deficit hyperactivity disorder, other type: Secondary | ICD-10-CM | POA: Diagnosis not present

## 2018-12-20 DIAGNOSIS — F329 Major depressive disorder, single episode, unspecified: Secondary | ICD-10-CM

## 2018-12-20 MED ORDER — DULOXETINE HCL 60 MG PO CPEP: 60.0000 mg | ORAL_CAPSULE | Freq: Every day | ORAL | 2 refills | Status: DC

## 2018-12-20 MED ORDER — PROPRANOLOL HCL 10 MG PO TABS: ORAL_TABLET | ORAL | 0 refills | Status: DC

## 2018-12-20 NOTE — Progress Notes (Signed)
Teresa Tran [id number] Oct 16, 1981 38 y.o.  Subjective:   Patient ID:  Teresa Tran is a 38 y.o. (DOB [birth year]) female.  Chief Complaint:  Chief Complaint  Patient presents with  . Anxiety  . Follow-up    h/o depression and inattention/anergia    HPI Teresa Tran presents to the office today for follow-up of anxiety and depression. She reports that she has been having more anxiety recently. She reports that anxiety seems to increase at certain times. Reports that husband recently lost job of 28 years and this has caused stress, both financially and with trying to emotionally support husband. She notices some worry and feeling nervous. She reports feeling very anxious before going into work and her chest will start to hurt. Asks about medication option for anxiety that will not cause drowsiness. She reports that at times she feels that start of some panic s/s and uses strategies to relieve anxiety. Reports that at times she feels frustrated and irritable when feeling overwhelmed. Notices increased anxiety prior to menses and that this is new for her.   "There have been times that I have felt down" in the last few months without significantly impacting function. Reports that her motivation has been ok overall. Continues to have persistent low energy which she attributes to medical issues. She reports that she has been able to sleep throughout the night. Reports that she is occasionally awakened by dreams and nightmares. She reports, "I'm an emotional eater" and reports that she has made different food choices over the last few months. Reports that she has an upcoming appointment to see a nutritionist. She reports that she has some difficulty with concentration and focus, particularly when she has had a lot on her mind. Reports that she is able to complete tasks. Denies SI. Denies self-harm  She reports "there has been medical progress made" with diagnostic w/u and that medical providers have told her  that she may have a for of lupus.   Reports that Cymbalta causes her some drowsiness and that she has to take it at night. She reports that she has not had Methylphenidate for a few months and it was helpful for concentration during the early part of the day.  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    Past Psychiatric Medication Trials: Rexulti- increased HA's, drowsiness Cymbalta Buspar Cerefolin Topamax Prozac Celexa Lexapro Xanax Ativan Lithium-tremors Lamictal risperdal Seroquel Wellbutrin- Ineffective, HA Phentermine Gabapentin- drowsiness Methylphenidate  Review of Systems:  Review of Systems  Cardiovascular: Negative for palpitations.  Musculoskeletal: Positive for arthralgias. Negative for gait problem.  Neurological: Positive for headaches. Negative for tremors.  Psychiatric/Behavioral:       Please refer to HPI    Medications: I have reviewed the patient's current medications.  Current Outpatient Medications  Medication Sig Dispense Refill  . acetaminophen (TYLENOL) 500 MG tablet Take 1,000 mg by mouth every 6 (six) hours as needed for mild pain or headache.    . chlorthalidone (HYGROTON) 25 MG tablet Take 1 tablet (25 mg total) by mouth daily. 90 tablet 3  . dexlansoprazole (DEXILANT) 60 MG capsule Take 1 capsule (60 mg total) by mouth daily before breakfast. 120 capsule 3  . DULoxetine (CYMBALTA) 60 MG capsule Take 1 capsule (60 mg total) by mouth daily for 30 days. 30 capsule 2  . Vitamin D, Ergocalciferol, (DRISDOL) 50000 units CAPS capsule Take 1 capsule (50,000 Units total) by mouth every 7 (seven) days. For 12 weeks 12  capsule 1  . methylphenidate (RITALIN) 10 MG tablet Take 1 tablet by mouth 3 (three) times daily.  0  . OXcarbazepine (TRILEPTAL) 150 MG tablet Take 1 tablet by mouth 2 (two) times daily.    . propranolol (INDERAL) 10 MG tablet Take 1-2 tabs po BID prn anxiety 120 tablet 0   No current facility-administered medications  for this visit.     Medication Side Effects: Sedation  Allergies:  Allergies  Allergen Reactions  . Macrobid [Nitrofurantoin Monohyd Macro] Itching  . Risperdal [Risperidone] Other (See Comments)    Wt gain    Past Medical History:  Diagnosis Date  . Allergy    medicine  . Anemia   . Anxiety   . Bipolar disorder ([place])   . BV (bacterial vaginosis) 08/15/2014  . CSF leak   . Depression   . GERD (gastroesophageal reflux disease)   . Headache(784.0)   . Hypertension   . Low back pain 08/15/2014  . Obesity   . Ulcer   . Urinary frequency 08/30/2014  . Vaginal discharge 08/15/2014  . Vaginal irritation 02/12/2014  . Yeast infection 02/12/2014    Family History  Problem Relation Age of Onset  . Depression Father   . Anxiety disorder Father   . Diabetes Father   . Hypertension Father   . Hyperlipidemia Father   . Kidney disease Father   . Mental illness Father   . Pneumonia Father   . Stroke Father   . Depression Sister   . Anxiety disorder Sister   . Cancer Sister        cervical  . Stroke Sister   . Depression Brother   . Anxiety disorder Brother   . Cancer - Other Paternal Grandfather           . Cancer Paternal Grandfather        prostate  . Kidney disease Mother   . Diabetes Mother   . Cancer Mother        kidney  . Hyperlipidemia Mother   . Hypertension Mother   . Stroke Mother   . Aneurysm Maternal Grandfather   . Cancer Maternal Grandmother 41       breast  . Cancer Paternal Grandmother        bladder  . ADD / ADHD Neg Hx   . Alcohol abuse Neg Hx   . Drug abuse Neg Hx   . Bipolar disorder Neg Hx   . Dementia Neg Hx   . OCD Neg Hx   . Paranoid behavior Neg Hx   . Schizophrenia Neg Hx   . Seizures Neg Hx   . Sexual abuse Neg Hx   . Physical abuse Neg Hx   . Colon cancer Neg Hx     Social History   Socioeconomic History  . Marital status: Married    Spouse name: Nathaneil Canary  . Number of children: 2  . Years of education: 31  . Highest  education level: Not on file  Occupational History  . Occupation: homehealth  Social Needs  . Financial resource strain: Not on file  . Food insecurity:    Worry: Not on file    Inability: Not on file  . Transportation needs:    Medical: Not on file    Non-medical: Not on file  Tobacco Use  . Smoking status: Never Smoker  . Smokeless tobacco: Never Used  Substance and Sexual Activity  . Alcohol use: No    Alcohol/week: 0.0 standard drinks  .  Drug use: No  . Sexual activity: Yes    Birth control/protection: Surgical    Comment: tubal  Lifestyle  . Physical activity:    Days per week: Not on file    Minutes per session: Not on file  . Stress: Not on file  Relationships  . Social connections:    Talks on phone: Not on file    Gets together: Not on file    Attends religious service: Not on file    Active member of club or organization: Not on file    Attends meetings of clubs or organizations: Not on file    Relationship status: Not on file  . Intimate partner violence:    Fear of current or ex partner: Not on file    Emotionally abused: Not on file    Physically abused: Not on file    Forced sexual activity: Not on file  Other Topics Concern  . 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   Please see review of systems for further details on the patient's review from today.   Objective:   Physical Exam:  BP (!) 129/93   Pulse 94   LMP 11/23/2018 (Approximate)   Physical Exam Constitutional:      General: She is not in acute distress.    Appearance: She is well-developed.  Musculoskeletal:        General: No deformity.  Neurological:     Mental Status: She is alert and oriented to person, place, and time.     Coordination: Coordination  normal.  Psychiatric:        Attention and Perception: Attention and perception normal. She does not perceive auditory or visual hallucinations.        Mood and Affect: Mood is anxious and depressed. Affect is not labile, blunt, angry or inappropriate.        Speech: Speech normal.        Behavior: Behavior normal.        Thought Content: Thought content normal. Thought content does not include homicidal or suicidal ideation. Thought content does not include homicidal or suicidal plan.        Cognition and Memory: Cognition and memory normal.        Judgment: Judgment normal.     Comments: Insight intact. No delusions.      Lab Review:     Component Value Date/Time   NA 141 12/13/2018 0856   K 4.0 12/13/2018 0856   CL 103 12/13/2018 0856   CO2 21 12/13/2018 0856   GLUCOSE 102 (H) 12/13/2018 0856   GLUCOSE 102 (H) 08/22/2018 1235   BUN 10 12/13/2018 0856   CREATININE 0.75 12/13/2018 0856   CREATININE 0.64 09/01/2017 1635   CALCIUM 9.4 12/13/2018 0856   PROT 6.5 12/13/2018 0856   ALBUMIN 4.2 12/13/2018 0856   AST 18 12/13/2018 0856   ALT 20 12/13/2018 0856   ALKPHOS 80 12/13/2018 0856   BILITOT 0.2 12/13/2018 0856   GFRNONAA 103 12/13/2018 0856   GFRNONAA 116 09/01/2017 1635   GFRAA 119 12/13/2018 0856   GFRAA 134 09/01/2017 1635       Component Value Date/Time   WBC 7.7 08/22/2018 1235   RBC 4.61 08/22/2018 1235   HGB  11.9 (L) 08/22/2018 1235   HGB 12.5 06/12/2018 1022   HCT 38.8 08/22/2018 1235   HCT 37.8 06/12/2018 1022   PLT 253 08/22/2018 1235   PLT 273 06/12/2018 1022   MCV 84.2 08/22/2018 1235   MCV 82 06/12/2018 1022   MCH 25.8 (L) 08/22/2018 1235   MCHC 30.7 08/22/2018 1235   RDW 15.0 08/22/2018 1235   RDW 15.1 06/12/2018 1022   LYMPHSABS 2.0 06/12/2018 1022   MONOABS 0.5 10/09/2016 1416   EOSABS 0.1 06/12/2018 1022   BASOSABS 0.0 06/12/2018 1022    Lithium Lvl  Date Value Ref Range Status  08/22/2014 0.80 0.80 - 1.40 mEq/L Final     No results  found for: PHENYTOIN, PHENOBARB, VALPROATE, CBMZ   .res Assessment: Plan:   .Discussed potential benefits, risks, and side effects of several tx options to include Trileptal and Propranolol. Encouraged pt to start script for Trileptal that was prescribed by medical provider for HA's since Trileptal often improves sleep and anxiety as well. Pt agrees to trial of Trileptal. Discussed that she could take Trileptal all at HS if daytime somnolence occurs with BID dosing since pt reports that this is the reason that she has been hesitant about starting Trileptal.  Discussed starting Propranolol prn anxiety since pt reports that she has episodes of increased anxiety. Discussed that Propranolol can be taken as needed when anxiety occurs or 20-30 minutes prior to situations that are likely to trigger her anxiety. Will start Propranolol 10 mg 1-2 tabs po BID prn anxiety.  Continue Cymbalta 60 mg po qd for depression and anxiety.   Discussed not re-starting Methylphenidate until response to other medications are known.   Pt reports that she is interested in starting therapy and provided possible therapy referrals.  Anxiety disorder, unspecified type - Plan: propranolol (INDERAL) 10 MG tablet, DULoxetine (CYMBALTA) 60 MG capsule  Depression, unspecified depression type - Plan: DULoxetine (CYMBALTA) 60 MG capsule  Attention deficit hyperactivity disorder (ADHD), other type  Please see After Visit Summary for patient specific instructions.  Future Appointments  Date Time Provider [place]  12/25/2018  8:00 AM Arlana Lindau, RD [facility] None  01/02/2019  8:00 AM Shanon Ace, LCSW [facility] None  01/22/2019  2:00 PM Thayer Headings, PMHNP [facility] None  06/06/2019  8:25 AM Dettinger, Fransisca Kaufmann, MD [facility] None    No orders of the defined types were placed in this encounter.     -------------------------------

## 2018-12-23 ENCOUNTER — Encounter: Payer: Self-pay | Admitting: Psychiatry

## 2018-12-25 ENCOUNTER — Encounter: Payer: Medicare Other | Attending: Family Medicine | Admitting: Nutrition

## 2018-12-25 DIAGNOSIS — E782 Mixed hyperlipidemia: Secondary | ICD-10-CM | POA: Insufficient documentation

## 2018-12-25 NOTE — Patient Instructions (Signed)
Goals 1. Follow My Plate 2. Make a meal plan and grocery list 3. Cut out junk food 4. Increase exercise 30 minutes twice a week. 5. Drink 5 bottles of water. Lose 1 lb per week  Cut out juice, sodas and diet sodas.

## 2018-12-25 NOTE — Progress Notes (Signed)
  Medical Nutrition Therapy:  Appt start time: 0800 end time:  0930.   Assessment:  Primary concerns today: Obesity, Hyperlipdemia and Lupus. Currently lives with her husband. Has a 37 yr old son. She does the cooking and shopping. Most foods are baked. Eats out 1-2 times per week. Does admit to eating junk food and snacking. Currently taking online couse for her Masters for [employer]. Desires to lose about to 100 lbs.   Strong family history of CAD, losing both her parents to heart disease, DM and obesity.    Vit D level is very low at 17. Is taking 50,000 of Vit D weekly. Just started seeing a ther  Current diet is insuffient to meet her needs. Needs increased physical actvity and more nutrient dense foods. Diet is high in processed foods.   Preferred Learning Style:  No preference indicated   Learning Readiness:  Ready  Change in progress   MEDICATIONS:    DIETARY INTAKE:  24-hr recall:  B ( AM): skips Snk ( AM):   L ( PM): BIscuit, Grape juice or diet pepsi Snk ( PM):  D ( PM): Pizza-2 slices(cheese), chips, Grape juice 8 oz Snk ( PM): Beverages: Diet soda and juice  Usual physical activity: Goes to PT some  Estimated energy needs: 1200 calories 135 g carbohydrates 90- g protein 33 g fat  Progress Towards Goal(s):  In progress.   Nutritional Diagnosis:  NB-1.1 Food and nutrition-related knowledge deficit As related to Obesity and Hyperlipidemia.  As evidenced by BMI > 30 and TCHO > 200.    Intervention:  My Plate, portion sizes, meal planning, cooking more at home, avoiding processed foods and a high fiber low fat low salt diet. Benefits of exercise in water or walking.   Goals 1. Follow My Plate 2. Make a meal plan and grocery list 3. Cut out junk food 4. Increase exercise 30 minutes twice a week. 5. Drink 5 bottles of water. Lose 1 lb per week  Cut out juice, sodas and diet sodas.   Teaching Method Utilized:  Visual Auditory Hands  on  Handouts given during visit include:  The Plate Method   Meal Plan Card   Barriers to learning/adherence to lifestyle change: none  Demonstrated degree of understanding via:  Teach Back   Monitoring/Evaluation:  Dietary intake, exercise, meal planing , and body weight in 1 month(s).

## 2019-01-02 ENCOUNTER — Ambulatory Visit (INDEPENDENT_AMBULATORY_CARE_PROVIDER_SITE_OTHER): Payer: Medicare Other | Admitting: Psychiatry

## 2019-01-02 DIAGNOSIS — F419 Anxiety disorder, unspecified: Secondary | ICD-10-CM | POA: Diagnosis not present
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
## 2019-01-03 ENCOUNTER — Encounter: Payer: Self-pay | Admitting: Nutrition

## 2019-01-12 ENCOUNTER — Ambulatory Visit (INDEPENDENT_AMBULATORY_CARE_PROVIDER_SITE_OTHER): Payer: Medicare Other | Admitting: Psychiatry

## 2019-01-12 DIAGNOSIS — F419 Anxiety disorder, unspecified: Secondary | ICD-10-CM
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
## 2019-01-22 ENCOUNTER — Ambulatory Visit: Payer: Medicare Other | Admitting: Psychiatry

## 2019-01-26 ENCOUNTER — Ambulatory Visit: Payer: Medicare Other | Admitting: Psychiatry

## 2019-01-29 ENCOUNTER — Telehealth: Payer: Self-pay | Admitting: Family Medicine

## 2019-01-29 ENCOUNTER — Other Ambulatory Visit: Payer: Self-pay

## 2019-01-29 ENCOUNTER — Telehealth (INDEPENDENT_AMBULATORY_CARE_PROVIDER_SITE_OTHER): Payer: Medicare Other | Admitting: Family Medicine

## 2019-01-29 DIAGNOSIS — J189 Pneumonia, unspecified organism: Secondary | ICD-10-CM

## 2019-01-29 DIAGNOSIS — R079 Chest pain, unspecified: Secondary | ICD-10-CM

## 2019-01-29 MED ORDER — FLUCONAZOLE 150 MG PO TABS: 150.0000 mg | ORAL_TABLET | Freq: Once | ORAL | 0 refills | Status: AC

## 2019-01-29 MED ORDER — VITAMIN D (ERGOCALCIFEROL) 1.25 MG (50000 UNIT) PO CAPS: 50000.0000 [IU] | ORAL_CAPSULE | ORAL | 1 refills | Status: DC

## 2019-01-29 MED ORDER — AMOXICILLIN-POT CLAVULANATE 875-125 MG PO TABS: 1.0000 | ORAL_TABLET | Freq: Two times a day (BID) | ORAL | 0 refills | Status: DC

## 2019-01-29 MED ORDER — FLUCONAZOLE 150 MG PO TABS: 150.0000 mg | ORAL_TABLET | Freq: Once | ORAL | 0 refills | Status: DC

## 2019-01-29 NOTE — Addendum Note (Signed)
Addended by: Caryl Pina on: 01/29/2019 05:11 PM   Modules accepted: Orders

## 2019-01-29 NOTE — Progress Notes (Signed)
Virtual Visit via telephone Note  I connected with Teresa Tran on 01/29/19 at 1432 by telephone and verified that I am speaking with the correct person using two identifiers. Teresa Tran is currently located at home and no other people are currently with her during visit. The provider, Fransisca Kaufmann Dettinger, MD is located in their office at time of visit.  Call ended at 1450  I discussed the limitations, risks, security and privacy concerns of performing an evaluation and management service by telephone and the availability of in person appointments. I also discussed with the patient that there may be a patient responsible charge related to this service. The patient expressed understanding and agreed to proceed.   History and Present Illness: Cough and congestion and left sided chest pain and crackling. Patient denies any fever but does have exertional shortness of breath.  She has not had any travel or recent contacts with anyone with coronavirus.  She denies any sick contacts that she knows of.  The cough is mostly dry.  The chest pain radiates to left arm and shoulder blade.  Patient believes she is immunosuppressed because of lupus.  No diagnosis found.  Outpatient Encounter Medications as of 01/29/2019  Medication Sig  . acetaminophen (TYLENOL) 500 MG tablet Take 1,000 mg by mouth every 6 (six) hours as needed for mild pain or headache.  . chlorthalidone (HYGROTON) 25 MG tablet Take 1 tablet (25 mg total) by mouth daily.  Marland Kitchen dexlansoprazole (DEXILANT) 60 MG capsule Take 1 capsule (60 mg total) by mouth daily before breakfast.  . DULoxetine (CYMBALTA) 60 MG capsule Take 1 capsule (60 mg total) by mouth daily for 30 days.  . methylphenidate (RITALIN) 10 MG tablet Take 1 tablet by mouth 3 (three) times daily.  . OXcarbazepine (TRILEPTAL) 150 MG tablet Take 1 tablet by mouth 2 (two) times daily.  . propranolol (INDERAL) 10 MG tablet Take 1-2 tabs po BID prn anxiety  . Vitamin D,  Ergocalciferol, (DRISDOL) 50000 units CAPS capsule Take 1 capsule (50,000 Units total) by mouth every 7 (seven) days. For 12 weeks   No facility-administered encounter medications on file as of 01/29/2019.     Review of Systems  Constitutional: Negative for chills and fever.  HENT: Positive for congestion, postnasal drip, rhinorrhea, sinus pressure, sneezing and sore throat. Negative for ear discharge and ear pain.   Eyes: Negative for pain, redness and visual disturbance.  Respiratory: Positive for cough, chest tightness, shortness of breath and wheezing.   Cardiovascular: Positive for chest pain. Negative for leg swelling.  Genitourinary: Negative for difficulty urinating and dysuria.  Musculoskeletal: Negative for back pain and gait problem.  Skin: Negative for rash.  Neurological: Negative for light-headedness and headaches.  Psychiatric/Behavioral: Negative for agitation and behavioral problems.  All other systems reviewed and are negative.   Observations/Objective:   Assessment and Plan: Problem List Items Addressed This Visit    None    Visit Diagnoses    Chest pain, unspecified type    -  Primary   Relevant Medications   amoxicillin-clavulanate (AUGMENTIN) 875-125 MG tablet   fluconazole (DIFLUCAN) 150 MG tablet   Atypical pneumonia       Relevant Medications   amoxicillin-clavulanate (AUGMENTIN) 875-125 MG tablet   fluconazole (DIFLUCAN) 150 MG tablet       Follow Up Instructions: Recommended for patient to self quarantine herself and her family and sent in Augmentin just in case it is pneumonia and if she continues to worsen  she is to go to the emergency department.    I discussed the assessment and treatment plan with the patient. The patient was provided an opportunity to ask questions and all were answered. The patient agreed with the plan and demonstrated an understanding of the instructions.   The patient was advised to call back or seek an in-person  evaluation if the symptoms worsen or if the condition fails to improve as anticipated.  The above assessment and management plan was discussed with the patient. The patient verbalized understanding of and has agreed to the management plan. Patient is aware to call the clinic if symptoms persist or worsen. Patient is aware when to return to the clinic for a follow-up visit. Patient educated on when it is appropriate to go to the emergency department.    I provided 18 minutes of non-face-to-face time during this encounter.    Worthy Rancher, MD

## 2019-01-30 ENCOUNTER — Ambulatory Visit: Payer: Self-pay | Admitting: Nutrition

## 2019-01-30 IMAGING — DX DG CHEST 2V
2 series · 2 of 2 positions shown · non-contrast
Comparison: Chest x-ray dated May 09, 2018.

CLINICAL DATA: Chest pain, cough, and shortness of breath.

EXAM:
CHEST - 2 VIEW

[chest pa]
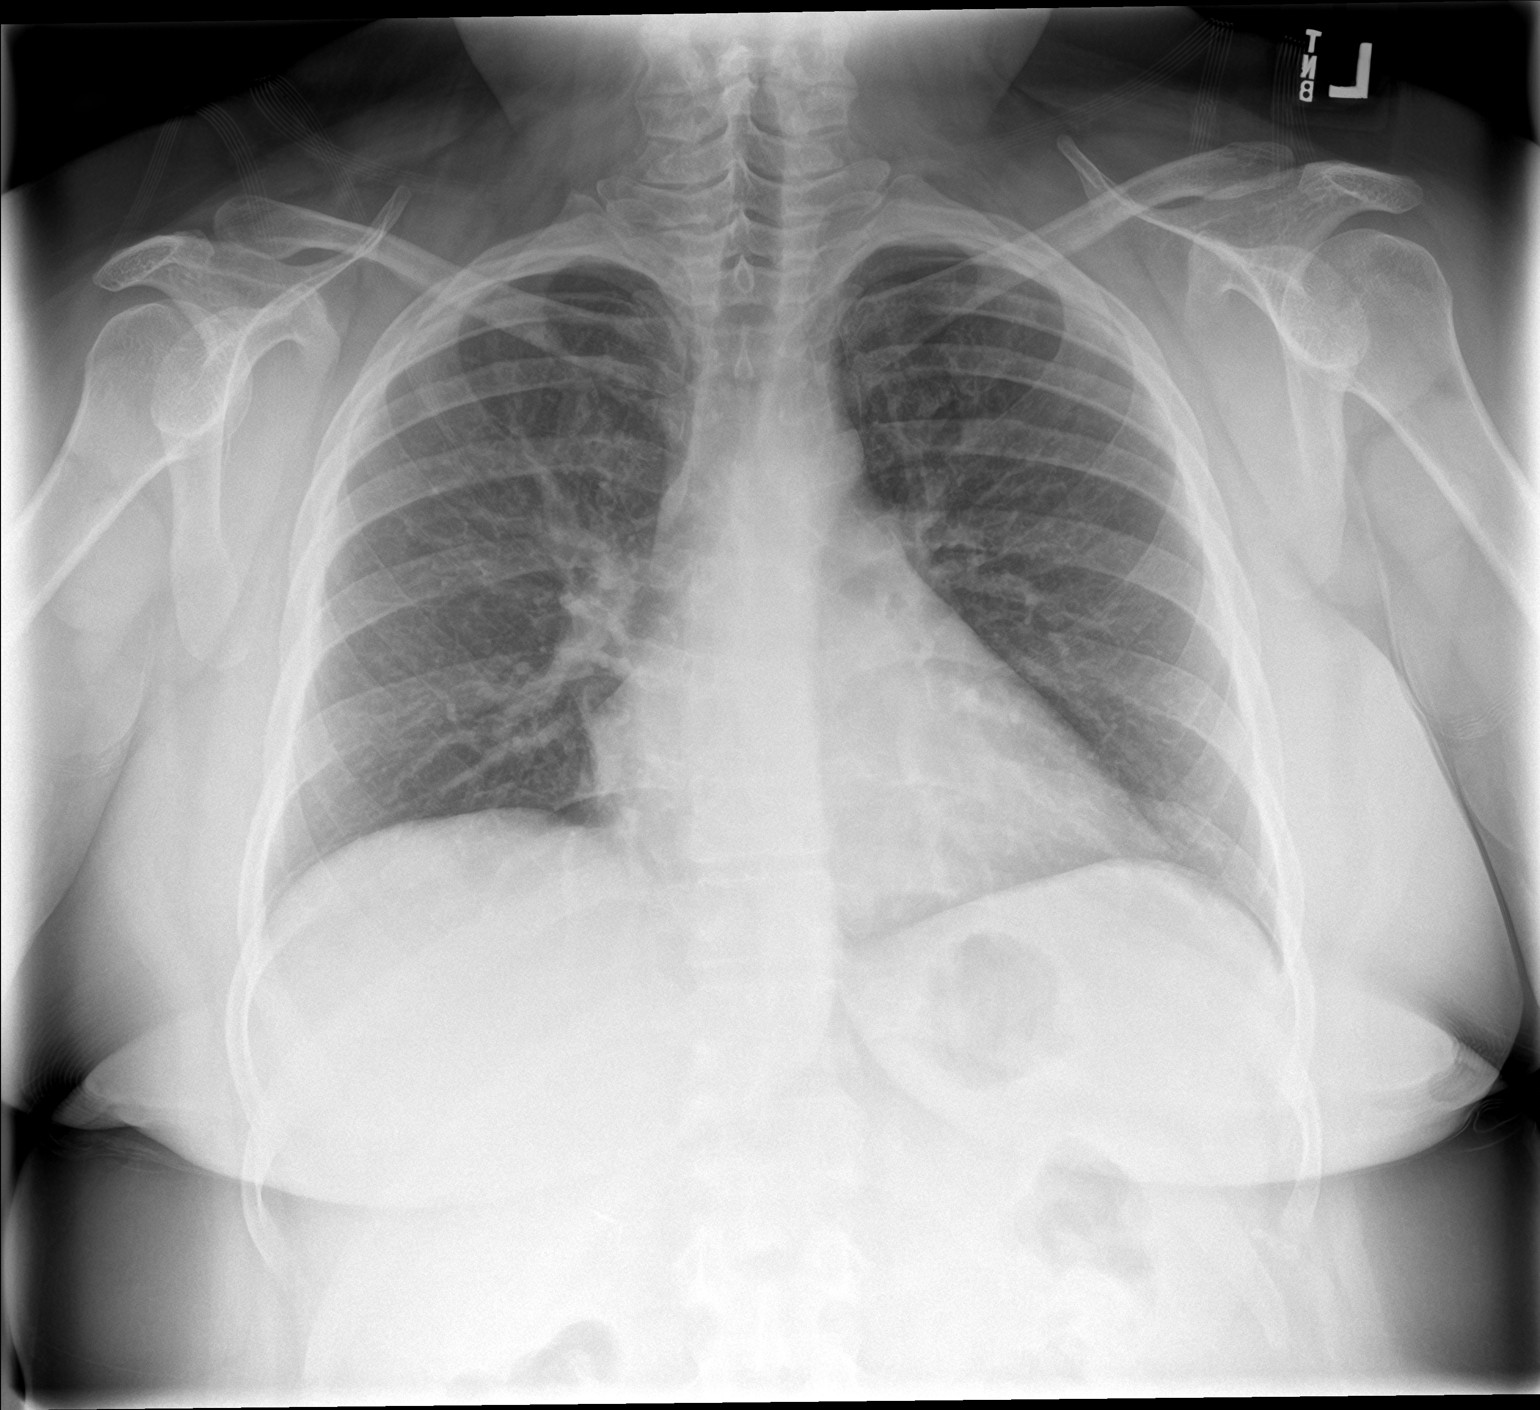

[chest lat]
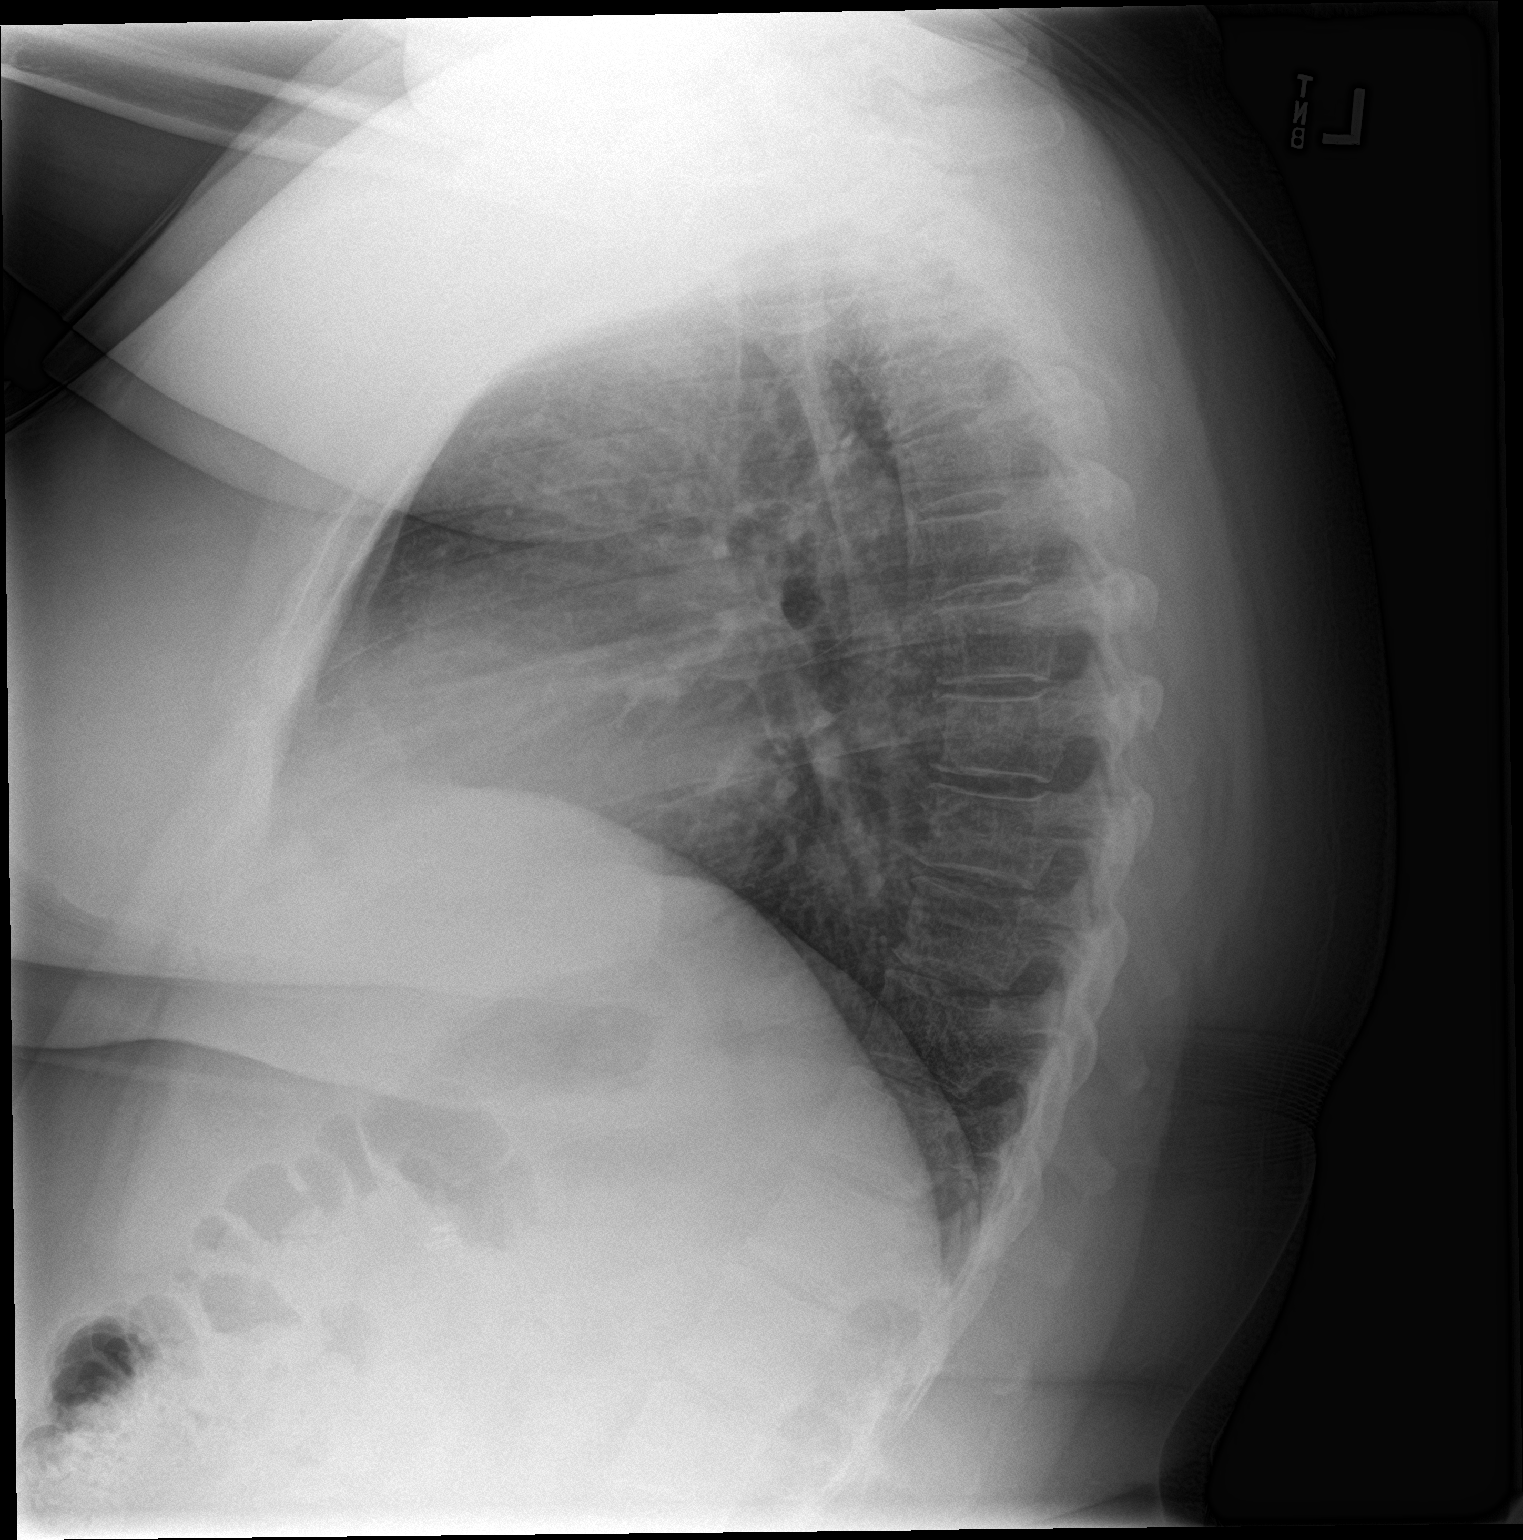

[2 of 2 positions shown; findings below may reference images not displayed]

FINDINGS: The heart size and mediastinal contours are within normal limits.
Both lungs are clear. The visualized skeletal structures are
unremarkable.
IMPRESSION: No active cardiopulmonary disease.

## 2019-02-01 ENCOUNTER — Encounter: Payer: Medicare Other | Attending: Family Medicine | Admitting: Nutrition

## 2019-02-01 ENCOUNTER — Other Ambulatory Visit: Payer: Self-pay

## 2019-02-01 ENCOUNTER — Telehealth: Payer: Self-pay | Admitting: Family Medicine

## 2019-02-01 MED ORDER — ALBUTEROL SULFATE HFA 108 (90 BASE) MCG/ACT IN AERS: 2.0000 | INHALATION_SPRAY | RESPIRATORY_TRACT | 2 refills | Status: DC | PRN

## 2019-02-01 NOTE — Telephone Encounter (Signed)
Patient states she spoke with a dietician from [facility] who suggested patient call to request an order for COVID - 19 test to be done at [facility]  Advised patient that our providers are not ordering COVID - 19 tests.  She states she also feels slightly more short of breath with talking, and requested an inhaler to be called in.  Dr. Warrick Parisian authorized albuterol inhaler- this was sent to patient's pharmacy, and she was notified.  Advised patient if her shortness of breath worsens she should go to ER for evaluation.

## 2019-02-02 ENCOUNTER — Telehealth: Payer: Self-pay | Admitting: Nutrition

## 2019-02-02 NOTE — Telephone Encounter (Signed)
Telephone contact yesterday  02-01-19 Pt. Notes she has been quarantined since Monday for possible signs and symptoms of Corona 19 from her PCP, Dr. Warrick Parisian.  She notes she is having more SOB symptoms and feels she is getting worse.. Denies fever. She notes she doesn't know where to go to get tested for virus. Advised there are testing sited in [place] at [facility] now and that she should contact her PCP or the HD to discuss further action based on symptoms.  She requests to cancel f/u for obesity for a few months. Encouraged her to contact PCP and discuss symptoms and possible need for testing for virus. She verbalized understanding.

## 2019-02-02 NOTE — Progress Notes (Unsigned)
Telephone contact. See documentation.

## 2019-02-02 NOTE — Telephone Encounter (Signed)
We have already contacted patient about this and they are not currently doing testing at any [facility], she is okay to go ahead and cancel her appointment for the obesity and reschedule for when the coronavirus is done.

## 2019-02-06 ENCOUNTER — Ambulatory Visit (INDEPENDENT_AMBULATORY_CARE_PROVIDER_SITE_OTHER): Payer: Medicare Other | Admitting: Family Medicine

## 2019-02-06 ENCOUNTER — Other Ambulatory Visit: Payer: Self-pay

## 2019-02-06 ENCOUNTER — Encounter: Payer: Self-pay | Admitting: Family Medicine

## 2019-02-06 DIAGNOSIS — B3731 Acute candidiasis of vulva and vagina: Secondary | ICD-10-CM

## 2019-02-06 DIAGNOSIS — B373 Candidiasis of vulva and vagina: Secondary | ICD-10-CM | POA: Diagnosis not present

## 2019-02-06 MED ORDER — FLUCONAZOLE 150 MG PO TABS: 150.0000 mg | ORAL_TABLET | Freq: Once | ORAL | 1 refills | Status: AC

## 2019-02-06 NOTE — Progress Notes (Signed)
Virtual Visit via telephone Note  I connected with Teresa Tran on 02/06/19 at 1602 by telephone and verified that I am speaking with the correct person using two identifiers. Teresa Tran is currently located at home and no other people are currently with her during visit. The provider, Fransisca Kaufmann , MD is located in their office at time of visit.  Call ended at 1609  I discussed the limitations, risks, security and privacy concerns of performing an evaluation and management service by telephone and the availability of in person appointments. I also discussed with the patient that there may be a patient responsible charge related to this service. The patient expressed understanding and agreed to proceed.   History and Present Illness: Patient has been on antibiotics and has been improving in her respiratory status.  She took one diflucan but is still vaginal irritation but no urinary dysuria.  Patient has 2 more days left of the Augmentin and her breathing has been doing a lot better and she did take 1 dose of Diflucan and it did help with the vaginal irritation but she feels like it is coming back again.  She would like a second dose of the Diflucan to help with that.  She denies any fevers or chills.  Patient denies any flank pain or abdominal pain or diarrhea or constipation.  She has been having vaginal irritation over the past 6 days since she has been taking the antibiotic  No diagnosis found.  Outpatient Encounter Medications as of 02/06/2019  Medication Sig  . acetaminophen (TYLENOL) 500 MG tablet Take 1,000 mg by mouth every 6 (six) hours as needed for mild pain or headache.  . albuterol (PROVENTIL HFA;VENTOLIN HFA) 108 (90 Base) MCG/ACT inhaler Inhale 2 puffs into the lungs every 4 (four) hours as needed for wheezing or shortness of breath.  Marland Kitchen amoxicillin-clavulanate (AUGMENTIN) 875-125 MG tablet Take 1 tablet by mouth 2 (two) times daily.  . chlorthalidone (HYGROTON) 25 MG  tablet Take 1 tablet (25 mg total) by mouth daily.  Marland Kitchen dexlansoprazole (DEXILANT) 60 MG capsule Take 1 capsule (60 mg total) by mouth daily before breakfast.  . DULoxetine (CYMBALTA) 60 MG capsule Take 1 capsule (60 mg total) by mouth daily for 30 days.  . methylphenidate (RITALIN) 10 MG tablet Take 1 tablet by mouth 3 (three) times daily.  . OXcarbazepine (TRILEPTAL) 150 MG tablet Take 1 tablet by mouth 2 (two) times daily.  . propranolol (INDERAL) 10 MG tablet Take 1-2 tabs po BID prn anxiety  . Vitamin D, Ergocalciferol, (DRISDOL) 1.25 MG (50000 UT) CAPS capsule Take 1 capsule (50,000 Units total) by mouth every 7 (seven) days. For 12 weeks   No facility-administered encounter medications on file as of 02/06/2019.     Review of Systems  Constitutional: Negative for chills and fever.  Eyes: Negative for visual disturbance.  Respiratory: Negative for chest tightness and shortness of breath.   Cardiovascular: Negative for chest pain and leg swelling.  Gastrointestinal: Negative for abdominal pain and diarrhea.  Genitourinary: Positive for vaginal pain. Negative for decreased urine volume, difficulty urinating, dysuria, flank pain, frequency, hematuria, pelvic pain, urgency, vaginal bleeding and vaginal discharge.  Musculoskeletal: Negative for back pain and gait problem.  Skin: Negative for rash.  Neurological: Negative for light-headedness and headaches.  Psychiatric/Behavioral: Negative for agitation and behavioral problems.  All other systems reviewed and are negative.   Observations/Objective: Patient sounds comfortable on the phone and in no acute distress  Assessment and  Plan: Problem List Items Addressed This Visit    None    Visit Diagnoses    Yeast vaginitis    -  Primary   Relevant Medications   fluconazole (DIFLUCAN) 150 MG tablet       Follow Up Instructions: Follow-up as needed    I discussed the assessment and treatment plan with the patient. The patient was  provided an opportunity to ask questions and all were answered. The patient agreed with the plan and demonstrated an understanding of the instructions.   The patient was advised to call back or seek an in-person evaluation if the symptoms worsen or if the condition fails to improve as anticipated.  The above assessment and management plan was discussed with the patient. The patient verbalized understanding of and has agreed to the management plan. Patient is aware to call the clinic if symptoms persist or worsen. Patient is aware when to return to the clinic for a follow-up visit. Patient educated on when it is appropriate to go to the emergency department.    I provided 7 minutes of non-face-to-face time during this encounter.    Worthy Rancher, MD

## 2019-02-08 ENCOUNTER — Ambulatory Visit: Payer: Medicare Other | Admitting: Psychiatry

## 2019-02-14 ENCOUNTER — Ambulatory Visit (INDEPENDENT_AMBULATORY_CARE_PROVIDER_SITE_OTHER): Payer: Medicare Other | Admitting: Psychiatry

## 2019-02-14 ENCOUNTER — Other Ambulatory Visit: Payer: Self-pay

## 2019-02-14 DIAGNOSIS — F419 Anxiety disorder, unspecified: Secondary | ICD-10-CM | POA: Diagnosis not present
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
## 2019-02-28 ENCOUNTER — Other Ambulatory Visit: Payer: Self-pay

## 2019-02-28 ENCOUNTER — Ambulatory Visit (INDEPENDENT_AMBULATORY_CARE_PROVIDER_SITE_OTHER): Payer: Medicare Other | Admitting: Psychiatry

## 2019-02-28 DIAGNOSIS — F419 Anxiety disorder, unspecified: Secondary | ICD-10-CM | POA: Diagnosis not present
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
## 2019-03-09 ENCOUNTER — Other Ambulatory Visit: Payer: Self-pay

## 2019-03-09 ENCOUNTER — Encounter: Payer: Self-pay | Admitting: Psychiatry

## 2019-03-09 ENCOUNTER — Ambulatory Visit (INDEPENDENT_AMBULATORY_CARE_PROVIDER_SITE_OTHER): Payer: Medicare Other | Admitting: Psychiatry

## 2019-03-09 DIAGNOSIS — F32A Depression, unspecified: Secondary | ICD-10-CM

## 2019-03-09 DIAGNOSIS — F908 Attention-deficit hyperactivity disorder, other type: Secondary | ICD-10-CM | POA: Diagnosis not present

## 2019-03-09 DIAGNOSIS — F419 Anxiety disorder, unspecified: Secondary | ICD-10-CM

## 2019-03-09 DIAGNOSIS — F329 Major depressive disorder, single episode, unspecified: Secondary | ICD-10-CM | POA: Diagnosis not present

## 2019-03-09 MED ORDER — METHYLPHENIDATE HCL 10 MG PO TABS: 10.0000 mg | ORAL_TABLET | Freq: Two times a day (BID) | ORAL | 0 refills | Status: DC

## 2019-03-09 MED ORDER — DULOXETINE HCL 60 MG PO CPEP: 60.0000 mg | ORAL_CAPSULE | Freq: Every day | ORAL | 3 refills | Status: DC

## 2019-03-09 NOTE — Progress Notes (Signed)
Teresa Tran [id number] Jan 21, 1981 38 y.o.  Virtual Visit via Video Note  I connected with@ on 03/09/19 at 11:30 AM EDT by a video enabled telemedicine application and verified that I am speaking with the correct person using two identifiers.   I discussed the limitations of evaluation and management by telemedicine and the availability of in person appointments. The patient expressed understanding and agreed to proceed.  I discussed the assessment and treatment plan with the patient. The patient was provided an opportunity to ask questions and all were answered. The patient agreed with the plan and demonstrated an understanding of the instructions.   The patient was advised to call back or seek an in-person evaluation if the symptoms worsen or if the condition fails to improve as anticipated.  I provided 30 minutes of non-face-to-face time during this encounter.  The patient was located at home.  The provider was located at home.   Teresa Tran, PMHNP   Subjective:   Patient ID:  Teresa Tran is a 38 y.o. (DOB 10-16-1981) female.  Chief Complaint:  Chief Complaint  Patient presents with  . Other    Impaired concentration    HPI Teresa Tran presents to the office today for follow-up of impaired concentration, depression, anxiety, and low energy.  "I do feel better emotionally and not having anxiety as bad as I was." She has noticed that her anxiety has improved with "slowing down." She reports that she is "more focused on priorities." She reports that at times she has multiple thoughts and has been working on sorting these out. Reports "waves" of anxiety have been less frequent. Denies experiencing any full blown panic attacks. She reports that her mood has been "good." Denies any recent depression. She reports that her sleep has been "ok for the most part." She reports that she has rare sleepless nights and will feel tired and physically unwell the following day. Notices some  increased anxiety after night of decreased sleep. She reports that on those nights that she seems to be thinking about multiple things. She reports that her appetite has been "fine." Reports low energy and chronic fatigue. Reports that energy is improving following recent pneumonia. She reports "my motivation is there and I really want to do things." Denies SI.   She reports that she was recently treated for pneumonia and was placed in quarantine due to having lupus and being high risk for COVID 19.   Mother-in-law died 2 weeks ago during the pandemic.  Has medical f/u at [facility] on 03/29/19. Reports that she has been dx'd with Lupus  She reports that she has been seeing Rinaldo Cloud, LCSW.   Past Psychiatric Medication Trials: Rexulti- increased HA's, drowsiness Cymbalta Buspar Cerefolin Topamax Prozac Celexa Lexapro Xanax Ativan Lithium-tremors Lamictal risperdal Seroquel Wellbutrin- Ineffective, HA Phentermine Gabapentin- drowsiness Methylphenidate-Effective for concentration in the morning Propranolol- caused dizziness  Review of Systems:  Review of Systems  Constitutional: Positive for fatigue.  Respiratory: Positive for cough and chest tightness.   Cardiovascular: Negative for palpitations.  Musculoskeletal: Positive for arthralgias, back pain and myalgias. Negative for gait problem.  Neurological: Negative for tremors.  Psychiatric/Behavioral:       Please refer to HPI    Medications: I have reviewed the patient's current medications.  Current Outpatient Medications  Medication Sig Dispense Refill  . acetaminophen (TYLENOL) 500 MG tablet Take 1,000 mg by mouth every 6 (six) hours as needed for mild pain or headache.    . albuterol (PROVENTIL  HFA;VENTOLIN HFA) 108 (90 Base) MCG/ACT inhaler Inhale 2 puffs into the lungs every 4 (four) hours as needed for wheezing or shortness of breath. 1 Inhaler 2  . chlorthalidone (HYGROTON) 25 MG tablet Take 1 tablet (25 mg total)  by mouth daily. 90 tablet 3  . dexlansoprazole (DEXILANT) 60 MG capsule Take 1 capsule (60 mg total) by mouth daily before breakfast. 120 capsule 3  . loratadine (CLARITIN) 10 MG tablet Take 10 mg by mouth daily.    . Vitamin D, Ergocalciferol, (DRISDOL) 1.25 MG (50000 UT) CAPS capsule Take 1 capsule (50,000 Units total) by mouth every 7 (seven) days. For 12 weeks 12 capsule 1  . amoxicillin-clavulanate (AUGMENTIN) 875-125 MG tablet Take 1 tablet by mouth 2 (two) times daily. (Patient not taking: Reported on 03/09/2019) 20 tablet 0  . DULoxetine (CYMBALTA) 60 MG capsule Take 1 capsule (60 mg total) by mouth daily for 30 days. 30 capsule 3  . methylphenidate (RITALIN) 10 MG tablet Take 1 tablet (10 mg total) by mouth 2 (two) times daily for 30 days. 60 tablet 0  . [START ON 04/06/2019] methylphenidate (RITALIN) 10 MG tablet Take 1 tablet (10 mg total) by mouth 2 (two) times daily for 30 days. 60 tablet 0  . [START ON 05/04/2019] methylphenidate (RITALIN) 10 MG tablet Take 1 tablet (10 mg total) by mouth 2 (two) times daily for 30 days. 60 tablet 0   No current facility-administered medications for this visit.     Medication Side Effects: None  Allergies:  Allergies  Allergen Reactions  . Macrobid [Nitrofurantoin Monohyd Macro] Itching  . Risperdal [Risperidone] Other (See Comments)    Wt gain    Past Medical History:  Diagnosis Date  . Allergy    medicine  . Anemia   . Anxiety   . Bipolar disorder ([place])   . BV (bacterial vaginosis) 08/15/2014  . CSF leak   . Depression   . GERD (gastroesophageal reflux disease)   . Headache(784.0)   . Hypertension   . Low back pain 08/15/2014  . Lupus ([place])   . Obesity   . Ulcer   . Urinary frequency 08/30/2014  . Vaginal discharge 08/15/2014  . Vaginal irritation 02/12/2014  . Yeast infection 02/12/2014    Family History  Problem Relation Age of Onset  . Depression Father   . Anxiety disorder Father   . Diabetes Father   . Hypertension Father    . Hyperlipidemia Father   . Kidney disease Father   . Mental illness Father   . Pneumonia Father   . Stroke Father   . Depression Sister   . Anxiety disorder Sister   . Cancer Sister        cervical  . Stroke Sister   . Depression Brother   . Anxiety disorder Brother   . Cancer - Other Paternal Grandfather           . Cancer Paternal Grandfather        prostate  . Kidney disease Mother   . Diabetes Mother   . Cancer Mother        kidney  . Hyperlipidemia Mother   . Hypertension Mother   . Stroke Mother   . Aneurysm Maternal Grandfather   . Cancer Maternal Grandmother 34       breast  . Cancer Paternal Grandmother        bladder  . ADD / ADHD Neg Hx   . Alcohol abuse Neg Hx   . Drug  abuse Neg Hx   . Bipolar disorder Neg Hx   . Dementia Neg Hx   . OCD Neg Hx   . Paranoid behavior Neg Hx   . Schizophrenia Neg Hx   . Seizures Neg Hx   . Sexual abuse Neg Hx   . Physical abuse Neg Hx   . Colon cancer Neg Hx     Social History   Socioeconomic History  . Marital status: Married    Spouse name: Nathaneil Canary  . Number of children: 2  . Years of education: 20  . Highest education level: Not on file  Occupational History  . Occupation: homehealth  Social Needs  . Financial resource strain: Not on file  . Food insecurity:    Worry: Not on file    Inability: Not on file  . Transportation needs:    Medical: Not on file    Non-medical: Not on file  Tobacco Use  . Smoking status: Never Smoker  . Smokeless tobacco: Never Used  Substance and Sexual Activity  . Alcohol use: No    Alcohol/week: 0.0 standard drinks  . Drug use: No  . Sexual activity: Yes    Birth control/protection: Surgical    Comment: tubal  Lifestyle  . Physical activity:    Days per week: Not on file    Minutes per session: Not on file  . Stress: Not on file  Relationships  . Social connections:    Talks on phone: Not on file    Gets together: Not on file    Attends religious service: Not on  file    Active member of club or organization: Not on file    Attends meetings of clubs or organizations: Not on file    Relationship status: Not on file  . Intimate partner violence:    Fear of current or ex partner: Not on file    Emotionally abused: Not on file    Physically abused: Not on file    Forced sexual activity: Not on file  Other Topics Concern  . 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   Please see review of systems for further details on the patient's review from today.   Objective:   Physical Exam:  There were no vitals taken for this visit.  Physical Exam Neurological:     Mental Status: She is alert and oriented to person, place, and time.     Cranial Nerves: No dysarthria.  Psychiatric:        Attention and Perception: Attention normal.        Mood and Affect: Mood normal.        Speech: Speech normal.        Behavior: Behavior is cooperative.        Thought Content: Thought content normal. Thought content is not paranoid or delusional. Thought content does not include homicidal or suicidal ideation. Thought content does not include homicidal or suicidal plan.        Cognition and Memory: Cognition and memory normal.        Judgment: Judgment normal.     Lab Review:     Component Value Date/Time   NA 141 12/13/2018 0856   K 4.0 12/13/2018 0856   CL  103 12/13/2018 0856   CO2 21 12/13/2018 0856   GLUCOSE 102 (H) 12/13/2018 0856   GLUCOSE 102 (H) 08/22/2018 1235   BUN 10 12/13/2018 0856   CREATININE 0.75 12/13/2018 0856   CREATININE 0.64 09/01/2017 1635   CALCIUM 9.4 12/13/2018 0856   PROT 6.5 12/13/2018 0856   ALBUMIN 4.2 12/13/2018 0856   AST 18 12/13/2018 0856   ALT 20 12/13/2018 0856   ALKPHOS 80 12/13/2018 0856    BILITOT 0.2 12/13/2018 0856   GFRNONAA 103 12/13/2018 0856   GFRNONAA 116 09/01/2017 1635   GFRAA 119 12/13/2018 0856   GFRAA 134 09/01/2017 1635       Component Value Date/Time   WBC 7.7 08/22/2018 1235   RBC 4.61 08/22/2018 1235   HGB 11.9 (L) 08/22/2018 1235   HGB 12.5 06/12/2018 1022   HCT 38.8 08/22/2018 1235   HCT 37.8 06/12/2018 1022   PLT 253 08/22/2018 1235   PLT 273 06/12/2018 1022   MCV 84.2 08/22/2018 1235   MCV 82 06/12/2018 1022   MCH 25.8 (L) 08/22/2018 1235   MCHC 30.7 08/22/2018 1235   RDW 15.0 08/22/2018 1235   RDW 15.1 06/12/2018 1022   LYMPHSABS 2.0 06/12/2018 1022   MONOABS 0.5 10/09/2016 1416   EOSABS 0.1 06/12/2018 1022   BASOSABS 0.0 06/12/2018 1022    Lithium Lvl  Date Value Ref Range Status  08/22/2014 0.80 0.80 - 1.40 mEq/L Final     No results found for: PHENYTOIN, PHENOBARB, VALPROATE, CBMZ   .res Assessment: Plan:   Discussed treatment options with patient and she reports that she would like to resume methylphenidate since this has been helpful for her energy and concentration in the past.  Discussed option of changing to Concerta since it has a longer duration since she reports that methylphenidate often was only effective in the earlier part of the day.  Discussed that Concerta may require prior authorization.  Patient reports that she would rather restart methylphenidate and be able to avoid a delay in treatment if possible. Will restart methylphenidate 10 mg twice daily for attention deficit. Continue Cymbalta 60 mg daily since this has been effective for patients mood, anxiety, and possibly pain. Recommend continuing psychotherapy with Rinaldo Cloud, LCSW. Patient to follow-up with this provider in 2 to 3 months or sooner if clinically indicated.  Depression, unspecified depression type - Plan: DULoxetine (CYMBALTA) 60 MG capsule  Anxiety disorder, unspecified type - Plan: DULoxetine (CYMBALTA) 60 MG capsule  Attention deficit  hyperactivity disorder (ADHD), other type - Plan: methylphenidate (RITALIN) 10 MG tablet, methylphenidate (RITALIN) 10 MG tablet, methylphenidate (RITALIN) 10 MG tablet  Please see After Visit Summary for patient specific instructions.  Future Appointments  Date Time Provider [place]  03/14/2019 10:00 AM Shanon Ace, LCSW [facility] None  06/06/2019  8:25 AM Dettinger, Fransisca Kaufmann, MD [facility] None  06/15/2019 12:45 PM Teresa Tran, PMHNP [facility] None    No orders of the defined types were placed in this encounter.     -------------------------------

## 2019-03-14 ENCOUNTER — Other Ambulatory Visit: Payer: Self-pay

## 2019-03-14 ENCOUNTER — Encounter: Payer: Medicare Other | Admitting: Psychiatry

## 2019-03-14 NOTE — Progress Notes (Signed)
This encounter was created in error - please disregard.  This encounter was created in error - please disregard.

## 2019-03-22 ENCOUNTER — Ambulatory Visit (INDEPENDENT_AMBULATORY_CARE_PROVIDER_SITE_OTHER): Payer: Medicare Other | Admitting: Nurse Practitioner

## 2019-03-22 ENCOUNTER — Other Ambulatory Visit: Payer: Self-pay

## 2019-03-22 ENCOUNTER — Encounter: Payer: Self-pay | Admitting: Nurse Practitioner

## 2019-03-22 DIAGNOSIS — R059 Cough, unspecified: Secondary | ICD-10-CM

## 2019-03-22 DIAGNOSIS — R05 Cough: Secondary | ICD-10-CM | POA: Diagnosis not present

## 2019-03-22 MED ORDER — BENZONATATE 100 MG PO CAPS: 100.0000 mg | ORAL_CAPSULE | Freq: Three times a day (TID) | ORAL | 0 refills | Status: DC | PRN

## 2019-03-22 MED ORDER — PREDNISONE 20 MG PO TABS: ORAL_TABLET | ORAL | 0 refills | Status: DC

## 2019-03-22 NOTE — Progress Notes (Signed)
Virtual Visit via telephone Note  I connected with Teresa Tran on 03/22/19 at 1:50 PM by telephone and verified that I am speaking with the correct person using two identifiers. Teresa Tran is currently located at home and no one is currently with her during visit. The provider, Mary-Margaret Hassell Done, FNP is located in their office at time of visit.  I discussed the limitations, risks, security and privacy concerns of performing an evaluation and management service by telephone and the availability of in person appointments. I also discussed with the patient that there may be a patient responsible charge related to this service. The patient expressed understanding and agreed to proceed.   History and Present Illness:  Patient calls in today c/o chest congestion. She said that dr. Warrick Parisian treated her for pneumonia on March 23,2020. She says the last few days the cough and sob is gradually coming back. She has not been taking any medicine.   * has some autimmune disorder that they are trying to figure out what it is.  Review of Systems  Constitutional: Negative for chills and fever.  HENT: Positive for congestion.   Respiratory: Positive for cough and shortness of breath.   Cardiovascular: Negative.   Genitourinary: Negative.   Neurological: Negative.   Psychiatric/Behavioral: Negative.      Observations/Objective: Alert and oriented- answers all questions appropriately No distress noted in voice No cough during phone call.  Assessment and Plan: Teresa Tran in today with chief complaint of Nasal Congestion   1. Cough 1. Take meds as prescribed 2. Use a cool mist humidifier especially during the winter months and when heat has been humid. 3. Use saline nose sprays frequently 4. Saline irrigations of the nose can be very helpful if done frequently.  * 4X daily for 1 week*  * Use of a nettie pot can be helpful with this. Follow directions with this* 5. Drink plenty of fluids  6. Keep thermostat turn down low 7.For any cough or congestion  Use plain Mucinex- regular strength or max strength is fine   * Children- consult with Pharmacist for dosing 8. For fever or aces or pains- take tylenol or ibuprofen appropriate for age and weight.  * for fevers greater than 101 orally you may alternate ibuprofen and tylenol every  3 hours.    - predniSONE (DELTASONE) 20 MG tablet; 2 po at sametime daily for 5 days  Dispense: 10 tablet; Refill: 0 - benzonatate (TESSALON PERLES) 100 MG capsule; Take 1 capsule (100 mg total) by mouth 3 (three) times daily as needed for cough.  Dispense: 20 capsule; Refill: 0   Follow Up Instructions: prn    I discussed the assessment and treatment plan with the patient. The patient was provided an opportunity to ask questions and all were answered. The patient agreed with the plan and demonstrated an understanding of the instructions.   The patient was advised to call back or seek an in-person evaluation if the symptoms worsen or if the condition fails to improve as anticipated.  The above assessment and management plan was discussed with the patient. The patient verbalized understanding of and has agreed to the management plan. Patient is aware to call the clinic if symptoms persist or worsen. Patient is aware when to return to the clinic for a follow-up visit. Patient educated on when it is appropriate to go to the emergency department.   Time call ended:  2:00  I provided 12 minutes of non-face-to-face time  during this encounter.    Mary-Margaret Hassell Done, FNP

## 2019-03-29 DIAGNOSIS — L719 Rosacea, unspecified: Secondary | ICD-10-CM | POA: Diagnosis not present

## 2019-03-29 DIAGNOSIS — M797 Fibromyalgia: Secondary | ICD-10-CM | POA: Diagnosis not present

## 2019-03-29 DIAGNOSIS — R768 Other specified abnormal immunological findings in serum: Secondary | ICD-10-CM | POA: Diagnosis not present

## 2019-05-23 DIAGNOSIS — H0102A Squamous blepharitis right eye, upper and lower eyelids: Secondary | ICD-10-CM | POA: Diagnosis not present

## 2019-05-23 DIAGNOSIS — H04123 Dry eye syndrome of bilateral lacrimal glands: Secondary | ICD-10-CM | POA: Diagnosis not present

## 2019-05-23 DIAGNOSIS — H0102B Squamous blepharitis left eye, upper and lower eyelids: Secondary | ICD-10-CM | POA: Diagnosis not present

## 2019-05-23 DIAGNOSIS — H5712 Ocular pain, left eye: Secondary | ICD-10-CM | POA: Diagnosis not present

## 2019-05-25 DIAGNOSIS — H9203 Otalgia, bilateral: Secondary | ICD-10-CM | POA: Diagnosis not present

## 2019-05-25 DIAGNOSIS — R42 Dizziness and giddiness: Secondary | ICD-10-CM | POA: Diagnosis not present

## 2019-06-05 ENCOUNTER — Other Ambulatory Visit: Payer: Self-pay

## 2019-06-06 ENCOUNTER — Encounter: Payer: Self-pay | Admitting: Family Medicine

## 2019-06-06 ENCOUNTER — Ambulatory Visit (INDEPENDENT_AMBULATORY_CARE_PROVIDER_SITE_OTHER): Payer: Medicare Other | Admitting: Family Medicine

## 2019-06-06 VITALS — BP 119/75 | HR 81 | Temp 98.6°F | Ht 61.0 in | Wt 237.6 lb

## 2019-06-06 DIAGNOSIS — H9313 Tinnitus, bilateral: Secondary | ICD-10-CM | POA: Diagnosis not present

## 2019-06-06 DIAGNOSIS — I10 Essential (primary) hypertension: Secondary | ICD-10-CM

## 2019-06-06 DIAGNOSIS — K219 Gastro-esophageal reflux disease without esophagitis: Secondary | ICD-10-CM

## 2019-06-06 DIAGNOSIS — R7303 Prediabetes: Secondary | ICD-10-CM

## 2019-06-06 DIAGNOSIS — E559 Vitamin D deficiency, unspecified: Secondary | ICD-10-CM | POA: Diagnosis not present

## 2019-06-06 LAB — BAYER DCA HB A1C WAIVED: HB A1C (BAYER DCA - WAIVED): 6.3 % (ref <7.0)

## 2019-06-06 NOTE — Progress Notes (Signed)
BP 119/75   Pulse 81   Temp 98.6 F (37 C) (Temporal)   Ht 5' 1" (1.549 m)   Wt 237 lb 9.6 oz (107.8 kg)   BMI 44.89 kg/m    Subjective:   Patient ID: Teresa Tran, female    DOB: [birth year], 38 y.o.   MRN: [id number]  HPI: Teresa Tran is a 38 y.o. female presenting on 06/06/2019 for Gastroesophageal Reflux (6 month followup- Patient states that she has been having buzzing in her ears.) and prediabetes   HPI Prediabetes  Patient comes in today for recheck of his diabetes. Patient has been currently taking no medication currently and her monitoring, has been diet controlled for the most part but has had some recent weight gain with coronavirus. Patient is not currently on an ACE inhibitor/ARB. Patient has seen an ophthalmologist this year and has been diagnosed with optic nerve problem. Patient denies any issues with their feet.  GERD Patient is currently on Rudd.  She denies any major symptoms or abdominal pain or belching or burping. She denies any blood in her stool or lightheadedness or dizziness.    Hypertension Patient is currently on chlorthalidone, and their blood pressure today is 119/75. Patient denies any lightheadedness or dizziness. Patient denies headaches, blurred vision, chest pains, shortness of breath, or weakness. Denies any side effects from medication and is content with current medication.   Vit D deficiency Patient has history of vitamin D and is coming in for recheck today.  She is currently taking 50,000 vitamin D weekly.  She denies any major issues with it.   Ringing in ears Patient complains of hearing loss and ringing in the ears, she says the left ear is worse than the right and has been increasing over the past couple months.  She denies any cough or congestion or sinus congestion but just has mainly the ringing.  She does plan to go see an audiologist for this.  Relevant past medical, surgical, family and social history reviewed and updated as  indicated. Interim medical history since our last visit reviewed. Allergies and medications reviewed and updated.  Review of Systems  Constitutional: Negative for chills and fever.  HENT: Positive for tinnitus. Negative for congestion, ear discharge and ear pain.   Eyes: Negative for redness and visual disturbance.  Respiratory: Negative for chest tightness and shortness of breath.   Cardiovascular: Negative for chest pain and leg swelling.  Genitourinary: Negative for difficulty urinating and dysuria.  Musculoskeletal: Negative for back pain and gait problem.  Skin: Negative for rash.  Neurological: Negative for light-headedness and headaches.  Psychiatric/Behavioral: Negative for agitation and behavioral problems.  All other systems reviewed and are negative.   Per HPI unless specifically indicated above   Allergies as of 06/06/2019      Reactions   Macrobid [nitrofurantoin Monohyd Macro] Itching   Risperdal [risperidone] Other (See Comments)   Wt gain      Medication List       Accurate as of June 06, 2019 11:59 PM. If you have any questions, ask your nurse or doctor.        STOP taking these medications   amoxicillin-clavulanate 875-125 MG tablet Commonly known as: AUGMENTIN Stopped by: Fransisca Kaufmann , MD   benzonatate 100 MG capsule Commonly known as: Best boy Stopped by: Worthy Rancher, MD   predniSONE 20 MG tablet Commonly known as: Deltasone Stopped by: Worthy Rancher, MD     TAKE these medications  acetaminophen 500 MG tablet Commonly known as: TYLENOL Take 1,000 mg by mouth every 6 (six) hours as needed for mild pain or headache.   albuterol 108 (90 Base) MCG/ACT inhaler Commonly known as: VENTOLIN HFA Inhale 2 puffs into the lungs every 4 (four) hours as needed for wheezing or shortness of breath.   chlorthalidone 25 MG tablet Commonly known as: HYGROTON Take 1 tablet (25 mg total) by mouth daily.   dexlansoprazole 60 MG  capsule Commonly known as: Dexilant Take 1 capsule (60 mg total) by mouth daily before breakfast.   DULoxetine 60 MG capsule Commonly known as: CYMBALTA Take 1 capsule (60 mg total) by mouth daily for 30 days.   fluorometholone 0.1 % ophthalmic suspension Commonly known as: FML INSTILL 1 DROP INTO EACH EYE 4 TIMES DAILY. TAPER AS DIRECTED OVER 4 WEEKS   loratadine 10 MG tablet Commonly known as: CLARITIN Take 10 mg by mouth daily.   meclizine 12.5 MG tablet Commonly known as: ANTIVERT Take 12.5 mg by mouth 3 (three) times daily as needed for dizziness.   methylphenidate 10 MG tablet Commonly known as: RITALIN Take 1 tablet (10 mg total) by mouth 2 (two) times daily for 30 days.   methylphenidate 10 MG tablet Commonly known as: RITALIN Take 1 tablet (10 mg total) by mouth 2 (two) times daily for 30 days.   Vitamin D (Ergocalciferol) 1.25 MG (50000 UT) Caps capsule Commonly known as: DRISDOL Take 1 capsule (50,000 Units total) by mouth every 7 (seven) days. For 12 weeks        Objective:   BP 119/75   Pulse 81   Temp 98.6 F (37 C) (Temporal)   Ht 5' 1" (1.549 m)   Wt 237 lb 9.6 oz (107.8 kg)   BMI 44.89 kg/m   Wt Readings from Last 3 Encounters:  06/06/19 237 lb 9.6 oz (107.8 kg)  12/25/18 227 lb (103 kg)  12/13/18 223 lb (101.2 kg)    Physical Exam Vitals signs and nursing note reviewed.  Constitutional:      General: She is not in acute distress.    Appearance: She is well-developed. She is not diaphoretic.  HENT:     Right Ear: Tympanic membrane and ear canal normal.     Left Ear: Tympanic membrane and ear canal normal.     Nose: No congestion or rhinorrhea.     Mouth/Throat:     Mouth: Mucous membranes are moist.     Pharynx: No posterior oropharyngeal erythema.  Eyes:     Conjunctiva/sclera: Conjunctivae normal.  Cardiovascular:     Rate and Rhythm: Normal rate and regular rhythm.     Heart sounds: Normal heart sounds. No murmur.  Pulmonary:      Effort: Pulmonary effort is normal. No respiratory distress.     Breath sounds: Normal breath sounds. No wheezing.  Musculoskeletal: Normal range of motion.        General: No tenderness.  Skin:    General: Skin is warm and dry.     Findings: No rash.  Neurological:     Mental Status: She is alert and oriented to person, place, and time.     Coordination: Coordination normal.  Psychiatric:        Behavior: Behavior normal.       Assessment & Plan:   Problem List Items Addressed This Visit      Cardiovascular and Mediastinum   Essential hypertension   Relevant Orders   CMP14+EGFR (Completed)  Digestive   GERD (gastroesophageal reflux disease)   Relevant Medications   meclizine (ANTIVERT) 12.5 MG tablet   Other Relevant Orders   CBC with Differential/Platelet (Completed)   TSH (Completed)     Other   Vitamin D deficiency (Chronic)   Relevant Orders   TSH (Completed)   Prediabetes - Primary   Relevant Orders   CMP14+EGFR (Completed)   Lipid panel (Completed)   Bayer DCA Hb A1c Waived (Completed)    Other Visit Diagnoses    Tinnitus of both ears          Continue current medication including chlorthalidone and Dexilant and loratadine and duloxetine, she sees psychiatry for the duloxetine and the Ritalin. Follow up plan: Return in about 6 months (around 12/07/2019), or if symptoms worsen or fail to improve, for prediabetes.  Counseling provided for all of the vaccine components Orders Placed This Encounter  Procedures  . CBC with Differential/Platelet  . CMP14+EGFR  . Lipid panel  . TSH  . Bayer [facility] Hb A1c [place], MD [facility] 06/08/2019, 9:55 PM

## 2019-06-07 LAB — CMP14+EGFR
ALT: 16 IU/L (ref 0–32)
AST: 14 IU/L (ref 0–40)
Albumin/Globulin Ratio: 1.9 (ref 1.2–2.2)
Albumin: 4.3 g/dL (ref 3.8–4.8)
Alkaline Phosphatase: 79 IU/L (ref 39–117)
BUN/Creatinine Ratio: 19 (ref 9–23)
BUN: 14 mg/dL (ref 6–20)
Bilirubin Total: <0.2 mg/dL (ref 0.0–1.2)
CO2: 25 mmol/L (ref 20–29)
Calcium: 9.6 mg/dL (ref 8.7–10.2)
Chloride: 100 mmol/L (ref 96–106)
Creatinine, Ser: 0.72 mg/dL (ref 0.57–1.00)
GFR calc Af Amer: 125 mL/min/{1.73_m2} (ref >59)
GFR calc non Af Amer: 108 mL/min/{1.73_m2} (ref >59)
Globulin, Total: 2.3 g/dL (ref 1.5–4.5)
Glucose: 107 mg/dL — ABNORMAL HIGH (ref 65–99)
Potassium: 4 mmol/L (ref 3.5–5.2)
Sodium: 142 mmol/L (ref 134–144)
Total Protein: 6.6 g/dL (ref 6.0–8.5)

## 2019-06-07 LAB — CBC WITH DIFFERENTIAL/PLATELET
Basophils Absolute: 0.1 10*3/uL (ref 0.0–0.2)
Basos: 1 %
EOS (ABSOLUTE): 0.1 10*3/uL (ref 0.0–0.4)
Eos: 2 %
Hematocrit: 36 % (ref 34.0–46.6)
Hemoglobin: 11.5 g/dL (ref 11.1–15.9)
Immature Grans (Abs): 0 10*3/uL (ref 0.0–0.1)
Immature Granulocytes: 0 %
Lymphocytes Absolute: 2.1 10*3/uL (ref 0.7–3.1)
Lymphs: 28 %
MCH: 26.3 pg — ABNORMAL LOW (ref 26.6–33.0)
MCHC: 31.9 g/dL (ref 31.5–35.7)
MCV: 82 fL (ref 79–97)
Monocytes Absolute: 0.5 10*3/uL (ref 0.1–0.9)
Monocytes: 6 %
Neutrophils Absolute: 4.7 10*3/uL (ref 1.4–7.0)
Neutrophils: 63 %
Platelets: 266 10*3/uL (ref 150–450)
RBC: 4.37 x10E6/uL (ref 3.77–5.28)
RDW: 14.7 % (ref 11.7–15.4)
WBC: 7.4 10*3/uL (ref 3.4–10.8)

## 2019-06-07 LAB — LIPID PANEL
Chol/HDL Ratio: 3.9 ratio (ref 0.0–4.4)
Cholesterol, Total: 212 mg/dL — ABNORMAL HIGH (ref 100–199)
HDL: 55 mg/dL (ref >39)
LDL Calculated: 131 mg/dL — ABNORMAL HIGH (ref 0–99)
Triglycerides: 128 mg/dL (ref 0–149)
VLDL Cholesterol Cal: 26 mg/dL (ref 5–40)

## 2019-06-07 LAB — TSH: TSH: 2.5 u[IU]/mL (ref 0.450–4.500)

## 2019-06-15 ENCOUNTER — Other Ambulatory Visit: Payer: Self-pay

## 2019-06-15 ENCOUNTER — Encounter: Payer: Self-pay | Admitting: Psychiatry

## 2019-06-15 ENCOUNTER — Ambulatory Visit (INDEPENDENT_AMBULATORY_CARE_PROVIDER_SITE_OTHER): Payer: Medicare Other | Admitting: Psychiatry

## 2019-06-15 DIAGNOSIS — F419 Anxiety disorder, unspecified: Secondary | ICD-10-CM | POA: Diagnosis not present

## 2019-06-15 DIAGNOSIS — F908 Attention-deficit hyperactivity disorder, other type: Secondary | ICD-10-CM | POA: Diagnosis not present

## 2019-06-15 DIAGNOSIS — F329 Major depressive disorder, single episode, unspecified: Secondary | ICD-10-CM

## 2019-06-15 DIAGNOSIS — F32A Depression, unspecified: Secondary | ICD-10-CM

## 2019-06-15 MED ORDER — METHYLPHENIDATE HCL ER (OSM) 36 MG PO TBCR: 36.0000 mg | EXTENDED_RELEASE_TABLET | Freq: Every day | ORAL | 0 refills | Status: DC

## 2019-06-15 MED ORDER — DULOXETINE HCL 60 MG PO CPEP: 60.0000 mg | ORAL_CAPSULE | Freq: Every day | ORAL | 5 refills | Status: DC

## 2019-06-15 NOTE — Progress Notes (Signed)
Teresa Tran [id number] [birth year] 38 y.o.  Virtual Visit via Video Note  I connected with pt on 06/16/19 at 12:45 PM EDT by a video enabled telemedicine application and verified that I am speaking with the correct person using two identifiers.   I discussed the limitations of evaluation and management by telemedicine and the availability of in person appointments. The patient expressed understanding and agreed to proceed.  I discussed the assessment and treatment plan with the patient. The patient was provided an opportunity to ask questions and all were answered. The patient agreed with the plan and demonstrated an understanding of the instructions.   The patient was advised to call back or seek an in-person evaluation if the symptoms worsen or if the condition fails to improve as anticipated.  I provided 30 minutes of non-face-to-face time during this encounter.  The patient was located at home.  The provider was located at [place].   Thayer Headings, PMHNP   Subjective:   Patient ID:  Teresa Tran is a 38 y.o. (DOB [birth year]) female.  Chief Complaint:  Chief Complaint  Patient presents with  . Other    Inattention, Fatigue  . Follow-up    h/o Depression and anxiety    HPI Teresa Tran presents for follow-up of mood and anxiety. She reports that she is doing well overall and is coping with health issues. She reports that her mood is "fine. " She reports that she has some occ anxiety and feels that she is coping better overall. She reports that she has increased anxiety when she has more things to do and tries to cope by preparing as much as possible and not over-scheduling herself. She reports that she has limited energy due to physical issues. Motivation has been good. She reports that she gets tearful at times and may coincide with menstrual cycle. She reports that periods of tearfulness are brief and denies uncontrolled crying. Reports tearfulness also occurs when  grieving. Denies panic attacks. She reports that she is sleeping well overall. Reports occ difficulty falling asleep (about 3 nights in the last month). Reports that insomnia seems to occur more when she needs to be somewhere early the next day or as something different planned. She reports that it is difficulty for her to focus. She reports that Ritalin is helpful for a few hours and then second dose does not seem to be as effective. Denies SI.   Past Psychiatric Medication Trials: Rexulti- increased HA's, drowsiness Cymbalta Buspar Cerefolin Topamax Prozac Celexa Lexapro Xanax Ativan Lithium-tremors Lamictal risperdal Seroquel Wellbutrin- Ineffective, HA Phentermine Gabapentin- drowsiness Methylphenidate-Effective for concentration in the morning Propranolol- caused dizziness   Review of Systems:  Review of Systems  HENT: Positive for hearing loss.   Eyes: Positive for pain and visual disturbance.       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  Endocrine:       Reports most recent Hgb A1C was elevated. Reports that she plans to make some dietary changes.   Musculoskeletal: Positive for arthralgias. Negative for gait problem.  Neurological: Negative for tremors.  Psychiatric/Behavioral:       Please refer to HPI    Medications: I have reviewed the patient's current medications.  Current Outpatient Medications  Medication Sig Dispense Refill  . acetaminophen (TYLENOL) 500 MG tablet Take 1,000 mg by mouth every 6 (six) hours as needed for mild pain or headache.    . albuterol (  PROVENTIL HFA;VENTOLIN HFA) 108 (90 Base) MCG/ACT inhaler Inhale 2 puffs into the lungs every 4 (four) hours as needed for wheezing or shortness of breath. 1 Inhaler 2  . chlorthalidone (HYGROTON) 25 MG tablet Take 1 tablet (25 mg total) by mouth daily. 90 tablet 3  . dexlansoprazole (DEXILANT) 60 MG capsule Take 1 capsule (60 mg total) by  mouth daily before breakfast. 120 capsule 3  . fluorometholone (FML) 0.1 % ophthalmic suspension INSTILL 1 DROP INTO EACH EYE 4 TIMES DAILY. TAPER AS DIRECTED OVER 4 WEEKS    . meclizine (ANTIVERT) 12.5 MG tablet Take 12.5 mg by mouth 3 (three) times daily as needed for dizziness.    . Vitamin D, Ergocalciferol, (DRISDOL) 1.25 MG (50000 UT) CAPS capsule Take 1 capsule (50,000 Units total) by mouth every 7 (seven) days. For 12 weeks 12 capsule 1  . DULoxetine (CYMBALTA) 60 MG capsule Take 1 capsule (60 mg total) by mouth daily. 30 capsule 5  . loratadine (CLARITIN) 10 MG tablet Take 10 mg by mouth daily.    . methylphenidate (CONCERTA) 36 MG PO CR tablet Take 1 tablet (36 mg total) by mouth daily. 30 tablet 0   No current facility-administered medications for this visit.     Medication Side Effects: Other: Possible somnolence and fatigue  Allergies:  Allergies  Allergen Reactions  . Macrobid [Nitrofurantoin Monohyd Macro] Itching  . Risperdal [Risperidone] Other (See Comments)    Wt gain    Past Medical History:  Diagnosis Date  . Allergy    medicine  . Anemia   . Anxiety   . Bipolar disorder ([place])   . BV (bacterial vaginosis) 08/15/2014  . CSF leak   . Depression   . GERD (gastroesophageal reflux disease)   . Headache(784.0)   . Hypertension   . Low back pain 08/15/2014  . Lupus ([place])   . Obesity   . Ulcer   . Urinary frequency 08/30/2014  . Vaginal discharge 08/15/2014  . Vaginal irritation 02/12/2014  . Yeast infection 02/12/2014    Family History  Problem Relation Age of Onset  . Depression Father   . Anxiety disorder Father   . Diabetes Father   . Hypertension Father   . Hyperlipidemia Father   . Kidney disease Father   . Mental illness Father   . Pneumonia Father   . Stroke Father   . Depression Sister   . Anxiety disorder Sister   . Cancer Sister        cervical  . Stroke Sister   . Depression Brother   . Anxiety disorder Brother   . Cancer - Other  Paternal Grandfather           . Cancer Paternal Grandfather        prostate  . Kidney disease Mother   . Diabetes Mother   . Cancer Mother        kidney  . Hyperlipidemia Mother   . Hypertension Mother   . Stroke Mother   . Aneurysm Maternal Grandfather   . Cancer Maternal Grandmother 46       breast  . Cancer Paternal Grandmother        bladder  . ADD / ADHD Neg Hx   . Alcohol abuse Neg Hx   . Drug abuse Neg Hx   . Bipolar disorder Neg Hx   . Dementia Neg Hx   . OCD Neg Hx   . Paranoid behavior Neg Hx   . Schizophrenia Neg Hx   .  Seizures Neg Hx   . Sexual abuse Neg Hx   . Physical abuse Neg Hx   . Colon cancer Neg Hx     Social History   Socioeconomic History  . Marital status: Married    Spouse name: Nathaneil Canary  . Number of children: 2  . Years of education: 82  . Highest education level: Not on file  Occupational History  . Occupation: homehealth  Social Needs  . Financial resource strain: Not on file  . Food insecurity    Worry: Not on file    Inability: Not on file  . Transportation needs    Medical: Not on file    Non-medical: Not on file  Tobacco Use  . Smoking status: Never Smoker  . Smokeless tobacco: Never Used  Substance and Sexual Activity  . Alcohol use: No    Alcohol/week: 0.0 standard drinks  . Drug use: No  . Sexual activity: Yes    Birth control/protection: Surgical    Comment: tubal  Lifestyle  . Physical activity    Days per week: Not on file    Minutes per session: Not on file  . Stress: Not on file  Relationships  . Social Herbalist on phone: Not on file    Gets together: Not on file    Attends religious service: Not on file    Active member of club or organization: Not on file    Attends meetings of clubs or organizations: Not on file    Relationship status: Not on file  . Intimate partner violence    Fear of current or ex partner: Not on file    Emotionally abused: Not on file    Physically abused: Not on file     Forced sexual activity: Not on file  Other Topics Concern  . 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   Please see review of systems for further details on the patient's review from today.   Objective:   Physical Exam:  There were no vitals taken for this visit.  Physical Exam Neurological:     Mental Status: She is alert and oriented to person, place, and time.     Cranial Nerves: No dysarthria.  Psychiatric:        Attention and Perception: Attention normal.        Mood and Affect: Mood normal.        Speech: Speech normal.        Behavior: Behavior is cooperative.        Thought Content: Thought content normal. Thought content is not paranoid or delusional. Thought content does not include homicidal or suicidal ideation. Thought content does not include homicidal or suicidal plan.        Cognition and Memory: Cognition and memory normal. She does not exhibit impaired recent memory.        Judgment: Judgment normal.     Lab Review:     Component Value Date/Time   NA 142 06/06/2019 0924   K 4.0 06/06/2019 0924   CL 100 06/06/2019 0924   CO2 25 06/06/2019 0924   GLUCOSE 107 (H) 06/06/2019 0924   GLUCOSE 102 (H) 08/22/2018 1235   BUN 14 06/06/2019 0924  CREATININE 0.72 06/06/2019 0924   CREATININE 0.64 09/01/2017 1635   CALCIUM 9.6 06/06/2019 0924   PROT 6.6 06/06/2019 0924   ALBUMIN 4.3 06/06/2019 0924   AST 14 06/06/2019 0924   ALT 16 06/06/2019 0924   ALKPHOS 79 06/06/2019 0924   BILITOT <0.2 06/06/2019 0924   GFRNONAA 108 06/06/2019 0924   GFRNONAA 116 09/01/2017 1635   GFRAA 125 06/06/2019 0924   GFRAA 134 09/01/2017 1635       Component Value Date/Time   WBC 7.4 06/06/2019 0924   WBC 7.7 08/22/2018 1235   RBC 4.37  06/06/2019 0924   RBC 4.61 08/22/2018 1235   HGB 11.5 06/06/2019 0924   HCT 36.0 06/06/2019 0924   PLT 266 06/06/2019 0924   MCV 82 06/06/2019 0924   MCH 26.3 (L) 06/06/2019 0924   MCH 25.8 (L) 08/22/2018 1235   MCHC 31.9 06/06/2019 0924   MCHC 30.7 08/22/2018 1235   RDW 14.7 06/06/2019 0924   LYMPHSABS 2.1 06/06/2019 0924   MONOABS 0.5 10/09/2016 1416   EOSABS 0.1 06/06/2019 0924   BASOSABS 0.1 06/06/2019 0924    Lithium Lvl  Date Value Ref Range Status  08/22/2014 0.80 0.80 - 1.40 mEq/L Final     No results found for: PHENYTOIN, PHENOBARB, VALPROATE, CBMZ   .res Assessment: Plan:   Discussed potential benefits, risks, and side effects of changing Methylphenidate IR to Concerta due to longer duration since pt reports that Methylphenidate IR is only effective for a few hours. Pt agrees to trial of Concerta. Pt advised to contact office to discuss if Concerta is effective and will then send additional scripts based upon response and tolerability.  Will continue all other medications as prescribed. Recommend continuing psychotherapy with Rinaldo Cloud, LCSW. Pt to f/u in 3 months or sooner if clinically indicated.  Patient advised to contact office with any questions, adverse effects, or acute worsening in signs and symptoms.  Teresa Tran was seen today for other and follow-up.  Diagnoses and all orders for this visit:  Attention deficit hyperactivity disorder (ADHD), other type -     methylphenidate (CONCERTA) 36 MG PO CR tablet; Take 1 tablet (36 mg total) by mouth daily.  Anxiety disorder, unspecified type -     DULoxetine (CYMBALTA) 60 MG capsule; Take 1 capsule (60 mg total) by mouth daily.  Depression, unspecified depression type -     DULoxetine (CYMBALTA) 60 MG capsule; Take 1 capsule (60 mg total) by mouth daily.     Please see After Visit Summary for patient specific instructions.  Future Appointments  Date Time Provider [place]  07/06/2019 12:00 PM Shanon Ace, [place] [facility] None  09/18/2019  1:00 PM Thayer Headings, PMHNP [facility] None  12/07/2019  8:25 AM Detting[place], Fransisca Kaufmann, MD [facility] None    No orders of the defined types were placed in this encounter.     -------------------------------

## 2019-06-21 ENCOUNTER — Telehealth: Payer: Self-pay

## 2019-06-21 NOTE — Telephone Encounter (Signed)
Prior authorization approved for Methylphenidate ER 36 mg #30 through Covermymeds with [employer] effective 11/08/2018-11/08/2019

## 2019-06-22 ENCOUNTER — Telehealth: Payer: Self-pay | Admitting: Family Medicine

## 2019-06-22 NOTE — Telephone Encounter (Signed)
Is there a reason she was not able to go before, if she has not had any related covid symptoms then I am fine with her going to gym and doing a note for her to go

## 2019-06-22 NOTE — Telephone Encounter (Signed)
Letter ready.  Patient aware

## 2019-06-28 ENCOUNTER — Ambulatory Visit (INDEPENDENT_AMBULATORY_CARE_PROVIDER_SITE_OTHER): Payer: Medicare Other | Admitting: Otolaryngology

## 2019-06-28 DIAGNOSIS — J310 Chronic rhinitis: Secondary | ICD-10-CM | POA: Diagnosis not present

## 2019-06-28 DIAGNOSIS — R0982 Postnasal drip: Secondary | ICD-10-CM

## 2019-06-28 DIAGNOSIS — H9313 Tinnitus, bilateral: Secondary | ICD-10-CM | POA: Diagnosis not present

## 2019-06-28 DIAGNOSIS — H903 Sensorineural hearing loss, bilateral: Secondary | ICD-10-CM | POA: Diagnosis not present

## 2019-07-05 DIAGNOSIS — H04123 Dry eye syndrome of bilateral lacrimal glands: Secondary | ICD-10-CM | POA: Diagnosis not present

## 2019-07-05 DIAGNOSIS — H5712 Ocular pain, left eye: Secondary | ICD-10-CM | POA: Diagnosis not present

## 2019-07-05 DIAGNOSIS — H16232 Neurotrophic keratoconjunctivitis, left eye: Secondary | ICD-10-CM | POA: Diagnosis not present

## 2019-07-06 ENCOUNTER — Ambulatory Visit: Payer: Medicare Other | Admitting: Psychiatry

## 2019-07-09 ENCOUNTER — Ambulatory Visit (INDEPENDENT_AMBULATORY_CARE_PROVIDER_SITE_OTHER): Payer: Medicare Other | Admitting: *Deleted

## 2019-07-09 ENCOUNTER — Other Ambulatory Visit: Payer: Self-pay

## 2019-07-09 DIAGNOSIS — Z23 Encounter for immunization: Secondary | ICD-10-CM | POA: Diagnosis not present

## 2019-08-06 ENCOUNTER — Other Ambulatory Visit: Payer: Self-pay

## 2019-08-06 ENCOUNTER — Telehealth: Payer: Self-pay | Admitting: Psychiatry

## 2019-08-06 DIAGNOSIS — F908 Attention-deficit hyperactivity disorder, other type: Secondary | ICD-10-CM

## 2019-08-06 MED ORDER — METHYLPHENIDATE HCL ER (OSM) 36 MG PO TBCR: 36.0000 mg | EXTENDED_RELEASE_TABLET | Freq: Every day | ORAL | 0 refills | Status: DC

## 2019-08-06 NOTE — Telephone Encounter (Signed)
Pended for approval.

## 2019-08-06 NOTE — Telephone Encounter (Signed)
Pt requesting a refill on her Concerta 36 mg. Fill at the [facility] in [place].

## 2019-08-28 ENCOUNTER — Ambulatory Visit
Admission: EM | Admit: 2019-08-28 | Discharge: 2019-08-28 | Disposition: A | Discharge status: Home / Self Care | Payer: Medicare Other | Attending: Emergency Medicine | Admitting: Emergency Medicine

## 2019-08-28 ENCOUNTER — Other Ambulatory Visit: Payer: Self-pay

## 2019-08-28 DIAGNOSIS — Z20822 Contact with and (suspected) exposure to covid-19: Secondary | ICD-10-CM

## 2019-08-28 DIAGNOSIS — J028 Acute pharyngitis due to other specified organisms: Secondary | ICD-10-CM

## 2019-08-28 DIAGNOSIS — J020 Streptococcal pharyngitis: Secondary | ICD-10-CM

## 2019-08-28 DIAGNOSIS — Z20828 Contact with and (suspected) exposure to other viral communicable diseases: Secondary | ICD-10-CM

## 2019-08-28 DIAGNOSIS — J029 Acute pharyngitis, unspecified: Secondary | ICD-10-CM

## 2019-08-28 LAB — POCT RAPID STREP A (OFFICE): Rapid Strep A Screen: NEGATIVE

## 2019-08-28 MED ORDER — FLUCONAZOLE 200 MG PO TABS: ORAL_TABLET | ORAL | 0 refills | Status: DC

## 2019-08-28 MED ORDER — AMOXICILLIN 500 MG PO CAPS: 500.0000 mg | ORAL_CAPSULE | Freq: Three times a day (TID) | ORAL | 0 refills | Status: DC

## 2019-08-28 NOTE — ED Provider Notes (Signed)
[facility]   [id number] 08/28/19 Arrival Time: 1758  [id number] THROAT  SUBJECTIVE: History from: patient.  Teresa Tran is a 38 y.o. female who presents with abrupt onset of sore throat for 4 days.  Denies to sick exposure to COVID, strep, flu or mono, or precipitating event.  Has tried OTC medications without relief.  Symptoms are made worse with swallowing, but tolerating liquids and own secretions without difficulty. Reports previous symptoms in the past related to strep throat.   Denies fever, chills, fatigue, ear pain, sinus pain, rhinorrhea, nasal congestion, cough, SOB, wheezing, chest pain, nausea, rash, changes in bowel or bladder habits.    ROS: As per HPI.  All other pertinent ROS negative.     Past Medical History:  Diagnosis Date  . Allergy    medicine  . Anemia   . Anxiety   . Bipolar disorder ([place])   . BV (bacterial vaginosis) 08/15/2014  . CSF leak   . Depression   . GERD (gastroesophageal reflux disease)   . Headache(784.0)   . Hypertension   . Low back pain 08/15/2014  . Lupus ([place])   . Obesity   . Ulcer   . Urinary frequency 08/30/2014  . Vaginal discharge 08/15/2014  . Vaginal irritation 02/12/2014  . Yeast infection 02/12/2014   Past Surgical History:  Procedure Laterality Date  . CHOLECYSTECTOMY  Feb 2015   Dr. Ladona Horns, [place]  . ENDOSCOPIC CONCHA BULLOSA RESECTION Left 07/20/2016   Procedure: ENDOSCOPIC LEFT  CONCHA BULLOSA RESECTION;  Surgeon: Leta Baptist, MD;  Location: [facility];  Service: ENT;  Laterality: Left;  . 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 ESOPHAGOGASTRODUODENOSCOPY N/A 05/06/2015   Procedure: ESOPHAGOGASTRODUODENOSCOPY (EGD);  Surgeon: Danie Binder, MD;  [facility];  Service: Endoscopy;  Laterality: N/A;  1300 - moved to 1:15 - office to notify  . ESOPHAGOGASTRODUODENOSCOPY (EGD) WITH ESOPHAGEAL DILATION N/A  02/11/2014   Dr. 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 EXCISION CHONCHA BULLOSA N/A 07/20/2016   Procedure: NASAL SEPTOPLASTY WITH BILATERAL TURBINATE REDUCTION;  Surgeon: Leta Baptist, MD;  Location: [facility];  Service: ENT;  Laterality: N/A;  . RHINOPLASTY    . SAVORY DILATION N/A 05/06/2015   Procedure: SAVORY DILATION;  Surgeon: Danie Binder, MD;  [facility];  Service: Endoscopy;  Laterality: N/A;  . TUBAL LIGATION     Allergies  Allergen Reactions  . Macrobid [Nitrofurantoin Monohyd Macro] Itching  . Risperdal [Risperidone] Other (See Comments)    Wt gain   No current facility-administered medications on file prior to encounter.    Current Outpatient Medications on File Prior to Encounter  Medication Sig Dispense Refill  . acetaminophen (TYLENOL) 500 MG tablet Take 1,000 mg by mouth every 6 (six) hours as needed for mild pain or headache.    . albuterol (PROVENTIL HFA;VENTOLIN HFA) 108 (90 Base) MCG/ACT inhaler Inhale 2 puffs into the lungs every 4 (four) hours as needed for wheezing or shortness of breath. 1 Inhaler 2  . chlorthalidone (HYGROTON) 25 MG tablet Take 1 tablet (25 mg total) by mouth daily. 90 tablet 3  . dexlansoprazole (DEXILANT) 60 MG capsule Take 1 capsule (60 mg total) by mouth daily before breakfast. 120 capsule 3  . DULoxetine (CYMBALTA) 60 MG capsule Take 1 capsule (60 mg total) by mouth daily. 30 capsule 5  . fluorometholone (FML) 0.1 % ophthalmic suspension  INSTILL 1 DROP INTO EACH EYE 4 TIMES DAILY. TAPER AS DIRECTED OVER 4 WEEKS    . loratadine (CLARITIN) 10 MG tablet Take 10 mg by mouth daily.    . meclizine (ANTIVERT) 12.5 MG tablet Take 12.5 mg by mouth 3 (three) times daily as needed for dizziness.    . methylphenidate (CONCERTA) 36 MG PO CR tablet Take 1 tablet (36 mg total) by mouth daily. 30 tablet 0  . OXERVATE 0.002 % SOLN     . Vitamin D, Ergocalciferol, (DRISDOL) 1.25 MG (50000 UT)  CAPS capsule Take 1 capsule (50,000 Units total) by mouth every 7 (seven) days. For 12 weeks 12 capsule 1   Social History   Socioeconomic History  . Marital status: Married    Spouse name: Nathaneil Canary  . Number of children: 2  . Years of education: 50  . Highest education level: Not on file  Occupational History  . Occupation: homehealth  Social Needs  . Financial resource strain: Not on file  . Food insecurity    Worry: Not on file    Inability: Not on file  . Transportation needs    Medical: Not on file    Non-medical: Not on file  Tobacco Use  . Smoking status: Never Smoker  . Smokeless tobacco: Never Used  Substance and Sexual Activity  . Alcohol use: No    Alcohol/week: 0.0 standard drinks  . Drug use: No  . Sexual activity: Yes    Birth control/protection: Surgical    Comment: tubal  Lifestyle  . Physical activity    Days per week: Not on file    Minutes per session: Not on file  . Stress: Not on file  Relationships  . Social Herbalist on phone: Not on file    Gets together: Not on file    Attends religious service: Not on file    Active member of club or organization: Not on file    Attends meetings of clubs or organizations: Not on file    Relationship status: Not on file  . Intimate partner violence    Fear of current or ex partner: Not on file    Emotionally abused: Not on file    Physically abused: Not on file    Forced sexual activity: Not on file  Other Topics Concern  . 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 Depression Father   . Anxiety disorder Father   . Diabetes Father   . Hypertension Father   . Hyperlipidemia Father   . Kidney disease Father   . Mental illness Father   . Pneumonia Father   . Stroke Father   . Depression Sister   . Anxiety disorder Sister   .  Cancer Sister        cervical  . Stroke Sister   . Depression Brother   . Anxiety disorder Brother   . Cancer - Other Paternal Grandfather           . Cancer Paternal Grandfather        prostate  . Kidney disease Mother   . Diabetes Mother   . Cancer Mother        kidney  . Hyperlipidemia Mother   . Hypertension Mother   . Stroke Mother   .  Aneurysm Maternal Grandfather   . Cancer Maternal Grandmother 48       breast  . Cancer Paternal Grandmother        bladder  . ADD / ADHD Neg Hx   . Alcohol abuse Neg Hx   . Drug abuse Neg Hx   . Bipolar disorder Neg Hx   . Dementia Neg Hx   . OCD Neg Hx   . Paranoid behavior Neg Hx   . Schizophrenia Neg Hx   . Seizures Neg Hx   . Sexual abuse Neg Hx   . Physical abuse Neg Hx   . Colon cancer Neg Hx     OBJECTIVE:  Vitals:   08/28/19 1805  BP: 135/84  Pulse: (!) 110  Resp: 18  Temp: 99 F (37.2 C)  TempSrc: Oral  SpO2: 98%     General appearance: alert; appears mildly fatigued, but nontoxic, speaking in full sentences and managing own secretions HEENT: NCAT; Ears: EACs clear, TMs pearly gray with visible cone of light, without erythema; Eyes: PERRL, EOMI grossly; Nose: no obvious rhinorrhea; Throat: oropharynx clear, tonsils 2+ and mildly erythematous WITH white tonsillar exudates, uvula midline Neck: supple without LAD Lungs: CTA bilaterally without adventitious breath sounds; cough absent Heart: regular rate and rhythm.  Radial pulses 2+ symmetrical bilaterally Skin: warm and dry Psychological: alert and cooperative; normal mood and affect  LABS: Results for orders placed or performed during the hospital encounter of 08/28/19 (from the past 24 hour(s))  POCT rapid strep A     Status: None   Collection Time: 08/28/19  6:18 PM  Result Value Ref Range   Rapid Strep A Screen Negative Negative     ASSESSMENT & PLAN:  1. Sore throat   2. Acute pharyngitis due to other specified organisms   3. Suspected COVID-19 virus  infection   4. Streptococcal sore throat     Meds ordered this encounter  Medications  . amoxicillin (AMOXIL) 500 MG capsule    Sig: Take 1 capsule (500 mg total) by mouth 3 (three) times daily.    Dispense:  21 capsule    Refill:  0    Order Specific Question:   Supervising Provider    Answer:   Raylene Everts [id number]  . fluconazole (DIFLUCAN) 200 MG tablet    Sig: Take one dose by mouth, wait 72 hours, and then take second dose by mouth    Dispense:  2 tablet    Refill:  0    Order Specific Question:   Supervising Provider    Answer:   Raylene Everts [id number]    Strep test negative.  We will send out to culture and notify you of abnormal results.   COVID test ordered.    In the meantime: You should remain isolated in your home for 10 days from symptom onset AND greater than 72 hours after symptoms resolution (absence of fever without the use of fever-reducing medication and improvement in respiratory symptoms), whichever is longer Get plenty of rest and push fluids Amoxicillin prescribed.  Take as directed and to completed Use OTC medications like ibuprofen or tylenol as needed fever or pain Call or go to the ED if you have any new or worsening symptoms such as fever, worsening cough, shortness of breath, chest tightness, chest pain, turning blue, changes in mental status, etc...   Diflucan also sent in for possible yeast infection secondary to antibiotic use.    Reviewed expectations re: course of current medical  issues. Questions answered. Outlined signs and symptoms indicating need for more acute intervention. Patient verbalized understanding. After Visit Summary given.        Lestine Box, PA-C 08/29/19 1009

## 2019-08-28 NOTE — ED Triage Notes (Signed)
Pt presents with sore throat that she has had for 3 days, pt has h/o frequent strep

## 2019-08-28 NOTE — Discharge Instructions (Addendum)
Strep test negative.  We will send out to culture and notify you of abnormal results.   Declines COVID testing.    In the meantime: You should remain isolated in your home for 10 days from symptom onset AND greater than 72 hours after symptoms resolution (absence of fever without the use of fever-reducing medication and improvement in respiratory symptoms), whichever is longer Get plenty of rest and push fluids Amoxicillin prescribed.  Take as directed and to completed Use OTC medications like ibuprofen or tylenol as needed fever or pain Call or go to the ED if you have any new or worsening symptoms such as fever, worsening cough, shortness of breath, chest tightness, chest pain, turning blue, changes in mental status, etc..Marland Kitchen

## 2019-08-30 LAB — NOVEL CORONAVIRUS, NAA: SARS-CoV-2, NAA: NOT DETECTED

## 2019-08-31 LAB — CULTURE, GROUP A STREP (THRC)

## 2019-09-06 ENCOUNTER — Ambulatory Visit (INDEPENDENT_AMBULATORY_CARE_PROVIDER_SITE_OTHER): Payer: Medicare Other | Admitting: Family Medicine

## 2019-09-06 ENCOUNTER — Encounter: Payer: Self-pay | Admitting: Family Medicine

## 2019-09-06 DIAGNOSIS — J0301 Acute recurrent streptococcal tonsillitis: Secondary | ICD-10-CM

## 2019-09-06 NOTE — Progress Notes (Signed)
Virtual Visit via telephone Note  I connected with Teresa Tran on 09/06/19 at 1306 by telephone and verified that I am speaking with the correct person using two identifiers. Teresa Tran is currently located at home and no other people are currently with her during visit. The provider, Fransisca Kaufmann Voncille Simm, MD is located in their office at time of visit.  Call ended at 1316  I discussed the limitations, risks, security and privacy concerns of performing an evaluation and management service by telephone and the availability of in person appointments. I also discussed with the patient that there may be a patient responsible charge related to this service. The patient expressed understanding and agreed to proceed.   History and Present Illness: Patient is calling in with recurrent strep pharyngitis and she was treated with amoxicillin again and it improved and she is concerned about the recurrence. She would like to go back to Dr Melene Plan. She is concerned because of how many times she has dealt with this and because she is a low her immune status.  She was treated with amoxicillin and is now completely over it but is just concerned because of the recurrence  No diagnosis found.  Outpatient Encounter Medications as of 09/06/2019  Medication Sig  . acetaminophen (TYLENOL) 500 MG tablet Take 1,000 mg by mouth every 6 (six) hours as needed for mild pain or headache.  . albuterol (PROVENTIL HFA;VENTOLIN HFA) 108 (90 Base) MCG/ACT inhaler Inhale 2 puffs into the lungs every 4 (four) hours as needed for wheezing or shortness of breath.  Marland Kitchen amoxicillin (AMOXIL) 500 MG capsule Take 1 capsule (500 mg total) by mouth 3 (three) times daily.  . chlorthalidone (HYGROTON) 25 MG tablet Take 1 tablet (25 mg total) by mouth daily.  Marland Kitchen dexlansoprazole (DEXILANT) 60 MG capsule Take 1 capsule (60 mg total) by mouth daily before breakfast.  . DULoxetine (CYMBALTA) 60 MG capsule Take 1 capsule (60 mg total) by mouth daily.   . fluconazole (DIFLUCAN) 200 MG tablet Take one dose by mouth, wait 72 hours, and then take second dose by mouth  . fluorometholone (FML) 0.1 % ophthalmic suspension INSTILL 1 DROP INTO EACH EYE 4 TIMES DAILY. TAPER AS DIRECTED OVER 4 WEEKS  . loratadine (CLARITIN) 10 MG tablet Take 10 mg by mouth daily.  . meclizine (ANTIVERT) 12.5 MG tablet Take 12.5 mg by mouth 3 (three) times daily as needed for dizziness.  . methylphenidate (CONCERTA) 36 MG PO CR tablet Take 1 tablet (36 mg total) by mouth daily.  Marland Kitchen OXERVATE 0.002 % SOLN   . Vitamin D, Ergocalciferol, (DRISDOL) 1.25 MG (50000 UT) CAPS capsule Take 1 capsule (50,000 Units total) by mouth every 7 (seven) days. For 12 weeks   No facility-administered encounter medications on file as of 09/06/2019.     Review of Systems  Constitutional: Negative for chills and fever.  HENT: Negative for congestion, ear discharge, ear pain, sinus pressure, sneezing, sore throat and voice change.   Eyes: Negative for redness and visual disturbance.  Respiratory: Negative for chest tightness and shortness of breath.   Cardiovascular: Negative for chest pain and leg swelling.  Musculoskeletal: Negative for back pain and gait problem.  Skin: Negative for rash.  Neurological: Negative for light-headedness and headaches.  Psychiatric/Behavioral: Negative for agitation and behavioral problems.  All other systems reviewed and are negative.   Observations/Objective: Patient sounds comfortable and in no acute distress  Assessment and Plan: Problem List Items Addressed This Visit  None    Visit Diagnoses    Recurrent streptococcal tonsillitis    -  Primary   Relevant Orders   Ambulatory referral to ENT       Follow Up Instructions: Follow-up as needed, already has a routine checkup in January    I discussed the assessment and treatment plan with the patient. The patient was provided an opportunity to ask questions and all were answered. The patient  agreed with the plan and demonstrated an understanding of the instructions.   The patient was advised to call back or seek an in-person evaluation if the symptoms worsen or if the condition fails to improve as anticipated.  The above assessment and management plan was discussed with the patient. The patient verbalized understanding of and has agreed to the management plan. Patient is aware to call the clinic if symptoms persist or worsen. Patient is aware when to return to the clinic for a follow-up visit. Patient educated on when it is appropriate to go to the emergency department.    I provided 10 minutes of non-face-to-face time during this encounter.    Worthy Rancher, MD

## 2019-09-10 ENCOUNTER — Telehealth: Payer: Self-pay | Admitting: Psychiatry

## 2019-09-10 ENCOUNTER — Other Ambulatory Visit: Payer: Self-pay

## 2019-09-10 DIAGNOSIS — F908 Attention-deficit hyperactivity disorder, other type: Secondary | ICD-10-CM

## 2019-09-10 MED ORDER — METHYLPHENIDATE HCL ER (OSM) 36 MG PO TBCR: 36.0000 mg | EXTENDED_RELEASE_TABLET | Freq: Every day | ORAL | 0 refills | Status: DC

## 2019-09-10 NOTE — Telephone Encounter (Signed)
Pt requested refill for Concerta @ [facility] in [place] in file. Appt 11/10. Ran out today.

## 2019-09-10 NOTE — Telephone Encounter (Signed)
Pended for approval. Has appt 11/10

## 2019-09-11 ENCOUNTER — Telehealth: Payer: Self-pay | Admitting: Family Medicine

## 2019-09-11 ENCOUNTER — Other Ambulatory Visit: Payer: Self-pay | Admitting: Physician Assistant

## 2019-09-11 MED ORDER — FLUCONAZOLE 150 MG PO TABS: 150.0000 mg | ORAL_TABLET | Freq: Once | ORAL | 0 refills | Status: AC

## 2019-09-11 NOTE — Telephone Encounter (Signed)
sent 

## 2019-09-11 NOTE — Telephone Encounter (Signed)
Patient states she has vaginal itching and discharge. Patient is on abx. She needs Diflucan. Patient uses [facility] in [place].

## 2019-09-11 NOTE — Progress Notes (Signed)
Teresa Tran

## 2019-09-14 ENCOUNTER — Ambulatory Visit (INDEPENDENT_AMBULATORY_CARE_PROVIDER_SITE_OTHER): Payer: Medicare Other | Admitting: Adult Health

## 2019-09-14 ENCOUNTER — Encounter: Payer: Self-pay | Admitting: Adult Health

## 2019-09-14 ENCOUNTER — Other Ambulatory Visit: Payer: Self-pay

## 2019-09-14 VITALS — BP 120/79 | HR 91 | Ht 60.0 in | Wt 209.6 lb

## 2019-09-14 DIAGNOSIS — N898 Other specified noninflammatory disorders of vagina: Secondary | ICD-10-CM | POA: Insufficient documentation

## 2019-09-14 DIAGNOSIS — B3731 Acute candidiasis of vulva and vagina: Secondary | ICD-10-CM

## 2019-09-14 DIAGNOSIS — Z8619 Personal history of other infectious and parasitic diseases: Secondary | ICD-10-CM

## 2019-09-14 DIAGNOSIS — B373 Candidiasis of vulva and vagina: Secondary | ICD-10-CM | POA: Diagnosis not present

## 2019-09-14 LAB — POCT WET PREP (WET MOUNT)

## 2019-09-14 LAB — POCT URINALYSIS DIPSTICK
Blood, UA: NEGATIVE
Glucose, UA: NEGATIVE
Leukocytes, UA: NEGATIVE
Nitrite, UA: NEGATIVE
Protein, UA: NEGATIVE

## 2019-09-14 MED ORDER — VALACYCLOVIR HCL 1 G PO TABS: 1000.0000 mg | ORAL_TABLET | Freq: Two times a day (BID) | ORAL | 1 refills | Status: DC

## 2019-09-14 MED ORDER — TERCONAZOLE 0.4 % VA CREA: 1.0000 | TOPICAL_CREAM | Freq: Every day | VAGINAL | 0 refills | Status: DC

## 2019-09-14 NOTE — Progress Notes (Signed)
  Subjective:     Patient ID: Teresa Tran, female   DOB: February 15, 1981, 38 y.o.   MRN: [id number]  HPI Teresa Tran is a 38 year old white female, married, G2P1102 in complaining of ?lump in vagina and feels irritated, burns and some itching, had been on amoxicillin and took diflucan. PCP is Dr Warrick Parisian.  Review of Systems ? Lump in vagina Has vaginal burning and irritation   Reviewed past medical,surgical, social and family history. Reviewed medications and allergies.     Objective:   Physical Exam BP 120/79 (BP Location: Left Arm, Patient Position: Sitting, Cuff Size: Normal)   Pulse 91   Ht 5' (1.524 m)   Wt 209 lb 9.6 oz (95.1 kg)   LMP 08/14/2019 (Approximate)   BMI 40.93 kg/m urine dipstick is negative.  Skin warm and dry.Pelvic: external genitalia is normal in appearance no lesions, vagina: scan white discharge without odor,has red side walls,urethra has no lesions or masses noted, cervix:smooth and bulbous, uterus: normal size, shape and contour, non tender, no masses felt, adnexa: no masses or tenderness noted. Bladder is non tender and no masses felt. Wet prep: + for yeast  Fall risk is low PHQ 2 score is 0. Examination chaperoned by Rolena Infante LPN.    Assessment:     1. Vaginal irritation   2. Yeast vaginitis   3. 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 terconazole (TERAZOL 7) 0.4 % vaginal cream    Sig: Place 1 applicator vaginally at bedtime.    Dispense:  45 g    Refill:  0    Order Specific Question:   Supervising Provider    Answer:   EURE, LUTHER H [[id number]]  . valACYclovir (VALTREX) 1000 MG tablet    Sig: Take 1 tablet (1,000 mg total) by mouth 2 (two) times daily.    Dispense:  20 tablet    Refill:  1    Order Specific Question:   Supervising Provider    Answer:   Tania Ade H [[id number]]  Follow up prn

## 2019-09-18 ENCOUNTER — Encounter: Payer: Self-pay | Admitting: Psychiatry

## 2019-09-18 ENCOUNTER — Ambulatory Visit (INDEPENDENT_AMBULATORY_CARE_PROVIDER_SITE_OTHER): Payer: Medicare Other | Admitting: Psychiatry

## 2019-09-18 ENCOUNTER — Other Ambulatory Visit: Payer: Self-pay

## 2019-09-18 DIAGNOSIS — F329 Major depressive disorder, single episode, unspecified: Secondary | ICD-10-CM | POA: Diagnosis not present

## 2019-09-18 DIAGNOSIS — F419 Anxiety disorder, unspecified: Secondary | ICD-10-CM

## 2019-09-18 DIAGNOSIS — F908 Attention-deficit hyperactivity disorder, other type: Secondary | ICD-10-CM | POA: Diagnosis not present

## 2019-09-18 DIAGNOSIS — F32A Depression, unspecified: Secondary | ICD-10-CM

## 2019-09-18 MED ORDER — METHYLPHENIDATE HCL ER (OSM) 36 MG PO TBCR: 36.0000 mg | EXTENDED_RELEASE_TABLET | Freq: Every day | ORAL | 0 refills | Status: DC

## 2019-09-18 MED ORDER — DULOXETINE HCL 60 MG PO CPEP: 60.0000 mg | ORAL_CAPSULE | Freq: Every day | ORAL | 5 refills | Status: DC

## 2019-09-18 NOTE — Progress Notes (Signed)
[name] [birth year] 38 y.o.  Virtual Visit via Video Note  I connected with pt @ on 09/18/19 at  1:00 PM EST by a video enabled telemedicine application and verified that I am speaking with the correct person using two identifiers.   I discussed the limitations of evaluation and management by telemedicine and the availability of in person appointments. The patient expressed understanding and agreed to proceed.  I discussed the assessment and treatment plan with the patient. The patient was provided an opportunity to ask questions and all were answered. The patient agreed with the plan and demonstrated an understanding of the instructions.   The patient was advised to call back or seek an in-person evaluation if the symptoms worsen or if the condition fails to improve as anticipated.  I provided 30 minutes of non-face-to-face time during this encounter.  The patient was located at home.  The provider was located at [place].   Thayer Headings, PMHNP   Subjective:   Patient ID:  Teresa Tran is a 38 y.o. (DOB [birth year]) female.  Chief Complaint:  Chief Complaint  Patient presents with  . Follow-up    ADHD, Anxiety, Depression    HPI Teresa Tran presents for follow-up of ADD, Depression, and anxiety. She reports that there were 2 days that she ran out of Concerta and noticed a considerable difference and felt tired, nauseous, and HA. Reports Concerta has been effective for her concentration and energy. She reports that she has to make lists to keep herself on track to avoid starting something else before she is completed the task at hand. She reports that duration of action is longer than Methylphenidate and is effective throughout the day. She reports that she has had increased stress over the last month and has had some anxiety. Notices more anxiety and feeling "more emotional" around her menstrual cycle. Reports that she notices she misses her mother more as  Christmas approaches and occasionally experiences tearfulness with these memories. Will also become tearful in response to stress. Denies persistent sad mood or depression. Reports that she has been sleeping well at night. Appetite has decreased slightly.  Has had 30 lbs intentional weight loss since July. Motivation has been adequate. Denies SI.   Has been adjusting to new eye treatment q 2 hours.   Past Psychiatric Medication Trials: Rexulti- increased HA's, drowsiness Cymbalta Buspar Cerefolin Topamax Prozac Celexa Lexapro Xanax Ativan Lithium-tremors Lamictal risperdal Seroquel Wellbutrin- Ineffective, HA Phentermine Gabapentin- drowsiness Methylphenidate-Effective for concentration in the morning Propranolol- caused dizziness  Review of Systems:  Review of Systems  HENT:       Recurrent strep infections  Eyes: Positive for visual disturbance.  Cardiovascular: Negative for palpitations.  Musculoskeletal: Negative for gait problem.  Neurological: Negative for tremors and headaches.  Psychiatric/Behavioral:       Please refer to HPI    Medications: I have reviewed the patient's current medications.  Current Outpatient Medications  Medication Sig Dispense Refill  . acetaminophen (TYLENOL) 500 MG tablet Take 1,000 mg by mouth every 6 (six) hours as needed for mild pain or headache.    . albuterol (PROVENTIL HFA;VENTOLIN HFA) 108 (90 Base) MCG/ACT inhaler Inhale 2 puffs into the lungs every 4 (four) hours as needed for wheezing or shortness of breath. 1 Inhaler 2  . chlorthalidone (HYGROTON) 25 MG tablet Take 1 tablet (25 mg total) by mouth daily. 90 tablet 3  . dexlansoprazole (DEXILANT) 60 MG capsule Take 1 capsule (60 mg total)  by mouth daily before breakfast. 120 capsule 3  . [START ON 12/03/2019] methylphenidate (CONCERTA) 36 MG PO CR tablet Take 1 tablet (36 mg total) by mouth daily. 30 tablet 0  . OXERVATE 0.002 % SOLN     . terconazole (TERAZOL 7) 0.4 % vaginal  cream Place 1 applicator vaginally at bedtime. 45 g 0  . valACYclovir (VALTREX) 1000 MG tablet Take 1 tablet (1,000 mg total) by mouth 2 (two) times daily. 20 tablet 1  . Vitamin D, Ergocalciferol, (DRISDOL) 1.25 MG (50000 UT) CAPS capsule Take 1 capsule (50,000 Units total) by mouth every 7 (seven) days. For 12 weeks 12 capsule 1  . DULoxetine (CYMBALTA) 60 MG capsule Take 1 capsule (60 mg total) by mouth daily. 30 capsule 5  . [START ON 11/05/2019] methylphenidate (CONCERTA) 36 MG PO CR tablet Take 1 tablet (36 mg total) by mouth daily. 30 tablet 0  . [START ON 10/08/2019] methylphenidate (CONCERTA) 36 MG PO CR tablet Take 1 tablet (36 mg total) by mouth daily. 30 tablet 0   No current facility-administered medications for this visit.     Medication Side Effects: Feels jittery immediately after taking Concerta and occ notices she is briefly talking faster.   Allergies:  Allergies  Allergen Reactions  . Macrobid [Nitrofurantoin Monohyd Macro] Itching  . Risperdal [Risperidone] Other (See Comments)    Wt gain    Past Medical History:  Diagnosis Date  . Allergy    medicine  . Anemia   . Anxiety   . Bipolar disorder ([place])   . BV (bacterial vaginosis) 08/15/2014  . CSF leak   . Depression   . GERD (gastroesophageal reflux disease)   . Headache(784.0)   . Hypertension   . Low back pain 08/15/2014  . Lupus ([place])   . Obesity   . Ulcer   . Urinary frequency 08/30/2014  . Vaginal discharge 08/15/2014  . Vaginal irritation 02/12/2014  . Yeast infection 02/12/2014    Family History  Problem Relation Age of Onset  . Depression Father   . Anxiety disorder Father   . Diabetes Father   . Hypertension Father   . Hyperlipidemia Father   . Kidney disease Father   . Mental illness Father   . Pneumonia Father   . Stroke Father   . Depression Sister   . Anxiety disorder Sister   . Cancer Sister        cervical  . Stroke Sister   . Depression Brother   . Anxiety disorder Brother   .  Cancer - Other Paternal Grandfather           . Cancer Paternal Grandfather        prostate  . Kidney disease Mother   . Diabetes Mother   . Cancer Mother        kidney  . Hyperlipidemia Mother   . Hypertension Mother   . Stroke Mother   . Aneurysm Maternal Grandfather   . Cancer Maternal Grandmother 22       breast  . Cancer Paternal Grandmother        bladder  . ADD / ADHD Neg Hx   . Alcohol abuse Neg Hx   . Drug abuse Neg Hx   . Bipolar disorder Neg Hx   . Dementia Neg Hx   . OCD Neg Hx   . Paranoid behavior Neg Hx   . Schizophrenia Neg Hx   . Seizures Neg Hx   . Sexual abuse Neg Hx   .  Physical abuse Neg Hx   . Colon cancer Neg Hx     Social History   Socioeconomic History  . Marital status: Married    Spouse name: Nathaneil Canary  . Number of children: 2  . Years of education: 33  . Highest education level: Not on file  Occupational History  . Occupation: homehealth  Social Needs  . Financial resource strain: Not on file  . Food insecurity    Worry: Not on file    Inability: Not on file  . Transportation needs    Medical: Not on file    Non-medical: Not on file  Tobacco Use  . Smoking status: Never Smoker  . Smokeless tobacco: Never Used  Substance and Sexual Activity  . Alcohol use: No    Alcohol/week: 0.0 standard drinks  . Drug use: No  . Sexual activity: Yes    Birth control/protection: Surgical    Comment: tubal  Lifestyle  . Physical activity    Days per week: Not on file    Minutes per session: Not on file  . Stress: Not on file  Relationships  . Social Herbalist on phone: Not on file    Gets together: Not on file    Attends religious service: Not on file    Active member of club or organization: Not on file    Attends meetings of clubs or organizations: Not on file    Relationship status: Not on file  . Intimate partner violence    Fear of current or ex partner: Not on file    Emotionally abused: Not on file    Physically  abused: Not on file    Forced sexual activity: Not on file  Other Topics Concern  . 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   Please see review of systems for further details on the patient's review from today.   Objective:   Physical Exam:  Wt 209 lb (94.8 kg)   BMI 40.82 kg/m   Physical Exam Neurological:     Mental Status: She is alert and oriented to person, place, and time.     Cranial Nerves: No dysarthria.  Psychiatric:        Attention and Perception: Attention normal.        Mood and Affect: Mood normal.        Speech: Speech normal.        Behavior: Behavior is cooperative.        Thought Content: Thought content normal. Thought content is not paranoid or delusional. Thought content does not include homicidal or suicidal ideation. Thought content does not include homicidal or suicidal plan.        Cognition and Memory: Cognition and memory normal.        Judgment: Judgment normal.     Lab Review:     Component Value Date/Time   NA 142 06/06/2019 0924   K 4.0 06/06/2019 0924   CL 100 06/06/2019 0924   CO2 25 06/06/2019 0924   GLUCOSE 107 (H) 06/06/2019 0924   GLUCOSE 102 (H) 08/22/2018 1235   BUN 14 06/06/2019 0924   CREATININE 0.72 06/06/2019 0924   CREATININE 0.64 09/01/2017 1635   CALCIUM 9.6 06/06/2019 0924  PROT 6.6 06/06/2019 0924   ALBUMIN 4.3 06/06/2019 0924   AST 14 06/06/2019 0924   ALT 16 06/06/2019 0924   ALKPHOS 79 06/06/2019 0924   BILITOT <0.2 06/06/2019 0924   GFRNONAA 108 06/06/2019 0924   GFRNONAA 116 09/01/2017 1635   GFRAA 125 06/06/2019 0924   GFRAA 134 09/01/2017 1635       Component Value Date/Time   WBC 7.4 06/06/2019 0924   WBC 7.7 08/22/2018 1235   RBC 4.37 06/06/2019 0924   RBC 4.61  08/22/2018 1235   HGB 11.5 06/06/2019 0924   HCT 36.0 06/06/2019 0924   PLT 266 06/06/2019 0924   MCV 82 06/06/2019 0924   MCH 26.3 (L) 06/06/2019 0924   MCH 25.8 (L) 08/22/2018 1235   MCHC 31.9 06/06/2019 0924   MCHC 30.7 08/22/2018 1235   RDW 14.7 06/06/2019 0924   LYMPHSABS 2.1 06/06/2019 0924   MONOABS 0.5 10/09/2016 1416   EOSABS 0.1 06/06/2019 0924   BASOSABS 0.1 06/06/2019 0924    Lithium Lvl  Date Value Ref Range Status  08/22/2014 0.80 0.80 - 1.40 mEq/L Final     No results found for: PHENYTOIN, PHENOBARB, VALPROATE, CBMZ   .res Assessment: Plan:   Will continue current plan of care since target signs and symptoms are well controlled without any tolerability issues. Pt to f/u in 3 months or sooner if clinically indicated. Patient advised to contact office with any questions, adverse effects, or acute worsening in signs and symptoms.  Tylena was seen today for follow-up.  Diagnoses and all orders for this visit:  Attention deficit hyperactivity disorder (ADHD), other type -     methylphenidate (CONCERTA) 36 MG PO CR tablet; Take 1 tablet (36 mg total) by mouth daily. -     methylphenidate (CONCERTA) 36 MG PO CR tablet; Take 1 tablet (36 mg total) by mouth daily. -     methylphenidate (CONCERTA) 36 MG PO CR tablet; Take 1 tablet (36 mg total) by mouth daily.  Anxiety disorder, unspecified type -     DULoxetine (CYMBALTA) 60 MG capsule; Take 1 capsule (60 mg total) by mouth daily.  Depression, unspecified depression type -     DULoxetine (CYMBALTA) 60 MG capsule; Take 1 capsule (60 mg total) by mouth daily.     Please see After Visit Summary for patient specific instructions.  Future Appointments  Date Time Provider [place]  12/05/2019  9:30 AM Estill Dooms, NP [facility]  12/07/2019  8:25 AM Dettinger, Fransisca Kaufmann, MD [facility] None    No orders of the defined types were placed in this encounter.     -------------------------------

## 2019-09-24 DIAGNOSIS — M79674 Pain in right toe(s): Secondary | ICD-10-CM | POA: Diagnosis not present

## 2019-09-24 DIAGNOSIS — L600 Ingrowing nail: Secondary | ICD-10-CM | POA: Diagnosis not present

## 2019-09-24 DIAGNOSIS — M79671 Pain in right foot: Secondary | ICD-10-CM | POA: Diagnosis not present

## 2019-09-25 IMAGING — US US ABDOMEN COMPLETE
1 series · 14 of 25 positions shown · non-contrast
Comparison: CT 10/09/2016 .

CLINICAL DATA: Liver lesion follow-up.  Proteinuria.

EXAM:
ABDOMEN ULTRASOUND COMPLETE

[Series 1: us abdomen complete · 0.22mm/px · 14 of 87 slices shown]
[im 1/87]
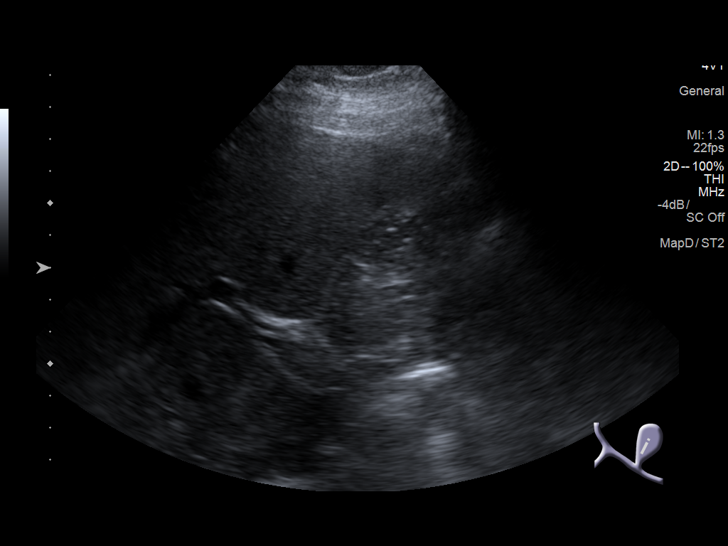
[im 8/87]
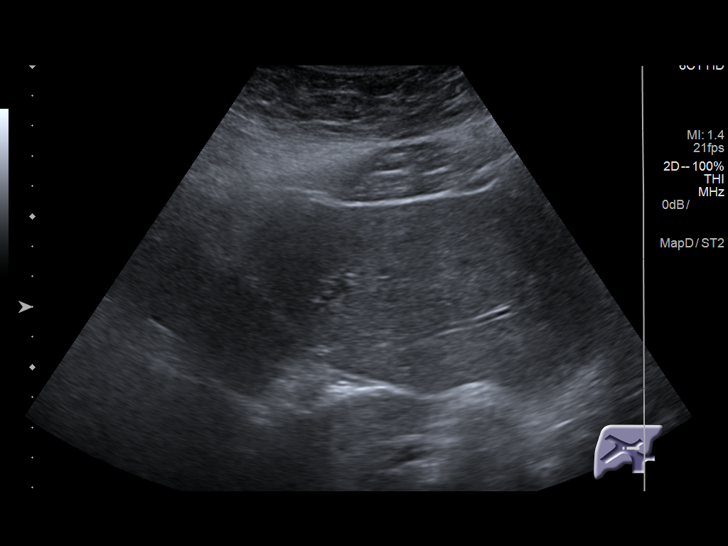
[im 15/87]
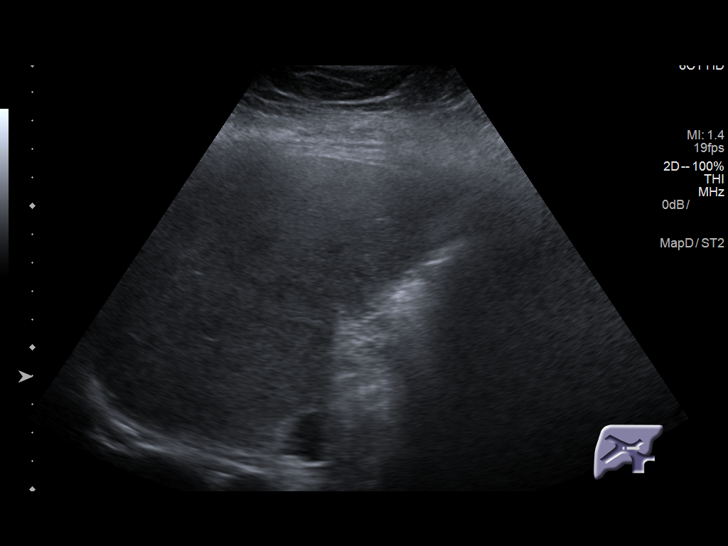
[im 22/87]
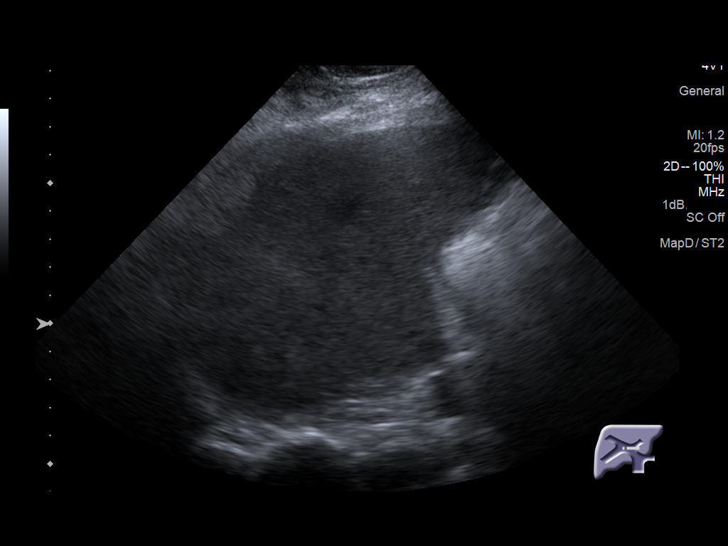
[im 29/87]
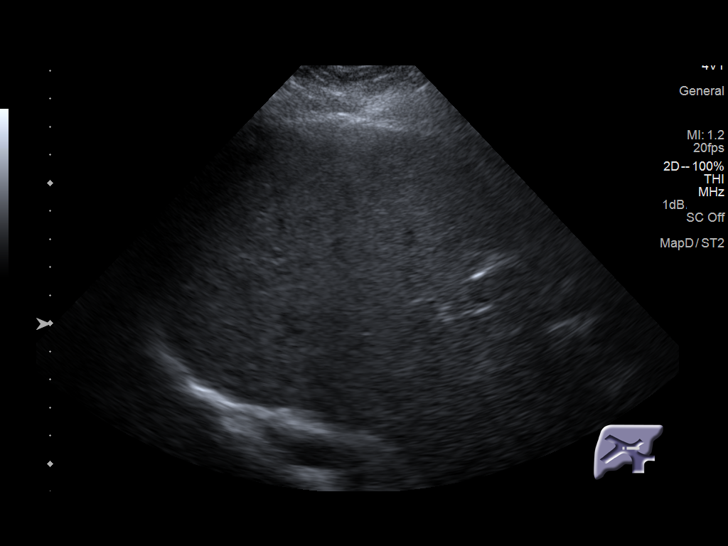
[im 33/87]
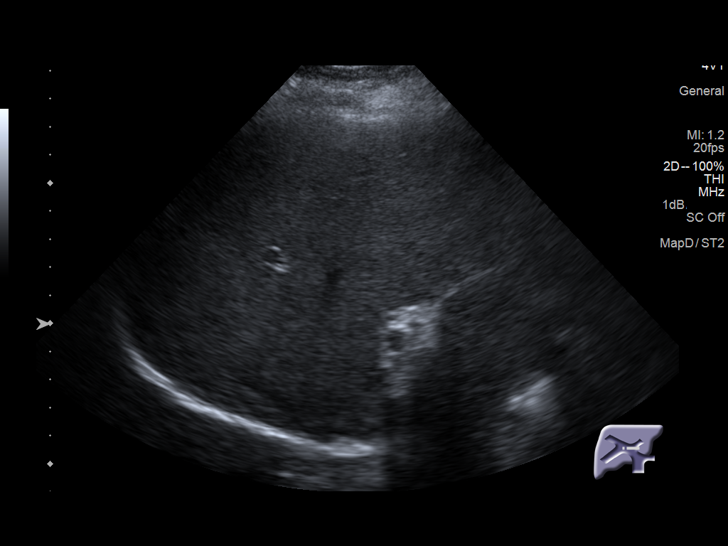
[im 40/87]
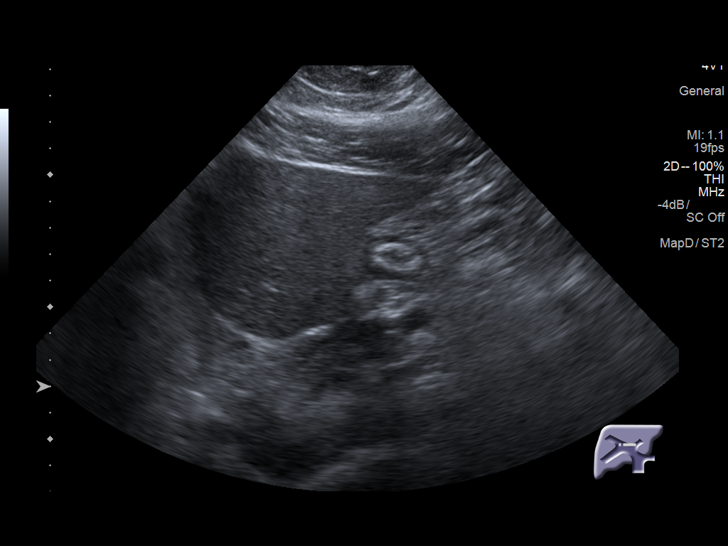
[im 47/87]
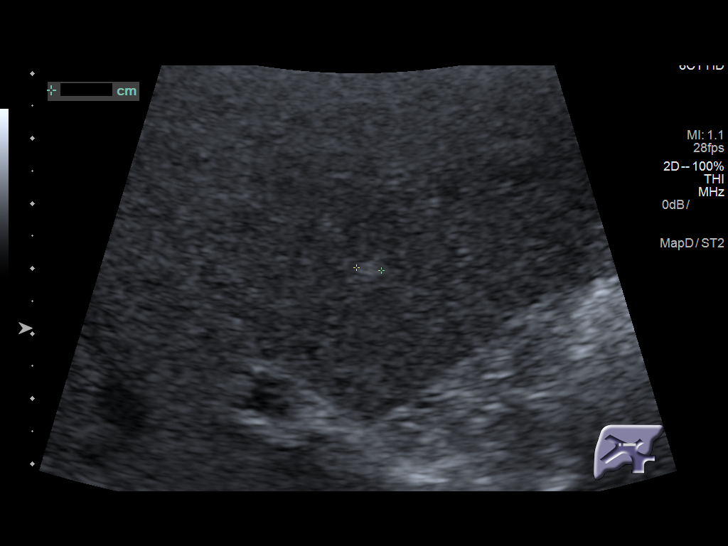
[im 54/87]
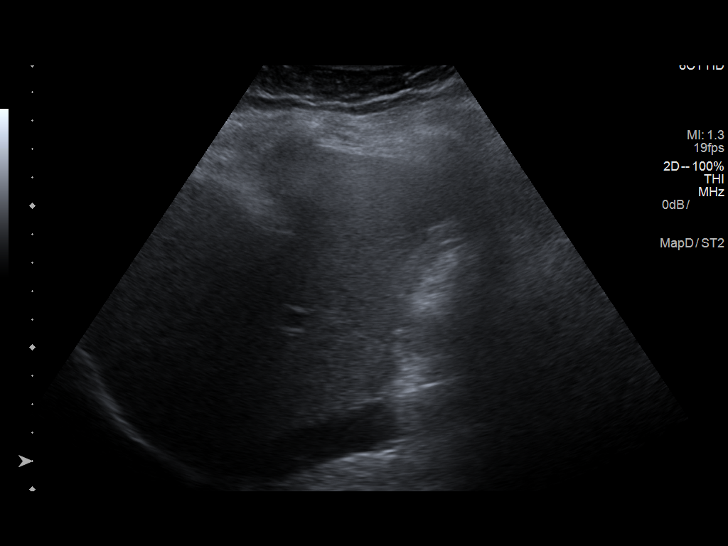
[im 58/87]
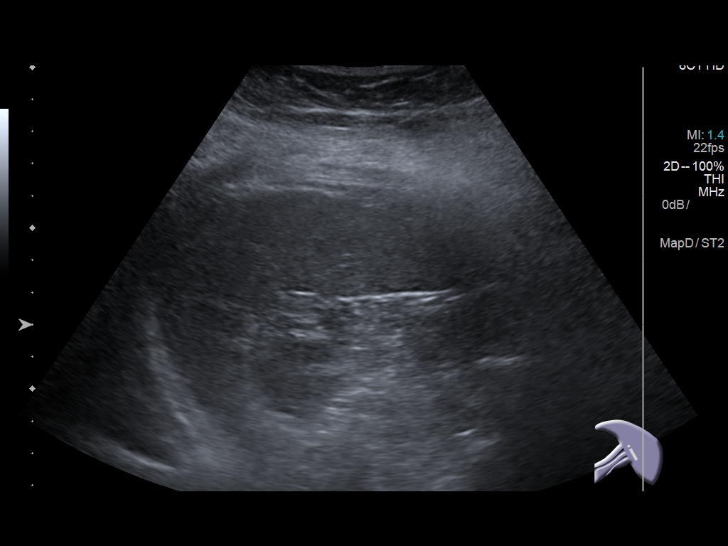
[im 65/87]
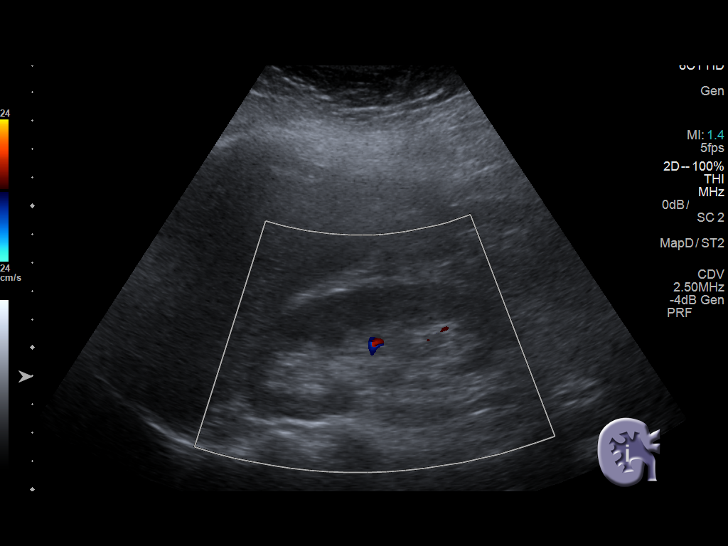
[im 72/87]
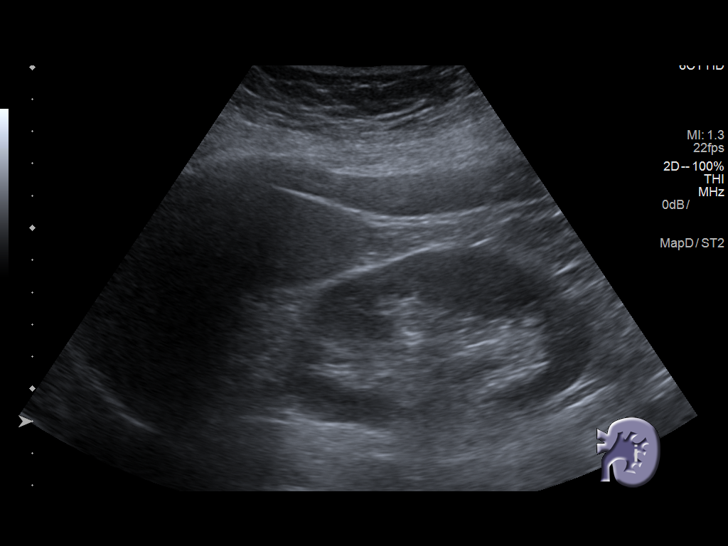
[im 79/87]
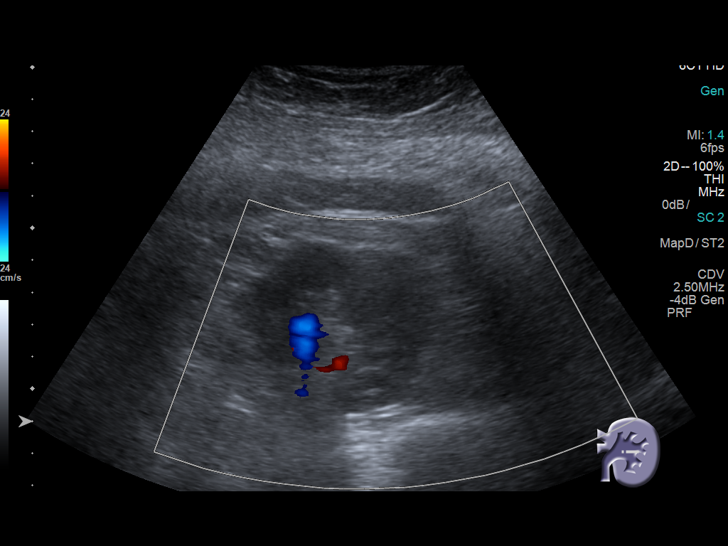
[im 87/87]
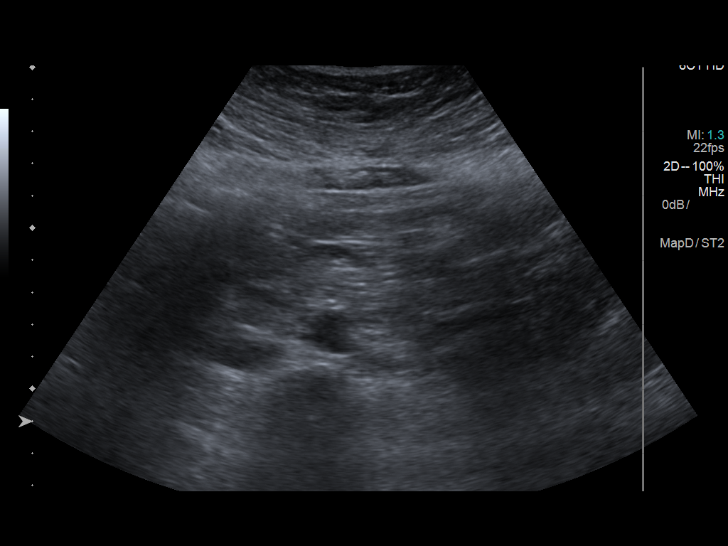

[14 of 25 positions shown; findings below may reference images not displayed]

FINDINGS: Gallbladder: Cholecystectomy.

Common bile duct: Diameter: 3.8 mm

Liver: Increased echogenicity consistent fatty infiltration and/or
hepatocellular disease. 7 mm hyperechoic focus in the right lobe
liver, similar findings noted on prior study, findings consistent
with a tiny benign hemangioma. Portal vein is patent on color
Doppler imaging with normal direction of blood flow towards the
liver.

IVC: No abnormality visualized.

Pancreas: Visualized portion unremarkable.

Spleen: Size and appearance within normal limits.

Right Kidney: Length: 10.4 cm. Echogenicity within normal limits. No
mass or hydronephrosis visualized.

Left Kidney: Length: 10.6 cm. Echogenicity within normal limits. No
mass or hydronephrosis visualized.

Abdominal aorta: No aneurysm visualized.

Other findings: None.
IMPRESSION: 1. 7 mm tiny hyperechoic nodular density again noted in the right
lobe of the liver. Findings consist with a tiny hemangioma.

2. Cholecystectomy.  No biliary distention.

## 2019-10-02 DIAGNOSIS — H18593 Other hereditary corneal dystrophies, bilateral: Secondary | ICD-10-CM | POA: Diagnosis not present

## 2019-10-02 DIAGNOSIS — H5712 Ocular pain, left eye: Secondary | ICD-10-CM | POA: Diagnosis not present

## 2019-10-02 DIAGNOSIS — H16232 Neurotrophic keratoconjunctivitis, left eye: Secondary | ICD-10-CM | POA: Diagnosis not present

## 2019-10-02 DIAGNOSIS — H04123 Dry eye syndrome of bilateral lacrimal glands: Secondary | ICD-10-CM | POA: Diagnosis not present

## 2019-10-23 ENCOUNTER — Encounter: Payer: Self-pay | Admitting: Gastroenterology

## 2019-11-13 ENCOUNTER — Other Ambulatory Visit: Payer: Self-pay | Admitting: Otolaryngology

## 2019-11-21 DIAGNOSIS — M545 Low back pain: Secondary | ICD-10-CM | POA: Diagnosis not present

## 2019-11-21 DIAGNOSIS — M9902 Segmental and somatic dysfunction of thoracic region: Secondary | ICD-10-CM | POA: Diagnosis not present

## 2019-11-21 DIAGNOSIS — M9901 Segmental and somatic dysfunction of cervical region: Secondary | ICD-10-CM | POA: Diagnosis not present

## 2019-11-21 DIAGNOSIS — M542 Cervicalgia: Secondary | ICD-10-CM | POA: Diagnosis not present

## 2019-11-21 DIAGNOSIS — M5413 Radiculopathy, cervicothoracic region: Secondary | ICD-10-CM | POA: Diagnosis not present

## 2019-11-21 DIAGNOSIS — M791 Myalgia, unspecified site: Secondary | ICD-10-CM | POA: Diagnosis not present

## 2019-11-21 DIAGNOSIS — M9903 Segmental and somatic dysfunction of lumbar region: Secondary | ICD-10-CM | POA: Diagnosis not present

## 2019-11-23 ENCOUNTER — Other Ambulatory Visit: Payer: Self-pay | Admitting: Family Medicine

## 2019-11-26 NOTE — Telephone Encounter (Signed)
OV 12/17/19

## 2019-11-27 ENCOUNTER — Encounter (HOSPITAL_BASED_OUTPATIENT_CLINIC_OR_DEPARTMENT_OTHER): Payer: Self-pay

## 2019-11-27 ENCOUNTER — Ambulatory Visit (HOSPITAL_BASED_OUTPATIENT_CLINIC_OR_DEPARTMENT_OTHER): Admit: 2019-11-27 | Payer: Medicare Other | Admitting: Otolaryngology

## 2019-11-27 SURGERY — TONSILLECTOMY AND ADENOIDECTOMY
Anesthesia: General

## 2019-11-28 DIAGNOSIS — M9902 Segmental and somatic dysfunction of thoracic region: Secondary | ICD-10-CM | POA: Diagnosis not present

## 2019-11-28 DIAGNOSIS — M542 Cervicalgia: Secondary | ICD-10-CM | POA: Diagnosis not present

## 2019-11-28 DIAGNOSIS — M9901 Segmental and somatic dysfunction of cervical region: Secondary | ICD-10-CM | POA: Diagnosis not present

## 2019-11-28 DIAGNOSIS — M5413 Radiculopathy, cervicothoracic region: Secondary | ICD-10-CM | POA: Diagnosis not present

## 2019-11-28 DIAGNOSIS — M791 Myalgia, unspecified site: Secondary | ICD-10-CM | POA: Diagnosis not present

## 2019-11-28 DIAGNOSIS — M9903 Segmental and somatic dysfunction of lumbar region: Secondary | ICD-10-CM | POA: Diagnosis not present

## 2019-11-28 DIAGNOSIS — M545 Low back pain: Secondary | ICD-10-CM | POA: Diagnosis not present

## 2019-11-30 ENCOUNTER — Telehealth: Payer: Self-pay

## 2019-11-30 NOTE — Telephone Encounter (Signed)
Renewal prior authorization submitted for non-formulary Methylphenidate ER 36 mg approved effective 11/08/2018-11/07/2020  PA Case: [id number],  [phone]. [employer] [id number]

## 2019-12-05 ENCOUNTER — Other Ambulatory Visit: Payer: Medicare Other | Admitting: Adult Health

## 2019-12-05 DIAGNOSIS — M9903 Segmental and somatic dysfunction of lumbar region: Secondary | ICD-10-CM | POA: Diagnosis not present

## 2019-12-05 DIAGNOSIS — M542 Cervicalgia: Secondary | ICD-10-CM | POA: Diagnosis not present

## 2019-12-05 DIAGNOSIS — M5413 Radiculopathy, cervicothoracic region: Secondary | ICD-10-CM | POA: Diagnosis not present

## 2019-12-05 DIAGNOSIS — M9901 Segmental and somatic dysfunction of cervical region: Secondary | ICD-10-CM | POA: Diagnosis not present

## 2019-12-05 DIAGNOSIS — M9902 Segmental and somatic dysfunction of thoracic region: Secondary | ICD-10-CM | POA: Diagnosis not present

## 2019-12-05 DIAGNOSIS — M545 Low back pain: Secondary | ICD-10-CM | POA: Diagnosis not present

## 2019-12-05 DIAGNOSIS — M791 Myalgia, unspecified site: Secondary | ICD-10-CM | POA: Diagnosis not present

## 2019-12-07 ENCOUNTER — Ambulatory Visit: Payer: Medicare Other | Admitting: Family Medicine

## 2019-12-10 ENCOUNTER — Encounter: Payer: Self-pay | Admitting: Psychiatry

## 2019-12-10 ENCOUNTER — Ambulatory Visit (INDEPENDENT_AMBULATORY_CARE_PROVIDER_SITE_OTHER): Payer: Medicare Other | Admitting: Psychiatry

## 2019-12-10 ENCOUNTER — Other Ambulatory Visit: Payer: Self-pay

## 2019-12-10 VITALS — BP 113/74 | HR 78 | Wt 185.0 lb

## 2019-12-10 DIAGNOSIS — F419 Anxiety disorder, unspecified: Secondary | ICD-10-CM

## 2019-12-10 DIAGNOSIS — F908 Attention-deficit hyperactivity disorder, other type: Secondary | ICD-10-CM

## 2019-12-10 DIAGNOSIS — F32A Depression, unspecified: Secondary | ICD-10-CM

## 2019-12-10 DIAGNOSIS — F329 Major depressive disorder, single episode, unspecified: Secondary | ICD-10-CM | POA: Diagnosis not present

## 2019-12-10 MED ORDER — METHYLPHENIDATE HCL ER (OSM) 36 MG PO TBCR: 36.0000 mg | EXTENDED_RELEASE_TABLET | Freq: Every day | ORAL | 0 refills | Status: DC

## 2019-12-10 NOTE — Progress Notes (Signed)
MARANATHA NEWSHAM UZ:[id number] August 24, 1981 39 y.o.  Subjective:   Patient ID:  Teresa Tran is a 39 y.o. (DOB 1981/03/04) female.  Chief Complaint:  Chief Complaint  Patient presents with  . Follow-up    h/o anxiety, ADD, fatigue    HPI Teresa Tran presents to the office today for follow-up of depression, anxiety, and ADD. She reports that her mood has been good and denies recent depression. Anxiety has been controlled. Denies any panic attacks. She reports adequate sleep. She goes to bed between 11pm-12 midnight and is sleeping throughout the night. She reports that she has been feeling well and trying to make positive changes. She reports that she has been making dietary changes and trying to be more active. Appetite has been stable.  She reports some slight improvement in energy and that energy remains somewhat low overall. Motivation has been good. Concentration has been adequate with medication. Reports that she is now able to be productive throughout the day. Noticed that concentration was not as good when she did not have medication for a few days. She reports 55 lb intentional weight loss. Denies SI.   She reports that she stopped Cymbalta in November and has been feeling less tired. She reports having some increase in pain since stopping Cymbalta.   Works part-time. 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   Past Psychiatric Medication Trials: Rexulti- increased HA's, drowsiness Cymbalta Buspar Cerefolin Topamax Prozac Celexa Lexapro Xanax Ativan Lithium-tremors Lamictal risperdal Seroquel Wellbutrin- Ineffective, HA Phentermine Gabapentin- drowsiness Methylphenidate-Effective for concentration in the morning Propranolol- caused dizziness   PHQ2-9     Office Visit from 09/14/2019 in [facility] Office Visit from 06/06/2019 in [place] from 12/25/2018 in [facility] [place]  Office Visit from 12/13/2018 in [place] Visit from 09/28/2018 in [facility]  PHQ-2 Total Score  0  0  0  0  0       Review of Systems:  Review of Systems  Cardiovascular: Negative for palpitations.  Musculoskeletal: Positive for arthralgias and back pain. Negative for gait problem.  Neurological: Negative for tremors.  Psychiatric/Behavioral:       Please refer to HPI    Medications: I have reviewed the patient's current medications.  Current Outpatient Medications  Medication Sig Dispense Refill  . acetaminophen (TYLENOL) 500 MG tablet Take 1,000 mg by mouth every 6 (six) hours as needed for mild pain or headache.    . chlorthalidone (HYGROTON) 25 MG tablet Take 1 tablet by mouth once daily 90 tablet 0  . dexlansoprazole (DEXILANT) 60 MG capsule Take 1 capsule (60 mg total) by mouth daily before breakfast. 120 capsule 3  . [START ON 02/04/2020] methylphenidate (CONCERTA) 36 MG PO CR tablet Take 1 tablet (36 mg total) by mouth daily. 30 tablet 0  . [START ON 01/07/2020] methylphenidate (CONCERTA) 36 MG PO CR tablet Take 1 tablet (36 mg total) by mouth daily. 30 tablet 0  . methylphenidate (CONCERTA) 36 MG PO CR tablet Take 1 tablet (36 mg total) by mouth daily. 30 tablet 0  . Multiple Vitamins-Minerals (IMMUNE SUPPORT PO) Take by mouth.    Marland Kitchen albuterol (PROVENTIL HFA;VENTOLIN HFA) 108 (90 Base) MCG/ACT inhaler Inhale 2 puffs into the lungs every 4 (four) hours as needed for wheezing or shortness of breath. 1 Inhaler 2  . terconazole (TERAZOL 7) 0.4 % vaginal cream Place 1 applicator vaginally at  bedtime. (Patient not taking: Reported on 12/10/2019) 45 g 0  . valACYclovir (VALTREX) 1000 MG tablet Take 1 tablet (1,000 mg total) by mouth 2 (two) times daily. (Patient not taking: Reported on 12/10/2019) 20 tablet 1  . Vitamin D, Ergocalciferol, (DRISDOL) 1.25 MG (50000 UT) CAPS capsule Take 1 capsule (50,000 Units total) by mouth every 7 (seven) days. For 12  weeks (Patient not taking: Reported on 12/10/2019) 12 capsule 1   No current facility-administered medications for this visit.    Medication Side Effects: Other: Jittery for a short period after taking medication  Allergies:  Allergies  Allergen Reactions  . Macrobid [Nitrofurantoin Monohyd Macro] Itching  . Risperdal [Risperidone] Other (See Comments)    Wt gain    Past Medical History:  Diagnosis Date  . Allergy    medicine  . Anemia   . Anxiety   . Bipolar disorder ([place])   . BV (bacterial vaginosis) 08/15/2014  . CSF leak   . Depression   . GERD (gastroesophageal reflux disease)   . Headache(784.0)   . Hypertension   . Low back pain 08/15/2014  . Lupus ([place])   . Obesity   . Ulcer   . Urinary frequency 08/30/2014  . Vaginal discharge 08/15/2014  . Vaginal irritation 02/12/2014  . Yeast infection 02/12/2014    Family History  Problem Relation Age of Onset  . Depression Father   . Anxiety disorder Father   . Diabetes Father   . Hypertension Father   . Hyperlipidemia Father   . Kidney disease Father   . Mental illness Father   . Pneumonia Father   . Stroke Father   . Depression Sister   . Anxiety disorder Sister   . Cancer Sister        cervical  . Stroke Sister   . Depression Brother   . Anxiety disorder Brother   . Cancer - Other Paternal Grandfather           . Cancer Paternal Grandfather        prostate  . Kidney disease Mother   . Diabetes Mother   . Cancer Mother        kidney  . Hyperlipidemia Mother   . Hypertension Mother   . Stroke Mother   . Aneurysm Maternal Grandfather   . Cancer Maternal Grandmother 21       breast  . Cancer Paternal Grandmother        bladder  . ADD / ADHD Neg Hx   . Alcohol abuse Neg Hx   . Drug abuse Neg Hx   . Bipolar disorder Neg Hx   . Dementia Neg Hx   . OCD Neg Hx   . Paranoid behavior Neg Hx   . Schizophrenia Neg Hx   . Seizures Neg Hx   . Sexual abuse Neg Hx   . Physical abuse Neg Hx   . Colon cancer Neg  Hx     Social History   Socioeconomic History  . Marital status: Married    Spouse name: Nathaneil Canary  . Number of children: 2  . Years of education: 28  . Highest education level: Not on file  Occupational History  . Occupation: homehealth  Tobacco Use  . Smoking status: Never Smoker  . Smokeless tobacco: Never Used  Substance and Sexual Activity  . Alcohol use: No    Alcohol/week: 0.0 standard drinks  . Drug use: No  . Sexual activity: Yes    Birth control/protection: Surgical  Comment: tubal  Other Topics Concern  . 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 Difficulty of Paying Living Expenses: Not on file  Food Insecurity:   . Worried About Charity fundraiser in the Last Year: Not on file  . Ran Out of Food in the Last Year: Not on file  Transportation Needs:   . Lack of Transportation (Medical): Not on file  . Lack of Transportation (Non-Medical): Not on file  Physical Activity:   . Days of Exercise per Week: Not on file  . Minutes of Exercise per Session: Not on file  Stress:   . Feeling of Stress : Not on file  Social Connections:   . Frequency of Communication with Friends and Family: Not on file  . Frequency of Social Gatherings with Friends and Family: Not on file  . Attends Religious Services: Not on file  . Active Member of Clubs or Organizations: Not on file  . Attends Archivist Meetings: Not on file  . Marital Status: Not on file  Intimate Partner Violence:   . Fear of Current or Ex-Partner: Not on file  . Emotionally Abused: Not on file  . Physically Abused: Not on file  . Sexually Abused: Not on file    Past Medical History, Surgical history, Social history, and Family history were reviewed and updated as appropriate.   Please see review of systems for further  details on the patient's review from today.   Objective:   Physical Exam:  BP 113/74   Pulse 78   Wt 185 lb (83.9 kg)   BMI 36.13 kg/m   Physical Exam Constitutional:      General: She is not in acute distress.    Appearance: She is well-developed.  Musculoskeletal:        General: No deformity.  Neurological:     Mental Status: She is alert and oriented to person, place, and time.     Coordination: Coordination normal.  Psychiatric:        Attention and Perception: Attention and perception normal. She does not perceive auditory or visual hallucinations.        Mood and Affect: Mood normal. Mood is not anxious or depressed. Affect is not labile, blunt, angry or inappropriate.        Speech: Speech normal.        Behavior: Behavior normal.        Thought Content: Thought content normal. Thought content is not paranoid or delusional. Thought content does not include homicidal or suicidal ideation. Thought content does not include homicidal or suicidal plan.        Cognition and Memory: Cognition and memory normal.        Judgment: Judgment normal.     Comments: Insight intact     Lab Review:     Component Value Date/Time   NA 142 06/06/2019 0924   K 4.0 06/06/2019 0924   CL 100 06/06/2019 0924   CO2 25 06/06/2019 0924   GLUCOSE 107 (H) 06/06/2019 0924   GLUCOSE 102 (H) 08/22/2018 1235   BUN 14 06/06/2019 0924   CREATININE 0.72 06/06/2019 0924   CREATININE 0.64 09/01/2017 1635   CALCIUM 9.6 06/06/2019 0924   PROT 6.6 06/06/2019 0924   ALBUMIN 4.3 06/06/2019  0924   AST 14 06/06/2019 0924   ALT 16 06/06/2019 0924   ALKPHOS 79 06/06/2019 0924   BILITOT <0.2 06/06/2019 0924   GFRNONAA 108 06/06/2019 0924   GFRNONAA 116 09/01/2017 1635   GFRAA 125 06/06/2019 0924   GFRAA 134 09/01/2017 1635       Component Value Date/Time   WBC 7.4 06/06/2019 0924   WBC 7.7 08/22/2018 1235   RBC 4.37 06/06/2019 0924   RBC 4.61 08/22/2018 1235   HGB 11.5 06/06/2019 0924   HCT  36.0 06/06/2019 0924   PLT 266 06/06/2019 0924   MCV 82 06/06/2019 0924   MCH 26.3 (L) 06/06/2019 0924   MCH 25.8 (L) 08/22/2018 1235   MCHC 31.9 06/06/2019 0924   MCHC 30.7 08/22/2018 1235   RDW 14.7 06/06/2019 0924   LYMPHSABS 2.1 06/06/2019 0924   MONOABS 0.5 10/09/2016 1416   EOSABS 0.1 06/06/2019 0924   BASOSABS 0.1 06/06/2019 0924    Lithium Lvl  Date Value Ref Range Status  08/22/2014 0.80 0.80 - 1.40 mEq/L Final     No results found for: PHENYTOIN, PHENOBARB, VALPROATE, CBMZ   .res Assessment: Plan:   Will continue Concerta since this has been helpful for her concentration and energy.  Will not re-start Cymbalta or another anti-depressant at this time since pt denies any current depressive s/s or anxiety.  Pt to f/u in 3 months or sooner if clinically indicated.  Patient advised to contact office with any questions, adverse effects, or acute worsening in signs and symptoms.  Ebony was seen today for follow-up.  Diagnoses and all orders for this visit:  Anxiety disorder, unspecified type  Attention deficit hyperactivity disorder (ADHD), other type -     methylphenidate (CONCERTA) 36 MG PO CR tablet; Take 1 tablet (36 mg total) by mouth daily. -     methylphenidate (CONCERTA) 36 MG PO CR tablet; Take 1 tablet (36 mg total) by mouth daily. -     methylphenidate (CONCERTA) 36 MG PO CR tablet; Take 1 tablet (36 mg total) by mouth daily.  Depression, unspecified depression type     Please see After Visit Summary for patient specific instructions.  Future Appointments  Date Time Provider [place]  12/17/2019  9:10 AM Dettinger, Fransisca Kaufmann, MD [facility] None  12/25/2019 10:10 AM Estill Dooms, NP [facility]  03/10/2020 11:30 AM Thayer Headings, PMHNP [facility] None    No orders of the defined types were placed in this encounter.   -------------------------------

## 2019-12-12 DIAGNOSIS — M5413 Radiculopathy, cervicothoracic region: Secondary | ICD-10-CM | POA: Diagnosis not present

## 2019-12-12 DIAGNOSIS — M9901 Segmental and somatic dysfunction of cervical region: Secondary | ICD-10-CM | POA: Diagnosis not present

## 2019-12-12 DIAGNOSIS — M9903 Segmental and somatic dysfunction of lumbar region: Secondary | ICD-10-CM | POA: Diagnosis not present

## 2019-12-12 DIAGNOSIS — M545 Low back pain: Secondary | ICD-10-CM | POA: Diagnosis not present

## 2019-12-12 DIAGNOSIS — M542 Cervicalgia: Secondary | ICD-10-CM | POA: Diagnosis not present

## 2019-12-12 DIAGNOSIS — M791 Myalgia, unspecified site: Secondary | ICD-10-CM | POA: Diagnosis not present

## 2019-12-12 DIAGNOSIS — M9902 Segmental and somatic dysfunction of thoracic region: Secondary | ICD-10-CM | POA: Diagnosis not present

## 2019-12-17 ENCOUNTER — Encounter: Payer: Self-pay | Admitting: Family Medicine

## 2019-12-17 ENCOUNTER — Ambulatory Visit (INDEPENDENT_AMBULATORY_CARE_PROVIDER_SITE_OTHER): Payer: Medicare Other | Admitting: Family Medicine

## 2019-12-17 ENCOUNTER — Ambulatory Visit: Payer: Medicare Other | Admitting: Family Medicine

## 2019-12-17 ENCOUNTER — Other Ambulatory Visit: Payer: Self-pay

## 2019-12-17 VITALS — BP 128/79 | HR 102 | Temp 98.9°F | Ht 60.0 in | Wt 175.8 lb

## 2019-12-17 DIAGNOSIS — R002 Palpitations: Secondary | ICD-10-CM | POA: Diagnosis not present

## 2019-12-17 DIAGNOSIS — R7303 Prediabetes: Secondary | ICD-10-CM

## 2019-12-17 DIAGNOSIS — K219 Gastro-esophageal reflux disease without esophagitis: Secondary | ICD-10-CM | POA: Diagnosis not present

## 2019-12-17 DIAGNOSIS — N2889 Other specified disorders of kidney and ureter: Secondary | ICD-10-CM

## 2019-12-17 DIAGNOSIS — R5382 Chronic fatigue, unspecified: Secondary | ICD-10-CM

## 2019-12-17 DIAGNOSIS — I10 Essential (primary) hypertension: Secondary | ICD-10-CM | POA: Diagnosis not present

## 2019-12-17 DIAGNOSIS — E669 Obesity, unspecified: Secondary | ICD-10-CM

## 2019-12-17 LAB — BAYER DCA HB A1C WAIVED: HB A1C (BAYER DCA - WAIVED): 5.5 % (ref <7.0)

## 2019-12-17 MED ORDER — CHLORTHALIDONE 25 MG PO TABS: 25.0000 mg | ORAL_TABLET | Freq: Every day | ORAL | 3 refills | Status: DC

## 2019-12-17 NOTE — Progress Notes (Signed)
BP 128/79   Pulse (!) 102   Temp 98.9 F (37.2 C) (Temporal)   Ht 5' (1.524 m)   Wt 175 lb 12.8 oz (79.7 kg)   LMP 12/03/2019 (Approximate)   SpO2 99%   BMI 34.33 kg/m    Subjective:   Patient ID: Teresa Tran, female    DOB: 1981-08-03, 39 y.o.   MRN: [id number]  HPI: Teresa Tran is a 39 y.o. female presenting on 12/17/2019 for prediabetes (6 month follow up)   HPI Prediabetes Patient comes in today for recheck of his diabetes. Patient has been currently taking no medication has been diet controlled and A1c is 5.5 today. Patient is not currently on an ACE inhibitor/ARB. Patient has not seen an ophthalmologist this year but she does have an appointment. Patient denies any issues with their feet.  Patient has had some great weight loss and this is helped bring these numbers down  Hypertension Patient is currently on chlorthalidone, and their blood pressure today is 128/79 with a heart rate of 102, she has had some recent blood pressures up at home with heart rates up at home, she will keep a better log about when they are happening and bring that back with her.. Patient denies any lightheadedness or dizziness. Patient denies headaches, blurred vision, chest pains, shortness of breath, or weakness. Denies any side effects from medication and is content with current medication.  Patient does complain of occasional palpitations with maybe a little bit of lightheadedness associated with them that are not happening intermittently and she feels like her heart rate and blood pressure will go up slightly associated with them.  GERD Patient is currently on [place].  She denies any major symptoms or abdominal pain or belching or burping. She denies any blood in her stool or lightheadedness or dizziness.   Patient has history of positive ANAs and rheumatological conditions possibly and she wants to have it rechecked.  She has been seeing [facility] for that previously  Relevant past medical, surgical,  family and social history reviewed and updated as indicated. Interim medical history since our last visit reviewed. Allergies and medications reviewed and updated.  Review of Systems  Constitutional: Negative for chills and fever.  Eyes: Negative for visual disturbance.  Respiratory: Negative for chest tightness and shortness of breath.   Cardiovascular: Positive for palpitations. Negative for chest pain and leg swelling.  Musculoskeletal: Negative for back pain and gait problem.  Skin: Negative for rash.  Neurological: Positive for dizziness and light-headedness. Negative for headaches.  Psychiatric/Behavioral: Negative for agitation and behavioral problems.  All other systems reviewed and are negative.   Per HPI unless specifically indicated above   Allergies as of 12/17/2019      Reactions   Macrobid [nitrofurantoin Monohyd Macro] Itching   Risperdal [risperidone] Other (See Comments)   Wt gain      Medication List       Accurate as of December 17, 2019 10:34 AM. If you have any questions, ask your nurse or doctor.        STOP taking these medications   terconazole 0.4 % vaginal cream Commonly known as: TERAZOL 7 Stopped by: Worthy Rancher, MD   valACYclovir 1000 MG tablet Commonly known as: VALTREX Stopped by: Worthy Rancher, MD   Vitamin D (Ergocalciferol) 1.25 MG (50000 UNIT) Caps capsule Commonly known as: DRISDOL Stopped by: Fransisca Kaufmann Lorann Tani, MD     TAKE these medications   acetaminophen 500 MG tablet Commonly  known as: TYLENOL Take 1,000 mg by mouth every 6 (six) hours as needed for mild pain or headache.   albuterol 108 (90 Base) MCG/ACT inhaler Commonly known as: VENTOLIN HFA Inhale 2 puffs into the lungs every 4 (four) hours as needed for wheezing or shortness of breath.   chlorthalidone 25 MG tablet Commonly known as: HYGROTON Take 1 tablet (25 mg total) by mouth daily.   dexlansoprazole 60 MG capsule Commonly known as: Dexilant Take 1  capsule (60 mg total) by mouth daily before breakfast.   IMMUNE SUPPORT PO Take by mouth.   methylphenidate 36 MG CR tablet Commonly known as: Concerta Take 1 tablet (36 mg total) by mouth daily.   methylphenidate 36 MG CR tablet Commonly known as: Concerta Take 1 tablet (36 mg total) by mouth daily. Start taking on: January 07, 2020   methylphenidate 36 MG CR tablet Commonly known as: Concerta Take 1 tablet (36 mg total) by mouth daily. Start taking on: February 04, 2020        Objective:   BP 128/79   Pulse (!) 102   Temp 98.9 F (37.2 C) (Temporal)   Ht 5' (1.524 m)   Wt 175 lb 12.8 oz (79.7 kg)   LMP 12/03/2019 (Approximate)   SpO2 99%   BMI 34.33 kg/m   Wt Readings from Last 3 Encounters:  12/17/19 175 lb 12.8 oz (79.7 kg)  09/14/19 209 lb 9.6 oz (95.1 kg)  06/06/19 237 lb 9.6 oz (107.8 kg)    Physical Exam Vitals and nursing note reviewed.  Constitutional:      General: She is not in acute distress.    Appearance: She is well-developed. She is not diaphoretic.  Eyes:     Conjunctiva/sclera: Conjunctivae normal.     Pupils: Pupils are equal, round, and reactive to light.  Cardiovascular:     Rate and Rhythm: Normal rate and regular rhythm.     Heart sounds: Normal heart sounds. No murmur.  Pulmonary:     Effort: Pulmonary effort is normal. No respiratory distress.     Breath sounds: Normal breath sounds. No wheezing.  Musculoskeletal:        General: No swelling or tenderness. Normal range of motion.  Skin:    General: Skin is warm and dry.     Findings: No rash.  Neurological:     Mental Status: She is alert and oriented to person, place, and time.     Coordination: Coordination normal.  Psychiatric:        Behavior: Behavior normal.       Assessment & Plan:   Problem List Items Addressed This Visit      Cardiovascular and Mediastinum   Essential hypertension   Relevant Medications   chlorthalidone (HYGROTON) 25 MG tablet   Other Relevant  Orders   CMP14+EGFR     Digestive   GERD (gastroesophageal reflux disease)   Relevant Orders   CBC with Differential/Platelet   CMP14+EGFR     Other   Chronic fatigue   Relevant Orders   CBC with Differential/Platelet   TSH   ANA,IFA RA Diag Pnl w/rflx Tit/Patn   Obesity (BMI 30.0-34.9)   Prediabetes - Primary   Relevant Orders   Microalbumin / creatinine urine ratio   Bayer DCA Hb A1c Waived   CBC with Differential/Platelet   CMP14+EGFR   Lipid panel    Other Visit Diagnoses    Palpitation        Patient will keep a logbook  of when the palpitations happen and her heart rate and will recheck it after 10 minutes of relaxation and bring [name] some numbers, may consider monitor in the future if it is happening frequently enough will do 24-hour.  If not then may consider longer monitor.  We will check thyroid and ANA for chronic fatigue and palpitations.  A1c 5.5 looks great  Follow up plan: Return in about 6 months (around 06/15/2020), or if symptoms worsen or fail to improve, for Hypertension and prediabetes and GERD.  Counseling provided for all of the vaccine components Orders Placed This Encounter  Procedures  . Microalbumin / creatinine urine ratio  . Bayer DCA Hb A1c Waived  . CBC with Differential/Platelet  . CMP14+EGFR  . Lipid panel  . TSH  . ANA,IFA RA Diag Pnl w/rflx Tit/Patn    Caryl Pina, MD [facility] 12/17/2019, 10:34 AM

## 2019-12-18 LAB — MICROALBUMIN / CREATININE URINE RATIO
Creatinine, Urine: 414.4 mg/dL
Microalb/Creat Ratio: 7 mg/g creat (ref 0–29)
Microalbumin, Urine: 27.1 ug/mL

## 2019-12-19 DIAGNOSIS — M545 Low back pain: Secondary | ICD-10-CM | POA: Diagnosis not present

## 2019-12-19 DIAGNOSIS — M542 Cervicalgia: Secondary | ICD-10-CM | POA: Diagnosis not present

## 2019-12-19 DIAGNOSIS — M9903 Segmental and somatic dysfunction of lumbar region: Secondary | ICD-10-CM | POA: Diagnosis not present

## 2019-12-19 DIAGNOSIS — M5413 Radiculopathy, cervicothoracic region: Secondary | ICD-10-CM | POA: Diagnosis not present

## 2019-12-19 DIAGNOSIS — M9902 Segmental and somatic dysfunction of thoracic region: Secondary | ICD-10-CM | POA: Diagnosis not present

## 2019-12-19 DIAGNOSIS — M791 Myalgia, unspecified site: Secondary | ICD-10-CM | POA: Diagnosis not present

## 2019-12-19 DIAGNOSIS — M9901 Segmental and somatic dysfunction of cervical region: Secondary | ICD-10-CM | POA: Diagnosis not present

## 2019-12-19 LAB — TSH: TSH: 2.87 u[IU]/mL (ref 0.450–4.500)

## 2019-12-19 LAB — ANA,IFA RA DIAG PNL W/RFLX TIT/PATN
ANA Titer 1: POSITIVE — AB
Cyclic Citrullin Peptide Ab: 5 units (ref 0–19)
Rheumatoid fact SerPl-aCnc: 10.9 IU/mL (ref 0.0–13.9)

## 2019-12-19 LAB — CMP14+EGFR
ALT: 30 IU/L (ref 0–32)
AST: 28 IU/L (ref 0–40)
Albumin/Globulin Ratio: 1.6 (ref 1.2–2.2)
Albumin: 4.5 g/dL (ref 3.8–4.8)
Alkaline Phosphatase: 79 IU/L (ref 39–117)
BUN/Creatinine Ratio: 13 (ref 9–23)
BUN: 10 mg/dL (ref 6–20)
Bilirubin Total: 0.5 mg/dL (ref 0.0–1.2)
CO2: 19 mmol/L — ABNORMAL LOW (ref 20–29)
Calcium: 9.9 mg/dL (ref 8.7–10.2)
Chloride: 100 mmol/L (ref 96–106)
Creatinine, Ser: 0.79 mg/dL (ref 0.57–1.00)
GFR calc Af Amer: 111 mL/min/{1.73_m2} (ref >59)
GFR calc non Af Amer: 96 mL/min/{1.73_m2} (ref >59)
Globulin, Total: 2.8 g/dL (ref 1.5–4.5)
Glucose: 89 mg/dL (ref 65–99)
Potassium: 3.3 mmol/L — ABNORMAL LOW (ref 3.5–5.2)
Sodium: 142 mmol/L (ref 134–144)
Total Protein: 7.3 g/dL (ref 6.0–8.5)

## 2019-12-19 LAB — FANA STAINING PATTERNS: Homogeneous Pattern: 1:80 {titer}

## 2019-12-19 LAB — CBC WITH DIFFERENTIAL/PLATELET
Basophils Absolute: 0.1 10*3/uL (ref 0.0–0.2)
Basos: 1 %
EOS (ABSOLUTE): 0 10*3/uL (ref 0.0–0.4)
Eos: 1 %
Hematocrit: 40.7 % (ref 34.0–46.6)
Hemoglobin: 13.6 g/dL (ref 11.1–15.9)
Immature Grans (Abs): 0 10*3/uL (ref 0.0–0.1)
Immature Granulocytes: 0 %
Lymphocytes Absolute: 1.3 10*3/uL (ref 0.7–3.1)
Lymphs: 20 %
MCH: 26.2 pg — ABNORMAL LOW (ref 26.6–33.0)
MCHC: 33.4 g/dL (ref 31.5–35.7)
MCV: 78 fL — ABNORMAL LOW (ref 79–97)
Monocytes Absolute: 0.5 10*3/uL (ref 0.1–0.9)
Monocytes: 7 %
Neutrophils Absolute: 4.8 10*3/uL (ref 1.4–7.0)
Neutrophils: 71 %
Platelets: 210 10*3/uL (ref 150–450)
RBC: 5.19 x10E6/uL (ref 3.77–5.28)
RDW: 14.7 % (ref 11.7–15.4)
WBC: 6.7 10*3/uL (ref 3.4–10.8)

## 2019-12-19 LAB — LIPID PANEL
Chol/HDL Ratio: 3.4 ratio (ref 0.0–4.4)
Cholesterol, Total: 154 mg/dL (ref 100–199)
HDL: 45 mg/dL (ref >39)
LDL Chol Calc (NIH): 95 mg/dL (ref 0–99)
Triglycerides: 73 mg/dL (ref 0–149)
VLDL Cholesterol Cal: 14 mg/dL (ref 5–40)

## 2019-12-21 ENCOUNTER — Ambulatory Visit (INDEPENDENT_AMBULATORY_CARE_PROVIDER_SITE_OTHER): Payer: Medicare Other | Admitting: *Deleted

## 2019-12-21 ENCOUNTER — Telehealth: Payer: Self-pay | Admitting: Family Medicine

## 2019-12-21 DIAGNOSIS — Z0000 Encounter for general adult medical examination without abnormal findings: Secondary | ICD-10-CM

## 2019-12-21 NOTE — Progress Notes (Signed)
MEDICARE ANNUAL WELLNESS VISIT  12/21/2019  Telephone Visit Disclaimer This Medicare AWV was conducted by telephone due to national recommendations for restrictions regarding the COVID-19 Pandemic (e.g. social distancing).  I verified, using two identifiers, that I am speaking with Teresa Tran or their authorized healthcare agent. I discussed the limitations, risks, security, and privacy concerns of performing an evaluation and management service by telephone and the potential availability of an in-person appointment in the future. The patient expressed understanding and agreed to proceed.   Subjective:  Teresa Tran is a 39 y.o. female patient of Dettinger, Fransisca Kaufmann, MD who had a Medicare Annual Wellness Visit today via telephone. Teresa Tran is going to school working on her Masters Degree for Counseling and lives with their spouse. she has 2 children. she reports that she is socially active and does interact with friends/family regularly. she is moderately physically active and enjoys reading, scrap booking, spending time with family and doing community outreach.  Patient Care Team: Dettinger, Fransisca Kaufmann, MD as PCP - General (Family Medicine) Danie Binder, MD as Consulting Physician (Gastroenterology)  Advanced Directives 12/21/2019 08/22/2018 07/20/2017 12/02/2016 10/25/2016 10/09/2016 07/20/2016  Does Patient Have a Medical Advance Directive? No No No No No No No  Would patient like information on creating a medical advance directive? No - Patient declined - No - Patient declined No - Patient declined No - Patient declined No - Patient declined No - patient declined information  Pre-existing out of facility DNR order (yellow form or pink MOST form) - - - - - - -  Some encounter information is confidential and restricted. Go to Review Flowsheets activity to see all data.    Hospital Utilization Over the Past 12 Months: # of hospitalizations or ER visits: 0 # of surgeries: 0  Review of Systems     Patient reports that her overall health is worse compared to last year.  History obtained from chart review  Patient Reported Readings (BP, Pulse, CBG, Weight, etc) none  Pain Assessment Pain : 0-10 Pain Score: 5  Pain Type: Chronic pain Pain Location: Jaw Pain Orientation: Left, Right     Current Medications & Allergies (verified) Allergies as of 12/21/2019      Reactions   Macrobid [nitrofurantoin Monohyd Macro] Itching   Risperdal [risperidone] Other (See Comments)   Wt gain      Medication List       Accurate as of December 21, 2019 10:31 AM. If you have any questions, ask your nurse or doctor.        acetaminophen 500 MG tablet Commonly known as: TYLENOL Take 1,000 mg by mouth every 6 (six) hours as needed for mild pain or headache.   albuterol 108 (90 Base) MCG/ACT inhaler Commonly known as: VENTOLIN HFA Inhale 2 puffs into the lungs every 4 (four) hours as needed for wheezing or shortness of breath.   chlorthalidone 25 MG tablet Commonly known as: HYGROTON Take 1 tablet (25 mg total) by mouth daily.   dexlansoprazole 60 MG capsule Commonly known as: Dexilant Take 1 capsule (60 mg total) by mouth daily before breakfast.   IMMUNE SUPPORT PO Take by mouth.   methylphenidate 36 MG CR tablet Commonly known as: Concerta Take 1 tablet (36 mg total) by mouth daily.   methylphenidate 36 MG CR tablet Commonly known as: Concerta Take 1 tablet (36 mg total) by mouth daily. Start taking on: January 07, 2020   methylphenidate 36 MG CR tablet Commonly  known as: Concerta Take 1 tablet (36 mg total) by mouth daily. Start taking on: February 04, 2020       History (reviewed): Past Medical History:  Diagnosis Date  . Allergy    medicine  . Anemia   . Anxiety   . Bipolar disorder ([place])   . BV (bacterial vaginosis) 08/15/2014  . CSF leak   . Depression   . GERD (gastroesophageal reflux disease)   . Headache(784.0)   . Hypertension   . Low back pain  08/15/2014  . Lupus ([place])   . Obesity   . Ulcer   . Urinary frequency 08/30/2014  . Vaginal discharge 08/15/2014  . Vaginal irritation 02/12/2014  . Yeast infection 02/12/2014   Past Surgical History:  Procedure Laterality Date  . CHOLECYSTECTOMY  Feb 2015   Dr. Ladona Horns, [place]  . ENDOSCOPIC CONCHA BULLOSA RESECTION Left 07/20/2016   Procedure: ENDOSCOPIC LEFT  CONCHA BULLOSA RESECTION;  Surgeon: Leta Baptist, MD;  Location: [facility];  Service: ENT;  Laterality: Left;  . 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 ESOPHAGOGASTRODUODENOSCOPY N/A 05/06/2015   Procedure: ESOPHAGOGASTRODUODENOSCOPY (EGD);  Surgeon: Danie Binder, MD;  [facility];  Service: Endoscopy;  Laterality: N/A;  1300 - moved to 1:15 - office to notify  . ESOPHAGOGASTRODUODENOSCOPY (EGD) WITH ESOPHAGEAL DILATION N/A 02/11/2014   Dr. 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 EXCISION CHONCHA BULLOSA N/A 07/20/2016   Procedure: NASAL SEPTOPLASTY WITH BILATERAL TURBINATE REDUCTION;  Surgeon: Leta Baptist, MD;  Location: [facility];  Service: ENT;  Laterality: N/A;  . RHINOPLASTY    . SAVORY DILATION N/A 05/06/2015   Procedure: SAVORY DILATION;  Surgeon: Danie Binder, MD;  [facility];  Service: Endoscopy;  Laterality: N/A;  . TUBAL LIGATION     Family History  Problem Relation Age of Onset  . Depression Father   . Anxiety disorder Father   . Diabetes Father   . Hypertension Father   . Hyperlipidemia Father   . Kidney disease Father   . Mental illness Father   . Pneumonia Father   . Stroke Father   . Depression Sister   . Anxiety disorder Sister   . Cancer Sister        cervical  . Stroke Sister   . Depression Brother   . Anxiety disorder Brother   . Cancer - Other Paternal Grandfather           . Cancer Paternal Grandfather        prostate  .  Kidney disease Mother   . Diabetes Mother   . Cancer Mother        kidney  . Hyperlipidemia Mother   . Hypertension Mother   . Stroke Mother   . Aneurysm Maternal Grandfather   . Cancer Maternal Grandmother 73       breast  . Cancer Paternal Grandmother        bladder  . ADD / ADHD Neg Hx   . Alcohol abuse Neg Hx   . Drug abuse Neg Hx   . Bipolar disorder Neg Hx   . Dementia Neg Hx   . OCD Neg Hx   . Paranoid behavior Neg Hx   . Schizophrenia Neg Hx   . Seizures Neg Hx   . Sexual abuse Neg Hx   . Physical abuse Neg Hx   . Colon cancer Neg Hx  Social History   Socioeconomic History  . Marital status: Married    Spouse name: Nathaneil Canary  . Number of children: 2  . Years of education: 16  . Highest education level: Bachelor's degree (e.g., BA, AB, BS)  Occupational History  . Occupation: homehealth  Tobacco Use  . Smoking status: Never Smoker  . Smokeless tobacco: Never Used  Substance and Sexual Activity  . Alcohol use: No    Alcohol/week: 0.0 standard drinks  . Drug use: No  . Sexual activity: Yes    Birth control/protection: Surgical    Comment: tubal  Other Topics Concern  . Not on file  Social History Narrative   Working on Oceanographer in [employer]   Lives with husband and 2 children   One Neurosurgeon   Active in church   Social Determinants of Health   Financial Resource Strain: [place]   . Difficulty of Paying Living Expenses: Not hard at all  Food Insecurity: No Food Insecurity  . Worried About Charity fundraiser in the Last Year: Never true  . Ran Out of Food in the Last Year: Never true  Transportation Needs: No Transportation Needs  . Lack of Transportation (Medical): No  . Lack of Transportation (Non-Medical): No  Physical Activity: Insufficiently Active  . Days of Exercise per Week: 2 days  . Minutes of Exercise per Session: 60 min  Stress: No Stress Concern Present  . Feeling of Stress : Not at all  Social Connections: Not Isolated  .  Frequency of Communication with Friends and Family: More than three times a week  . Frequency of Social Gatherings with Friends and Family: More than three times a week  . Attends Religious Services: More than 4 times per year  . Active Member of Clubs or Organizations: Yes  . Attends Archivist Meetings: More than 4 times per year  . Marital Status: Married    Activities of Daily Living In your present state of health, do you have any difficulty performing the following activities: 12/21/2019  Hearing? Y  Comment "buzzing in ears" after tick bite, has been seen and evaluated  Vision? Y  Comment has trouble driving at night-wears glasses-gets yearly eye exams  Difficulty concentrating or making decisions? N  Walking or climbing stairs? N  Dressing or bathing? N  Doing errands, shopping? N  Preparing Food and eating ? N  Using the Toilet? N  In the past six months, have you accidently leaked urine? N  Do you have problems with loss of bowel control? N  Managing your Medications? N  Managing your Finances? N  Housekeeping or managing your Housekeeping? N  Some recent data might be hidden    Patient Education/ Literacy How often do you need to have someone help you when you read instructions, pamphlets, or other written materials from your doctor or pharmacy?: 1 - Never What is the last grade level you completed in school?: Bachelors Degree-Human Service Counseling  Exercise Current Exercise Habits: Home exercise routine, Type of exercise: walking, Time (Minutes): 60, Frequency (Times/Week): 2, Weekly Exercise (Minutes/Week): 120, Intensity: Mild, Exercise limited by: orthopedic condition(s)  Diet Patient reports consuming 2 meals a day and 0 snack(s) a day Patient reports that her primary diet is: Regular Patient reports that she does have regular access to food.   Depression Screen PHQ 2/9 Scores 12/21/2019 12/17/2019 09/14/2019 06/06/2019 12/25/2018 12/13/2018 09/28/2018    PHQ - 2 Score 0 0 0 0 0 0 0  PHQ- 9 Score 0 0 - - - - -     Fall Risk Fall Risk  12/21/2019 09/14/2019 12/25/2018 12/13/2018 09/28/2018  Falls in the past year? 0 0 0 0 0  Number falls in past yr: - 0 - - 0  Injury with Fall? - 0 - - 0     Objective:  Teresa Tran seemed alert and oriented and she participated appropriately during our telephone visit.  Blood Pressure Weight BMI  BP Readings from Last 3 Encounters:  12/17/19 128/79  09/14/19 120/79  08/28/19 135/84   Wt Readings from Last 3 Encounters:  12/17/19 175 lb 12.8 oz (79.7 kg)  09/14/19 209 lb 9.6 oz (95.1 kg)  06/06/19 237 lb 9.6 oz (107.8 kg)   BMI Readings from Last 1 Encounters:  12/17/19 34.33 kg/m    *Unable to obtain current vital signs, weight, and BMI due to telephone visit type  Hearing/Vision  . Teresa Tran did not seem to have difficulty with hearing/understanding during the telephone conversation . Reports that she has had a formal eye exam by an eye care professional within the past year . Reports that she has not had a formal hearing evaluation within the past year *Unable to fully assess hearing and vision during telephone visit type  Cognitive Function: 6CIT Screen 12/21/2019  What Year? 0 points  What month? 0 points  What time? 0 points  Count back from 20 0 points  Months in reverse 0 points  Repeat phrase 0 points  Total Score 0   (Normal:0-7, Significant for Dysfunction: >8)  Normal Cognitive Function Screening: Yes   Immunization & Health Maintenance Record Immunization History  Administered Date(s) Administered  . Influenza Split 08/09/2015, 08/10/2017  . Influenza,inj,Quad PF,6+ Mos 07/09/2019  . Influenza,inj,quad, With Preservative 08/08/2017, 08/31/2018  . Influenza-Unspecified 08/07/2014  . Tdap 11/17/2015    Health Maintenance  Topic Date Due  . FOOT EXAM  07/27/1992  . OPHTHALMOLOGY EXAM  09/13/2019  . PNEUMOCOCCAL POLYSACCHARIDE VACCINE AGE 47-64 HIGH RISK  12/16/2020  (Originally 07/27/1984)  . HEMOGLOBIN A1C  06/15/2020  . URINE MICROALBUMIN  12/16/2020  . PAP SMEAR-Modifier  09/28/2021  . TETANUS/TDAP  11/16/2025  . HIV Screening  Completed       Assessment  This is a routine wellness examination for Teresa Tran.  Health Maintenance: Due or Overdue Health Maintenance Due  Topic Date Due  . FOOT EXAM  07/27/1992  . OPHTHALMOLOGY EXAM  09/13/2019    Teresa Tran does not need a referral for Community Assistance: Care Management:   no Social Work:    no Prescription Assistance:  no Nutrition/Diabetes Education:  no   Plan:  Personalized Goals Goals Addressed            This Visit's Progress   . DIET - INCREASE WATER INTAKE       Try to drink 6-8 glasses of water daily      Personalized Health Maintenance & Screening Recommendations  Advanced directives: has NO advanced directive - not interested in additional information  Lung Cancer Screening Recommended: no (Low Dose CT Chest recommended if Age 20-80 years, 30 pack-year currently smoking OR have quit w/in past 15 years) Hepatitis C Screening recommended: no HIV Screening recommended: no  Advanced Directives: Written information was not prepared per patient's request.  Referrals & Orders No orders of the defined types were placed in this encounter.   Follow-up Plan . Follow-up with Dettinger, Fransisca Kaufmann, MD as planned  I have personally reviewed and noted the following in the patient's chart:   . Medical and social history . Use of alcohol, tobacco or illicit drugs  . Current medications and supplements . Functional ability and status . Nutritional status . Physical activity . Advanced directives . List of other physicians . Hospitalizations, surgeries, and ER visits in previous 12 months . Vitals . Screenings to include cognitive, depression, and falls . Referrals and appointments  In addition, I have reviewed and discussed with Teresa Tran certain preventive  protocols, quality metrics, and best practice recommendations. A written personalized care plan for preventive services as well as general preventive health recommendations is available and can be mailed to the patient at her request.      Milas Hock, LPN  [phone]

## 2019-12-21 NOTE — Telephone Encounter (Signed)
Patient calling for lab results, please advise.

## 2019-12-21 NOTE — Patient Instructions (Addendum)
 Preventive Care 21-39 Years Old, Female Preventive care refers to visits with your health care provider and lifestyle choices that can promote health and wellness. This includes:  A yearly physical exam. This may also be called an annual well check.  Regular dental visits and eye exams.  Immunizations.  Screening for certain conditions.  Healthy lifestyle choices, such as eating a healthy diet, getting regular exercise, not using drugs or products that contain nicotine and tobacco, and limiting alcohol use. What can I expect for my preventive care visit? Physical exam Your health care provider will check your:  Height and weight. This may be used to calculate body mass index (BMI), which tells if you are at a healthy weight.  Heart rate and blood pressure.  Skin for abnormal spots. Counseling Your health care provider may ask you questions about your:  Alcohol, tobacco, and drug use.  Emotional well-being.  Home and relationship well-being.  Sexual activity.  Eating habits.  Work and work environment.  Method of birth control.  Menstrual cycle.  Pregnancy history. What immunizations do I need?  Influenza (flu) vaccine  This is recommended every year. Tetanus, diphtheria, and pertussis (Tdap) vaccine  You may need a Td booster every 10 years. Varicella (chickenpox) vaccine  You may need this if you have not been vaccinated. Human papillomavirus (HPV) vaccine  If recommended by your health care provider, you may need three doses over 6 months. Measles, mumps, and rubella (MMR) vaccine  You may need at least one dose of MMR. You may also need a second dose. Meningococcal conjugate (MenACWY) vaccine  One dose is recommended if you are age 19-21 years and a first-year college student living in a residence hall, or if you have one of several medical conditions. You may also need additional booster doses. Pneumococcal conjugate (PCV13) vaccine  You may need  this if you have certain conditions and were not previously vaccinated. Pneumococcal polysaccharide (PPSV23) vaccine  You may need one or two doses if you smoke cigarettes or if you have certain conditions. Hepatitis A vaccine  You may need this if you have certain conditions or if you travel or work in places where you may be exposed to hepatitis A. Hepatitis B vaccine  You may need this if you have certain conditions or if you travel or work in places where you may be exposed to hepatitis B. Haemophilus influenzae type b (Hib) vaccine  You may need this if you have certain conditions. You may receive vaccines as individual doses or as more than one vaccine together in one shot (combination vaccines). Talk with your health care provider about the risks and benefits of combination vaccines. What tests do I need?  Blood tests  Lipid and cholesterol levels. These may be checked every 5 years starting at age 20.  Hepatitis C test.  Hepatitis B test. Screening  Diabetes screening. This is done by checking your blood sugar (glucose) after you have not eaten for a while (fasting).  Sexually transmitted disease (STD) testing.  BRCA-related cancer screening. This may be done if you have a family history of breast, ovarian, tubal, or peritoneal cancers.  Pelvic exam and Pap test. This may be done every 3 years starting at age 21. Starting at age 30, this may be done every 5 years if you have a Pap test in combination with an HPV test. Talk with your health care provider about your test results, treatment options, and if necessary, the need for more   tests. Follow these instructions at home: Eating and drinking   Eat a diet that includes fresh fruits and vegetables, whole grains, lean protein, and low-fat dairy.  Take vitamin and mineral supplements as recommended by your health care provider.  Do not drink alcohol if: ? Your health care provider tells you not to drink. ? You are  pregnant, may be pregnant, or are planning to become pregnant.  If you drink alcohol: ? Limit how much you have to 0-1 drink a day. ? Be aware of how much alcohol is in your drink. In the [place] one drink equals one 12 oz bottle of beer (355 mL), one 5 oz glass of wine (148 mL), or one 1 oz glass of hard liquor (44 mL). Lifestyle  Take daily care of your teeth and gums.  Stay active. Exercise for at least 30 minutes on 5 or more days each week.  Do not use any products that contain nicotine or tobacco, such as cigarettes, e-cigarettes, and chewing tobacco. If you need help quitting, ask your health care provider.  If you are sexually active, practice safe sex. Use a condom or other form of birth control (contraception) in order to prevent pregnancy and STIs (sexually transmitted infections). If you plan to become pregnant, see your health care provider for a preconception visit. What's next?  Visit your health care provider once a year for a well check visit.  Ask your health care provider how often you should have your eyes and teeth checked.  Stay up to date on all vaccines. This information is not intended to replace advice given to you by your health care provider. Make sure you discuss any questions you have with your health care provider. Document Revised: 07/06/2018 Document Reviewed: 07/06/2018 Elsevier Patient Education  2020 Elsevier Inc.  

## 2019-12-21 NOTE — Telephone Encounter (Signed)
I put in a lab result

## 2019-12-24 ENCOUNTER — Other Ambulatory Visit: Payer: Self-pay

## 2019-12-24 ENCOUNTER — Ambulatory Visit (INDEPENDENT_AMBULATORY_CARE_PROVIDER_SITE_OTHER): Payer: Medicare Other | Admitting: Podiatry

## 2019-12-24 ENCOUNTER — Telehealth: Payer: Self-pay | Admitting: Psychiatry

## 2019-12-24 DIAGNOSIS — S90221A Contusion of right lesser toe(s) with damage to nail, initial encounter: Secondary | ICD-10-CM

## 2019-12-24 DIAGNOSIS — F908 Attention-deficit hyperactivity disorder, other type: Secondary | ICD-10-CM

## 2019-12-24 NOTE — Telephone Encounter (Signed)
Pt called and wants to reduce her Concerta. She states she is having fast heart rate and chest pain. She missed taking the dose yesterday 12/23/2019 and side effects subsided.

## 2019-12-24 NOTE — Addendum Note (Signed)
Addended by: Caryl Pina on: 12/24/2019 09:47 AM   Modules accepted: Orders

## 2019-12-25 ENCOUNTER — Ambulatory Visit (INDEPENDENT_AMBULATORY_CARE_PROVIDER_SITE_OTHER): Payer: Medicare Other | Admitting: Adult Health

## 2019-12-25 ENCOUNTER — Encounter: Payer: Self-pay | Admitting: Adult Health

## 2019-12-25 ENCOUNTER — Other Ambulatory Visit: Payer: Self-pay

## 2019-12-25 VITALS — BP 125/79 | HR 85 | Ht 60.0 in | Wt 179.8 lb

## 2019-12-25 DIAGNOSIS — N6322 Unspecified lump in the left breast, upper inner quadrant: Secondary | ICD-10-CM | POA: Insufficient documentation

## 2019-12-25 DIAGNOSIS — Z01419 Encounter for gynecological examination (general) (routine) without abnormal findings: Secondary | ICD-10-CM

## 2019-12-25 DIAGNOSIS — B369 Superficial mycosis, unspecified: Secondary | ICD-10-CM | POA: Insufficient documentation

## 2019-12-25 HISTORY — DX: Encounter for gynecological examination (general) (routine) without abnormal findings: Z01.419

## 2019-12-25 MED ORDER — NYSTATIN 100000 UNIT/GM EX POWD: 1.0000 "application " | Freq: Three times a day (TID) | CUTANEOUS | 3 refills | Status: DC

## 2019-12-25 MED ORDER — METHYLPHENIDATE HCL ER (OSM) 27 MG PO TBCR: 27.0000 mg | EXTENDED_RELEASE_TABLET | ORAL | 0 refills | Status: DC

## 2019-12-25 NOTE — Progress Notes (Signed)
Patient ID: Teresa Tran, female   DOB: 09/30/1981, 39 y.o.   MRN: [id number] History of Present Illness: Teresa Tran is a 39 year old white female, married, [id number], in for a well woman gyn exam, she had a normal pap with negative HPV 09/28/18. She has lost about 63 lbs since last summer by making better choices. PCP is Dr Warrick Parisian.   Current Medications, Allergies, Past Medical History, Past Surgical History, Family History and Social History were reviewed in [employer] record.     Review of Systems: Patient denies any headaches, hearing loss, fatigue, blurred vision, shortness of breath, chest pain, abdominal pain, problems with bowel movements, urination, or intercourse. No joint pain or mood swings. Periods may be a little heavier at times.    Physical Exam:BP 125/79 (BP Location: Left Arm, Patient Position: Sitting, Cuff Size: Normal)   Pulse 85   Ht 5' (1.524 m)   Wt 179 lb 12.8 oz (81.6 kg)   LMP 12/16/2019 (Exact Date)   BMI 35.11 kg/m  General:  Well developed, well nourished, no acute distress Skin:  Warm and dry Neck:  Midline trachea, normal thyroid, good ROM, no lymphadenopathy Lungs; Clear to auscultation bilaterally Breast:  No dominant palpable mass, retraction, or nipple discharge, on right, has regular irregularities both breasts, no left no retraction or nipple discharge but has pea sized mass at 10 o'clock, tender, itches between breasts at times Cardiovascular: Regular rate and rhythm Abdomen:  Soft, non tender, no hepatosplenomegaly, has skin fungus under paniculus Pelvic:  External genitalia is normal in appearance, no lesions.  The vagina is normal in appearance. Urethra has no lesions or masses. The cervix is bulbous.  Uterus is felt to be normal size, shape, and contour.  No adnexal masses or tenderness noted.Bladder is non tender, no masses felt. Extremities/musculoskeletal:  No swelling or varicosities noted, no clubbing or cyanosis Psych:   No mood changes, alert and cooperative,seems happy Fall risk is low PHQ 2 score is 2 Examination chaperoned by Terri Skains NP stuident, who assisted with exam.  Impression and Plan:  1. Encounter for well woman exam with routine gynecological exam Pap and physical in 1 year  2. Mass of upper inner quadrant of left breast Scheduled Diagnotic mammogram and right and left [name] at [facility] 01/08/20 at 11 am Discussed with her that she has fibroglandular tissue of both breasts   3. Infection of skin and subcutaneous tissue due to fungus Meds ordered this encounter  Medications  . nystatin (MYCOSTATIN/NYSTOP) powder    Sig: Apply 1 application topically 3 (three) times daily.    Dispense:  30 g    Refill:  3    Order Specific Question:   Supervising Provider    Answer:   Tania Ade H [[id number]]

## 2019-12-25 NOTE — Telephone Encounter (Signed)
Patient aware and advised to call back with worsening symptoms

## 2019-12-26 DIAGNOSIS — M791 Myalgia, unspecified site: Secondary | ICD-10-CM | POA: Diagnosis not present

## 2019-12-26 DIAGNOSIS — M9903 Segmental and somatic dysfunction of lumbar region: Secondary | ICD-10-CM | POA: Diagnosis not present

## 2019-12-26 DIAGNOSIS — M9902 Segmental and somatic dysfunction of thoracic region: Secondary | ICD-10-CM | POA: Diagnosis not present

## 2019-12-26 DIAGNOSIS — M545 Low back pain: Secondary | ICD-10-CM | POA: Diagnosis not present

## 2019-12-26 DIAGNOSIS — M5413 Radiculopathy, cervicothoracic region: Secondary | ICD-10-CM | POA: Diagnosis not present

## 2019-12-26 DIAGNOSIS — M9901 Segmental and somatic dysfunction of cervical region: Secondary | ICD-10-CM | POA: Diagnosis not present

## 2019-12-26 DIAGNOSIS — M542 Cervicalgia: Secondary | ICD-10-CM | POA: Diagnosis not present

## 2019-12-26 NOTE — Progress Notes (Signed)
   HPI: 39 y.o. female presenting today as a new patient with a chief complaint of discoloration to the right great toenail that appeared about 5 months ago. She states the area is becoming darker and she is concerned for fungus. She has not done anything for treatment and denies any modifying factors. She denies pain. Patient is here for further evaluation and treatment.   Past Medical History:  Diagnosis Date  . Allergy    medicine  . Anemia   . Anxiety   . Bipolar disorder ([place])   . BV (bacterial vaginosis) 08/15/2014  . CSF leak   . Depression   . GERD (gastroesophageal reflux disease)   . Headache(784.0)   . Hypertension   . Low back pain 08/15/2014  . Lupus ([place])   . Obesity   . Ulcer   . Urinary frequency 08/30/2014  . Vaginal discharge 08/15/2014  . Vaginal irritation 02/12/2014  . Yeast infection 02/12/2014     Physical Exam: General: The patient is alert and oriented x3 in no acute distress.  Dermatology: Subungual hematoma noted to the right hallux nail plate. Skin is warm, dry and supple bilateral lower extremities. Negative for open lesions or macerations.  Vascular: Palpable pedal pulses bilaterally. No edema or erythema noted. Capillary refill within normal limits.  Neurological: Epicritic and protective threshold grossly intact bilaterally.   Musculoskeletal Exam: Range of motion within normal limits to all pedal and ankle joints bilateral. Muscle strength 5/5 in all groups bilateral.   Assessment: 1. Subungual hematoma right hallux nail plate   Plan of Care:  1. Patient evaluated.  2. Explained that there is nothing concerning regarding the hematoma.  Subungual hematoma should grow out with time as the nail plate grows out 3.  Return to clinic as needed      Edrick Kins, DPM [facility]  Dr. Edrick Kins, DPM    [address], [place] [address]                Office [phone]  Fax  [phone]

## 2019-12-31 ENCOUNTER — Other Ambulatory Visit: Payer: Self-pay

## 2019-12-31 ENCOUNTER — Ambulatory Visit (HOSPITAL_COMMUNITY)
Admission: RE | Admit: 2019-12-31 | Discharge: 2019-12-31 | Disposition: A | Discharge status: Home / Self Care | Payer: Medicare Other | Source: Ambulatory Visit | Attending: Family Medicine | Admitting: Family Medicine

## 2019-12-31 DIAGNOSIS — N2889 Other specified disorders of kidney and ureter: Secondary | ICD-10-CM | POA: Insufficient documentation

## 2020-01-02 DIAGNOSIS — M9902 Segmental and somatic dysfunction of thoracic region: Secondary | ICD-10-CM | POA: Diagnosis not present

## 2020-01-02 DIAGNOSIS — M542 Cervicalgia: Secondary | ICD-10-CM | POA: Diagnosis not present

## 2020-01-02 DIAGNOSIS — M545 Low back pain: Secondary | ICD-10-CM | POA: Diagnosis not present

## 2020-01-02 DIAGNOSIS — M791 Myalgia, unspecified site: Secondary | ICD-10-CM | POA: Diagnosis not present

## 2020-01-02 DIAGNOSIS — M5413 Radiculopathy, cervicothoracic region: Secondary | ICD-10-CM | POA: Diagnosis not present

## 2020-01-02 DIAGNOSIS — M9903 Segmental and somatic dysfunction of lumbar region: Secondary | ICD-10-CM | POA: Diagnosis not present

## 2020-01-02 DIAGNOSIS — M9901 Segmental and somatic dysfunction of cervical region: Secondary | ICD-10-CM | POA: Diagnosis not present

## 2020-01-08 ENCOUNTER — Ambulatory Visit (HOSPITAL_COMMUNITY)
Admission: RE | Admit: 2020-01-08 | Discharge: 2020-01-08 | Disposition: A | Discharge status: Home / Self Care | Payer: Medicare Other | Source: Ambulatory Visit | Attending: Adult Health | Admitting: Adult Health

## 2020-01-08 ENCOUNTER — Ambulatory Visit (HOSPITAL_COMMUNITY): Payer: Medicare Other

## 2020-01-08 ENCOUNTER — Other Ambulatory Visit: Payer: Self-pay

## 2020-01-08 DIAGNOSIS — N6322 Unspecified lump in the left breast, upper inner quadrant: Secondary | ICD-10-CM | POA: Insufficient documentation

## 2020-01-08 DIAGNOSIS — R928 Other abnormal and inconclusive findings on diagnostic imaging of breast: Secondary | ICD-10-CM | POA: Diagnosis not present

## 2020-01-08 DIAGNOSIS — N6489 Other specified disorders of breast: Secondary | ICD-10-CM | POA: Diagnosis not present

## 2020-01-09 DIAGNOSIS — M9902 Segmental and somatic dysfunction of thoracic region: Secondary | ICD-10-CM | POA: Diagnosis not present

## 2020-01-09 DIAGNOSIS — M545 Low back pain: Secondary | ICD-10-CM | POA: Diagnosis not present

## 2020-01-09 DIAGNOSIS — M9901 Segmental and somatic dysfunction of cervical region: Secondary | ICD-10-CM | POA: Diagnosis not present

## 2020-01-09 DIAGNOSIS — M9903 Segmental and somatic dysfunction of lumbar region: Secondary | ICD-10-CM | POA: Diagnosis not present

## 2020-01-09 DIAGNOSIS — M542 Cervicalgia: Secondary | ICD-10-CM | POA: Diagnosis not present

## 2020-01-09 DIAGNOSIS — M791 Myalgia, unspecified site: Secondary | ICD-10-CM | POA: Diagnosis not present

## 2020-01-09 DIAGNOSIS — M5413 Radiculopathy, cervicothoracic region: Secondary | ICD-10-CM | POA: Diagnosis not present

## 2020-01-16 DIAGNOSIS — M9901 Segmental and somatic dysfunction of cervical region: Secondary | ICD-10-CM | POA: Diagnosis not present

## 2020-01-16 DIAGNOSIS — M791 Myalgia, unspecified site: Secondary | ICD-10-CM | POA: Diagnosis not present

## 2020-01-16 DIAGNOSIS — M545 Low back pain: Secondary | ICD-10-CM | POA: Diagnosis not present

## 2020-01-16 DIAGNOSIS — M9902 Segmental and somatic dysfunction of thoracic region: Secondary | ICD-10-CM | POA: Diagnosis not present

## 2020-01-16 DIAGNOSIS — M9903 Segmental and somatic dysfunction of lumbar region: Secondary | ICD-10-CM | POA: Diagnosis not present

## 2020-01-16 DIAGNOSIS — M542 Cervicalgia: Secondary | ICD-10-CM | POA: Diagnosis not present

## 2020-01-16 DIAGNOSIS — M5413 Radiculopathy, cervicothoracic region: Secondary | ICD-10-CM | POA: Diagnosis not present

## 2020-01-22 ENCOUNTER — Encounter: Payer: Self-pay | Admitting: Gastroenterology

## 2020-01-22 ENCOUNTER — Ambulatory Visit (INDEPENDENT_AMBULATORY_CARE_PROVIDER_SITE_OTHER): Payer: Medicare Other | Admitting: Gastroenterology

## 2020-01-22 ENCOUNTER — Other Ambulatory Visit: Payer: Self-pay | Admitting: *Deleted

## 2020-01-22 VITALS — BP 122/72 | HR 92 | Temp 97.1°F | Ht 60.0 in | Wt 177.0 lb

## 2020-01-22 DIAGNOSIS — D8989 Other specified disorders involving the immune mechanism, not elsewhere classified: Secondary | ICD-10-CM | POA: Diagnosis not present

## 2020-01-22 DIAGNOSIS — M5413 Radiculopathy, cervicothoracic region: Secondary | ICD-10-CM | POA: Diagnosis not present

## 2020-01-22 DIAGNOSIS — M9902 Segmental and somatic dysfunction of thoracic region: Secondary | ICD-10-CM | POA: Diagnosis not present

## 2020-01-22 DIAGNOSIS — M9903 Segmental and somatic dysfunction of lumbar region: Secondary | ICD-10-CM | POA: Diagnosis not present

## 2020-01-22 DIAGNOSIS — M545 Low back pain: Secondary | ICD-10-CM | POA: Diagnosis not present

## 2020-01-22 DIAGNOSIS — K219 Gastro-esophageal reflux disease without esophagitis: Secondary | ICD-10-CM

## 2020-01-22 DIAGNOSIS — M791 Myalgia, unspecified site: Secondary | ICD-10-CM | POA: Diagnosis not present

## 2020-01-22 DIAGNOSIS — K760 Fatty (change of) liver, not elsewhere classified: Secondary | ICD-10-CM

## 2020-01-22 DIAGNOSIS — M542 Cervicalgia: Secondary | ICD-10-CM | POA: Diagnosis not present

## 2020-01-22 DIAGNOSIS — M9901 Segmental and somatic dysfunction of cervical region: Secondary | ICD-10-CM | POA: Diagnosis not present

## 2020-01-22 MED ORDER — DEXILANT 60 MG PO CPDR: 60.0000 mg | DELAYED_RELEASE_CAPSULE | Freq: Every day | ORAL | 11 refills | Status: DC

## 2020-01-22 NOTE — Progress Notes (Signed)
Cc'ed to pcp °

## 2020-01-22 NOTE — Patient Instructions (Signed)
1. Continue Dexilant 60mg  daily before breakfast.  Prescription sent to your pharmacy. 2. In October we will plan on updating your ultrasound and lab work.  We will send you a reminder letters. 3. Congratulations on your weight loss, please keep up the good work.   Fatty Liver Disease  Fatty liver disease occurs when too much fat has built up in your liver cells. Fatty liver disease is also called hepatic steatosis or steatohepatitis. The liver removes harmful substances from your bloodstream and produces fluids that your body needs. It also helps your body use and store energy from the food you eat. In many cases, fatty liver disease does not cause symptoms or problems. It is often diagnosed when tests are being done for other reasons. However, over time, fatty liver can cause inflammation that may lead to more serious liver problems, such as scarring of the liver (cirrhosis) and liver failure. Fatty liver is associated with insulin resistance, increased body fat, high blood pressure (hypertension), and high cholesterol. These are features of metabolic syndrome and increase your risk for stroke, diabetes, and heart disease. What are the causes? This condition may be caused by:  Drinking too much alcohol.  Poor nutrition.  Obesity.  Cushing's syndrome.  Diabetes.  High cholesterol.  Certain drugs.  Poisons.  Some viral infections.  Pregnancy. What increases the risk? You are more likely to develop this condition if you:  Abuse alcohol.  Are overweight.  Have diabetes.  Have hepatitis.  Have a high triglyceride level.  Are pregnant. What are the signs or symptoms? Fatty liver disease often does not cause symptoms. If symptoms do develop, they can include:  Fatigue.  Weakness.  Weight loss.  Confusion.  Abdominal pain.  Nausea and vomiting.  Yellowing of your skin and the white parts of your eyes (jaundice).  Itchy skin. How is this diagnosed? This  condition may be diagnosed by:  A physical exam and medical history.  Blood tests.  Imaging tests, such as an ultrasound, CT scan, or MRI.  A liver biopsy. A small sample of liver tissue is removed using a needle. The sample is then looked at under a microscope. How is this treated? Fatty liver disease is often caused by other health conditions. Treatment for fatty liver may involve medicines and lifestyle changes to manage conditions such as:  Alcoholism.  High cholesterol.  Diabetes.  Being overweight or obese. Follow these instructions at home:   Do not drink alcohol. If you have trouble quitting, ask your health care provider how to safely quit with the help of medicine or a supervised program. This is important to keep your condition from getting worse.  Eat a healthy diet as told by your health care provider. Ask your health care provider about working with a diet and nutrition specialist (dietitian) to develop an eating plan.  Exercise regularly. This can help you lose weight and control your cholesterol and diabetes. Talk to your health care provider about an exercise plan and which activities are best for you.  Take over-the-counter and prescription medicines only as told by your health care provider.  Keep all follow-up visits as told by your health care provider. This is important. Contact a health care provider if: You have trouble controlling your:  Blood sugar. This is especially important if you have diabetes.  Cholesterol.  Drinking of alcohol. Get help right away if:  You have abdominal pain.  You have jaundice.  You have nausea and vomiting.  You vomit  blood or material that looks like coffee grounds.  You have stools that are black, tar-like, or bloody. Summary  Fatty liver disease develops when too much fat builds up in the cells of your liver.  Fatty liver disease often causes no symptoms or problems. However, over time, fatty liver can cause  inflammation that may lead to more serious liver problems, such as scarring of the liver (cirrhosis).  You are more likely to develop this condition if you abuse alcohol, are pregnant, are overweight, have diabetes, have hepatitis, or have high triglyceride levels.  Contact your health care provider if you have trouble controlling your weight, blood sugar, cholesterol, or drinking of alcohol. This information is not intended to replace advice given to you by your health care provider. Make sure you discuss any questions you have with your health care provider. Document Revised: 10/07/2017 Document Reviewed: 08/03/2017 Elsevier Patient Education  2020 [employer].

## 2020-01-22 NOTE — Assessment & Plan Note (Signed)
Doing well on Dexilant.  Resume 60 mg daily.  New prescription provided.  Plan to see her back in 1 year.

## 2020-01-22 NOTE — Assessment & Plan Note (Signed)
Fatty liver by ultrasound in 2018.  Also with history of hepatic hemangioma.  LFTs have been normal.  Dramatic weight loss will be beneficial for her fatty liver.  Plans for updated labs (LFTs, AMA) and right upper quadrant ultrasound in October.  Because of her autoimmune disorder, she would like to be checked for PBC.

## 2020-01-22 NOTE — Progress Notes (Signed)
Primary Care Physician: Dettinger, Fransisca Kaufmann, MD  Primary Gastroenterologist:  Barney Drain, MD   Chief Complaint  Patient presents with  . Gastroesophageal Reflux    out of dexilant x 4 weeks    HPI: Teresa Tran is a 39 y.o. female here for follow-up.  She has a history of fatty liver, GERD, autoimmune disorders.  Last EGD in 2016, possible esophageal web status post dilation, nonerosive gastritis.  Ultrasound in October 2018 with 7 mm tiny hyperechoic nodular density again noted in the right lobe of the liver consistent with tiny hemangioma.  Fatty liver noted.  She has history of positive ANA, homogenous pattern.  Followed by PCP and [facility].  LFTs have been normal recently.  She was doing well prior to coming off of Dexilant about 4 weeks ago.  Her prescription expired and the pharmacy advised that she would need to follow-up with [name].  We received no communications.  In the past 9 months she has lost from 237 pounds down to 177 pounds.  She is made significant dietary changes, exercising regularly.  Overall feeling better.  Since she has been out of Dexilant she has had more bloating, less productive bowel movements, reflux issues.  She has dysphagia to bread only.  She no longer eats it due to weight loss endeavors.  Bowel movements regular just less productive.  No melena or rectal bleeding.  No abdominal pain.   Current Outpatient Medications  Medication Sig Dispense Refill  . acetaminophen (TYLENOL) 500 MG tablet Take 1,000 mg by mouth every 6 (six) hours as needed for mild pain or headache.    . albuterol (PROVENTIL HFA;VENTOLIN HFA) 108 (90 Base) MCG/ACT inhaler Inhale 2 puffs into the lungs every 4 (four) hours as needed for wheezing or shortness of breath. 1 Inhaler 2  . chlorthalidone (HYGROTON) 25 MG tablet Take 1 tablet (25 mg total) by mouth daily. 90 tablet 3  . methylphenidate (CONCERTA) 27 MG PO CR tablet Take 1 tablet (27 mg total) by mouth every morning. 30 tablet 0    . Multiple Vitamins-Minerals (IMMUNE SUPPORT PO) Take by mouth.    . nystatin (MYCOSTATIN/NYSTOP) powder Apply 1 application topically 3 (three) times daily. (Patient taking differently: Apply 1 application topically as needed. ) 30 g 3  . dexlansoprazole (DEXILANT) 60 MG capsule Take 1 capsule (60 mg total) by mouth daily before breakfast. (Patient not taking: Reported on 01/22/2020) 120 capsule 3   No current facility-administered medications for this visit.    Allergies as of 01/22/2020 - Review Complete 01/22/2020  Allergen Reaction Noted  . Macrobid [nitrofurantoin monohyd macro] Itching 01/09/2014  . Risperdal [risperidone] Other (See Comments) 10/11/2012    ROS:  General: Negative for anorexia, weight loss, fever, chills, fatigue, weakness. ENT: Negative for hoarseness, difficulty swallowing , nasal congestion. CV: Negative for chest pain, angina, palpitations, dyspnea on exertion, peripheral edema.  Respiratory: Negative for dyspnea at rest, dyspnea on exertion, cough, sputum, wheezing.  GI: See history of present illness. GU:  Negative for dysuria, hematuria, urinary incontinence, urinary frequency, nocturnal urination.  Endo: Negative for unusual weight change.    Physical Examination:   BP 122/72   Pulse 92   Temp (!) 97.1 F (36.2 C) (Oral)   Ht 5' (1.524 m)   Wt 177 lb (80.3 kg)   LMP 12/17/2019   BMI 34.57 kg/m   General: Well-nourished, well-developed in no acute distress.  Eyes: No icterus. Mouth: masked Abdomen: Bowel sounds are  normal, nontender, nondistended, no hepatosplenomegaly or masses, no abdominal bruits or hernia , no rebound or guarding.   Extremities: No lower extremity edema. No clubbing or deformities. Neuro: Alert and oriented x 4   Skin: Warm and dry, no jaundice.   Psych: Alert and cooperative, normal mood and affect.  Labs:  Lab Results  Component Value Date   CREATININE 0.79 12/17/2019   BUN 10 12/17/2019   NA 142 12/17/2019   K  3.3 (L) 12/17/2019   CL 100 12/17/2019   CO2 19 (L) 12/17/2019   Lab Results  Component Value Date   ALT 30 12/17/2019   AST 28 12/17/2019   ALKPHOS 79 12/17/2019   BILITOT 0.5 12/17/2019   Lab Results  Component Value Date   WBC 6.7 12/17/2019   HGB 13.6 12/17/2019   HCT 40.7 12/17/2019   MCV 78 (L) 12/17/2019   PLT 210 12/17/2019   Lab Results  Component Value Date   TSH 2.870 12/17/2019     Imaging Studies: US Renal  Result Date: 01/01/2020 CLINICAL DATA:  Follow-up examination for renal scarring. EXAM: RENAL / URINARY TRACT ULTRASOUND COMPLETE COMPARISON:  Comparison made with prior ultrasound from 10/25/2017 as well as previous CT from 10/09/2016. FINDINGS: Right Kidney: Renal measurements: 10.3 x 5.5 x 5.3 cm = volume: 157.2 mL. Echogenicity within normal limits. Focal cortical thinning and scarring present at the upper pole, unchanged from prior CT. No new or progressive scarring identified. No nephrolithiasis or hydronephrosis. No focal renal mass. Left Kidney: Renal measurements: 10.3 x 5.6 x 5.5 cm = volume: 165.8 mL. Echogenicity within normal limits. No appreciable cortical thinning or scarring. No mass or hydronephrosis visualized. No visible nephrolithiasis. Bladder: Appears normal for degree of bladder distention. Bilateral ureteral jets are visualized. Other: None. IMPRESSION: 1. Focal cortical scarring involving the upper pole of the right kidney, grossly stable as compared to prior CT from 2017. No new or progressive scarring identified. 2. Otherwise unremarkable renal ultrasound. Electronically Signed   By: Jeannine Boga M.D.   On: 01/01/2020 00:03   US BREAST LTD UNI LEFT INC AXILLA  Result Date: 01/08/2020 CLINICAL DATA:  40 year old presents evaluation of a palpable area of concern in the upper inner quadrant of the left breast. She has had approximately 60 pound intentional weight loss recently. She reports that her sister has a history of ovarian cancer.  EXAM: DIGITAL DIAGNOSTIC BILATERAL MAMMOGRAM WITH CAD AND TOMO ULTRASOUND LEFT BREAST COMPARISON:  Previous exam 07/27/2016 ACR Breast Density Category b: There are scattered areas of fibroglandular density. FINDINGS: Metallic skin marker placed in the region of patient concern in the upper inner quadrant of the left breast. Mammographic images were processed with CAD. On physical exam, I do palpate a probable prominent fat lobule in the 10 o'clock region of the left breast where the patient has noticed a lump, approximately 6 cm from nipple. On exam of the surrounding tissue in the upper inner quadrant there are other similar soft areas of nodularity. Targeted ultrasound is performed, showing normal fatty breast parenchyma in the 10 o'clock axis of the left breast centered 6 cm from the nipple. No solid or cystic mass or abnormal shadowing is identified in the upper inner quadrant of the left breast. IMPRESSION: No evidence of malignancy in either breast. RECOMMENDATION: Screening mammogram in one year.(Code:SM-B-01Y). The patient and I discussed possibility of genetic counseling, given that her sister reportedly has a history of ovarian cancer, and her mother has a history of renal  cancer. I have discussed the findings and recommendations with the patient. If applicable, a reminder letter will be sent to the patient regarding the next appointment. BI-RADS CATEGORY  1: Negative. Electronically Signed   By: Curlene Dolphin M.D.   On: 01/08/2020 12:06   MM DIAG BREAST TOMO BILATERAL  Result Date: 01/08/2020 CLINICAL DATA:  39 year old presents evaluation of a palpable area of concern in the upper inner quadrant of the left breast. She has had approximately 60 pound intentional weight loss recently. She reports that her sister has a history of ovarian cancer. EXAM: DIGITAL DIAGNOSTIC BILATERAL MAMMOGRAM WITH CAD AND TOMO ULTRASOUND LEFT BREAST COMPARISON:  Previous exam 07/27/2016 ACR Breast Density Category b: There  are scattered areas of fibroglandular density. FINDINGS: Metallic skin marker placed in the region of patient concern in the upper inner quadrant of the left breast. Mammographic images were processed with CAD. On physical exam, I do palpate a probable prominent fat lobule in the 10 o'clock region of the left breast where the patient has noticed a lump, approximately 6 cm from nipple. On exam of the surrounding tissue in the upper inner quadrant there are other similar soft areas of nodularity. Targeted ultrasound is performed, showing normal fatty breast parenchyma in the 10 o'clock axis of the left breast centered 6 cm from the nipple. No solid or cystic mass or abnormal shadowing is identified in the upper inner quadrant of the left breast. IMPRESSION: No evidence of malignancy in either breast. RECOMMENDATION: Screening mammogram in one year.(Code:SM-B-01Y). The patient and I discussed possibility of genetic counseling, given that her sister reportedly has a history of ovarian cancer, and her mother has a history of renal cancer. I have discussed the findings and recommendations with the patient. If applicable, a reminder letter will be sent to the patient regarding the next appointment. BI-RADS CATEGORY  1: Negative. Electronically Signed   By: Curlene Dolphin M.D.   On: 01/08/2020 12:06

## 2020-01-23 DIAGNOSIS — H18599 Other hereditary corneal dystrophies, unspecified eye: Secondary | ICD-10-CM | POA: Diagnosis not present

## 2020-01-30 DIAGNOSIS — M5413 Radiculopathy, cervicothoracic region: Secondary | ICD-10-CM | POA: Diagnosis not present

## 2020-01-30 DIAGNOSIS — M9901 Segmental and somatic dysfunction of cervical region: Secondary | ICD-10-CM | POA: Diagnosis not present

## 2020-01-30 DIAGNOSIS — M9903 Segmental and somatic dysfunction of lumbar region: Secondary | ICD-10-CM | POA: Diagnosis not present

## 2020-01-30 DIAGNOSIS — M542 Cervicalgia: Secondary | ICD-10-CM | POA: Diagnosis not present

## 2020-01-30 DIAGNOSIS — M9902 Segmental and somatic dysfunction of thoracic region: Secondary | ICD-10-CM | POA: Diagnosis not present

## 2020-01-30 DIAGNOSIS — M791 Myalgia, unspecified site: Secondary | ICD-10-CM | POA: Diagnosis not present

## 2020-01-30 DIAGNOSIS — M545 Low back pain: Secondary | ICD-10-CM | POA: Diagnosis not present

## 2020-02-04 ENCOUNTER — Telehealth: Payer: Self-pay | Admitting: Psychiatry

## 2020-02-04 ENCOUNTER — Other Ambulatory Visit: Payer: Self-pay

## 2020-02-04 DIAGNOSIS — F908 Attention-deficit hyperactivity disorder, other type: Secondary | ICD-10-CM

## 2020-02-04 MED ORDER — METHYLPHENIDATE HCL ER (OSM) 27 MG PO TBCR: 27.0000 mg | EXTENDED_RELEASE_TABLET | ORAL | 0 refills | Status: DC

## 2020-02-04 NOTE — Telephone Encounter (Signed)
Pt called requesting refill for generic Concerta 27 mg. Pharmacy does not have Rx for 27 mg and higher dose makes heart race. Pt out. [facility] [place]. Need refills until appt 5/3

## 2020-02-06 DIAGNOSIS — M791 Myalgia, unspecified site: Secondary | ICD-10-CM | POA: Diagnosis not present

## 2020-02-06 DIAGNOSIS — M9901 Segmental and somatic dysfunction of cervical region: Secondary | ICD-10-CM | POA: Diagnosis not present

## 2020-02-06 DIAGNOSIS — M545 Low back pain: Secondary | ICD-10-CM | POA: Diagnosis not present

## 2020-02-06 DIAGNOSIS — M9903 Segmental and somatic dysfunction of lumbar region: Secondary | ICD-10-CM | POA: Diagnosis not present

## 2020-02-06 DIAGNOSIS — M9902 Segmental and somatic dysfunction of thoracic region: Secondary | ICD-10-CM | POA: Diagnosis not present

## 2020-02-06 DIAGNOSIS — M542 Cervicalgia: Secondary | ICD-10-CM | POA: Diagnosis not present

## 2020-02-06 DIAGNOSIS — M5413 Radiculopathy, cervicothoracic region: Secondary | ICD-10-CM | POA: Diagnosis not present

## 2020-02-08 ENCOUNTER — Other Ambulatory Visit: Payer: Self-pay

## 2020-02-08 ENCOUNTER — Ambulatory Visit
Admission: EM | Admit: 2020-02-08 | Discharge: 2020-02-08 | Disposition: A | Discharge status: Home / Self Care | Payer: Medicare Other | Attending: Emergency Medicine | Admitting: Emergency Medicine

## 2020-02-08 DIAGNOSIS — Z20822 Contact with and (suspected) exposure to covid-19: Secondary | ICD-10-CM | POA: Insufficient documentation

## 2020-02-08 DIAGNOSIS — Z0189 Encounter for other specified special examinations: Secondary | ICD-10-CM | POA: Diagnosis not present

## 2020-02-08 LAB — POCT RAPID STREP A (OFFICE): Rapid Strep A Screen: NEGATIVE

## 2020-02-08 NOTE — Discharge Instructions (Signed)
Rapid strep negative Culture sent.  Someone will follow up with you regarding abnormal results.   COVID testing ordered.  It will take between 2-5 days for test results.  Someone will contact you regarding abnormal results.    In the meantime:  If you were to develop symptoms: You should remain isolated in your home for 10 days from symptom onset AND greater than 72 hours after symptoms resolution (absence of fever without the use of fever-reducing medication and improvement in respiratory symptoms), whichever is longer If you have had exposure: you should remain in quarantine for 7 days from exposure.  However, if symptoms develop you must self isolate and return for retesting Get plenty of rest and push fluids Follow up with PCP as needed Call or go to the ED if you have any new symptoms such as fever, cough, shortness of breath, chest tightness, chest pain, turning blue, changes in mental status, etc..Marland Kitchen

## 2020-02-08 NOTE — ED Triage Notes (Signed)
Pt presents with complaints of exposure to covid this past week. Reports headache that can be normal for her. Pt states she has strep throat frequently and has had a scratchy throat.

## 2020-02-08 NOTE — ED Provider Notes (Signed)
[place]   [id number] 02/08/20 Arrival Time: 1519   CC: COVID exposure  SUBJECTIVE: History from: patient.  Teresa Tran is a 39 y.o. female who presents for COVID testing.  Admits to positive COVID exposure to friend yesterday.  Denies recent travel.  Denies aggravating or alleviating symptoms.  Denies previous COVID infection.   Complains of possible headache, may be secondary to hunger.  Denies fever, chills, fatigue, nasal congestion, rhinorrhea, sore throat, cough, SOB, wheezing, chest pain, nausea, vomiting, changes in bowel or bladder habits.    Also requests to be tested for strep.  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  Denies sore throat or fever at this time.    ROS: As per HPI.  All other pertinent ROS negative.     Past Medical History:  Diagnosis Date  . Allergy    medicine  . Anemia   . Anxiety   . Bipolar disorder ([place])   . BV (bacterial vaginosis) 08/15/2014  . CSF leak   . Depression   . GERD (gastroesophageal reflux disease)   . Headache(784.0)   . Hypertension   . Low back pain 08/15/2014  . Lupus ([place])   . Obesity   . Ulcer   . Urinary frequency 08/30/2014  . Vaginal discharge 08/15/2014  . Vaginal irritation 02/12/2014  . Yeast infection 02/12/2014   Past Surgical History:  Procedure Laterality Date  . CHOLECYSTECTOMY  Feb 2015   Dr. Ladona Horns, [place]  . ENDOSCOPIC CONCHA BULLOSA RESECTION Left 07/20/2016   Procedure: ENDOSCOPIC LEFT  CONCHA BULLOSA RESECTION;  Surgeon: Leta Baptist, MD;  Location: [facility];  Service: ENT;  Laterality: Left;  . 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 ESOPHAGOGASTRODUODENOSCOPY N/A 05/06/2015   Procedure: ESOPHAGOGASTRODUODENOSCOPY (EGD);  Surgeon: Danie Binder, MD;  [facility];  Service: Endoscopy;  Laterality: N/A;  1300 - moved to 1:15 - office to notify  . ESOPHAGOGASTRODUODENOSCOPY (EGD) WITH  ESOPHAGEAL DILATION N/A 02/11/2014   Dr. 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 EXCISION CHONCHA BULLOSA N/A 07/20/2016   Procedure: NASAL SEPTOPLASTY WITH BILATERAL TURBINATE REDUCTION;  Surgeon: Leta Baptist, MD;  Location: [facility];  Service: ENT;  Laterality: N/A;  . RHINOPLASTY    . SAVORY DILATION N/A 05/06/2015   Procedure: SAVORY DILATION;  Surgeon: Danie Binder, MD;  [facility];  Service: Endoscopy;  Laterality: N/A;  . TUBAL LIGATION     Allergies  Allergen Reactions  . Macrobid [Nitrofurantoin Monohyd Macro] Itching  . Risperdal [Risperidone] Other (See Comments)    Wt gain   No current facility-administered medications on file prior to encounter.   Current Outpatient Medications on File Prior to Encounter  Medication Sig Dispense Refill  . acetaminophen (TYLENOL) 500 MG tablet Take 1,000 mg by mouth every 6 (six) hours as needed for mild pain or headache.    . albuterol (PROVENTIL HFA;VENTOLIN HFA) 108 (90 Base) MCG/ACT inhaler Inhale 2 puffs into the lungs every 4 (four) hours as needed for wheezing or shortness of breath. 1 Inhaler 2  . chlorthalidone (HYGROTON) 25 MG tablet Take 1 tablet (25 mg total) by mouth daily. 90 tablet 3  . dexlansoprazole (DEXILANT) 60 MG capsule Take 1 capsule (60 mg total) by mouth daily before breakfast. 30 capsule 11  . methylphenidate (CONCERTA) 27 MG PO CR tablet Take 1 tablet (27 mg total) by mouth every morning. [place]  tablet 0  . Multiple Vitamins-Minerals (IMMUNE SUPPORT PO) Take by mouth.    . nystatin (MYCOSTATIN/NYSTOP) powder Apply 1 application topically 3 (three) times daily. (Patient taking differently: Apply 1 application topically as needed. ) 30 g 3   Social History   Socioeconomic History  . Marital status: Married    Spouse name: Nathaneil Canary  . Number of children: 2  . Years of education: 16  . Highest education level: Bachelor's degree (e.g., BA, AB, BS)    Occupational History  . Occupation: homehealth  Tobacco Use  . Smoking status: Never Smoker  . Smokeless tobacco: Never Used  Substance and Sexual Activity  . Alcohol use: No    Alcohol/week: 0.0 standard drinks  . Drug use: No  . Sexual activity: Yes    Birth control/protection: Surgical    Comment: tubal  Other Topics Concern  . Not on file  Social History Narrative   Working on Oceanographer in [employer]   Lives with husband and 2 children   One Neurosurgeon   Active in church   Social Determinants of Health   Financial Resource Strain: [place]   . Difficulty of Paying Living Expenses: Not hard at all  Food Insecurity: No Food Insecurity  . Worried About Charity fundraiser in the Last Year: Never true  . Ran Out of Food in the Last Year: Never true  Transportation Needs: No Transportation Needs  . Lack of Transportation (Medical): No  . Lack of Transportation (Non-Medical): No  Physical Activity: Insufficiently Active  . Days of Exercise per Week: 2 days  . Minutes of Exercise per Session: 60 min  Stress: No Stress Concern Present  . Feeling of Stress : Not at all  Social Connections: Not Isolated  . Frequency of Communication with Friends and Family: More than three times a week  . Frequency of Social Gatherings with Friends and Family: More than three times a week  . Attends Religious Services: More than 4 times per year  . Active Member of Clubs or Organizations: Yes  . Attends Archivist Meetings: More than 4 times per year  . Marital Status: Married  Human resources officer Violence: Not At Risk  . Fear of Current or Ex-Partner: No  . Emotionally Abused: No  . Physically Abused: No  . Sexually Abused: No   Family History  Problem Relation Age of Onset  . Depression Father   . Anxiety disorder Father   . Diabetes Father   . Hypertension Father   . Hyperlipidemia Father   . Kidney disease Father   . Mental illness Father   . Pneumonia Father   .  Stroke Father   . Depression Sister   . Anxiety disorder Sister   . Cancer Sister        cervical  . Stroke Sister   . Depression Brother   . Anxiety disorder Brother   . Cancer - Other Paternal Grandfather           . Cancer Paternal Grandfather        prostate  . Kidney disease Mother   . Diabetes Mother   . Cancer Mother        kidney  . Hyperlipidemia Mother   . Hypertension Mother   . Stroke Mother   . Aneurysm Maternal Grandfather   . Cancer Maternal Grandmother 75       breast  . Cancer Paternal Grandmother        bladder  .  ADD / ADHD Neg Hx   . Alcohol abuse Neg Hx   . Drug abuse Neg Hx   . Bipolar disorder Neg Hx   . Dementia Neg Hx   . OCD Neg Hx   . Paranoid behavior Neg Hx   . Schizophrenia Neg Hx   . Seizures Neg Hx   . Sexual abuse Neg Hx   . Physical abuse Neg Hx   . Colon cancer Neg Hx     OBJECTIVE:  Vitals:   02/08/20 1526  BP: 131/83  Pulse: 100  Resp: 17  Temp: 98.5 F (36.9 C)  TempSrc: Oral  SpO2: 98%     General appearance: alert; well-appearing, nontoxic; speaking in full sentences and tolerating own secretions HEENT: NCAT; Ears: EACs clear, TMs pearly gray; Eyes: PERRL.  EOM grossly intact.Nose: nares patent without rhinorrhea, Throat: oropharynx clear, tonsils non erythematous or enlarged, uvula midline  Neck: supple without LAD Lungs: unlabored respirations, symmetrical air entry; cough: absent; no respiratory distress; CTAB Heart: regular rate and rhythm.  Skin: warm and dry Psychological: alert and cooperative; normal mood and affect  ASSESSMENT & PLAN:  1. Exposure to COVID-19 virus     Rapid strep negative Culture sent.  Someone will follow up with you regarding abnormal results.   COVID testing ordered.  It will take between 2-5 days for test results.  Someone will contact you regarding abnormal results.    In the meantime:  If you were to develop symptoms: You should remain isolated in your home for 10 days from  symptom onset AND greater than 72 hours after symptoms resolution (absence of fever without the use of fever-reducing medication and improvement in respiratory symptoms), whichever is longer If you have had exposure: you should remain in quarantine for 7 days from exposure.  However, if symptoms develop you must self isolate and return for retesting Get plenty of rest and push fluids Follow up with PCP as needed Call or go to the ED if you have any new symptoms such as fever, cough, shortness of breath, chest tightness, chest pain, turning blue, changes in mental status, etc...   Reviewed expectations re: course of current medical issues. Questions answered. Outlined signs and symptoms indicating need for more acute intervention. Patient verbalized understanding. After Visit Summary given.         Lestine Box, PA-C 02/08/20 1624

## 2020-02-10 LAB — SARS-COV-2, NAA 2 DAY TAT

## 2020-02-10 LAB — NOVEL CORONAVIRUS, NAA: SARS-CoV-2, NAA: NOT DETECTED

## 2020-02-11 LAB — CULTURE, GROUP A STREP (THRC)

## 2020-02-15 ENCOUNTER — Ambulatory Visit
Admission: EM | Admit: 2020-02-15 | Discharge: 2020-02-15 | Disposition: A | Discharge status: Home / Self Care | Payer: Medicare Other

## 2020-02-15 ENCOUNTER — Other Ambulatory Visit: Payer: Self-pay

## 2020-02-20 DIAGNOSIS — M5413 Radiculopathy, cervicothoracic region: Secondary | ICD-10-CM | POA: Diagnosis not present

## 2020-02-20 DIAGNOSIS — M9903 Segmental and somatic dysfunction of lumbar region: Secondary | ICD-10-CM | POA: Diagnosis not present

## 2020-02-20 DIAGNOSIS — M9901 Segmental and somatic dysfunction of cervical region: Secondary | ICD-10-CM | POA: Diagnosis not present

## 2020-02-20 DIAGNOSIS — M9902 Segmental and somatic dysfunction of thoracic region: Secondary | ICD-10-CM | POA: Diagnosis not present

## 2020-02-20 DIAGNOSIS — M545 Low back pain: Secondary | ICD-10-CM | POA: Diagnosis not present

## 2020-02-20 DIAGNOSIS — M542 Cervicalgia: Secondary | ICD-10-CM | POA: Diagnosis not present

## 2020-02-20 DIAGNOSIS — M791 Myalgia, unspecified site: Secondary | ICD-10-CM | POA: Diagnosis not present

## 2020-02-27 DIAGNOSIS — M545 Low back pain: Secondary | ICD-10-CM | POA: Diagnosis not present

## 2020-02-27 DIAGNOSIS — M542 Cervicalgia: Secondary | ICD-10-CM | POA: Diagnosis not present

## 2020-02-27 DIAGNOSIS — M9903 Segmental and somatic dysfunction of lumbar region: Secondary | ICD-10-CM | POA: Diagnosis not present

## 2020-02-27 DIAGNOSIS — M791 Myalgia, unspecified site: Secondary | ICD-10-CM | POA: Diagnosis not present

## 2020-02-27 DIAGNOSIS — M5413 Radiculopathy, cervicothoracic region: Secondary | ICD-10-CM | POA: Diagnosis not present

## 2020-02-27 DIAGNOSIS — M9902 Segmental and somatic dysfunction of thoracic region: Secondary | ICD-10-CM | POA: Diagnosis not present

## 2020-02-27 DIAGNOSIS — M9901 Segmental and somatic dysfunction of cervical region: Secondary | ICD-10-CM | POA: Diagnosis not present

## 2020-03-04 ENCOUNTER — Telehealth: Payer: Self-pay | Admitting: *Deleted

## 2020-03-04 ENCOUNTER — Telehealth: Payer: Self-pay | Admitting: Emergency Medicine

## 2020-03-04 NOTE — Telephone Encounter (Signed)
Pt wants LL to call her when she gets a chance.  It's in regards to [place].

## 2020-03-04 NOTE — Telephone Encounter (Signed)
Pt called and stated she would like some samples. She stated the dexilant is working very well for her. However, medicare has increased the price of the rx and it is about 100$ a month. She stated medicare is helping her with some financial assistance for the medication but It will take a few weeks. Gave pt 3 boxes of 60mg  dexilant to last her until assistance kicks in

## 2020-03-04 NOTE — Telephone Encounter (Signed)
error 

## 2020-03-05 DIAGNOSIS — M9901 Segmental and somatic dysfunction of cervical region: Secondary | ICD-10-CM | POA: Diagnosis not present

## 2020-03-05 DIAGNOSIS — M542 Cervicalgia: Secondary | ICD-10-CM | POA: Diagnosis not present

## 2020-03-05 DIAGNOSIS — M9902 Segmental and somatic dysfunction of thoracic region: Secondary | ICD-10-CM | POA: Diagnosis not present

## 2020-03-05 DIAGNOSIS — M5413 Radiculopathy, cervicothoracic region: Secondary | ICD-10-CM | POA: Diagnosis not present

## 2020-03-05 DIAGNOSIS — M791 Myalgia, unspecified site: Secondary | ICD-10-CM | POA: Diagnosis not present

## 2020-03-05 DIAGNOSIS — M9903 Segmental and somatic dysfunction of lumbar region: Secondary | ICD-10-CM | POA: Diagnosis not present

## 2020-03-05 DIAGNOSIS — M545 Low back pain: Secondary | ICD-10-CM | POA: Diagnosis not present

## 2020-03-10 ENCOUNTER — Ambulatory Visit: Payer: Medicare Other | Admitting: Psychiatry

## 2020-03-11 ENCOUNTER — Encounter: Payer: Self-pay | Admitting: Psychiatry

## 2020-03-11 ENCOUNTER — Other Ambulatory Visit: Payer: Self-pay

## 2020-03-11 ENCOUNTER — Ambulatory Visit (INDEPENDENT_AMBULATORY_CARE_PROVIDER_SITE_OTHER): Payer: Medicare Other | Admitting: Psychiatry

## 2020-03-11 VITALS — Wt 171.0 lb

## 2020-03-11 DIAGNOSIS — F329 Major depressive disorder, single episode, unspecified: Secondary | ICD-10-CM | POA: Diagnosis not present

## 2020-03-11 DIAGNOSIS — F908 Attention-deficit hyperactivity disorder, other type: Secondary | ICD-10-CM | POA: Diagnosis not present

## 2020-03-11 DIAGNOSIS — F419 Anxiety disorder, unspecified: Secondary | ICD-10-CM

## 2020-03-11 DIAGNOSIS — F32A Depression, unspecified: Secondary | ICD-10-CM

## 2020-03-11 MED ORDER — METHYLPHENIDATE HCL ER (OSM) 27 MG PO TBCR: 27.0000 mg | EXTENDED_RELEASE_TABLET | ORAL | 0 refills | Status: DC

## 2020-03-11 NOTE — Progress Notes (Signed)
Teresa Tran [id number] 09/21/1981 39 y.o.  Subjective:   Patient ID:  Teresa Tran is a 39 y.o. (DOB 1981-10-18) female.  Chief Complaint:  Chief Complaint  Patient presents with  . Follow-up    h/o impaired concentration, depression, anxiety    HPI Teresa Tran presents to the office today for follow-up of mood, anxiety, and impaired concentration. She feels that Concerta 27 mg po q am has been helpful for focus. Fatigue has also been less and not needing to nap. She reports that energy has been improved. Getting up around 5:45 am and stays up throughout the day. Goes to bed around 10:30-11 pm. Sleep has been adequate and denies middle of the night awakenings. Mood has been stable. Has intentionally lost 70 lbs since July 2020. Has changed eating habits and exercising more. Has noticed some improvement in her stamina with recent trip to the zoo. Motivation has been good. Denies anhedonia. Denies SI.   Has had headaches the last 2 weeks around the middle of the day. Notices pain in her hips around the time of menses now that menstrua cycle has been more regular. Denies any mood changes around menses.   Works part-time. 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    Past Psychiatric Medication Trials: Rexulti- increased HA's, drowsiness Cymbalta Buspar Cerefolin Topamax Prozac Celexa Lexapro Xanax Ativan Lithium-tremors Lamictal risperdal Seroquel Wellbutrin- Ineffective, HA Phentermine Gabapentin- drowsiness Methylphenidate-Effective for concentration in the morning Concerta- Had increased jitteriness and HR on 36 mg.  Propranolol- caused dizziness    GAD-7     Office Visit from 12/17/2019 in [facility]  Total GAD-7 Score  0    PHQ2-9     Office Visit from 12/25/2019 in [facility] from 12/21/2019 in [facility] Visit from 12/17/2019 in  [facility] Visit from 09/14/2019 in [facility] Office Visit from 06/06/2019 in [facility]  PHQ-2 Total Score  0  0  0  0  0  PHQ-9 Total Score  --  0  0  --  --       Review of Systems:  Review of Systems  Cardiovascular: Negative for palpitations.  Musculoskeletal: Positive for arthralgias. Negative for gait problem.  Neurological: Positive for headaches. Negative for tremors.  Psychiatric/Behavioral:       Please refer to HPI    Medications: I have reviewed the patient's current medications.  Current Outpatient Medications  Medication Sig Dispense Refill  . acetaminophen (TYLENOL) 500 MG tablet Take 1,000 mg by mouth every 6 (six) hours as needed for mild pain or headache.    . chlorthalidone (HYGROTON) 25 MG tablet Take 1 tablet (25 mg total) by mouth daily. 90 tablet 3  . dexlansoprazole (DEXILANT) 60 MG capsule Take 1 capsule (60 mg total) by mouth daily before breakfast. 30 capsule 11  . methylphenidate (CONCERTA) 27 MG PO CR tablet Take 1 tablet (27 mg total) by mouth every morning. 30 tablet 0  . Multiple Vitamins-Minerals (IMMUNE SUPPORT PO) Take by mouth.    . nystatin (MYCOSTATIN/NYSTOP) powder Apply 1 application topically 3 (three) times daily. (Patient taking differently: Apply 1 application topically as needed. ) 30 g 3  . albuterol (PROVENTIL HFA;VENTOLIN HFA) 108 (90 Base) MCG/ACT inhaler Inhale 2 puffs into the lungs every 4 (four) hours as needed for wheezing or shortness of breath. 1 Inhaler 2  . [  START ON 04/08/2020] methylphenidate (CONCERTA) 27 MG PO CR tablet Take 1 tablet (27 mg total) by mouth every morning. 30 tablet 0  . [START ON 05/06/2020] methylphenidate (CONCERTA) 27 MG PO CR tablet Take 1 tablet (27 mg total) by mouth every morning. 30 tablet 0   No current facility-administered medications for this visit.    Medication Side Effects: None  Allergies:  Allergies  Allergen Reactions  .  Macrobid [Nitrofurantoin Monohyd Macro] Itching  . Risperdal [Risperidone] Other (See Comments)    Wt gain    Past Medical History:  Diagnosis Date  . Allergy    medicine  . Anemia   . Anxiety   . Bipolar disorder ([place])   . BV (bacterial vaginosis) 08/15/2014  . CSF leak   . Depression   . GERD (gastroesophageal reflux disease)   . Headache(784.0)   . Hypertension   . Low back pain 08/15/2014  . Lupus ([place])   . Obesity   . Ulcer   . Urinary frequency 08/30/2014  . Vaginal discharge 08/15/2014  . Vaginal irritation 02/12/2014  . Yeast infection 02/12/2014    Family History  Problem Relation Age of Onset  . Depression Father   . Anxiety disorder Father   . Diabetes Father   . Hypertension Father   . Hyperlipidemia Father   . Kidney disease Father   . Mental illness Father   . Pneumonia Father   . Stroke Father   . Depression Sister   . Anxiety disorder Sister   . Cancer Sister        cervical  . Stroke Sister   . Depression Brother   . Anxiety disorder Brother   . Cancer - Other Paternal Grandfather           . Cancer Paternal Grandfather        prostate  . Kidney disease Mother   . Diabetes Mother   . Cancer Mother        kidney  . Hyperlipidemia Mother   . Hypertension Mother   . Stroke Mother   . Aneurysm Maternal Grandfather   . Cancer Maternal Grandmother 40       breast  . Cancer Paternal Grandmother        bladder  . ADD / ADHD Neg Hx   . Alcohol abuse Neg Hx   . Drug abuse Neg Hx   . Bipolar disorder Neg Hx   . Dementia Neg Hx   . OCD Neg Hx   . Paranoid behavior Neg Hx   . Schizophrenia Neg Hx   . Seizures Neg Hx   . Sexual abuse Neg Hx   . Physical abuse Neg Hx   . Colon cancer Neg Hx     Social History   Socioeconomic History  . Marital status: Married    Spouse name: Nathaneil Canary  . Number of children: 2  . Years of education: 16  . Highest education level: Bachelor's degree (e.g., BA, AB, BS)  Occupational History  . Occupation:  homehealth  Tobacco Use  . Smoking status: Never Smoker  . Smokeless tobacco: Never Used  Substance and Sexual Activity  . Alcohol use: No    Alcohol/week: 0.0 standard drinks  . Drug use: No  . Sexual activity: Yes    Birth control/protection: Surgical    Comment: tubal  Other Topics Concern  . 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 Difficulty of Paying Living Expenses: Not hard at all  Food Insecurity: No Food Insecurity  . Worried About Charity fundraiser in the Last Year: Never true  . Ran Out of Food in the Last Year: Never true  Transportation Needs: No Transportation Needs  . Lack of Transportation (Medical): No  . Lack of Transportation (Non-Medical): No  Physical Activity: Insufficiently Active  . Days of Exercise per Week: 2 days  . Minutes of Exercise per Session: 60 min  Stress: No Stress Concern Present  . Feeling of Stress : Not at all  Social Connections: Not Isolated  . Frequency of Communication with Friends and Family: More than three times a week  . Frequency of Social Gatherings with Friends and Family: More than three times a week  . Attends Religious Services: More than 4 times per year  . Active Member of Clubs or Organizations: Yes  . Attends Archivist Meetings: More than 4 times per year  . Marital Status: Married  Human resources officer Violence: Not At Risk  . Fear of Current or Ex-Partner: No  . Emotionally Abused: No  . Physically Abused: No  . Sexually Abused: No    Past Medical History, Surgical history, Social history, and Family history were reviewed and updated as appropriate.   Please see review of systems for further details on the patient's review from today.   Objective:   Physical Exam:  Wt 171 lb (77.6 kg)   BMI 33.40 kg/m   Physical  Exam Constitutional:      General: She is not in acute distress. Musculoskeletal:        General: No deformity.  Neurological:     Mental Status: She is alert and oriented to person, place, and time.     Coordination: Coordination normal.  Psychiatric:        Attention and Perception: Attention and perception normal. She does not perceive auditory or visual hallucinations.        Mood and Affect: Mood normal. Mood is not anxious or depressed. Affect is not labile, blunt, angry or inappropriate.        Speech: Speech normal.        Behavior: Behavior normal.        Thought Content: Thought content normal. Thought content is not paranoid or delusional. Thought content does not include homicidal or suicidal ideation. Thought content does not include homicidal or suicidal plan.        Cognition and Memory: Cognition and memory normal.        Judgment: Judgment normal.     Comments: Insight intact     Lab Review:     Component Value Date/Time   NA 142 12/17/2019 1039   K 3.3 (L) 12/17/2019 1039   CL 100 12/17/2019 1039   CO2 19 (L) 12/17/2019 1039   GLUCOSE 89 12/17/2019 1039   GLUCOSE 102 (H) 08/22/2018 1235   BUN 10 12/17/2019 1039   CREATININE 0.79 12/17/2019 1039   CREATININE 0.64 09/01/2017 1635   CALCIUM 9.9 12/17/2019 1039   PROT 7.3 12/17/2019 1039   ALBUMIN 4.5 12/17/2019 1039   AST 28 12/17/2019 1039   ALT 30 12/17/2019 1039   ALKPHOS 79 12/17/2019 1039   BILITOT 0.5 12/17/2019 1039   GFRNONAA 96 12/17/2019 1039   GFRNONAA 116 09/01/2017 1635  GFRAA 111 12/17/2019 1039   GFRAA 134 09/01/2017 1635       Component Value Date/Time   WBC 6.7 12/17/2019 1039   WBC 7.7 08/22/2018 1235   RBC 5.19 12/17/2019 1039   RBC 4.61 08/22/2018 1235   HGB 13.6 12/17/2019 1039   HCT 40.7 12/17/2019 1039   PLT 210 12/17/2019 1039   MCV 78 (L) 12/17/2019 1039   MCH 26.2 (L) 12/17/2019 1039   MCH 25.8 (L) 08/22/2018 1235   MCHC 33.4 12/17/2019 1039   MCHC 30.7 08/22/2018  1235   RDW 14.7 12/17/2019 1039   LYMPHSABS 1.3 12/17/2019 1039   MONOABS 0.5 10/09/2016 1416   EOSABS 0.0 12/17/2019 1039   BASOSABS 0.1 12/17/2019 1039    Lithium Lvl  Date Value Ref Range Status  08/22/2014 0.80 0.80 - 1.40 mEq/L Final     No results found for: PHENYTOIN, PHENOBARB, VALPROATE, CBMZ   .res Assessment: Plan:   Continue Concerta 27 mg daily to improve concentration and focus. Mood and anxiety signs and symptoms remain stable and therefore will not initiate any treatment for mood or anxiety at this time. Patient to follow-up in 3 months or sooner if clinically indicated. Patient advised to contact office with any questions, adverse effects, or acute worsening in signs and symptoms.  Teresa Tran was seen today for follow-up.  Diagnoses and all orders for this visit:  Attention deficit hyperactivity disorder (ADHD), other type -     methylphenidate (CONCERTA) 27 MG PO CR tablet; Take 1 tablet (27 mg total) by mouth every morning. -     methylphenidate (CONCERTA) 27 MG PO CR tablet; Take 1 tablet (27 mg total) by mouth every morning. -     methylphenidate (CONCERTA) 27 MG PO CR tablet; Take 1 tablet (27 mg total) by mouth every morning.  Anxiety disorder, unspecified type  Depression, unspecified depression type     Please see After Visit Summary for patient specific instructions.  Future Appointments  Date Time Provider [place]  06/10/2020  8:30 AM Thayer Headings, PMHNP [facility] None  06/16/2020  9:10 AM [place], Fransisca Kaufmann, MD [facility] None  12/24/2020 10:30 AM [facility]-ANNUAL WELLNESS VISIT [facility] None    No orders of the defined types were placed in this encounter.   -------------------------------

## 2020-03-12 DIAGNOSIS — M545 Low back pain: Secondary | ICD-10-CM | POA: Diagnosis not present

## 2020-03-12 DIAGNOSIS — M5413 Radiculopathy, cervicothoracic region: Secondary | ICD-10-CM | POA: Diagnosis not present

## 2020-03-12 DIAGNOSIS — M542 Cervicalgia: Secondary | ICD-10-CM | POA: Diagnosis not present

## 2020-03-12 DIAGNOSIS — M791 Myalgia, unspecified site: Secondary | ICD-10-CM | POA: Diagnosis not present

## 2020-03-12 DIAGNOSIS — M9901 Segmental and somatic dysfunction of cervical region: Secondary | ICD-10-CM | POA: Diagnosis not present

## 2020-03-12 DIAGNOSIS — M9903 Segmental and somatic dysfunction of lumbar region: Secondary | ICD-10-CM | POA: Diagnosis not present

## 2020-03-12 DIAGNOSIS — M9902 Segmental and somatic dysfunction of thoracic region: Secondary | ICD-10-CM | POA: Diagnosis not present

## 2020-03-19 DIAGNOSIS — M9902 Segmental and somatic dysfunction of thoracic region: Secondary | ICD-10-CM | POA: Diagnosis not present

## 2020-03-19 DIAGNOSIS — M791 Myalgia, unspecified site: Secondary | ICD-10-CM | POA: Diagnosis not present

## 2020-03-19 DIAGNOSIS — M542 Cervicalgia: Secondary | ICD-10-CM | POA: Diagnosis not present

## 2020-03-19 DIAGNOSIS — M5413 Radiculopathy, cervicothoracic region: Secondary | ICD-10-CM | POA: Diagnosis not present

## 2020-03-19 DIAGNOSIS — M9901 Segmental and somatic dysfunction of cervical region: Secondary | ICD-10-CM | POA: Diagnosis not present

## 2020-03-19 DIAGNOSIS — M9903 Segmental and somatic dysfunction of lumbar region: Secondary | ICD-10-CM | POA: Diagnosis not present

## 2020-03-19 DIAGNOSIS — M545 Low back pain: Secondary | ICD-10-CM | POA: Diagnosis not present

## 2020-03-25 ENCOUNTER — Ambulatory Visit
Admission: EM | Admit: 2020-03-25 | Discharge: 2020-03-25 | Disposition: A | Discharge status: Home / Self Care | Payer: Medicare Other | Attending: Emergency Medicine | Admitting: Emergency Medicine

## 2020-03-25 ENCOUNTER — Other Ambulatory Visit: Payer: Self-pay

## 2020-03-25 DIAGNOSIS — R21 Rash and other nonspecific skin eruption: Secondary | ICD-10-CM | POA: Diagnosis not present

## 2020-03-25 DIAGNOSIS — W57XXXA Bitten or stung by nonvenomous insect and other nonvenomous arthropods, initial encounter: Secondary | ICD-10-CM

## 2020-03-25 MED ORDER — DOXYCYCLINE HYCLATE 100 MG PO CAPS
200.0000 mg | ORAL_CAPSULE | Freq: Once | ORAL | 0 refills | Status: AC
Start: 2020-03-25 — End: 2020-03-25

## 2020-03-25 NOTE — Discharge Instructions (Signed)
Prescribed single prophylactic dose of doxycycline 200 mg To prevent tick bites, wear long sleeves, long pants, and light colors. Use insect DEET repellent. Follow the instructions on the bottle. If the tick is biting, do not try to remove it with heat, alcohol, petroleum jelly, or fingernail polish. Use tweezers, curved forceps, or a tick-removal tool to grasp the tick. Gently pull up until the tick lets go. Do not twist or jerk the tick. Do not squeeze or crush the tick. Return here or go to ER if you have any new or worsening symptoms (rash, nausea, vomiting, fever, chills, headache, fatigue)

## 2020-03-25 NOTE — ED Triage Notes (Signed)
Pt presents with tick bite to right knee. Removed tick on Sunday. 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

## 2020-03-25 NOTE — ED Provider Notes (Addendum)
[facility]    CSN: [id number] Arrival date & time: 03/25/20  1407      History   Chief Complaint No chief complaint on file.   HPI Teresa Tran is a 39 y.o. female.   Who presented to the urgent care with a complaint of tick bite to the right knee that occur 2 to 3 days ago.  Denies a precipitating event, noticed while changing clothes.  Localizes the bite to the right knee.  Has  removed the tick.  Report previous hx of tick bite and was diagnosed with [facility].  Denies fever, chills, nausea, vomiting, headache, dizziness, weakness, fatigue, rash, or abdominal pain.   The history is provided by the patient. No language interpreter was used.    Past Medical History:  Diagnosis Date  . Allergy    medicine  . Anemia   . Anxiety   . Bipolar disorder ([place])   . BV (bacterial vaginosis) 08/15/2014  . CSF leak   . Depression   . GERD (gastroesophageal reflux disease)   . Headache(784.0)   . Hypertension   . Low back pain 08/15/2014  . Lupus ([place])   . Obesity   . Ulcer   . Urinary frequency 08/30/2014  . Vaginal discharge 08/15/2014  . Vaginal irritation 02/12/2014  . Yeast infection 02/12/2014    Patient Active Problem List   Diagnosis Date Noted  . Fatty liver 01/22/2020  . Autoimmune disorder ([place]) 01/22/2020  . Encounter for well woman exam with routine gynecological exam 12/25/2019  . Mass of upper inner quadrant of left breast 12/25/2019  . Infection of skin and subcutaneous tissue due to fungus 12/25/2019  . Yeast vaginitis 09/14/2019  . Vaginal irritation 09/14/2019  . 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 Loose stools 12/07/2018  . Overflow incontinence of urine 07/24/2018  . Labial skin tag 07/24/2018  . Absence of bladder continence 07/24/2018  . Prediabetes 06/12/2018  . Liver lesion 09/07/2017  . Proteinuria 09/07/2017  . Essential hypertension 09/01/2017  . Migraine 06/21/2017  . Chronic  fatigue 06/21/2017  . Obesity (BMI 30.0-34.9) 06/21/2017  . GERD (gastroesophageal reflux disease) 12/22/2016  . Uterine leiomyoma 12/02/2016  . Menorrhagia with irregular cycle 12/02/2016  . Normocytic anemia 09/11/2014  . PUD (peptic ulcer disease) 06/06/2014  . Vitamin D deficiency 02/19/2013  . PTSD (post-traumatic stress disorder) 03/23/2012    Past Surgical History:  Procedure Laterality Date  . CHOLECYSTECTOMY  Feb 2015   Dr. Ladona Horns, [place]  . ENDOSCOPIC CONCHA BULLOSA RESECTION Left 07/20/2016   Procedure: ENDOSCOPIC LEFT  CONCHA BULLOSA RESECTION;  Surgeon: Leta Baptist, MD;  Location: [place];  Service: ENT;  Laterality: Left;  . ESOPHAGOGASTRODUODENOSCOPY N/A 06/11/2014   :[id number] DUODENITIS/MODERATE EROSIVE GASTRICTIS IN ANTRUM/PARTIALLY HEALED ULCERS  . ESOPHAGOGASTRODUODENOSCOPY N/A 05/06/2015   Procedure: ESOPHAGOGASTRODUODENOSCOPY (EGD);  Surgeon: Danie Binder, MD;  [facility];  Service: Endoscopy;  Laterality: N/A;  1300 - moved to 1:15 - office to notify  . ESOPHAGOGASTRODUODENOSCOPY (EGD) WITH ESOPHAGEAL DILATION N/A 02/11/2014   Dr. 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 EXCISION CHONCHA BULLOSA N/A 07/20/2016   Procedure: NASAL SEPTOPLASTY WITH BILATERAL TURBINATE REDUCTION;  Surgeon: Leta Baptist, MD;  Location: [place];  Service: ENT;  Laterality: N/A;  . RHINOPLASTY    . SAVORY DILATION N/A 05/06/2015   Procedure: SAVORY DILATION;  Surgeon: Danie Binder, MD;  [facility];  Service: Endoscopy;  Laterality: N/A;  . TUBAL LIGATION      OB History    Gravida  2   Para  2   Term  1   Preterm  1   AB      Living  2     SAB      TAB      Ectopic      Multiple      Live Births  2            Home Medications    Prior to Admission medications   Medication Sig Start Date End Date Taking? Authorizing Provider  acetaminophen (TYLENOL) 500 MG tablet  Take 1,000 mg by mouth every 6 (six) hours as needed for mild pain or headache.    [provider]  albuterol (PROVENTIL HFA;VENTOLIN HFA) 108 (90 Base) MCG/ACT inhaler Inhale 2 puffs into the lungs every 4 (four) hours as needed for wheezing or shortness of breath. 02/01/19   Dettinger, Fransisca Kaufmann, MD  chlorthalidone (HYGROTON) 25 MG tablet Take 1 tablet (25 mg total) by mouth daily. 12/17/19   Dettinger, Fransisca Kaufmann, MD  dexlansoprazole (DEXILANT) 60 MG capsule Take 1 capsule (60 mg total) by mouth daily before breakfast. 01/22/20   Mahala Menghini, PA-C  doxycycline (VIBRAMYCIN) 100 MG capsule Take 2 capsules (200 mg total) by mouth once for 1 dose. 03/25/20 03/25/20  Solaris Kram, Darrelyn Hillock, FNP  methylphenidate (CONCERTA) 27 MG PO CR tablet Take 1 tablet (27 mg total) by mouth every morning. 03/11/20   Thayer Headings, PMHNP  methylphenidate (CONCERTA) 27 MG PO CR tablet Take 1 tablet (27 mg total) by mouth every morning. 04/08/20   Thayer Headings, PMHNP  methylphenidate (CONCERTA) 27 MG PO CR tablet Take 1 tablet (27 mg total) by mouth every morning. 05/06/20   Thayer Headings, PMHNP  Multiple Vitamins-Minerals (IMMUNE SUPPORT PO) Take by mouth.    [provider]  nystatin (MYCOSTATIN/NYSTOP) powder Apply 1 application topically 3 (three) times daily. Patient taking differently: Apply 1 application topically as needed.  12/25/19   Estill Dooms, NP    Family History Family History  Problem Relation Age of Onset  . Depression Father   . Anxiety disorder Father   . Diabetes Father   . Hypertension Father   . Hyperlipidemia Father   . Kidney disease Father   . Mental illness Father   . Pneumonia Father   . Stroke Father   . Depression Sister   . Anxiety disorder Sister   . Cancer Sister        cervical  . Stroke Sister   . Depression Brother   . Anxiety disorder Brother   . Cancer - Other Paternal Grandfather           . Cancer Paternal Grandfather        prostate  . Kidney  disease Mother   . Diabetes Mother   . Cancer Mother        kidney  . Hyperlipidemia Mother   . Hypertension Mother   . Stroke Mother   . Aneurysm Maternal Grandfather   . Cancer Maternal Grandmother 33       breast  . Cancer Paternal Grandmother        bladder  . ADD / ADHD Neg Hx   . Alcohol abuse Neg Hx   . Drug abuse Neg Hx   . Bipolar disorder Neg Hx   . Dementia Neg  Hx   . OCD Neg Hx   . Paranoid behavior Neg Hx   . Schizophrenia Neg Hx   . Seizures Neg Hx   . Sexual abuse Neg Hx   . Physical abuse Neg Hx   . Colon cancer Neg Hx     Social History Social History   Tobacco Use  . Smoking status: Never Smoker  . Smokeless tobacco: Never Used  Substance Use Topics  . Alcohol use: No    Alcohol/week: 0.0 standard drinks  . Drug use: No     Allergies   Macrobid [nitrofurantoin monohyd macro]   Review of Systems Review of Systems  Constitutional: Negative.   Respiratory: Negative.   Cardiovascular: Negative.   Skin: Positive for color change.  All other systems reviewed and are negative.    Physical Exam Triage Vital Signs ED Triage Vitals  Enc Vitals Group     BP 03/25/20 1418 120/80     Pulse Rate 03/25/20 1418 94     Resp 03/25/20 1418 18     Temp 03/25/20 1418 98.7 F (37.1 C)     Temp src --      SpO2 03/25/20 1418 97 %     Weight --      Height --      Head Circumference --      Peak Flow --      Pain Score 03/25/20 1417 0     Pain Loc --      Pain Edu? --      Excl. in [place]? --    No data found.  Updated Vital Signs BP 120/80   Pulse 94   Temp 98.7 F (37.1 C)   Resp 18   LMP 02/26/2020   SpO2 97%   Visual Acuity Right Eye Distance:   Left Eye Distance:   Bilateral Distance:    Right Eye Near:   Left Eye Near:    Bilateral Near:     Physical Exam Vitals and nursing note reviewed.  Constitutional:      General: She is not in acute distress.    Appearance: Normal appearance. She is normal weight. She is not  ill-appearing, toxic-appearing or diaphoretic.  Cardiovascular:     Rate and Rhythm: Normal rate and regular rhythm.     Pulses: Normal pulses.     Heart sounds: Normal heart sounds. No murmur. No friction rub. No gallop.   Pulmonary:     Effort: Pulmonary effort is normal. No respiratory distress.     Breath sounds: Normal breath sounds. No stridor. No wheezing, rhonchi or rales.  Chest:     Chest wall: No tenderness.  Musculoskeletal:        General: Tenderness present.  Skin:    General: Skin is warm.     Findings: Erythema and rash present.  Neurological:     Mental Status: She is alert.      UC Treatments / Results  Labs (all labs ordered are listed, but only abnormal results are displayed) Labs Reviewed - No data to display  EKG   Radiology No results found.  Procedures Procedures (including critical care time)  Medications Ordered in UC Medications - No data to display  Initial Impression / Assessment and Plan / UC Course  I have reviewed the triage vital signs and the nursing notes.  Pertinent labs & imaging results that were available during my care of the patient were reviewed by me and considered in my medical decision making (  see chart for details).  Patient stable at discharge with no symptom at this time.  Was advised to monitor her symptom and to go to ED.  Single dose doxycycline was prescribed. Final Clinical Impressions(s) / UC Diagnoses   Final diagnoses:  Tick bite, initial encounter     Discharge Instructions     Prescribed single prophylactic dose of doxycycline 200 mg To prevent tick bites, wear long sleeves, long pants, and light colors. Use insect DEET repellent. Follow the instructions on the bottle. If the tick is biting, do not try to remove it with heat, alcohol, petroleum jelly, or fingernail polish. Use tweezers, curved forceps, or a tick-removal tool to grasp the tick. Gently pull up until the tick lets go. Do not twist or jerk  the tick. Do not squeeze or crush the tick. Return here or go to ER if you have any new or worsening symptoms (rash, nausea, vomiting, fever, chills, headache, fatigue)     ED Prescriptions    Medication Sig Dispense Auth. Provider   doxycycline (VIBRAMYCIN) 100 MG capsule Take 2 capsules (200 mg total) by mouth once for 1 dose. 2 capsule Aundra Espin, Darrelyn Hillock, FNP     PDMP not reviewed this encounter.   Emerson Monte, FNP 03/25/20 1529    Emerson Monte, FNP 03/25/20 1530

## 2020-03-26 DIAGNOSIS — M9902 Segmental and somatic dysfunction of thoracic region: Secondary | ICD-10-CM | POA: Diagnosis not present

## 2020-03-26 DIAGNOSIS — M791 Myalgia, unspecified site: Secondary | ICD-10-CM | POA: Diagnosis not present

## 2020-03-26 DIAGNOSIS — M542 Cervicalgia: Secondary | ICD-10-CM | POA: Diagnosis not present

## 2020-03-26 DIAGNOSIS — M545 Low back pain: Secondary | ICD-10-CM | POA: Diagnosis not present

## 2020-03-26 DIAGNOSIS — M9903 Segmental and somatic dysfunction of lumbar region: Secondary | ICD-10-CM | POA: Diagnosis not present

## 2020-03-26 DIAGNOSIS — M5413 Radiculopathy, cervicothoracic region: Secondary | ICD-10-CM | POA: Diagnosis not present

## 2020-03-26 DIAGNOSIS — M9901 Segmental and somatic dysfunction of cervical region: Secondary | ICD-10-CM | POA: Diagnosis not present

## 2020-03-27 ENCOUNTER — Ambulatory Visit (INDEPENDENT_AMBULATORY_CARE_PROVIDER_SITE_OTHER): Payer: Medicare Other | Admitting: Family Medicine

## 2020-03-27 DIAGNOSIS — W57XXXA Bitten or stung by nonvenomous insect and other nonvenomous arthropods, initial encounter: Secondary | ICD-10-CM

## 2020-03-27 DIAGNOSIS — S80261A Insect bite (nonvenomous), right knee, initial encounter: Secondary | ICD-10-CM

## 2020-03-27 NOTE — Progress Notes (Signed)
Virtual Visit via telephone Note  I connected with Teresa Tran on 03/27/20 at [phone] by telephone and verified that I am speaking with the correct person using two identifiers. Teresa Tran is currently located at home and no other people are currently with her during visit. The provider, Fransisca Kaufmann Duvid Smalls, MD is located in their office at time of visit.  Call ended at [phone]  I discussed the limitations, risks, security and privacy concerns of performing an evaluation and management service by telephone and the availability of in person appointments. I also discussed with the patient that there may be a patient responsible charge related to this service. The patient expressed understanding and agreed to proceed.   History and Present Illness: Patient had a tick bite this past Saturday 6 days ago,  It was lodged in her knee.  She went to urgent care and it had inflammation and she got doxycycline 200mg  at one dose. It is on her right knee.  It is still itchy and raised red area right at the bite. The tick was a deer tick or dog tick.  She has not noticed any new or worsening systems except fatigue.  She does not feel she has a fever today.    1. Tick bite, initial encounter     Outpatient Encounter Medications as of 03/27/2020  Medication Sig  . acetaminophen (TYLENOL) 500 MG tablet Take 1,000 mg by mouth every 6 (six) hours as needed for mild pain or headache.  . albuterol (PROVENTIL HFA;VENTOLIN HFA) 108 (90 Base) MCG/ACT inhaler Inhale 2 puffs into the lungs every 4 (four) hours as needed for wheezing or shortness of breath.  . chlorthalidone (HYGROTON) 25 MG tablet Take 1 tablet (25 mg total) by mouth daily.  Marland Kitchen dexlansoprazole (DEXILANT) 60 MG capsule Take 1 capsule (60 mg total) by mouth daily before breakfast.  . methylphenidate (CONCERTA) 27 MG PO CR tablet Take 1 tablet (27 mg total) by mouth every morning.  Derrill Memo ON 04/08/2020] methylphenidate (CONCERTA) 27 MG PO CR tablet Take 1  tablet (27 mg total) by mouth every morning.  Derrill Memo ON 05/06/2020] methylphenidate (CONCERTA) 27 MG PO CR tablet Take 1 tablet (27 mg total) by mouth every morning.  . Multiple Vitamins-Minerals (IMMUNE SUPPORT PO) Take by mouth.  . nystatin (MYCOSTATIN/NYSTOP) powder Apply 1 application topically 3 (three) times daily. (Patient taking differently: Apply 1 application topically as needed. )   No facility-administered encounter medications on file as of 03/27/2020.    Review of Systems  Constitutional: Positive for fatigue. Negative for chills and fever.  Eyes: Negative for visual disturbance.  Respiratory: Negative for chest tightness and shortness of breath.   Cardiovascular: Negative for chest pain and leg swelling.  Skin: Positive for color change (Per patient sounds like she has a small eschar with slight redness around where the tick bite was.). Negative for rash.  Neurological: Negative for dizziness, light-headedness and headaches.  Psychiatric/Behavioral: Negative for agitation, behavioral problems and sleep disturbance.  All other systems reviewed and are negative.   Observations/Objective: Patient sounds comfortable and in no acute distress  Assessment and Plan: Problem List Items Addressed This Visit    None    Visit Diagnoses    Tick bite, initial encounter    -  Primary   Relevant Orders   Lyme Ab/Western Blot Reflex   [place] spotted fvr abs pnl(IgG+IgM)   Alpha-Gal Panel      Patient got single preventative dose for  doxycycline already, will do the lab testing in 2 weeks, she develops any symptoms in the meantime she will call us back and we will do the full course of doxycycline. Follow up plan: Return if symptoms worsen or fail to improve.     I discussed the assessment and treatment plan with the patient. The patient was provided an opportunity to ask questions and all were answered. The patient agreed with the plan and demonstrated an understanding of the  instructions.   The patient was advised to call back or seek an in-person evaluation if the symptoms worsen or if the condition fails to improve as anticipated.  The above assessment and management plan was discussed with the patient. The patient verbalized understanding of and has agreed to the management plan. Patient is aware to call the clinic if symptoms persist or worsen. Patient is aware when to return to the clinic for a follow-up visit. Patient educated on when it is appropriate to go to the emergency department.    I provided 10 minutes of non-face-to-face time during this encounter.    Worthy Rancher, MD

## 2020-04-02 DIAGNOSIS — M9903 Segmental and somatic dysfunction of lumbar region: Secondary | ICD-10-CM | POA: Diagnosis not present

## 2020-04-02 DIAGNOSIS — Z23 Encounter for immunization: Secondary | ICD-10-CM | POA: Diagnosis not present

## 2020-04-02 DIAGNOSIS — M545 Low back pain: Secondary | ICD-10-CM | POA: Diagnosis not present

## 2020-04-02 DIAGNOSIS — M9901 Segmental and somatic dysfunction of cervical region: Secondary | ICD-10-CM | POA: Diagnosis not present

## 2020-04-02 DIAGNOSIS — M5413 Radiculopathy, cervicothoracic region: Secondary | ICD-10-CM | POA: Diagnosis not present

## 2020-04-02 DIAGNOSIS — M542 Cervicalgia: Secondary | ICD-10-CM | POA: Diagnosis not present

## 2020-04-02 DIAGNOSIS — M9902 Segmental and somatic dysfunction of thoracic region: Secondary | ICD-10-CM | POA: Diagnosis not present

## 2020-04-02 DIAGNOSIS — M791 Myalgia, unspecified site: Secondary | ICD-10-CM | POA: Diagnosis not present

## 2020-04-09 ENCOUNTER — Other Ambulatory Visit: Payer: Self-pay

## 2020-04-09 ENCOUNTER — Other Ambulatory Visit: Payer: Medicare Other

## 2020-04-09 DIAGNOSIS — M5413 Radiculopathy, cervicothoracic region: Secondary | ICD-10-CM | POA: Diagnosis not present

## 2020-04-09 DIAGNOSIS — M9901 Segmental and somatic dysfunction of cervical region: Secondary | ICD-10-CM | POA: Diagnosis not present

## 2020-04-09 DIAGNOSIS — M545 Low back pain: Secondary | ICD-10-CM | POA: Diagnosis not present

## 2020-04-09 DIAGNOSIS — M542 Cervicalgia: Secondary | ICD-10-CM | POA: Diagnosis not present

## 2020-04-09 DIAGNOSIS — M791 Myalgia, unspecified site: Secondary | ICD-10-CM | POA: Diagnosis not present

## 2020-04-09 DIAGNOSIS — M9902 Segmental and somatic dysfunction of thoracic region: Secondary | ICD-10-CM | POA: Diagnosis not present

## 2020-04-09 DIAGNOSIS — M9903 Segmental and somatic dysfunction of lumbar region: Secondary | ICD-10-CM | POA: Diagnosis not present

## 2020-04-09 DIAGNOSIS — W57XXXA Bitten or stung by nonvenomous insect and other nonvenomous arthropods, initial encounter: Secondary | ICD-10-CM

## 2020-04-11 ENCOUNTER — Other Ambulatory Visit: Payer: Self-pay | Admitting: Family Medicine

## 2020-04-11 ENCOUNTER — Telehealth: Payer: Self-pay | Admitting: Family Medicine

## 2020-04-11 MED ORDER — DOXYCYCLINE HYCLATE 100 MG PO TABS
100.0000 mg | ORAL_TABLET | Freq: Two times a day (BID) | ORAL | 0 refills | Status: DC
Start: 2020-04-11 — End: 2021-10-06

## 2020-04-11 NOTE — Telephone Encounter (Signed)
Please review lab results for patient

## 2020-04-11 NOTE — Telephone Encounter (Signed)
Pt requesting a call to discuss questions she has related to her recent lab results.

## 2020-04-14 ENCOUNTER — Other Ambulatory Visit: Payer: Self-pay | Admitting: Family Medicine

## 2020-04-14 MED ORDER — FLUCONAZOLE 150 MG PO TABS
ORAL_TABLET | ORAL | 0 refills | Status: DC
Start: 2020-04-14 — End: 2021-10-06

## 2020-04-14 NOTE — Telephone Encounter (Signed)
Pt is on antibiotics is it ok to send in Diflucan?

## 2020-04-14 NOTE — Telephone Encounter (Signed)
Pt called requesting that Dr Dettinger send a Rx in for Diflucan, with 1 refill since she is having to take antibiotic for possible [place] fever. Pt says she has a history of needing to take Diflucan whenever she has to be on an antibiotic.

## 2020-04-14 NOTE — Telephone Encounter (Signed)
Pt aware rx was sent into pharmacy. °

## 2020-04-15 ENCOUNTER — Telehealth: Payer: Self-pay | Admitting: Family Medicine

## 2020-04-15 LAB — LYME AB/WESTERN BLOT REFLEX
LYME DISEASE AB, QUANT, IGM: <0.8 index (ref 0.00–0.79)
Lyme IgG/IgM Ab: <0.91 {ISR} (ref 0.00–0.90)

## 2020-04-15 LAB — ROCKY MTN SPOTTED FVR ABS PNL(IGG+IGM)
RMSF IgG: NEGATIVE
RMSF IgM: 1.27 index — ABNORMAL HIGH (ref 0.00–0.89)

## 2020-04-15 LAB — ALPHA-GAL PANEL
Alpha Gal IgE*: 0.71 kU/L — ABNORMAL HIGH (ref <0.10)
Beef (Bos spp) IgE: 0.16 kU/L (ref <0.35)
Class Interpretation: 0
Class Interpretation: 0
Lamb/Mutton (Ovis spp) IgE: <0.1 kU/L (ref <0.35)
Pork (Sus spp) IgE: <0.1 kU/L (ref <0.35)

## 2020-04-15 NOTE — Telephone Encounter (Signed)
Pt called regarding her lab result for Alpha gal that she saw on her My Chart. Wants Dr Dettinger to give her more info/advice about Alpha gal because she doesn't know much about it.

## 2020-04-16 DIAGNOSIS — M9901 Segmental and somatic dysfunction of cervical region: Secondary | ICD-10-CM | POA: Diagnosis not present

## 2020-04-16 DIAGNOSIS — M5413 Radiculopathy, cervicothoracic region: Secondary | ICD-10-CM | POA: Diagnosis not present

## 2020-04-16 DIAGNOSIS — M542 Cervicalgia: Secondary | ICD-10-CM | POA: Diagnosis not present

## 2020-04-16 DIAGNOSIS — M791 Myalgia, unspecified site: Secondary | ICD-10-CM | POA: Diagnosis not present

## 2020-04-16 DIAGNOSIS — M9903 Segmental and somatic dysfunction of lumbar region: Secondary | ICD-10-CM | POA: Diagnosis not present

## 2020-04-16 DIAGNOSIS — M9902 Segmental and somatic dysfunction of thoracic region: Secondary | ICD-10-CM | POA: Diagnosis not present

## 2020-04-16 DIAGNOSIS — M545 Low back pain: Secondary | ICD-10-CM | POA: Diagnosis not present

## 2020-04-16 NOTE — Telephone Encounter (Signed)
Already answered in result note today

## 2020-04-30 DIAGNOSIS — Z23 Encounter for immunization: Secondary | ICD-10-CM | POA: Diagnosis not present

## 2020-06-10 ENCOUNTER — Other Ambulatory Visit: Payer: Self-pay

## 2020-06-10 ENCOUNTER — Ambulatory Visit (INDEPENDENT_AMBULATORY_CARE_PROVIDER_SITE_OTHER): Payer: Medicare Other | Admitting: Psychiatry

## 2020-06-10 ENCOUNTER — Encounter: Payer: Self-pay | Admitting: Psychiatry

## 2020-06-10 VITALS — BP 125/85 | HR 84

## 2020-06-10 DIAGNOSIS — F908 Attention-deficit hyperactivity disorder, other type: Secondary | ICD-10-CM | POA: Diagnosis not present

## 2020-06-10 MED ORDER — METHYLPHENIDATE HCL ER 18 MG PO TB24: 18.0000 mg | ORAL_TABLET | Freq: Every day | ORAL | 0 refills | Status: DC

## 2020-06-10 MED ORDER — METHYLPHENIDATE HCL ER (OSM) 18 MG PO TBCR: 18.0000 mg | EXTENDED_RELEASE_TABLET | Freq: Every day | ORAL | 0 refills | Status: DC

## 2020-06-10 NOTE — Progress Notes (Signed)
Teresa Tran [id number] 02-26-1981 39 y.o.  Subjective:   Patient ID:  Teresa Tran is a 39 y.o. (DOB 05/23/1981) female.  Chief Complaint:  Chief Complaint  Patient presents with  . Follow-up    ADD, h/o anxiety and mood disturbance    HPI Teresa Tran presents to the office today for follow-up of ADD and h/o anxiety and mood disturbance.  She reports that she is interested in trying to reduce dosage of Concerta. She reports that she has a headache and increased heart rate when she does not take Concerta. She reports, "I don't want to become dependent on that." She reports that her concentration is adequate. Notices that she starts to wind down around 5-6 pm. She reports that she has been sleeping well. Appetite has been stable. Denies any significant anxiety. Mood has been stable. Denies depressed mood. Energy and motivation have been ok. She reports improved physical health has been helpful for energy. Denies SI.  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    Past Psychiatric Medication Trials: Rexulti- increased HA's, drowsiness Cymbalta Buspar Cerefolin Topamax Prozac Celexa Lexapro Xanax Ativan Lithium-tremors Lamictal risperdal Seroquel Wellbutrin- Ineffective, HA Phentermine Gabapentin- drowsiness Methylphenidate-Effective for concentration in the morning Concerta- Had increased jitteriness and HR on 36 mg.  Propranolol- caused dizziness  GAD-7     Office Visit from 12/17/2019 in [place]  Total GAD-7 Score 0    PHQ2-9     Office Visit from 12/25/2019 in [place] from 12/21/2019 in [place] Visit from 12/17/2019 in [place] Visit from 09/14/2019 in [place] Office Visit from 06/06/2019 in [place]  PHQ-2 Total Score 0 0 0 0 0  PHQ-9 Total Score -- 0 0 -- --       Review of Systems:   Review of Systems  HENT:       Improved sinus s/s.  Eyes:       Improved vision  Cardiovascular:       Reports occ chest pressure  Musculoskeletal: Negative for gait problem.       She reports improved pain with alignments  Allergic/Immunologic:       Plans to have allergy work-up  Neurological: Negative for tremors.  Psychiatric/Behavioral:       Please refer to HPI  Had another tick bite and tested positive for [facility] spotted fever. 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   Medications: I have reviewed the patient's current medications.  Current Outpatient Medications  Medication Sig Dispense Refill  . acetaminophen (TYLENOL) 500 MG tablet Take 1,000 mg by mouth every 6 (six) hours as needed for mild pain or headache.    . albuterol (PROVENTIL HFA;VENTOLIN HFA) 108 (90 Base) MCG/ACT inhaler Inhale 2 puffs into the lungs every 4 (four) hours as needed for wheezing or shortness of breath. 1 Inhaler 2  . chlorthalidone (HYGROTON) 25 MG tablet Take 1 tablet (25 mg total) by mouth daily. 90 tablet 3  . dexlansoprazole (DEXILANT) 60 MG capsule Take 1 capsule (60 mg total) by mouth daily before breakfast. 30 capsule 11  . Multiple Vitamins-Minerals (IMMUNE SUPPORT PO) Take by mouth.    . doxycycline (VIBRA-TABS) 100 MG tablet Take 1 tablet (100 mg total) by mouth 2 (two) times daily. 1 po bid (Patient not taking: Reported on 06/10/2020) 28 tablet 0  . fluconazole (DIFLUCAN) 150 MG tablet Take 1  tablet by mouth now and repeat in 5 days if symptoms persist (Patient not taking: Reported on 06/10/2020) 2 tablet 0  . methylphenidate 18 MG PO CR tablet Take 1 tablet (18 mg total) by mouth daily. 30 tablet 0  . [START ON 08/05/2020] methylphenidate 18 MG PO CR tablet Take 1 tablet (18 mg total) by mouth daily. 30 tablet 0  . [START ON 07/08/2020] methylphenidate 18 MG PO CR tablet Take 1 tablet (18 mg total) by mouth daily. 30 tablet 0  . nystatin (MYCOSTATIN/NYSTOP) powder Apply 1 application  topically 3 (three) times daily. (Patient taking differently: Apply 1 application topically as needed. ) 30 g 3   No current facility-administered medications for this visit.    Medication Side Effects: Other: Brief period of jitteriness; HA and increased HR with missed doses  Allergies:  Allergies  Allergen Reactions  . Macrobid [Nitrofurantoin Monohyd Macro] Itching    Past Medical History:  Diagnosis Date  . Allergy    medicine  . Anemia   . Anxiety   . Bipolar disorder ([place])   . BV (bacterial vaginosis) 08/15/2014  . CSF leak   . Depression   . GERD (gastroesophageal reflux disease)   . Headache(784.0)   . Hypertension   . Low back pain 08/15/2014  . Lupus ([place])   . Obesity   . Ulcer   . Urinary frequency 08/30/2014  . Vaginal discharge 08/15/2014  . Vaginal irritation 02/12/2014  . Yeast infection 02/12/2014    Family History  Problem Relation Age of Onset  . Depression Father   . Anxiety disorder Father   . Diabetes Father   . Hypertension Father   . Hyperlipidemia Father   . Kidney disease Father   . Mental illness Father   . Pneumonia Father   . Stroke Father   . Depression Sister   . Anxiety disorder Sister   . Cancer Sister        cervical  . Stroke Sister   . Depression Brother   . Anxiety disorder Brother   . Cancer - Other Paternal Grandfather           . Cancer Paternal Grandfather        prostate  . Kidney disease Mother   . Diabetes Mother   . Cancer Mother        kidney  . Hyperlipidemia Mother   . Hypertension Mother   . Stroke Mother   . Aneurysm Maternal Grandfather   . Cancer Maternal Grandmother 73       breast  . Cancer Paternal Grandmother        bladder  . ADD / ADHD Neg Hx   . Alcohol abuse Neg Hx   . Drug abuse Neg Hx   . Bipolar disorder Neg Hx   . Dementia Neg Hx   . OCD Neg Hx   . Paranoid behavior Neg Hx   . Schizophrenia Neg Hx   . Seizures Neg Hx   . Sexual abuse Neg Hx   . Physical abuse Neg Hx   . Colon  cancer Neg Hx     Social History   Socioeconomic History  . Marital status: Married    Spouse name: Nathaneil Canary  . Number of children: 2  . Years of education: 16  . Highest education level: Bachelor's degree (e.g., BA, AB, BS)  Occupational History  . Occupation: homehealth  Tobacco Use  . Smoking status: Never Smoker  . Smokeless tobacco: Never Used  Vaping  Use  . Vaping Use: Never used  Substance and Sexual Activity  . Alcohol use: No    Alcohol/week: 0.0 standard drinks  . Drug use: No  . Sexual activity: Yes    Birth control/protection: Surgical    Comment: tubal  Other Topics Concern  . Not on file  Social History Narrative   Working on Oceanographer in [employer]   Lives with husband and 2 children   One Neurosurgeon   Active in church   Social Determinants of Health   Financial Resource Strain: [employer]   . Difficulty of Paying Living Expenses: Not hard at all  Food Insecurity: No Food Insecurity  . Worried About Charity fundraiser in the Last Year: Never true  . Ran Out of Food in the Last Year: Never true  Transportation Needs: No Transportation Needs  . Lack of Transportation (Medical): No  . Lack of Transportation (Non-Medical): No  Physical Activity: Insufficiently Active  . Days of Exercise per Week: 2 days  . Minutes of Exercise per Session: 60 min  Stress: No Stress Concern Present  . Feeling of Stress : Not at all  Social Connections: Socially Integrated  . Frequency of Communication with Friends and Family: More than three times a week  . Frequency of Social Gatherings with Friends and Family: More than three times a week  . Attends Religious Services: More than 4 times per year  . Active Member of Clubs or Organizations: Yes  . Attends Archivist Meetings: More than 4 times per year  . Marital Status: Married  Human resources officer Violence: Not At Risk  . Fear of Current or Ex-Partner: No  . Emotionally Abused: No  . Physically Abused: No  .  Sexually Abused: No    Past Medical History, Surgical history, Social history, and Family history were reviewed and updated as appropriate.   Please see review of systems for further details on the patient's review from today.   Objective:   Physical Exam:  BP 125/85   Pulse 84   Physical Exam Constitutional:      General: She is not in acute distress. Musculoskeletal:        General: No deformity.  Neurological:     Mental Status: She is alert and oriented to person, place, and time.     Coordination: Coordination normal.  Psychiatric:        Attention and Perception: Attention and perception normal. She does not perceive auditory or visual hallucinations.        Mood and Affect: Mood normal. Mood is not anxious or depressed. Affect is not labile, blunt, angry or inappropriate.        Speech: Speech normal.        Behavior: Behavior normal.        Thought Content: Thought content normal. Thought content is not paranoid or delusional. Thought content does not include homicidal or suicidal ideation. Thought content does not include homicidal or suicidal plan.        Cognition and Memory: Cognition and memory normal.        Judgment: Judgment normal.     Comments: Insight intact     Lab Review:     Component Value Date/Time   NA 142 12/17/2019 1039   K 3.3 (L) 12/17/2019 1039   CL 100 12/17/2019 1039   CO2 19 (L) 12/17/2019 1039   GLUCOSE 89 12/17/2019 1039   GLUCOSE 102 (H) 08/22/2018 1235   BUN 10 12/17/2019  1039   CREATININE 0.79 12/17/2019 1039   CREATININE 0.64 09/01/2017 1635   CALCIUM 9.9 12/17/2019 1039   PROT 7.3 12/17/2019 1039   ALBUMIN 4.5 12/17/2019 1039   AST 28 12/17/2019 1039   ALT 30 12/17/2019 1039   ALKPHOS 79 12/17/2019 1039   BILITOT 0.5 12/17/2019 1039   GFRNONAA 96 12/17/2019 1039   GFRNONAA 116 09/01/2017 1635   GFRAA 111 12/17/2019 1039   GFRAA 134 09/01/2017 1635       Component Value Date/Time   WBC 6.7 12/17/2019 1039   WBC 7.7  08/22/2018 1235   RBC 5.19 12/17/2019 1039   RBC 4.61 08/22/2018 1235   HGB 13.6 12/17/2019 1039   HCT 40.7 12/17/2019 1039   PLT 210 12/17/2019 1039   MCV 78 (L) 12/17/2019 1039   MCH 26.2 (L) 12/17/2019 1039   MCH 25.8 (L) 08/22/2018 1235   MCHC 33.4 12/17/2019 1039   MCHC 30.7 08/22/2018 1235   RDW 14.7 12/17/2019 1039   LYMPHSABS 1.3 12/17/2019 1039   MONOABS 0.5 10/09/2016 1416   EOSABS 0.0 12/17/2019 1039   BASOSABS 0.1 12/17/2019 1039    Lithium Lvl  Date Value Ref Range Status  08/22/2014 0.80 0.80 - 1.40 mEq/L Final     No results found for: PHENYTOIN, PHENOBARB, VALPROATE, CBMZ   .res Assessment: Plan:   Will decrease Concerta from 27mg  to 18 mg daily at the request patient since she would prefer to try to take lowest dose possible. Patient to follow-up in 3 months or sooner if clinically indicated. Patient advised to contact office with any questions, adverse effects, or acute worsening in signs and symptoms.  Laurren was seen today for follow-up.  Diagnoses and all orders for this visit:  Attention deficit hyperactivity disorder (ADHD), other type -     methylphenidate 18 MG PO CR tablet; Take 1 tablet (18 mg total) by mouth daily. -     methylphenidate 18 MG PO CR tablet; Take 1 tablet (18 mg total) by mouth daily. -     methylphenidate 18 MG PO CR tablet; Take 1 tablet (18 mg total) by mouth daily.     Please see After Visit Summary for patient specific instructions.  Future Appointments  Date Time Provider Export  06/16/2020  9:10 AM Dettinger, Fransisca Kaufmann, MD [facility] None  09/11/2020  9:30 AM Thayer Headings, PMHNP [facility] None  12/24/2020 10:30 AM [facility]-ANNUAL WELLNESS VISIT [facility] None    No orders of the defined types were placed in this encounter.   -------------------------------

## 2020-06-16 ENCOUNTER — Ambulatory Visit: Payer: Medicare Other | Admitting: Family Medicine

## 2020-06-16 ENCOUNTER — Encounter: Payer: Self-pay | Admitting: Family Medicine

## 2020-06-20 ENCOUNTER — Telehealth: Payer: Self-pay | Admitting: Psychiatry

## 2020-06-20 DIAGNOSIS — F3281 Premenstrual dysphoric disorder: Secondary | ICD-10-CM

## 2020-06-20 MED ORDER — FLUOXETINE HCL 10 MG PO CAPS: ORAL_CAPSULE | ORAL | 3 refills | Status: DC

## 2020-06-20 NOTE — Telephone Encounter (Signed)
Pt called and said that she has some concerns about her medication and would like to talk to [place]. Please give her a call at [phone]

## 2020-06-20 NOTE — Telephone Encounter (Signed)
Returned call to pt. She reports that she has noticed changes around her menstrual cycle with period of sadness, irritability, anxiety, and tearfulness. She reports that she has had more bloating and abdominal pain. She reports that she did not have these s/s in the past until the last few months. She notices onset of s/s about 1-1.5 weeks prior to the onset of menses and s/s resolve after starting period.   Discussed PMDD and treatment options. Discussed potential benefits, risks, or side effects of Prozac. Will start Prozac 10 mg po qd 10-14 days prior to the onset of menses and stop at the onset of menses. Discussed that she may also want to contact obgyn to discuss changes in menses.  Patient advised to contact office with any questions, adverse effects, or acute worsening in signs and symptoms.

## 2020-07-08 ENCOUNTER — Telehealth: Payer: Self-pay | Admitting: Internal Medicine

## 2020-07-08 NOTE — Telephone Encounter (Signed)
Letter mailed

## 2020-07-08 NOTE — Telephone Encounter (Signed)
Recall for ultrasound 

## 2020-07-15 ENCOUNTER — Telehealth: Payer: Self-pay | Admitting: Internal Medicine

## 2020-07-15 DIAGNOSIS — K760 Fatty (change of) liver, not elsewhere classified: Secondary | ICD-10-CM

## 2020-07-15 NOTE — Telephone Encounter (Signed)
Patient received letter to schedule ultrasound, please call [phone]

## 2020-07-15 NOTE — Telephone Encounter (Signed)
[name] scheduled and letter mailed with appt information

## 2020-07-15 NOTE — Addendum Note (Signed)
Addended by: Cheron Every on: 07/15/2020 04:10 PM   Modules accepted: Orders

## 2020-07-22 ENCOUNTER — Other Ambulatory Visit: Payer: Self-pay

## 2020-07-22 DIAGNOSIS — K760 Fatty (change of) liver, not elsewhere classified: Secondary | ICD-10-CM

## 2020-07-22 DIAGNOSIS — D8989 Other specified disorders involving the immune mechanism, not elsewhere classified: Secondary | ICD-10-CM

## 2020-08-01 ENCOUNTER — Other Ambulatory Visit: Payer: Self-pay | Admitting: Gastroenterology

## 2020-08-01 DIAGNOSIS — D8989 Other specified disorders involving the immune mechanism, not elsewhere classified: Secondary | ICD-10-CM | POA: Diagnosis not present

## 2020-08-01 DIAGNOSIS — K760 Fatty (change of) liver, not elsewhere classified: Secondary | ICD-10-CM | POA: Diagnosis not present

## 2020-08-05 LAB — HEPATIC FUNCTION PANEL
AG Ratio: 1.6 (calc) (ref 1.0–2.5)
ALT: 10 U/L (ref 6–29)
AST: 14 U/L (ref 10–30)
Albumin: 4 g/dL (ref 3.6–5.1)
Alkaline phosphatase (APISO): 65 U/L (ref 31–125)
Bilirubin, Direct: 0.1 mg/dL (ref 0.0–0.2)
Globulin: 2.5 g/dL (calc) (ref 1.9–3.7)
Indirect Bilirubin: 0.2 mg/dL (calc) (ref 0.2–1.2)
Total Bilirubin: 0.3 mg/dL (ref 0.2–1.2)
Total Protein: 6.5 g/dL (ref 6.1–8.1)

## 2020-08-05 LAB — MITOCHONDRIAL AB RFLX TITR: Mitochondrial Antibody Screen: NEGATIVE

## 2020-08-15 ENCOUNTER — Other Ambulatory Visit: Payer: Self-pay

## 2020-08-15 ENCOUNTER — Ambulatory Visit (HOSPITAL_COMMUNITY)
Admission: RE | Admit: 2020-08-15 | Discharge: 2020-08-15 | Disposition: A | Discharge status: Home / Self Care | Payer: Medicare Other | Source: Ambulatory Visit | Attending: Gastroenterology | Admitting: Gastroenterology

## 2020-08-15 DIAGNOSIS — K760 Fatty (change of) liver, not elsewhere classified: Secondary | ICD-10-CM | POA: Diagnosis not present

## 2020-08-18 ENCOUNTER — Ambulatory Visit: Payer: Medicare Other | Admitting: Adult Health

## 2020-09-08 ENCOUNTER — Other Ambulatory Visit: Payer: Self-pay

## 2020-09-08 ENCOUNTER — Encounter: Payer: Self-pay | Admitting: Psychiatry

## 2020-09-08 ENCOUNTER — Ambulatory Visit (INDEPENDENT_AMBULATORY_CARE_PROVIDER_SITE_OTHER): Payer: Medicare Other | Admitting: Psychiatry

## 2020-09-08 DIAGNOSIS — F908 Attention-deficit hyperactivity disorder, other type: Secondary | ICD-10-CM

## 2020-09-08 MED ORDER — METHYLPHENIDATE HCL ER 18 MG PO TB24: 18.0000 mg | ORAL_TABLET | Freq: Every day | ORAL | 0 refills | Status: DC

## 2020-09-08 MED ORDER — METHYLPHENIDATE HCL ER (OSM) 18 MG PO TBCR: 18.0000 mg | EXTENDED_RELEASE_TABLET | Freq: Every day | ORAL | 0 refills | Status: DC

## 2020-09-08 NOTE — Progress Notes (Signed)
[name] [id number] Nov 16, 1980 39 y.o.  Subjective:   Patient ID:  Teresa Tran is a 39 y.o. (DOB [birth year]) female.  Chief Complaint:  Chief Complaint  Patient presents with  . ADD  . Follow-up    h/o anxiety, depression    HPI Teresa Tran presents to the office today for follow-up of anxiety, mood disturbance, and concentration difficulties. She reports that she has noticed some stress eating with eating on the go, eating too late, and not eating as healthy foods. She reports that she has not had any significant anxiety to include panic attacks.  She reports that she took Prozac one month and seemed to feel worse. She reports that her mood s/s were better during last menstrual cycle and she did not take Prozac.  She reports poor concentration and energy without Concerta. She reports "I'm functioning" on the days that she takes Concerta. She reports that she wishes that her energy was higher. She reports that her motivation is good and would like to do more. Sleeping well. Denies SI.    Past Psychiatric Medication Trials: Rexulti- increased HA's, drowsiness Cymbalta Buspar Cerefolin Topamax Prozac Celexa Lexapro Xanax Ativan Lithium-tremors Lamictal risperdal Seroquel Wellbutrin- Ineffective, HA Phentermine Gabapentin- drowsiness Methylphenidate-Effective for concentration in the morning Concerta- Had increased jitteriness and HR on 36 mg. Propranolol- caused dizziness  GAD-7     Office Visit from 12/17/2019 in [facility]  Total GAD-7 Score 0    PHQ2-9     Office Visit from 12/25/2019 in [facility] from 12/21/2019 in [place] Visit from 12/17/2019 in [facility] Visit from 09/14/2019 in [facility] Office Visit from 06/06/2019 in [place]  PHQ-2 Total Score 0 0 0 0 0  PHQ-9 Total Score -- 0 0 -- --       Review of  Systems:  Review of Systems  Cardiovascular: Negative for palpitations.  Musculoskeletal: Positive for arthralgias. Negative for gait problem.  Neurological: Positive for headaches. Negative for tremors.  Psychiatric/Behavioral:       Please refer to HPI    Medications: I have reviewed the patient's current medications.  Current Outpatient Medications  Medication Sig Dispense Refill  . acetaminophen (TYLENOL) 500 MG tablet Take 1,000 mg by mouth every 6 (six) hours as needed for mild pain or headache.    . albuterol (PROVENTIL HFA;VENTOLIN HFA) 108 (90 Base) MCG/ACT inhaler Inhale 2 puffs into the lungs every 4 (four) hours as needed for wheezing or shortness of breath. 1 Inhaler 2  . chlorthalidone (HYGROTON) 25 MG tablet Take 1 tablet (25 mg total) by mouth daily. 90 tablet 3  . cholecalciferol (VITAMIN D3) 25 MCG (1000 UNIT) tablet Take 1,000 Units by mouth daily.    Marland Kitchen dexlansoprazole (DEXILANT) 60 MG capsule Take 1 capsule (60 mg total) by mouth daily before breakfast. 30 capsule 11  . [START ON 11/15/2020] methylphenidate 18 MG PO CR tablet Take 1 tablet (18 mg total) by mouth daily. 30 tablet 0  . [START ON 10/18/2020] methylphenidate 18 MG PO CR tablet Take 1 tablet (18 mg total) by mouth daily. 30 tablet 0  . [START ON 09/20/2020] methylphenidate 18 MG PO CR tablet Take 1 tablet (18 mg total) by mouth daily. 30 tablet 0  . Multiple Vitamins-Minerals (IMMUNE SUPPORT PO) Take by mouth.    . nystatin (MYCOSTATIN/NYSTOP) powder Apply 1 application topically 3 (three) times daily. (Patient  taking differently: Apply 1 application topically as needed. ) 30 g 3  . doxycycline (VIBRA-TABS) 100 MG tablet Take 1 tablet (100 mg total) by mouth 2 (two) times daily. 1 po bid (Patient not taking: Reported on 06/10/2020) 28 tablet 0  . fluconazole (DIFLUCAN) 150 MG tablet Take 1 tablet by mouth now and repeat in 5 days if symptoms persist (Patient not taking: Reported on 06/10/2020) 2 tablet 0  .  FLUoxetine (PROZAC) 10 MG capsule Take 1 capsule po qd starting about 10-14 days before onset menses and stop at onset of menses. (Patient not taking: Reported on 09/08/2020) 15 capsule 3   No current facility-administered medications for this visit.    Medication Side Effects: None  Allergies:  Allergies  Allergen Reactions  . Macrobid [Nitrofurantoin Monohyd Macro] Itching    Past Medical History:  Diagnosis Date  . Allergy    medicine  . Anemia   . Anxiety   . Bipolar disorder ([place])   . BV (bacterial vaginosis) 08/15/2014  . CSF leak   . Depression   . GERD (gastroesophageal reflux disease)   . Headache(784.0)   . Hypertension   . Low back pain 08/15/2014  . Lupus ([place])   . Obesity   . Ulcer   . Urinary frequency 08/30/2014  . Vaginal discharge 08/15/2014  . Vaginal irritation 02/12/2014  . Yeast infection 02/12/2014    Family History  Problem Relation Age of Onset  . Depression Father   . Anxiety disorder Father   . Diabetes Father   . Hypertension Father   . Hyperlipidemia Father   . Kidney disease Father   . Mental illness Father   . Pneumonia Father   . Stroke Father   . Depression Sister   . Anxiety disorder Sister   . Cancer Sister        cervical  . Stroke Sister   . Depression Brother   . Anxiety disorder Brother   . Cancer - Other Paternal Grandfather           . Cancer Paternal Grandfather        prostate  . Kidney disease Mother   . Diabetes Mother   . Cancer Mother        kidney  . Hyperlipidemia Mother   . Hypertension Mother   . Stroke Mother   . Aneurysm Maternal Grandfather   . Cancer Maternal Grandmother 48       breast  . Cancer Paternal Grandmother        bladder  . ADD / ADHD Neg Hx   . Alcohol abuse Neg Hx   . Drug abuse Neg Hx   . Bipolar disorder Neg Hx   . Dementia Neg Hx   . OCD Neg Hx   . Paranoid behavior Neg Hx   . Schizophrenia Neg Hx   . Seizures Neg Hx   . Sexual abuse Neg Hx   . Physical abuse Neg Hx   . Colon  cancer Neg Hx     Social History   Socioeconomic History  . Marital status: Married    Spouse name: Nathaneil Canary  . Number of children: 2  . Years of education: 16  . Highest education level: Bachelor's degree (e.g., BA, AB, BS)  Occupational History  . Occupation: homehealth  Tobacco Use  . Smoking status: Never Smoker  . Smokeless tobacco: Never Used  Vaping Use  . Vaping Use: Never used  Substance and Sexual Activity  . Alcohol use: No  Alcohol/week: 0.0 standard drinks  . Drug use: No  . Sexual activity: Yes    Birth control/protection: Surgical    Comment: tubal  Other Topics Concern  . Not on file  Social History Narrative   Working on Oceanographer in [employer]   Lives with husband and 2 children   One Neurosurgeon   Active in church   Social Determinants of Health   Financial Resource Strain: [place]   . Difficulty of Paying Living Expenses: Not hard at all  Food Insecurity: No Food Insecurity  . Worried About Charity fundraiser in the Last Year: Never true  . Ran Out of Food in the Last Year: Never true  Transportation Needs: No Transportation Needs  . Lack of Transportation (Medical): No  . Lack of Transportation (Non-Medical): No  Physical Activity: Insufficiently Active  . Days of Exercise per Week: 2 days  . Minutes of Exercise per Session: 60 min  Stress: No Stress Concern Present  . Feeling of Stress : Not at all  Social Connections: Socially Integrated  . Frequency of Communication with Friends and Family: More than three times a week  . Frequency of Social Gatherings with Friends and Family: More than three times a week  . Attends Religious Services: More than 4 times per year  . Active Member of Clubs or Organizations: Yes  . Attends Archivist Meetings: More than 4 times per year  . Marital Status: Married  Human resources officer Violence: Not At Risk  . Fear of Current or Ex-Partner: No  . Emotionally Abused: No  . Physically Abused: No  .  Sexually Abused: No    Past Medical History, Surgical history, Social history, and Family history were reviewed and updated as appropriate.   Please see review of systems for further details on the patient's review from today.   Objective:   Physical Exam:  BP 127/82   Pulse 72   Physical Exam Constitutional:      General: She is not in acute distress. Musculoskeletal:        General: No deformity.  Neurological:     Mental Status: She is alert and oriented to person, place, and time.     Coordination: Coordination normal.  Psychiatric:        Attention and Perception: Attention and perception normal. She does not perceive auditory or visual hallucinations.        Mood and Affect: Mood normal. Mood is not anxious or depressed. Affect is not labile, blunt, angry or inappropriate.        Speech: Speech normal.        Behavior: Behavior normal.        Thought Content: Thought content normal. Thought content is not paranoid or delusional. Thought content does not include homicidal or suicidal ideation. Thought content does not include homicidal or suicidal plan.        Cognition and Memory: Cognition and memory normal.        Judgment: Judgment normal.     Comments: Insight intact     Lab Review:     Component Value Date/Time   NA 142 12/17/2019 1039   K 3.3 (L) 12/17/2019 1039   CL 100 12/17/2019 1039   CO2 19 (L) 12/17/2019 1039   GLUCOSE 89 12/17/2019 1039   GLUCOSE 102 (H) 08/22/2018 1235   BUN 10 12/17/2019 1039   CREATININE 0.79 12/17/2019 1039   CREATININE 0.64 09/01/2017 1635   CALCIUM 9.9 12/17/2019 1039  PROT 6.5 08/01/2020 0844   PROT 7.3 12/17/2019 1039   ALBUMIN 4.5 12/17/2019 1039   AST 14 08/01/2020 0844   ALT 10 08/01/2020 0844   ALKPHOS 79 12/17/2019 1039   BILITOT 0.3 08/01/2020 0844   BILITOT 0.5 12/17/2019 1039   GFRNONAA 96 12/17/2019 1039   GFRNONAA 116 09/01/2017 1635   GFRAA 111 12/17/2019 1039   GFRAA 134 09/01/2017 1635        Component Value Date/Time   WBC 6.7 12/17/2019 1039   WBC 7.7 08/22/2018 1235   RBC 5.19 12/17/2019 1039   RBC 4.61 08/22/2018 1235   HGB 13.6 12/17/2019 1039   HCT 40.7 12/17/2019 1039   PLT 210 12/17/2019 1039   MCV 78 (L) 12/17/2019 1039   MCH 26.2 (L) 12/17/2019 1039   MCH 25.8 (L) 08/22/2018 1235   MCHC 33.4 12/17/2019 1039   MCHC 30.7 08/22/2018 1235   RDW 14.7 12/17/2019 1039   LYMPHSABS 1.3 12/17/2019 1039   MONOABS 0.5 10/09/2016 1416   EOSABS 0.0 12/17/2019 1039   BASOSABS 0.1 12/17/2019 1039    Lithium Lvl  Date Value Ref Range Status  08/22/2014 0.80 0.80 - 1.40 mEq/L Final     No results found for: PHENYTOIN, PHENOBARB, VALPROATE, CBMZ   .res Assessment: Plan:   We will continue Concerta 18 mg daily since patient reports that this dose has been well-tolerated.  She reports that 18 mg dose is less effective than higher doses, however this dose is partially effective and if not causing side effects. Her mood and anxiety signs and symptoms remain well controlled overall. No further medication recommendations at this time. Patient to follow-up in 3 months or sooner if clinically indicated. Patient advised to contact office with any questions, adverse effects, or acute worsening in signs and symptoms.  Philip was seen today for add and follow-up.  Diagnoses and all orders for this visit:  Attention deficit hyperactivity disorder (ADHD), other type -     methylphenidate 18 MG PO CR tablet; Take 1 tablet (18 mg total) by mouth daily. -     methylphenidate 18 MG PO CR tablet; Take 1 tablet (18 mg total) by mouth daily. -     methylphenidate 18 MG PO CR tablet; Take 1 tablet (18 mg total) by mouth daily.     Please see After Visit Summary for patient specific instructions.  Future Appointments  Date Time Provider [place]  12/09/2020 10:00 AM Thayer Headings, PMHNP [facility] None  12/24/2020 10:30 AM [facility]-ANNUAL WELLNESS VISIT [facility] None    No orders of  the defined types were placed in this encounter.   -------------------------------

## 2020-09-11 ENCOUNTER — Ambulatory Visit: Payer: Medicare Other | Admitting: Psychiatry

## 2020-09-25 DIAGNOSIS — Z23 Encounter for immunization: Secondary | ICD-10-CM | POA: Diagnosis not present

## 2020-10-30 ENCOUNTER — Encounter: Payer: Self-pay | Admitting: Emergency Medicine

## 2020-10-30 ENCOUNTER — Other Ambulatory Visit: Payer: Self-pay

## 2020-10-30 ENCOUNTER — Ambulatory Visit
Admission: EM | Admit: 2020-10-30 | Discharge: 2020-10-30 | Disposition: A | Discharge status: Home / Self Care | Payer: Medicare Other | Attending: Emergency Medicine | Admitting: Emergency Medicine

## 2020-10-30 DIAGNOSIS — J029 Acute pharyngitis, unspecified: Secondary | ICD-10-CM | POA: Diagnosis not present

## 2020-10-30 DIAGNOSIS — J069 Acute upper respiratory infection, unspecified: Secondary | ICD-10-CM

## 2020-10-30 DIAGNOSIS — Z1152 Encounter for screening for COVID-19: Secondary | ICD-10-CM

## 2020-10-30 DIAGNOSIS — R059 Cough, unspecified: Secondary | ICD-10-CM

## 2020-10-30 LAB — POCT RAPID STREP A (OFFICE): Rapid Strep A Screen: NEGATIVE

## 2020-10-30 MED ORDER — BENZONATATE 100 MG PO CAPS: 100.0000 mg | ORAL_CAPSULE | Freq: Three times a day (TID) | ORAL | 0 refills | Status: DC | PRN

## 2020-10-30 MED ORDER — CETIRIZINE HCL 10 MG PO TABS: 10.0000 mg | ORAL_TABLET | Freq: Every day | ORAL | 0 refills | Status: DC

## 2020-10-30 MED ORDER — PREDNISONE 10 MG PO TABS: 20.0000 mg | ORAL_TABLET | Freq: Every day | ORAL | 0 refills | Status: DC

## 2020-10-30 MED ORDER — FLUTICASONE PROPIONATE 50 MCG/ACT NA SUSP: 1.0000 | Freq: Every day | NASAL | 0 refills | Status: DC

## 2020-10-30 NOTE — Discharge Instructions (Signed)
Strep test is negative.  Sample will be sent for culture and someone will call if your result is abnormal.  COVID-19, flu A/B testing ordered.  It will take between 2-7 days for test results.  Someone will contact you regarding abnormal results.    In the meantime: You should remain isolated in your home for 10 days from symptom onset AND greater than 24 hours after symptoms resolution (absence of fever without the use of fever-reducing medication and improvement in respiratory symptoms), whichever is longer Get plenty of rest and push fluids Tessalon Perles prescribed for cough Zyrtec for nasal congestion, runny nose, and/or sore throat Flonase for nasal congestion and runny nose Prednisone was prescribed Use medications daily for symptom relief Use OTC medications like ibuprofen or tylenol as needed fever or pain Call or go to the ED if you have any new or worsening symptoms such as fever, worsening cough, shortness of breath, chest tightness, chest pain, turning blue, changes in mental status, etc..Marland Kitchen

## 2020-10-30 NOTE — ED Triage Notes (Signed)
Cough, congestion and loosing voice since Tuesday.

## 2020-10-30 NOTE — ED Provider Notes (Signed)
[facility]   [id number] 10/30/20 Arrival Time: [id number]   CC: COVID symptoms  SUBJECTIVE: History from: patient and family.  Teresa Tran is a 39 y.o. female who presented to the urgent care with a complaint of cough, sore throat, nasal congestion and wheezing was for the past 2 to 3 days.  Denies sick exposure to COVID, flu or strep.  Denies recent travel.  Has tried OTC medication without relief.  Denies aggravating factors.  Denies previous symptoms in the past.   Denies fever, chills, fatigue, sinus pain, rhinorrhea, sore throat, SOB, wheezing, chest pain, nausea, changes in bowel or bladder habits.      ROS: As per HPI.  All other pertinent ROS negative.     Past Medical History:  Diagnosis Date  . Allergy    medicine  . Anemia   . Anxiety   . Bipolar disorder ([place])   . BV (bacterial vaginosis) 08/15/2014  . CSF leak   . Depression   . GERD (gastroesophageal reflux disease)   . Headache(784.0)   . Hypertension   . Low back pain 08/15/2014  . Lupus ([place])   . Obesity   . Ulcer   . Urinary frequency 08/30/2014  . Vaginal discharge 08/15/2014  . Vaginal irritation 02/12/2014  . Yeast infection 02/12/2014   Past Surgical History:  Procedure Laterality Date  . CHOLECYSTECTOMY  Feb 2015   Dr. Ladona Horns, [place]  . ENDOSCOPIC CONCHA BULLOSA RESECTION Left 07/20/2016   Procedure: ENDOSCOPIC LEFT  CONCHA BULLOSA RESECTION;  Surgeon: Leta Baptist, MD;  Location: [facility];  Service: ENT;  Laterality: Left;  . 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 ESOPHAGOGASTRODUODENOSCOPY N/A 05/06/2015   Procedure: ESOPHAGOGASTRODUODENOSCOPY (EGD);  Surgeon: Danie Binder, MD;  [facility];  Service: Endoscopy;  Laterality: N/A;  1300 - moved to 1:15 - office to notify  . ESOPHAGOGASTRODUODENOSCOPY (EGD) WITH ESOPHAGEAL DILATION N/A 02/11/2014   Dr. 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 EXCISION CHONCHA BULLOSA N/A 07/20/2016   Procedure: NASAL SEPTOPLASTY WITH BILATERAL TURBINATE REDUCTION;  Surgeon: Leta Baptist, MD;  Location: [facility];  Service: ENT;  Laterality: N/A;  . RHINOPLASTY    . SAVORY DILATION N/A 05/06/2015   Procedure: SAVORY DILATION;  Surgeon: Danie Binder, MD;  [facility];  Service: Endoscopy;  Laterality: N/A;  . TUBAL LIGATION     Allergies  Allergen Reactions  . Macrobid [Nitrofurantoin Monohyd Macro] Itching   No current facility-administered medications on file prior to encounter.   Current Outpatient Medications on File Prior to Encounter  Medication Sig Dispense Refill  . acetaminophen (TYLENOL) 500 MG tablet Take 1,000 mg by mouth every 6 (six) hours as needed for mild pain or headache.    . albuterol (PROVENTIL HFA;VENTOLIN HFA) 108 (90 Base) MCG/ACT inhaler Inhale 2 puffs into the lungs every 4 (four) hours as needed for wheezing or shortness of breath. 1 Inhaler 2  . chlorthalidone (HYGROTON) 25 MG tablet Take 1 tablet (25 mg total) by mouth daily. 90 tablet 3  . cholecalciferol (VITAMIN D3) 25 MCG (1000 UNIT) tablet Take 1,000 Units by mouth daily.    Marland Kitchen dexlansoprazole (DEXILANT) 60 MG capsule Take 1 capsule (60 mg total) by mouth daily before breakfast. 30 capsule 11  . doxycycline (VIBRA-TABS) 100 MG tablet Take 1 tablet (100 mg total) by mouth 2 (two) times daily. 1 po  bid (Patient not taking: Reported on 06/10/2020) 28 tablet 0  . fluconazole (DIFLUCAN) 150 MG tablet Take 1 tablet by mouth now and repeat in 5 days if symptoms persist (Patient not taking: Reported on 06/10/2020) 2 tablet 0  . FLUoxetine (PROZAC) 10 MG capsule Take 1 capsule po qd starting about 10-14 days before onset menses and stop at onset of menses. (Patient not taking: Reported on 09/08/2020) 15 capsule 3  . [START ON 11/15/2020] methylphenidate 18 MG PO CR tablet Take 1 tablet (18 mg total) by  mouth daily. 30 tablet 0  . methylphenidate 18 MG PO CR tablet Take 1 tablet (18 mg total) by mouth daily. 30 tablet 0  . methylphenidate 18 MG PO CR tablet Take 1 tablet (18 mg total) by mouth daily. 30 tablet 0  . Multiple Vitamins-Minerals (IMMUNE SUPPORT PO) Take by mouth.    . nystatin (MYCOSTATIN/NYSTOP) powder Apply 1 application topically 3 (three) times daily. (Patient taking differently: Apply 1 application topically as needed. ) 30 g 3   Social History   Socioeconomic History  . Marital status: Married    Spouse name: Nathaneil Canary  . Number of children: 2  . Years of education: 16  . Highest education level: Bachelor's degree (e.g., BA, AB, BS)  Occupational History  . Occupation: homehealth  Tobacco Use  . Smoking status: Never Smoker  . Smokeless tobacco: Never Used  Vaping Use  . Vaping Use: Never used  Substance and Sexual Activity  . Alcohol use: No    Alcohol/week: 0.0 standard drinks  . Drug use: No  . Sexual activity: Yes    Birth control/protection: Surgical    Comment: tubal  Other Topics Concern  . 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 Difficulty of Paying Living Expenses: Not hard at all  Food Insecurity: No Food Insecurity  . Worried About Charity fundraiser in the Last Year: Never true  . Ran Out of Food in the Last Year: Never true  Transportation Needs: No Transportation Needs  . Lack of Transportation (Medical): No  . Lack of Transportation (Non-Medical): No  Physical Activity: Insufficiently Active  . Days of Exercise per Week: 2 days  . Minutes of Exercise per Session: 60 min  Stress: No Stress Concern Present  . Feeling of Stress : Not at all  Social Connections: Socially Integrated  . Frequency of Communication with Friends and Family: More than three times  a week  . Frequency of Social Gatherings with Friends and Family: More than three times a week  . Attends Religious Services: More than 4 times per year  . Active Member of Clubs or Organizations: Yes  . Attends Archivist Meetings: More than 4 times per year  . Marital Status: Married  Human resources officer Violence: Not At Risk  . Fear of Current or Ex-Partner: No  . Emotionally Abused: No  . Physically Abused: No  . Sexually Abused: No   Family History  Problem Relation Age of Onset  . Depression Father   . Anxiety disorder Father   . Diabetes Father   . Hypertension Father   . Hyperlipidemia Father   . Kidney disease Father   . Mental illness Father   . Pneumonia Father   .  Stroke Father   . Depression Sister   . Anxiety disorder Sister   . Cancer Sister        cervical  . Stroke Sister   . Depression Brother   . Anxiety disorder Brother   . Cancer - Other Paternal Grandfather           . Cancer Paternal Grandfather        prostate  . Kidney disease Mother   . Diabetes Mother   . Cancer Mother        kidney  . Hyperlipidemia Mother   . Hypertension Mother   . Stroke Mother   . Aneurysm Maternal Grandfather   . Cancer Maternal Grandmother 78       breast  . Cancer Paternal Grandmother        bladder  . ADD / ADHD Neg Hx   . Alcohol abuse Neg Hx   . Drug abuse Neg Hx   . Bipolar disorder Neg Hx   . Dementia Neg Hx   . OCD Neg Hx   . Paranoid behavior Neg Hx   . Schizophrenia Neg Hx   . Seizures Neg Hx   . Sexual abuse Neg Hx   . Physical abuse Neg Hx   . Colon cancer Neg Hx     OBJECTIVE:  Vitals:   10/30/20 1134 10/30/20 1136  BP:  125/87  Pulse:  95  Resp:  18  Temp:  98.8 F (37.1 C)  TempSrc:  Oral  SpO2:  97%  Weight: 169 lb 12.1 oz (77 kg)   Height: 5' (1.524 m)      General appearance: alert; appears fatigued, but nontoxic; speaking in full sentences and tolerating own secretions HEENT: NCAT; Ears: EACs clear, TMs pearly gray;  Eyes: PERRL.  EOM grossly intact. Sinuses: nontender; Nose: nares patent without rhinorrhea, Throat: oropharynx clear, tonsils non erythematous or enlarged, uvula midline  Neck: supple without LAD Lungs: unlabored respirations, symmetrical air entry; cough: moderate; no respiratory distress; CTAB Heart: regular rate and rhythm.  Radial pulses 2+ symmetrical bilaterally Skin: warm and dry Psychological: alert and cooperative; normal mood and affect  LABS:  Results for orders placed or performed during the hospital encounter of 10/30/20 (from the past 24 hour(s))  POCT rapid strep A     Status: None   Collection Time: 10/30/20 11:47 AM  Result Value Ref Range   Rapid Strep A Screen Negative Negative     ASSESSMENT & PLAN:  1. URI with cough and congestion   2. Encounter for screening for COVID-19   3. Sore throat     Meds ordered this encounter  Medications  . fluticasone (FLONASE) 50 MCG/ACT nasal spray    Sig: Place 1 spray into both nostrils daily for 14 days.    Dispense:  16 g    Refill:  0  . predniSONE (DELTASONE) 10 MG tablet    Sig: Take 2 tablets (20 mg total) by mouth daily.    Dispense:  15 tablet    Refill:  0  . cetirizine (ZYRTEC ALLERGY) 10 MG tablet    Sig: Take 1 tablet (10 mg total) by mouth daily.    Dispense:  30 tablet    Refill:  0  . benzonatate (TESSALON) 100 MG capsule    Sig: Take 1 capsule (100 mg total) by mouth 3 (three) times daily as needed for cough.    Dispense:  30 capsule    Refill:  0  Discharge instructions  Strep test is negative.  Sample will be sent for culture and someone will call if your result is abnormal.  COVID-19, flu A/B testing ordered.  It will take between 2-7 days for test results.  Someone will contact you regarding abnormal results.    In the meantime: You should remain isolated in your home for 10 days from symptom onset AND greater than 24 hours after symptoms resolution (absence of fever without the use of  fever-reducing medication and improvement in respiratory symptoms), whichever is longer Get plenty of rest and push fluids Tessalon Perles prescribed for cough Zyrtec for nasal congestion, runny nose, and/or sore throat Flonase for nasal congestion and runny nose Prednisone was prescribed Use medications daily for symptom relief Use OTC medications like ibuprofen or tylenol as needed fever or pain Call or go to the ED if you have any new or worsening symptoms such as fever, worsening cough, shortness of breath, chest tightness, chest pain, turning blue, changes in mental status, etc...   Reviewed expectations re: course of current medical issues. Questions answered. Outlined signs and symptoms indicating need for more acute intervention. Patient verbalized understanding. After Visit Summary given.         Emerson Monte, [place] 10/30/20 1223

## 2020-10-31 LAB — COVID-19, FLU A+B NAA
Influenza A, NAA: NOT DETECTED
Influenza B, NAA: NOT DETECTED
SARS-CoV-2, NAA: NOT DETECTED

## 2020-11-02 LAB — CULTURE, GROUP A STREP (THRC)

## 2020-12-09 ENCOUNTER — Ambulatory Visit: Payer: Medicare Other | Admitting: Psychiatry

## 2020-12-23 ENCOUNTER — Telehealth: Payer: Self-pay | Admitting: Psychiatry

## 2020-12-23 DIAGNOSIS — F908 Attention-deficit hyperactivity disorder, other type: Secondary | ICD-10-CM

## 2020-12-23 MED ORDER — METHYLPHENIDATE HCL ER 18 MG PO TB24: 18.0000 mg | ORAL_TABLET | Freq: Every day | ORAL | 0 refills | Status: DC

## 2020-12-23 NOTE — Telephone Encounter (Signed)
sent 

## 2020-12-23 NOTE — Telephone Encounter (Signed)
Pt would like a refill on Methylphenidate 18mg . Please send to [facility] in [place].

## 2020-12-24 ENCOUNTER — Ambulatory Visit: Payer: Medicare Other

## 2020-12-25 ENCOUNTER — Telehealth: Payer: Self-pay

## 2020-12-25 NOTE — Telephone Encounter (Signed)
noted 

## 2020-12-25 NOTE — Telephone Encounter (Signed)
Let me review.

## 2020-12-25 NOTE — Telephone Encounter (Signed)
Teresa Tran this pt hasn't been seen 01/22/2020 . [facility] sent a "need for clarification" for Dexilant 60mg  DR CAP, Qty 30. Do you need to see this "need for clarification"?

## 2020-12-29 ENCOUNTER — Encounter: Payer: Self-pay | Admitting: Internal Medicine

## 2021-01-20 ENCOUNTER — Telehealth: Payer: Self-pay | Admitting: Psychiatry

## 2021-01-20 DIAGNOSIS — F908 Attention-deficit hyperactivity disorder, other type: Secondary | ICD-10-CM

## 2021-01-20 NOTE — Telephone Encounter (Signed)
Pt left a message about needing a refill on her concerta 18 mg to be sent to the [facility] in [place]. Next appt 3/25

## 2021-01-21 MED ORDER — METHYLPHENIDATE HCL ER 18 MG PO TB24: 18.0000 mg | ORAL_TABLET | Freq: Every day | ORAL | 0 refills | Status: DC

## 2021-01-21 NOTE — Telephone Encounter (Signed)
Script sent  

## 2021-01-26 ENCOUNTER — Ambulatory Visit: Payer: Medicare Other | Admitting: Psychiatry

## 2021-01-26 ENCOUNTER — Other Ambulatory Visit: Payer: Self-pay

## 2021-01-28 ENCOUNTER — Other Ambulatory Visit (HOSPITAL_COMMUNITY): Payer: Self-pay | Admitting: Adult Health

## 2021-01-28 DIAGNOSIS — Z1231 Encounter for screening mammogram for malignant neoplasm of breast: Secondary | ICD-10-CM

## 2021-01-29 ENCOUNTER — Ambulatory Visit: Payer: Medicare Other | Admitting: Psychiatry

## 2021-02-09 ENCOUNTER — Encounter: Payer: Self-pay | Admitting: Psychiatry

## 2021-02-09 ENCOUNTER — Other Ambulatory Visit: Payer: Self-pay

## 2021-02-09 ENCOUNTER — Ambulatory Visit (INDEPENDENT_AMBULATORY_CARE_PROVIDER_SITE_OTHER): Payer: Medicare Other | Admitting: Psychiatry

## 2021-02-09 DIAGNOSIS — F908 Attention-deficit hyperactivity disorder, other type: Secondary | ICD-10-CM | POA: Diagnosis not present

## 2021-02-09 MED ORDER — METHYLPHENIDATE HCL ER (OSM) 18 MG PO TBCR: 18.0000 mg | EXTENDED_RELEASE_TABLET | Freq: Every day | ORAL | 0 refills | Status: DC

## 2021-02-09 MED ORDER — METHYLPHENIDATE HCL ER 18 MG PO TB24: 18.0000 mg | ORAL_TABLET | Freq: Every day | ORAL | 0 refills | Status: DC

## 2021-02-09 NOTE — Progress Notes (Signed)
Teresa Tran [id number] Sep 22, 1981 40 y.o.  Subjective:   Patient ID:  Teresa Tran is a 40 y.o. (DOB [birth year]) female.  Chief Complaint:  Chief Complaint  Patient presents with  . ADHD  . Follow-up    H/o Depression and anxiety    HPI Teresa Tran presents to the office today for follow-up of ADHD and h/o mood and anxiety. She reports that she has been doing well. She reports that concentration has been ok. She reports that she would like to eventually come off Concerta. Mood has "good." She reports that she has not had any significant mood s/s around menses. Denies anxiety. Sleeping ok. She reports that she has gained some weight. Energy and motivation have been good. Denies SI.   She and her children are going to [place] for 3 weeks on a mission trip this summer. 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 Family is doing well. Son turned 36 yo two weeks ago. He is completing home school and will be doing school online. Daughter is 68 yo.   Past Psychiatric Medication Trials: Rexulti- increased HA's, drowsiness Cymbalta Buspar Cerefolin Topamax Prozac Celexa Lexapro Xanax Ativan Lithium-tremors Lamictal risperdal Seroquel Wellbutrin- Ineffective, HA Phentermine Gabapentin- drowsiness Methylphenidate-Effective for concentration in the morning Concerta- Had increased jitteriness and HR on 36 mg. Propranolol- caused dizziness  GAD-7   Flowsheet Row Office Visit from 12/17/2019 in [place]  Total GAD-7 Score 0    PHQ2-9   [place] Office Visit from 12/25/2019 in [facility] [facility] from 12/21/2019 in [place] Visit from 12/17/2019 in [facility] Visit from 09/14/2019 in [facility] Office Visit from 06/06/2019 in [facility]  PHQ-2 Total Score 0 0 0 0 0  PHQ-9 Total  Score -- 0 0 -- --       Review of Systems:  Review of Systems  Cardiovascular: Negative for palpitations.  Musculoskeletal: Negative for gait problem.  Neurological: Negative for tremors.  Psychiatric/Behavioral:       Please refer to HPI   She reports that alignments are helpful for her pain.    Medications: I have reviewed the patient's current medications.  Current Outpatient Medications  Medication Sig Dispense Refill  . acetaminophen (TYLENOL) 500 MG tablet Take 1,000 mg by mouth every 6 (six) hours as needed for mild pain or headache.    . albuterol (PROVENTIL HFA;VENTOLIN HFA) 108 (90 Base) MCG/ACT inhaler Inhale 2 puffs into the lungs every 4 (four) hours as needed for wheezing or shortness of breath. 1 Inhaler 2  . chlorthalidone (HYGROTON) 25 MG tablet Take 1 tablet (25 mg total) by mouth daily. 90 tablet 3  . cholecalciferol (VITAMIN D3) 25 MCG (1000 UNIT) tablet Take 1,000 Units by mouth daily.    Marland Kitchen dexlansoprazole (DEXILANT) 60 MG capsule Take 1 capsule (60 mg total) by mouth daily before breakfast. 30 capsule 11  . Multiple Vitamins-Minerals (IMMUNE SUPPORT PO) Take by mouth.    . nystatin (MYCOSTATIN/NYSTOP) powder Apply 1 application topically 3 (three) times daily. (Patient taking differently: Apply 1 application topically as needed.) 30 g 3  . benzonatate (TESSALON) 100 MG capsule Take 1 capsule (100 mg total) by mouth 3 (three) times daily as needed for cough. (Patient not taking: Reported on 02/09/2021) 30 capsule 0  . cetirizine (ZYRTEC ALLERGY) 10 MG tablet Take 1 tablet (10  mg total) by mouth daily. (Patient not taking: Reported on 02/09/2021) 30 tablet 0  . doxycycline (VIBRA-TABS) 100 MG tablet Take 1 tablet (100 mg total) by mouth 2 (two) times daily. 1 po bid (Patient not taking: No sig reported) 28 tablet 0  . fluconazole (DIFLUCAN) 150 MG tablet Take 1 tablet by mouth now and repeat in 5 days if symptoms persist (Patient not taking: No sig reported) 2 tablet 0   . [START ON 04/15/2021] methylphenidate 18 MG PO CR tablet Take 1 tablet (18 mg total) by mouth daily. 30 tablet 0  . [START ON 03/18/2021] methylphenidate 18 MG PO CR tablet Take 1 tablet (18 mg total) by mouth daily. 30 tablet 0  . [START ON 02/18/2021] methylphenidate 18 MG PO CR tablet Take 1 tablet (18 mg total) by mouth daily. 30 tablet 0  . predniSONE (DELTASONE) 10 MG tablet Take 2 tablets (20 mg total) by mouth daily. (Patient not taking: Reported on 02/09/2021) 15 tablet 0   No current facility-administered medications for this visit.    Medication Side Effects: None  Allergies:  Allergies  Allergen Reactions  . Macrobid [Nitrofurantoin Monohyd Macro] Itching    Past Medical History:  Diagnosis Date  . Allergy    medicine  . Anemia   . Anxiety   . Bipolar disorder ([place])   . BV (bacterial vaginosis) 08/15/2014  . CSF leak   . Depression   . GERD (gastroesophageal reflux disease)   . Headache(784.0)   . Hypertension   . Low back pain 08/15/2014  . Lupus ([place])   . Obesity   . Ulcer   . Urinary frequency 08/30/2014  . Vaginal discharge 08/15/2014  . Vaginal irritation 02/12/2014  . Yeast infection 02/12/2014    Family History  Problem Relation Age of Onset  . Depression Father   . Anxiety disorder Father   . Diabetes Father   . Hypertension Father   . Hyperlipidemia Father   . Kidney disease Father   . Mental illness Father   . Pneumonia Father   . Stroke Father   . Depression Sister   . Anxiety disorder Sister   . Cancer Sister        cervical  . Stroke Sister   . Depression Brother   . Anxiety disorder Brother   . Cancer - Other Paternal Grandfather           . Cancer Paternal Grandfather        prostate  . Kidney disease Mother   . Diabetes Mother   . Cancer Mother        kidney  . Hyperlipidemia Mother   . Hypertension Mother   . Stroke Mother   . Aneurysm Maternal Grandfather   . Cancer Maternal Grandmother 3       breast  . Cancer Paternal  Grandmother        bladder  . ADD / ADHD Neg Hx   . Alcohol abuse Neg Hx   . Drug abuse Neg Hx   . Bipolar disorder Neg Hx   . Dementia Neg Hx   . OCD Neg Hx   . Paranoid behavior Neg Hx   . Schizophrenia Neg Hx   . Seizures Neg Hx   . Sexual abuse Neg Hx   . Physical abuse Neg Hx   . Colon cancer Neg Hx     Social History   Socioeconomic History  . Marital status: Married    Spouse name: Nathaneil Canary  .  Number of children: 2  . Years of education: 16  . Highest education level: Bachelor's degree (e.g., BA, AB, BS)  Occupational History  . Occupation: homehealth  Tobacco Use  . Smoking status: Never Smoker  . Smokeless tobacco: Never Used  Vaping Use  . Vaping Use: Never used  Substance and Sexual Activity  . Alcohol use: No    Alcohol/week: 0.0 standard drinks  . Drug use: No  . Sexual activity: Yes    Birth control/protection: Surgical    Comment: tubal  Other Topics Concern  . 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   Please see review of systems for further details on the patient's review from today.   Objective:   Physical Exam:  BP 129/89   Pulse 93   Physical Exam Constitutional:      General: She is not in acute distress. Musculoskeletal:        General: No deformity.  Neurological:     Mental Status: She is alert and oriented to person, place, and time.     Coordination: Coordination normal.  Psychiatric:        Attention and Perception: Attention and  perception normal. She does not perceive auditory or visual hallucinations.        Mood and Affect: Mood normal. Mood is not anxious or depressed. Affect is not labile, blunt, angry or inappropriate.        Speech: Speech normal.        Behavior: Behavior normal.        Thought Content: Thought content normal. Thought content is not paranoid or delusional. Thought content does not include homicidal or suicidal ideation. Thought content does not include homicidal or suicidal plan.        Cognition and Memory: Cognition and memory normal.        Judgment: Judgment normal.     Comments: Insight intact     Lab Review:     Component Value Date/Time   NA 142 12/17/2019 1039   K 3.3 (L) 12/17/2019 1039   CL 100 12/17/2019 1039   CO2 19 (L) 12/17/2019 1039   GLUCOSE 89 12/17/2019 1039   GLUCOSE 102 (H) 08/22/2018 1235   BUN 10 12/17/2019 1039   CREATININE 0.79 12/17/2019 1039   CREATININE 0.64 09/01/2017 1635   CALCIUM 9.9 12/17/2019 1039   PROT 6.5 08/01/2020 0844   PROT 7.3 12/17/2019 1039   ALBUMIN 4.5 12/17/2019 1039   AST 14 08/01/2020 0844   ALT 10 08/01/2020 0844   ALKPHOS 79 12/17/2019 1039   BILITOT 0.3 08/01/2020 0844   BILITOT 0.5 12/17/2019 1039   GFRNONAA 96 12/17/2019 1039   GFRNONAA 116 09/01/2017 1635   GFRAA 111 12/17/2019 1039   GFRAA 134 09/01/2017 1635       Component Value Date/Time   WBC 6.7 12/17/2019 1039   WBC 7.7 08/22/2018 1235   RBC 5.19 12/17/2019 1039   RBC 4.61 08/22/2018 1235  HGB 13.6 12/17/2019 1039   HCT 40.7 12/17/2019 1039   PLT 210 12/17/2019 1039   MCV 78 (L) 12/17/2019 1039   MCH 26.2 (L) 12/17/2019 1039   MCH 25.8 (L) 08/22/2018 1235   MCHC 33.4 12/17/2019 1039   MCHC 30.7 08/22/2018 1235   RDW 14.7 12/17/2019 1039   LYMPHSABS 1.3 12/17/2019 1039   MONOABS 0.5 10/09/2016 1416   EOSABS 0.0 12/17/2019 1039   BASOSABS 0.1 12/17/2019 1039    Lithium Lvl  Date Value Ref Range Status  08/22/2014 0.80 0.80 - 1.40 mEq/L Final      No results found for: PHENYTOIN, PHENOBARB, VALPROATE, CBMZ   .res Assessment: Plan:   Continue Concerta 18 mg po qd for ADHD.  Discussed sending 90-day script in July since pt will be traveling to [place] for several weeks.  Pt to follow-up in 6 months or sooner if clinically indicated.  Requested pt call in 3 months to provide update and request additional scripts.  Patient advised to contact office with any questions, adverse effects, or acute worsening in signs and symptoms.  [place] was seen today for adhd and follow-up.  Diagnoses and all orders for this visit:  Attention deficit hyperactivity disorder (ADHD), other type -     methylphenidate 18 MG PO CR tablet; Take 1 tablet (18 mg total) by mouth daily. -     methylphenidate 18 MG PO CR tablet; Take 1 tablet (18 mg total) by mouth daily. -     methylphenidate 18 MG PO CR tablet; Take 1 tablet (18 mg total) by mouth daily.     Please see After Visit Summary for patient specific instructions.  Future Appointments  Date Time Provider [place]  02/20/2021  9:00 AM AP-MM 1 AP-MM [place] [facility]  04/03/2021  9:00 AM Mahala Menghini, PA-C R[facility]-RGA R[facility]  08/11/2021  9:30 AM Thayer Headings, PMHNP CP-CP None    No orders of the defined types were placed in this encounter.   -------------------------------

## 2021-02-20 ENCOUNTER — Ambulatory Visit (HOSPITAL_COMMUNITY): Payer: Medicare Other

## 2021-02-23 ENCOUNTER — Other Ambulatory Visit: Payer: Self-pay | Admitting: Family Medicine

## 2021-02-23 ENCOUNTER — Other Ambulatory Visit: Payer: Self-pay | Admitting: Gastroenterology

## 2021-02-25 ENCOUNTER — Telehealth: Payer: Self-pay | Admitting: Internal Medicine

## 2021-02-25 NOTE — Telephone Encounter (Signed)
Patient called and said that she is out of [place] and her pharmacy, [facility] in [place] needs a prior authorizatino to fill it

## 2021-02-26 ENCOUNTER — Other Ambulatory Visit: Payer: Self-pay

## 2021-02-26 MED ORDER — DEXILANT 60 MG PO CPDR: DELAYED_RELEASE_CAPSULE | ORAL | 5 refills | Status: DC

## 2021-03-04 ENCOUNTER — Telehealth: Payer: Self-pay

## 2021-03-04 NOTE — Telephone Encounter (Signed)
Prior Approval received for METHYLPHENIDATE ER 18 MG effective 11/08/2020-11/07/2021 with [facility] PA# [id number]

## 2021-03-12 ENCOUNTER — Encounter: Payer: Self-pay | Admitting: Internal Medicine

## 2021-03-13 ENCOUNTER — Ambulatory Visit (INDEPENDENT_AMBULATORY_CARE_PROVIDER_SITE_OTHER): Payer: Medicare Other

## 2021-03-13 ENCOUNTER — Other Ambulatory Visit: Payer: Self-pay

## 2021-03-13 ENCOUNTER — Ambulatory Visit
Admission: EM | Admit: 2021-03-13 | Discharge: 2021-03-13 | Disposition: A | Discharge status: Home / Self Care | Payer: Medicare Other | Attending: Emergency Medicine | Admitting: Emergency Medicine

## 2021-03-13 DIAGNOSIS — Z8616 Personal history of COVID-19: Secondary | ICD-10-CM

## 2021-03-13 DIAGNOSIS — R0989 Other specified symptoms and signs involving the circulatory and respiratory systems: Secondary | ICD-10-CM

## 2021-03-13 DIAGNOSIS — J069 Acute upper respiratory infection, unspecified: Secondary | ICD-10-CM | POA: Diagnosis not present

## 2021-03-13 DIAGNOSIS — R059 Cough, unspecified: Secondary | ICD-10-CM | POA: Diagnosis not present

## 2021-03-13 MED ORDER — AEROCHAMBER PLUS MISC: 2 refills | Status: DC

## 2021-03-13 MED ORDER — BENZONATATE 200 MG PO CAPS: 200.0000 mg | ORAL_CAPSULE | Freq: Three times a day (TID) | ORAL | 0 refills | Status: DC | PRN

## 2021-03-13 MED ORDER — FLUTICASONE PROPIONATE 50 MCG/ACT NA SUSP
2.0000 | Freq: Every day | NASAL | 0 refills | Status: DC
Start: 2021-03-13 — End: 2021-10-06

## 2021-03-13 NOTE — Discharge Instructions (Addendum)
2 puffs from your albuterol inhaler every 4 hours for 2 days, then every 6 hours for 2 days, then as needed.  Use your spacer.  Your chest x-ray was normal.  Start saline nasal irrigation with a Milta Deiters Med rinse and distilled water as often as you want, Flonase, Mucinex.  Tessalon for the cough.

## 2021-03-13 NOTE — ED Triage Notes (Signed)
Pt presents with c/o cough and chest congestion since Monday, took home covid test and was negative . Also having left ear pain

## 2021-03-13 NOTE — ED Provider Notes (Signed)
HPI  SUBJECTIVE:  Teresa Tran is a 40 y.o. female who presents with 5 days of nasal congestion, sneezing, cough, sore throat, left ear pain, malaise, headache.  She reports chest congestion and cough starting yesterday.  She reports watery eyes, but no sneezing.  She reports fevers, postnasal drip and rhinorrhea earlier in the week.  She also reports nonradiating, burning central chest pain that is present with coughing only.  It is not present at any other time.  She had a negative home COVID test.  No body aches, sinus pain or pressure, wheezing, shortness of breath, nausea, vomiting, diarrhea, abdominal pain.  No waterbrash, belching.  No known COVID exposure.  She got the second dose of the Moderna vaccine last year.  No antibiotics in the past month.  No antipyretic in the past 6 hours.  She has a history of reactive airway disease and uses albuterol, but has not needed it over the past week.  She tried cold and flu medication with temporary relief.  No aggravating factors.  She has a past medical history of COVID in February 22, GERD, lupus, hypertension, sinusitis.  No history of allergies, pulmonary disease, smoking, diabetes.  LMP: 4/24.  Denies possibility being pregnant.  [id number], Fransisca Kaufmann, MD    Past Medical History:  Diagnosis Date  . Allergy    medicine  . Anemia   . Anxiety   . Bipolar disorder ([place])   . BV (bacterial vaginosis) 08/15/2014  . CSF leak   . Depression   . GERD (gastroesophageal reflux disease)   . Headache(784.0)   . Hypertension   . Low back pain 08/15/2014  . Lupus ([place])   . Obesity   . Ulcer   . Urinary frequency 08/30/2014  . Vaginal discharge 08/15/2014  . Vaginal irritation 02/12/2014  . Yeast infection 02/12/2014    Past Surgical History:  Procedure Laterality Date  . CHOLECYSTECTOMY  Feb 2015   Dr. Ladona Horns, [place]  . ENDOSCOPIC CONCHA BULLOSA RESECTION Left 07/20/2016   Procedure: ENDOSCOPIC LEFT  CONCHA BULLOSA RESECTION;  Surgeon: Leta Baptist,  MD;  Location: [facility];  Service: ENT;  Laterality: Left;  . 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 ESOPHAGOGASTRODUODENOSCOPY N/A 05/06/2015   Procedure: ESOPHAGOGASTRODUODENOSCOPY (EGD);  Surgeon: Danie Binder, MD;  [facility];  Service: Endoscopy;  Laterality: N/A;  1300 - moved to 1:15 - office to notify  . ESOPHAGOGASTRODUODENOSCOPY (EGD) WITH ESOPHAGEAL DILATION N/A 02/11/2014   Dr. 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 EXCISION CHONCHA BULLOSA N/A 07/20/2016   Procedure: NASAL SEPTOPLASTY WITH BILATERAL TURBINATE REDUCTION;  Surgeon: Leta Baptist, MD;  Location: [facility];  Service: ENT;  Laterality: N/A;  . RHINOPLASTY    . SAVORY DILATION N/A 05/06/2015   Procedure: SAVORY DILATION;  Surgeon: Danie Binder, MD;  [facility];  Service: Endoscopy;  Laterality: N/A;  . TUBAL LIGATION      Family History  Problem Relation Age of Onset  . Depression Father   . Anxiety disorder Father   . Diabetes Father   . Hypertension Father   . Hyperlipidemia Father   . Kidney disease Father   . Mental illness Father   . Pneumonia Father   . Stroke Father   . Depression Sister   . Anxiety disorder Sister   . Cancer Sister        cervical  .  Stroke Sister   . Depression Brother   . Anxiety disorder Brother   . Cancer - Other Paternal Grandfather           . Cancer Paternal Grandfather        prostate  . Kidney disease Mother   . Diabetes Mother   . Cancer Mother        kidney  . Hyperlipidemia Mother   . Hypertension Mother   . Stroke Mother   . Aneurysm Maternal Grandfather   . Cancer Maternal Grandmother 110       breast  . Cancer Paternal Grandmother        bladder  . ADD / ADHD Neg Hx   . Alcohol abuse Neg Hx   . Drug abuse Neg Hx   . Bipolar disorder Neg Hx   . Dementia Neg Hx   . OCD Neg  Hx   . Paranoid behavior Neg Hx   . Schizophrenia Neg Hx   . Seizures Neg Hx   . Sexual abuse Neg Hx   . Physical abuse Neg Hx   . Colon cancer Neg Hx     Social History   Tobacco Use  . Smoking status: Never Smoker  . Smokeless tobacco: Never Used  Vaping Use  . Vaping Use: Never used  Substance Use Topics  . Alcohol use: No    Alcohol/week: 0.0 standard drinks  . Drug use: No    No current facility-administered medications for this encounter.  Current Outpatient Medications:  .  benzonatate (TESSALON) 200 MG capsule, Take 1 capsule (200 mg total) by mouth 3 (three) times daily as needed for cough., Disp: 30 capsule, Rfl: 0 .  fluticasone (FLONASE) 50 MCG/ACT nasal spray, Place 2 sprays into both nostrils daily., Disp: 16 g, Rfl: 0 .  Spacer/Aero-Holding Chambers (AEROCHAMBER PLUS) inhaler, Use with inhaler, Disp: 1 each, Rfl: 2 .  acetaminophen (TYLENOL) 500 MG tablet, Take 1,000 mg by mouth every 6 (six) hours as needed for mild pain or headache., Disp: , Rfl:  .  albuterol (PROVENTIL HFA;VENTOLIN HFA) 108 (90 Base) MCG/ACT inhaler, Inhale 2 puffs into the lungs every 4 (four) hours as needed for wheezing or shortness of breath., Disp: 1 Inhaler, Rfl: 2 .  chlorthalidone (HYGROTON) 25 MG tablet, Take 1 tablet (25 mg total) by mouth daily. (NEEDS TO BE SEEN BEFORE NEXT REFILL), Disp: 30 tablet, Rfl: 0 .  cholecalciferol (VITAMIN D3) 25 MCG (1000 UNIT) tablet, Take 1,000 Units by mouth daily., Disp: , Rfl:  .  DEXILANT 60 MG capsule, TAKE 1 CAPSULE BY MOUTH ONCE DAILY BEFORE BREAKFAST, Disp: 30 capsule, Rfl: 5 .  doxycycline (VIBRA-TABS) 100 MG tablet, Take 1 tablet (100 mg total) by mouth 2 (two) times daily. 1 po bid (Patient not taking: No sig reported), Disp: 28 tablet, Rfl: 0 .  fluconazole (DIFLUCAN) 150 MG tablet, Take 1 tablet by mouth now and repeat in 5 days if symptoms persist (Patient not taking: No sig reported), Disp: 2 tablet, Rfl: 0 .  [START ON 04/15/2021]  methylphenidate 18 MG PO CR tablet, Take 1 tablet (18 mg total) by mouth daily., Disp: 30 tablet, Rfl: 0 .  [START ON 03/18/2021] methylphenidate 18 MG PO CR tablet, Take 1 tablet (18 mg total) by mouth daily., Disp: 30 tablet, Rfl: 0 .  methylphenidate 18 MG PO CR tablet, Take 1 tablet (18 mg total) by mouth daily., Disp: 30 tablet, Rfl: 0 .  Multiple Vitamins-Minerals (IMMUNE SUPPORT PO), Take by  mouth., Disp: , Rfl:  .  nystatin (MYCOSTATIN/NYSTOP) powder, Apply 1 application topically 3 (three) times daily. (Patient taking differently: Apply 1 application topically as needed.), Disp: 30 g, Rfl: 3  Allergies  Allergen Reactions  . Macrobid [Nitrofurantoin Monohyd Macro] Itching     ROS  As noted in HPI.   Physical Exam  BP 121/80   Pulse 97   Temp 99.3 F (37.4 C)   Resp 18   LMP 03/01/2021   SpO2 95%   Constitutional: Well developed, well nourished, no acute distress Eyes:  EOMI, conjunctiva normal bilaterally HENT: Normocephalic, atraumatic,mucus membranes moist.  Left TM normal.  positive nasal congestion. normal turbinates. No maxillary, frontal sinus tenderness. No PND Respiratory: Normal inspiratory effort, lungs clear b/l. Good air movement. + anterior chest wall tenderness.  Cardiovascular: Normal rate, regular rhythm no m/r/g GI: nondistended skin: No rash, skin intact Musculoskeletal: no deformities Neurologic: Alert & oriented x 3, no focal neuro deficits Psychiatric: Speech and behavior appropriate   ED Course   Medications - No data to display  Orders Placed This Encounter  Procedures  . DG Chest 2 View    Standing Status:   Standing    Number of Occurrences:   1    Order Specific Question:   Reason for Exam (SYMPTOM  OR DIAGNOSIS REQUIRED)    Answer:   cough chest congestion h/o COVID r/o PNA    No results found for this or any previous visit (from the past 24 hour(s)). DG Chest 2 View  Result Date: 03/13/2021 CLINICAL DATA:  Cough. Chest  congestion. History of COVID. Rule out pneumonia. Cough x5 days. EXAM: CHEST - 2 VIEW COMPARISON:  Prior chest radiographs 08/22/2018 and earlier. FINDINGS: Heart size within normal limits. No appreciable airspace consolidation. No evidence of pleural effusion or pneumothorax. No acute bony abnormality identified. Surgical clips within the right upper quadrant of the abdomen. IMPRESSION: No evidence of active cardiopulmonary disease. Electronically Signed   By: Kellie Simmering DO   On: 03/13/2021 10:40    ED Clinical Impression  1. Acute upper respiratory infection      ED Assessment/Plan  Will check CXR due to recent COVID with resulting scarring in lungs per pt and O2 sat 95%.   Reviewed imaging independently.  Normal chest x-ray. see radiology report for full details.  Presentation c/w URI.  Chest x-ray normal.  Decided to not test for covid given infection 2.5 months ago.  Home with Mucinex, Tylenol, Flonase, a spacer for her albuterol inhaler.  Follow-up with PMD as needed.  Discussed labs, imaging, MDM, treatment plan, and plan for follow-up with patient. Discussed sn/sx that should prompt return to the ED. patient agrees with plan.   Meds ordered this encounter  Medications  . fluticasone (FLONASE) 50 MCG/ACT nasal spray    Sig: Place 2 sprays into both nostrils daily.    Dispense:  16 g    Refill:  0  . Spacer/Aero-Holding Chambers (AEROCHAMBER PLUS) inhaler    Sig: Use with inhaler    Dispense:  1 each    Refill:  2    Please educate patient on use  . benzonatate (TESSALON) 200 MG capsule    Sig: Take 1 capsule (200 mg total) by mouth 3 (three) times daily as needed for cough.    Dispense:  30 capsule    Refill:  0      *This clinic note was created using [employer]. Therefore, there may be occasional mistakes  despite careful proofreading.  ?    Melynda Ripple, MD 03/14/21 [phone]

## 2021-03-28 ENCOUNTER — Other Ambulatory Visit: Payer: Self-pay | Admitting: Family Medicine

## 2021-03-30 NOTE — Telephone Encounter (Signed)
Dettinger NTBS 30 days given 02/23/21 & last OV 03/27/20

## 2021-04-03 ENCOUNTER — Ambulatory Visit: Payer: Medicare Other | Admitting: Gastroenterology

## 2021-04-21 DIAGNOSIS — R9431 Abnormal electrocardiogram [ECG] [EKG]: Secondary | ICD-10-CM | POA: Diagnosis not present

## 2021-04-21 DIAGNOSIS — R1013 Epigastric pain: Secondary | ICD-10-CM | POA: Diagnosis not present

## 2021-04-21 DIAGNOSIS — R0789 Other chest pain: Secondary | ICD-10-CM | POA: Diagnosis not present

## 2021-04-21 DIAGNOSIS — Z20822 Contact with and (suspected) exposure to covid-19: Secondary | ICD-10-CM | POA: Diagnosis not present

## 2021-04-21 DIAGNOSIS — I10 Essential (primary) hypertension: Secondary | ICD-10-CM | POA: Diagnosis not present

## 2021-04-21 DIAGNOSIS — R079 Chest pain, unspecified: Secondary | ICD-10-CM | POA: Diagnosis not present

## 2021-05-26 DIAGNOSIS — R7303 Prediabetes: Secondary | ICD-10-CM | POA: Diagnosis not present

## 2021-05-26 DIAGNOSIS — E669 Obesity, unspecified: Secondary | ICD-10-CM | POA: Diagnosis not present

## 2021-05-26 DIAGNOSIS — E7801 Familial hypercholesterolemia: Secondary | ICD-10-CM | POA: Diagnosis not present

## 2021-05-26 DIAGNOSIS — K760 Fatty (change of) liver, not elsewhere classified: Secondary | ICD-10-CM | POA: Diagnosis not present

## 2021-05-26 DIAGNOSIS — E349 Endocrine disorder, unspecified: Secondary | ICD-10-CM | POA: Diagnosis not present

## 2021-05-26 DIAGNOSIS — I10 Essential (primary) hypertension: Secondary | ICD-10-CM | POA: Diagnosis not present

## 2021-05-26 DIAGNOSIS — Z6841 Body Mass Index (BMI) 40.0 and over, adult: Secondary | ICD-10-CM | POA: Diagnosis not present

## 2021-06-01 MED ORDER — METHYLPHENIDATE HCL ER 18 MG PO TB24: 18.0000 mg | ORAL_TABLET | Freq: Every day | ORAL | 0 refills | Status: DC

## 2021-06-01 MED ORDER — METHYLPHENIDATE HCL ER (OSM) 18 MG PO TBCR: 18.0000 mg | EXTENDED_RELEASE_TABLET | Freq: Every day | ORAL | 0 refills | Status: DC

## 2021-07-01 ENCOUNTER — Ambulatory Visit: Payer: Medicare Other | Admitting: Gastroenterology

## 2021-08-06 ENCOUNTER — Telehealth: Payer: Self-pay | Admitting: Psychiatry

## 2021-08-06 ENCOUNTER — Other Ambulatory Visit: Payer: Self-pay

## 2021-08-06 DIAGNOSIS — F908 Attention-deficit hyperactivity disorder, other type: Secondary | ICD-10-CM

## 2021-08-06 MED ORDER — METHYLPHENIDATE HCL ER 18 MG PO TB24: 18.0000 mg | ORAL_TABLET | Freq: Every day | ORAL | 0 refills | Status: DC

## 2021-08-06 NOTE — Telephone Encounter (Signed)
Pt has an appt on 10/4. She needs a refill on her methylphenidate 18 mg to be sent to the [facility] in [place]

## 2021-08-06 NOTE — Telephone Encounter (Signed)
Pended.

## 2021-08-11 ENCOUNTER — Ambulatory Visit: Payer: Medicare Other | Admitting: Psychiatry

## 2021-08-16 DIAGNOSIS — Z23 Encounter for immunization: Secondary | ICD-10-CM | POA: Diagnosis not present

## 2021-08-26 ENCOUNTER — Encounter: Payer: Self-pay | Admitting: Psychiatry

## 2021-08-26 ENCOUNTER — Ambulatory Visit (INDEPENDENT_AMBULATORY_CARE_PROVIDER_SITE_OTHER): Payer: Medicare Other | Admitting: Psychiatry

## 2021-08-26 ENCOUNTER — Other Ambulatory Visit: Payer: Self-pay

## 2021-08-26 ENCOUNTER — Ambulatory Visit: Payer: Medicare Other | Admitting: Psychiatry

## 2021-08-26 VITALS — BP 132/81 | HR 77

## 2021-08-26 DIAGNOSIS — F908 Attention-deficit hyperactivity disorder, other type: Secondary | ICD-10-CM | POA: Diagnosis not present

## 2021-08-26 NOTE — Progress Notes (Signed)
Teresa Tran [id number] October 28, 1981 40 y.o.  Subjective:   Patient ID:  Teresa Tran is a 40 y.o. (DOB [birth year]) female.  Chief Complaint:  Chief Complaint  Patient presents with   Follow-up    ADD    HPI Teresa Tran presents to the office today for follow-up of ADD. 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 She reports that she uses lists to track her ideas and tries to finish something before she moves on to the next task.   She reports that she "notices a difference when I don't take" Concerta. She reports that she will be fatigued, her head will hurt, and she cannot focus. She reports that she is easily distracted without medication. Typically takes Concerta around 9:44-9 am and that it will last throughout the day.   Mood has been stable. Denies any recent severe changes in mood around her menstrual cycle. Denies any recent anxiety. Reports that she is now able to do things that would have previously caused severe anxiety, such as public speaking. Sleeping well. Appetite has been good. She reports that she has gained some weight. Denies SI.   Will be going on mission trip in March. Non-profit is going well.   [place] Office Visit from 12/17/2019 in [place]  Total GAD-7 Score 0      PHQ2-9    [place] Office Visit from 12/25/2019 in [place] from 12/21/2019 in [place] Visit from 12/17/2019 in [place] Visit from 09/14/2019 in [place] Office Visit from 06/06/2019 in [place]  PHQ-2 Total Score 0 0 0 0 0  PHQ-9 Total Score -- 0 0 -- --      Flowsheet Row ED from 03/13/2021 in [facility] [place] No Risk        Review of Systems:  Review of  Systems  Cardiovascular:  Negative for palpitations.  Musculoskeletal:  Negative for gait problem.  Psychiatric/Behavioral:         Please refer to HPI   Medications: I have reviewed the patient's current medications.  Current Outpatient Medications  Medication Sig Dispense Refill   acetaminophen (TYLENOL) 500 MG tablet Take 1,000 mg by mouth every 6 (six) hours as needed for mild pain or headache.     chlorthalidone (HYGROTON) 25 MG tablet Take 1 tablet (25 mg total) by mouth daily. (NEEDS TO BE SEEN BEFORE NEXT REFILL) 30 tablet 0   cholecalciferol (VITAMIN D3) 25 MCG (1000 UNIT) tablet Take 1,000 Units by mouth daily.     DEXILANT 60 MG capsule TAKE 1 CAPSULE BY MOUTH ONCE DAILY BEFORE BREAKFAST 30 capsule 5   methylphenidate 18 MG PO CR tablet Take 1 tablet (18 mg total) by mouth daily. 30 tablet 0   Multiple Vitamins-Minerals (IMMUNE SUPPORT PO) Take by mouth.     albuterol (PROVENTIL HFA;VENTOLIN HFA) 108 (90 Base) MCG/ACT inhaler Inhale 2 puffs into the lungs every 4 (four) hours as needed for wheezing or shortness of breath. 1 Inhaler 2   doxycycline (VIBRA-TABS) 100 MG tablet Take 1 tablet (100 mg total) by mouth 2 (two) times daily. 1 po bid (Patient not taking: No sig reported) 28 tablet 0   fluconazole (DIFLUCAN) 150 MG tablet Take 1  tablet by mouth now and repeat in 5 days if symptoms persist (Patient not taking: No sig reported) 2 tablet 0   fluticasone (FLONASE) 50 MCG/ACT nasal spray Place 2 sprays into both nostrils daily. (Patient not taking: Reported on 08/26/2021) 16 g 0   methylphenidate 18 MG PO CR tablet Take 1 tablet (18 mg total) by mouth daily. 30 tablet 0   methylphenidate 18 MG PO CR tablet Take 1 tablet (18 mg total) by mouth daily. 30 tablet 0   nystatin (MYCOSTATIN/NYSTOP) powder Apply 1 application topically 3 (three) times daily. (Patient taking differently: Apply 1 application topically as needed.) 30 g 3   Spacer/Aero-Holding Chambers (AEROCHAMBER PLUS)  inhaler Use with inhaler 1 each 2   No current facility-administered medications for this visit.    Medication Side Effects: Other: Notices some stimulant withdrawal s/s with missed doses  Allergies:  Allergies  Allergen Reactions   Macrobid [Nitrofurantoin Monohyd Macro] Itching    Past Medical History:  Diagnosis Date   Allergy    medicine   Anemia    Anxiety    Bipolar disorder (HCC)    BV (bacterial vaginosis) 08/15/2014   CSF leak    Depression    GERD (gastroesophageal reflux disease)    Headache(784.0)    Hypertension    Low back pain 08/15/2014   Lupus ([place])    Obesity    Ulcer    Urinary frequency 08/30/2014   Vaginal discharge 08/15/2014   Vaginal irritation 02/12/2014   Yeast infection 02/12/2014    Past Medical History, Surgical history, Social history, and Family history were reviewed and updated as appropriate.   Please see review of systems for further details on the patient's review from today.   Objective:   Physical Exam:  BP 132/81   Pulse 77   Physical Exam Constitutional:      General: She is not in acute distress. Musculoskeletal:        General: No deformity.  Neurological:     Mental Status: She is alert and oriented to person, place, and time.     Coordination: Coordination normal.  Psychiatric:        Attention and Perception: Attention and perception normal. She does not perceive auditory or visual hallucinations.        Mood and Affect: Mood normal. Mood is not anxious or depressed. Affect is not labile, blunt, angry or inappropriate.        Speech: Speech normal.        Behavior: Behavior normal.        Thought Content: Thought content normal. Thought content is not paranoid or delusional. Thought content does not include homicidal or suicidal ideation. Thought content does not include homicidal or suicidal plan.        Cognition and Memory: Cognition and memory normal.        Judgment: Judgment normal.     Comments: Insight intact     Lab Review:     Component Value Date/Time   NA 142 12/17/2019 1039   K 3.3 (L) 12/17/2019 1039   CL 100 12/17/2019 1039   CO2 19 (L) 12/17/2019 1039   GLUCOSE 89 12/17/2019 1039   GLUCOSE 102 (H) 08/22/2018 1235   BUN 10 12/17/2019 1039   CREATININE 0.79 12/17/2019 1039   CREATININE 0.64 09/01/2017 1635   CALCIUM 9.9 12/17/2019 1039   PROT 6.5 08/01/2020 0844   PROT 7.3 12/17/2019 1039   ALBUMIN 4.5 12/17/2019 1039   AST 14 08/01/2020  0844   ALT 10 08/01/2020 0844   ALKPHOS 79 12/17/2019 1039   BILITOT 0.3 08/01/2020 0844   BILITOT 0.5 12/17/2019 1039   GFRNONAA 96 12/17/2019 1039   GFRNONAA 116 09/01/2017 1635   GFRAA 111 12/17/2019 1039   GFRAA 134 09/01/2017 1635       Component Value Date/Time   WBC 6.7 12/17/2019 1039   WBC 7.7 08/22/2018 1235   RBC 5.19 12/17/2019 1039   RBC 4.61 08/22/2018 1235   HGB 13.6 12/17/2019 1039   HCT 40.7 12/17/2019 1039   PLT 210 12/17/2019 1039   MCV 78 (L) 12/17/2019 1039   MCH 26.2 (L) 12/17/2019 1039   MCH 25.8 (L) 08/22/2018 1235   MCHC 33.4 12/17/2019 1039   MCHC 30.7 08/22/2018 1235   RDW 14.7 12/17/2019 1039   LYMPHSABS 1.3 12/17/2019 1039   MONOABS 0.5 10/09/2016 1416   EOSABS 0.0 12/17/2019 1039   BASOSABS 0.1 12/17/2019 1039    Lithium Lvl  Date Value Ref Range Status  08/22/2014 0.80 0.80 - 1.40 mEq/L Final     No results found for: PHENYTOIN, PHENOBARB, VALPROATE, CBMZ   .res Assessment: Plan:    Continue Concerta 18 mg po q am for ADHD.  Pt to follow-up in 6 months or sooner if clinically indicated.  Requested pt call in 3 months to provide update and request additional scripts.  Patient advised to contact office with any questions, adverse effects, or acute worsening in signs and symptoms.   Teresa Tran was seen today for follow-up.  Diagnoses and all orders for this visit:  Attention deficit hyperactivity disorder (ADHD), other type    Please see After Visit Summary for patient specific  instructions.  Future Appointments  Date Time Provider [place]  10/06/2021  2:30 PM Estill Dooms, NP [facility]  11/11/2021  3:00 PM Mahala Menghini, PA-C [facility]  03/03/2022 12:45 PM Thayer Headings, PMHNP CP-CP None    No orders of the defined types were placed in this encounter.   -------------------------------

## 2021-09-14 ENCOUNTER — Other Ambulatory Visit: Payer: Self-pay

## 2021-09-14 ENCOUNTER — Telehealth: Payer: Self-pay | Admitting: Psychiatry

## 2021-09-14 DIAGNOSIS — F908 Attention-deficit hyperactivity disorder, other type: Secondary | ICD-10-CM

## 2021-09-14 MED ORDER — METHYLPHENIDATE HCL ER 18 MG PO TB24: 18.0000 mg | ORAL_TABLET | Freq: Every day | ORAL | 0 refills | Status: DC

## 2021-09-14 MED ORDER — METHYLPHENIDATE HCL ER (OSM) 18 MG PO TBCR: 18.0000 mg | EXTENDED_RELEASE_TABLET | Freq: Every day | ORAL | 0 refills | Status: DC

## 2021-09-14 NOTE — Telephone Encounter (Signed)
Pt called and said that she needs refills on her concerta. She is completely out

## 2021-09-14 NOTE — Telephone Encounter (Signed)
Pended.

## 2021-09-16 DIAGNOSIS — H18522 Epithelial (juvenile) corneal dystrophy, left eye: Secondary | ICD-10-CM | POA: Diagnosis not present

## 2021-10-05 DIAGNOSIS — R7303 Prediabetes: Secondary | ICD-10-CM | POA: Diagnosis not present

## 2021-10-05 DIAGNOSIS — Z0000 Encounter for general adult medical examination without abnormal findings: Secondary | ICD-10-CM | POA: Diagnosis not present

## 2021-10-05 DIAGNOSIS — R768 Other specified abnormal immunological findings in serum: Secondary | ICD-10-CM | POA: Diagnosis not present

## 2021-10-05 DIAGNOSIS — R0609 Other forms of dyspnea: Secondary | ICD-10-CM | POA: Diagnosis not present

## 2021-10-06 ENCOUNTER — Other Ambulatory Visit (HOSPITAL_COMMUNITY)
Admission: RE | Admit: 2021-10-06 | Discharge: 2021-10-06 | Disposition: A | Discharge status: Home / Self Care | Payer: Medicare Other | Source: Ambulatory Visit | Attending: Adult Health | Admitting: Adult Health

## 2021-10-06 ENCOUNTER — Other Ambulatory Visit: Payer: Self-pay

## 2021-10-06 ENCOUNTER — Ambulatory Visit (INDEPENDENT_AMBULATORY_CARE_PROVIDER_SITE_OTHER): Payer: Medicare Other | Admitting: Adult Health

## 2021-10-06 ENCOUNTER — Encounter: Payer: Self-pay | Admitting: Adult Health

## 2021-10-06 VITALS — BP 117/80 | HR 90 | Ht 59.75 in | Wt 209.0 lb

## 2021-10-06 DIAGNOSIS — Z01419 Encounter for gynecological examination (general) (routine) without abnormal findings: Secondary | ICD-10-CM | POA: Insufficient documentation

## 2021-10-06 DIAGNOSIS — Z1151 Encounter for screening for human papillomavirus (HPV): Secondary | ICD-10-CM | POA: Insufficient documentation

## 2021-10-06 NOTE — Progress Notes (Signed)
Patient ID: Teresa Tran, female   DOB: [birth year], 40 y.o.   MRN: [id number] History of Present Illness: Teresa Tran is a 40 year old white female,married, G2P1102, in for a well woman gyn exam and pap. She had physical yesterday with PCP and A1c 6.2. PCP is at [facility].    Current Medications, Allergies, Past Medical History, Past Surgical History, Family History and Social History were reviewed in [employer] record.     Review of Systems: Patient denies any headaches, hearing loss, fatigue, blurred vision, chest pain, abdominal pain, problems with bowel movements, urination, or intercourse. No joint pain or mood swings.  Has some shortness of breath with exertion,had negative chest xray. Periods are a little heavier Has gained some weight back    Physical Exam:BP 117/80 (BP Location: Left Arm, Patient Position: Sitting, Cuff Size: Large)   Pulse 90   Ht 4' 11.75" (1.518 m)   Wt 209 lb (94.8 kg)   LMP 09/04/2021 (Approximate)   BMI 41.16 kg/m   General:  Well developed, well nourished, no acute distress Skin:  Warm and dry Neck:  Midline trachea, slightly enlarged thyroid, good ROM, no lymphadenopathy Lungs; Clear to auscultation bilaterally Breast:  No dominant palpable mass, retraction, or nipple discharge Cardiovascular: Regular rate and rhythm Abdomen:  Soft, non tender, no hepatosplenomegaly Pelvic:  External genitalia is normal in appearance, no lesions.  The vagina is normal in appearance. Urethra has no lesions or masses. The cervix is bulbous. Pap with HR HPV genotyping performed. Uterus is felt to be normal size, shape, and contour.  No adnexal masses or tenderness noted.Bladder is non tender, no masses felt. Rectal: Deferred  Extremities/musculoskeletal:  No swelling or varicosities noted, no clubbing or cyanosis Psych:  No mood changes, alert and cooperative,seems happy AA is 0. Fall risk is low Depression screen [facility] 2/9 10/06/2021 12/25/2019 12/21/2019   Decreased Interest 0 0 0  Down, Depressed, Hopeless 0 0 0  PHQ - 2 Score 0 0 0  Altered sleeping 0 - 0  Tired, decreased energy 0 - 0  Change in appetite 0 - 0  Feeling bad or failure about yourself  0 - 0  Trouble concentrating 0 - 0  Moving slowly or fidgety/restless 0 - 0  Suicidal thoughts 0 - 0  PHQ-9 Score 0 - 0  Difficult doing work/chores - - Not difficult at all  Some recent data might be hidden    GAD 7 : Generalized Anxiety Score 10/06/2021 12/17/2019  Nervous, Anxious, on Edge 0 0  Control/stop worrying 0 0  Worry too much - different things 0 0  Trouble relaxing 0 0  Restless 0 0  Easily annoyed or irritable 0 0  Afraid - awful might happen 0 0  Total GAD 7 Score 0 0    Upstream - 10/06/21 1449       Pregnancy Intention Screening   Does the patient want to become pregnant in the next year? No    Does the patient's partner want to become pregnant in the next year? No    Would the patient like to discuss contraceptive options today? No      Contraception Wrap Up   Current Method Female Sterilization    End Method Female Sterilization    Contraception Counseling Provided No            Co exam with August Luz NP student     Impression and Plan:  1. Encounter for gynecological examination with  Papanicolaou smear of cervix Pap sent Pap in 3 years if normal Physical with PCP Mammogram 12/7/at 12:30 pm at [facility], but call medicare first to see if covered, had one last March 2021.

## 2021-10-08 LAB — CYTOLOGY - PAP
Comment: NEGATIVE
Diagnosis: NEGATIVE
High risk HPV: NEGATIVE

## 2021-10-14 ENCOUNTER — Ambulatory Visit (HOSPITAL_COMMUNITY)
Admission: RE | Admit: 2021-10-14 | Discharge: 2021-10-14 | Disposition: A | Discharge status: Home / Self Care | Payer: Medicare Other | Source: Ambulatory Visit | Attending: Adult Health | Admitting: Adult Health

## 2021-10-14 ENCOUNTER — Other Ambulatory Visit: Payer: Self-pay

## 2021-10-14 DIAGNOSIS — Z1231 Encounter for screening mammogram for malignant neoplasm of breast: Secondary | ICD-10-CM | POA: Insufficient documentation

## 2021-10-15 ENCOUNTER — Telehealth: Payer: Self-pay | Admitting: Adult Health

## 2021-10-15 ENCOUNTER — Other Ambulatory Visit (HOSPITAL_COMMUNITY): Payer: Self-pay | Admitting: Adult Health

## 2021-10-15 DIAGNOSIS — R928 Other abnormal and inconclusive findings on diagnostic imaging of breast: Secondary | ICD-10-CM

## 2021-10-15 NOTE — Telephone Encounter (Signed)
Pt aware of mammogram and need for Follow up

## 2021-10-15 NOTE — Telephone Encounter (Signed)
Patient calling wanting to know why there was a new order for mammo but in. Wants to know if there was something wrong with her last results.patient asking for a phone call

## 2021-11-10 ENCOUNTER — Other Ambulatory Visit: Payer: Self-pay

## 2021-11-10 ENCOUNTER — Ambulatory Visit (HOSPITAL_COMMUNITY)
Admission: RE | Admit: 2021-11-10 | Discharge: 2021-11-10 | Disposition: A | Discharge status: Home / Self Care | Payer: Medicare Other | Source: Ambulatory Visit | Attending: Adult Health | Admitting: Adult Health

## 2021-11-10 DIAGNOSIS — R928 Other abnormal and inconclusive findings on diagnostic imaging of breast: Secondary | ICD-10-CM | POA: Diagnosis not present

## 2021-11-10 DIAGNOSIS — R922 Inconclusive mammogram: Secondary | ICD-10-CM | POA: Diagnosis not present

## 2021-11-11 ENCOUNTER — Ambulatory Visit (INDEPENDENT_AMBULATORY_CARE_PROVIDER_SITE_OTHER): Payer: Medicare Other | Admitting: Gastroenterology

## 2021-11-11 ENCOUNTER — Encounter: Payer: Self-pay | Admitting: Gastroenterology

## 2021-11-11 VITALS — BP 121/79 | HR 85 | Temp 97.7°F | Ht 60.0 in | Wt 208.4 lb

## 2021-11-11 DIAGNOSIS — K219 Gastro-esophageal reflux disease without esophagitis: Secondary | ICD-10-CM

## 2021-11-11 DIAGNOSIS — R195 Other fecal abnormalities: Secondary | ICD-10-CM | POA: Diagnosis not present

## 2021-11-11 DIAGNOSIS — R748 Abnormal levels of other serum enzymes: Secondary | ICD-10-CM | POA: Diagnosis not present

## 2021-11-11 DIAGNOSIS — D649 Anemia, unspecified: Secondary | ICD-10-CM | POA: Diagnosis not present

## 2021-11-11 DIAGNOSIS — R140 Abdominal distension (gaseous): Secondary | ICD-10-CM | POA: Diagnosis not present

## 2021-11-11 DIAGNOSIS — R6889 Other general symptoms and signs: Secondary | ICD-10-CM | POA: Diagnosis not present

## 2021-11-11 DIAGNOSIS — K760 Fatty (change of) liver, not elsewhere classified: Secondary | ICD-10-CM | POA: Diagnosis not present

## 2021-11-11 NOTE — Progress Notes (Signed)
Primary Care Physician: Dettinger, Fransisca Kaufmann, MD  Primary Gastroenterologist:  Elon Alas. Abbey Chatters, DO   Chief Complaint  Patient presents with   change in bowels    When she eats certain things or drinks milk she noticed she goes to bathroom a lot quicker. Lot of bloating/gas    HPI: Teresa Tran is a 41 y.o. female here for follow-up.  Patient was last seen in March 2021.    She has a history of fatty liver, GERD, autoimmune disorders. Last EGD in 2016, possible esophageal web status post dilation, nonerosive gastritis.  Ultrasound in October 2018 with 7 mm tiny hyperechoic nodular density again noted in the right lobe of the liver consistent with tiny hemangioma.  Fatty liver noted.  She has history of positive ANA, homogenous pattern.  Abdominal ultrasound October 2021 with diffuse fatty infiltration of the liver, normal spleen.  Labs from November 2022: Hemoglobin A1c 6.2.  Labs from July 2022: Total bilirubin 0.3, alkaline phosphatase 57, AST 13, ALT 13, albumin 4.1.  White blood cell count 7900, hemoglobin 11.8 low, hematocrit 35.8 low, MCV 80.1, platelets 220,000.  Today: patient reports certain foods cause fecal urgency. Worse with milk, rich foods, fatty foods. Typically occurs with pasta, hamburger, [place] food. Otherwise, BMs regular. No melena, brbpr. Stools dark recently, lasted for two days. Sometimes has some abdominal pain but doesn't last long. She has bloating and gas. Dexilant helps her reflux. If she does not take it she feels terrible.   Complains of fatigue week before and week of menstrual cycle. In general fatigue worse since tick bite in 2020. Takes methylphenidate to keep her focused and give energy. Menstrual cycles a little heavier than in the past. Bleeds five days per month. She has uterine fibroids.   No prior tcs.    Elevated lipase of 164, 04/[facility]21 at [facility]. Presented with epigastric /chest pain, vomiting. LFTs normal.CBC unremarkable.  Weight up from  177 in 01/2020 to 208 today.   Current Outpatient Medications  Medication Sig Dispense Refill   acetaminophen (TYLENOL) 500 MG tablet Take 1,000 mg by mouth every 6 (six) hours as needed for mild pain or headache.     chlorthalidone (HYGROTON) 25 MG tablet Take 1 tablet (25 mg total) by mouth daily. (NEEDS TO BE SEEN BEFORE NEXT REFILL) 30 tablet 0   cholecalciferol (VITAMIN D3) 25 MCG (1000 UNIT) tablet Take 1,000 Units by mouth daily.     DEXILANT 60 MG capsule TAKE 1 CAPSULE BY MOUTH ONCE DAILY BEFORE BREAKFAST 30 capsule 5   methylphenidate 18 MG PO CR tablet Take 1 tablet (18 mg total) by mouth daily. 30 tablet 0   nystatin (MYCOSTATIN/NYSTOP) powder Apply 1 application topically 3 (three) times daily. (Patient taking differently: Apply 1 application topically as needed.) 30 g 3   albuterol (PROVENTIL HFA;VENTOLIN HFA) 108 (90 Base) MCG/ACT inhaler Inhale 2 puffs into the lungs every 4 (four) hours as needed for wheezing or shortness of breath. (Patient not taking: Reported on 11/11/2021) 1 Inhaler 2   methylphenidate 18 MG PO CR tablet Take 1 tablet (18 mg total) by mouth daily. 30 tablet 0   methylphenidate 18 MG PO CR tablet Take 1 tablet (18 mg total) by mouth daily. 30 tablet 0   No current facility-administered medications for this visit.    Allergies as of 11/11/2021 - Review Complete 11/11/2021  Allergen Reaction Noted   Macrobid [nitrofurantoin monohyd macro] Itching 01/09/2014    ROS:  General:  Negative for anorexia, weight loss, fever, chills, fatigue, weakness. ENT: Negative for hoarseness, difficulty swallowing , nasal congestion. CV: Negative for chest pain, angina, palpitations, dyspnea on exertion, peripheral edema.  Respiratory: Negative for dyspnea at rest, dyspnea on exertion, cough, sputum, wheezing.  GI: See history of present illness. GU:  Negative for dysuria, hematuria, urinary incontinence, urinary frequency, nocturnal urination.  Endo: Negative for unusual  weight change.    Physical Examination:   BP 121/79    Pulse 85    Temp 97.7 F (36.5 C)    Ht 5' (1.524 m)    Wt 208 lb 6.4 oz (94.5 kg)    LMP 10/19/2021    BMI 40.70 kg/m   General: Well-nourished, well-developed in no acute distress.  Eyes: No icterus. Mouth: masked Lungs: Clear to auscultation bilaterally.  Heart: Regular rate and rhythm, no murmurs rubs or gallops.  Abdomen: Bowel sounds are normal, nontender, nondistended, no hepatosplenomegaly or masses, no abdominal bruits or hernia , no rebound or guarding.   Extremities: No lower extremity edema. No clubbing or deformities. Neuro: Alert and oriented x 4   Skin: Warm and dry, no jaundice.   Psych: Alert and cooperative, normal mood and affect.  Labs:  See hpi Imaging Studies: MM DIAG BREAST TOMO UNI RIGHT  Result Date: 11/10/2021 CLINICAL DATA:  Screening recall for possible right breast asymmetry. EXAM: DIGITAL DIAGNOSTIC UNILATERAL RIGHT MAMMOGRAM WITH TOMOSYNTHESIS AND CAD TECHNIQUE: Right digital diagnostic mammography and breast tomosynthesis was performed. The images were evaluated with computer-aided detection. COMPARISON:  Previous exams. ACR Breast Density Category b: There are scattered areas of fibroglandular density. FINDINGS: Additional tomograms were performed of the right breast. The initially questioned possible right breast asymmetry resolves on the additional imaging with findings compatible with an area of overlapping fibroglandular tissue. There is no mammographic evidence of malignancy in the right breast. IMPRESSION: No mammographic evidence of malignancy in the right breast. RECOMMENDATION: Screening mammogram in one year.(Code:SM-B-01Y) I have discussed the findings and recommendations with the patient. If applicable, a reminder letter will be sent to the patient regarding the next appointment. BI-RADS CATEGORY  1: Negative. Electronically Signed   By: Everlean Alstrom M.D.   On: 11/10/2021 11:17   MM 3D  SCREEN BREAST BILATERAL  Result Date: 10/15/2021 CLINICAL DATA:  Screening. EXAM: DIGITAL SCREENING BILATERAL MAMMOGRAM WITH TOMOSYNTHESIS AND CAD TECHNIQUE: Bilateral screening digital craniocaudal and mediolateral oblique mammograms were obtained. Bilateral screening digital breast tomosynthesis was performed. The images were evaluated with computer-aided detection. COMPARISON:  Previous exam(s). ACR Breast Density Category b: There are scattered areas of fibroglandular density. FINDINGS: In the right breast, a possible asymmetry warrants further evaluation. In the left breast, no findings suspicious for malignancy. IMPRESSION: Further evaluation is suggested for possible asymmetry in the right breast. RECOMMENDATION: Diagnostic mammogram and possibly ultrasound of the right breast. (Code:FI-R-15M) The patient will be contacted regarding the findings, and additional imaging will be scheduled. BI-RADS CATEGORY  0: Incomplete. Need additional imaging evaluation and/or prior mammograms for comparison. Electronically Signed   By: Abelardo Diesel M.D.   On: 10/15/2021 07:40     Assessment:  GERD: Doing well on Dexilant. Unable to stop medication due to significant recurrent symptoms. Reinforced antireflux measures.   Mild normocytic anemia:  Noted on labs 05/2021. Menstrual cycles little heavier than in the past. No overt GI bleeding. Check ifobt, update labs.   Elevated lipase: noted mild elevated of lipase when presenting with epigastric pain/chest pain radiating into her back and  associated with vomiting. No imaging performed. LFTs were normal. Somewhat nonspecific elevation of lipase. Update labs. If recurrent symptoms would consider CT evaluation of pancreas.   Fatty liver: update labs. Weight increasing. H/o prediabetes/dyslipidemia. Weight loss and exercise important. At risk for NASH.    Fecal urgency/bloating: increased symptoms with dairy may indicate underlying lactose intolerance. Also notes  with rich/greasy foods. Cannot rule out IBS. Otherwise BMs regular. Unlikely dealing with IBD. Previously Alpha-Gal panel felt to be insignificant per allergist. Consider ruling out celiac disease given anemia and h/o autoimmune issues.    Plan: Check ifobt. check lipase, CBC, iron/tibc/ferritin.

## 2021-11-11 NOTE — Patient Instructions (Signed)
Please have labs done today. Collect stool specimen and return it to our office.  We will be in touch with results as available.

## 2021-11-12 DIAGNOSIS — D649 Anemia, unspecified: Secondary | ICD-10-CM | POA: Diagnosis not present

## 2021-11-12 DIAGNOSIS — K760 Fatty (change of) liver, not elsewhere classified: Secondary | ICD-10-CM | POA: Diagnosis not present

## 2021-11-12 DIAGNOSIS — R0609 Other forms of dyspnea: Secondary | ICD-10-CM | POA: Diagnosis not present

## 2021-11-12 DIAGNOSIS — R748 Abnormal levels of other serum enzymes: Secondary | ICD-10-CM | POA: Diagnosis not present

## 2021-11-12 DIAGNOSIS — R6889 Other general symptoms and signs: Secondary | ICD-10-CM | POA: Diagnosis not present

## 2021-11-12 DIAGNOSIS — K219 Gastro-esophageal reflux disease without esophagitis: Secondary | ICD-10-CM | POA: Diagnosis not present

## 2021-11-12 DIAGNOSIS — R059 Cough, unspecified: Secondary | ICD-10-CM | POA: Diagnosis not present

## 2021-11-12 DIAGNOSIS — R195 Other fecal abnormalities: Secondary | ICD-10-CM | POA: Diagnosis not present

## 2021-11-12 DIAGNOSIS — R140 Abdominal distension (gaseous): Secondary | ICD-10-CM | POA: Diagnosis not present

## 2021-11-13 ENCOUNTER — Telehealth: Payer: Self-pay | Admitting: Psychiatry

## 2021-11-13 LAB — COMPREHENSIVE METABOLIC PANEL
AG Ratio: 1.5 (calc) (ref 1.0–2.5)
ALT: 11 U/L (ref 6–29)
AST: 12 U/L (ref 10–30)
Albumin: 4.1 g/dL (ref 3.6–5.1)
Alkaline phosphatase (APISO): 63 U/L (ref 31–125)
BUN: 14 mg/dL (ref 7–25)
CO2: 29 mmol/L (ref 20–32)
Calcium: 9.1 mg/dL (ref 8.6–10.2)
Chloride: 103 mmol/L (ref 98–110)
Creat: 0.83 mg/dL (ref 0.50–0.97)
Globulin: 2.7 g/dL (calc) (ref 1.9–3.7)
Glucose, Bld: 113 mg/dL — ABNORMAL HIGH (ref 65–99)
Potassium: 3.5 mmol/L (ref 3.5–5.3)
Sodium: 140 mmol/L (ref 135–146)
Total Bilirubin: 0.3 mg/dL (ref 0.2–1.2)
Total Protein: 6.8 g/dL (ref 6.1–8.1)

## 2021-11-13 LAB — CBC WITH DIFFERENTIAL/PLATELET
Absolute Monocytes: 462 cells/uL (ref 200–950)
Basophils Absolute: 53 cells/uL (ref 0–200)
Basophils Relative: 0.8 %
Eosinophils Absolute: 79 cells/uL (ref 15–500)
Eosinophils Relative: 1.2 %
HCT: 36.5 % (ref 35.0–45.0)
Hemoglobin: 11.8 g/dL (ref 11.7–15.5)
Lymphs Abs: 1980 cells/uL (ref 850–3900)
MCH: 26.6 pg — ABNORMAL LOW (ref 27.0–33.0)
MCHC: 32.3 g/dL (ref 32.0–36.0)
MCV: 82.2 fL (ref 80.0–100.0)
MPV: 11.1 fL (ref 7.5–12.5)
Monocytes Relative: 7 %
Neutro Abs: 4026 cells/uL (ref 1500–7800)
Neutrophils Relative %: 61 %
Platelets: 275 10*3/uL (ref 140–400)
RBC: 4.44 10*6/uL (ref 3.80–5.10)
RDW: 14.3 % (ref 11.0–15.0)
Total Lymphocyte: 30 %
WBC: 6.6 10*3/uL (ref 3.8–10.8)

## 2021-11-13 LAB — B12 AND FOLATE PANEL
Folate: 16.3 ng/mL
Vitamin B-12: 314 pg/mL (ref 200–1100)

## 2021-11-13 LAB — IRON,TIBC AND FERRITIN PANEL
%SAT: 8 % (calc) — ABNORMAL LOW (ref 16–45)
Ferritin: 12 ng/mL — ABNORMAL LOW (ref 16–154)
Iron: 31 ug/dL — ABNORMAL LOW (ref 40–190)
TIBC: 366 mcg/dL (calc) (ref 250–450)

## 2021-11-13 LAB — LIPASE: Lipase: 26 U/L (ref 7–60)

## 2021-11-13 NOTE — Telephone Encounter (Signed)
Called patient and let her know we did not have samples. She will call other pharmacies to see if she can find the medication in stock and will let us know.

## 2021-11-13 NOTE — Telephone Encounter (Signed)
Patient lm stating the Methylphenidate is not available at the [facility]. She is requesting samples or an alternative because she only has 1 left. Please advise. # [phone].

## 2021-11-14 DIAGNOSIS — R0609 Other forms of dyspnea: Secondary | ICD-10-CM | POA: Diagnosis not present

## 2021-11-18 ENCOUNTER — Other Ambulatory Visit: Payer: Self-pay | Admitting: *Deleted

## 2021-11-18 ENCOUNTER — Telehealth: Payer: Self-pay | Admitting: Internal Medicine

## 2021-11-18 ENCOUNTER — Ambulatory Visit (INDEPENDENT_AMBULATORY_CARE_PROVIDER_SITE_OTHER): Payer: Self-pay | Admitting: Gastroenterology

## 2021-11-18 ENCOUNTER — Other Ambulatory Visit: Payer: Self-pay

## 2021-11-18 DIAGNOSIS — D649 Anemia, unspecified: Secondary | ICD-10-CM

## 2021-11-18 LAB — IFOBT (OCCULT BLOOD): IFOBT: NEGATIVE

## 2021-11-18 MED ORDER — METHYLPHENIDATE HCL ER (OSM) 18 MG PO TBCR: 18.0000 mg | EXTENDED_RELEASE_TABLET | Freq: Every day | ORAL | 0 refills | Status: DC

## 2021-11-18 MED ORDER — METHYLPHENIDATE HCL ER 18 MG PO TB24: 18.0000 mg | ORAL_TABLET | Freq: Every day | ORAL | 0 refills | Status: DC

## 2021-11-18 NOTE — Telephone Encounter (Signed)
PATIENT CALLED ASKING ABOUT TEST RESULTS

## 2021-11-18 NOTE — Addendum Note (Signed)
Addended by: Inda Castle on: 11/18/2021 10:22 AM   Modules accepted: Level of Service

## 2021-11-19 ENCOUNTER — Other Ambulatory Visit: Payer: Self-pay

## 2021-11-19 DIAGNOSIS — D509 Iron deficiency anemia, unspecified: Secondary | ICD-10-CM

## 2021-11-19 NOTE — Telephone Encounter (Signed)
Already handled by [facility]

## 2021-11-26 DIAGNOSIS — D509 Iron deficiency anemia, unspecified: Secondary | ICD-10-CM | POA: Diagnosis not present

## 2021-11-27 LAB — TISSUE TRANSGLUTAMINASE, IGA: (tTG) Ab, IgA: <1 U/mL

## 2021-11-27 LAB — IGA: Immunoglobulin A: 274 mg/dL (ref 47–310)

## 2021-12-08 ENCOUNTER — Telehealth: Payer: Self-pay

## 2021-12-08 ENCOUNTER — Other Ambulatory Visit: Payer: Self-pay

## 2021-12-08 MED ORDER — PEG 3350-KCL-NA BICARB-NACL 420 G PO SOLR: 4000.0000 mL | ORAL | 0 refills | Status: DC

## 2021-12-08 NOTE — Telephone Encounter (Signed)
Pre-op appt 12/24/21 at 3:15pm. Appt letter mailed with procedure instructions. (See result lab result note).

## 2021-12-15 MED ORDER — METHYLPHENIDATE HCL ER (OSM) 18 MG PO TBCR: 18.0000 mg | EXTENDED_RELEASE_TABLET | Freq: Every day | ORAL | 0 refills | Status: DC

## 2021-12-15 NOTE — Telephone Encounter (Signed)
Rx cancelled.

## 2021-12-15 NOTE — Addendum Note (Signed)
Addended by: Sharyl Nimrod on: 12/15/2021 08:32 AM   Modules accepted: Orders

## 2021-12-15 NOTE — Telephone Encounter (Signed)
Please cancel scripts on file for Feb and March at the [facility] in [place].

## 2021-12-23 ENCOUNTER — Telehealth: Payer: Self-pay

## 2021-12-23 NOTE — Telephone Encounter (Signed)
PA for Dexilant DR 60 mg capsule has been approved until 11/07/22. Approval letter to be scanned into pt's chart.

## 2021-12-23 NOTE — Patient Instructions (Signed)
Teresa Tran  12/23/2021     @PREFPERIOPPHARMACY @   Your procedure is scheduled on 12/28/2021.   Report to [facility] at  0700  A.M.   Call this number if you have problems the morning of surgery:  [phone]   Remember:  Follow the diet and prep instructions given to you by the office.    Take these medicines the morning of surgery with A SIP OF WATER                                        dexilant.     Do not wear jewelry, make-up or nail polish.  Do not wear lotions, powders, or perfumes, or deodorant.  Do not shave 48 hours prior to surgery.  Men may shave face and neck.  Do not bring valuables to the hospital.  [facility] is not responsible for any belongings or valuables.  Contacts, dentures or bridgework may not be worn into surgery.  Leave your suitcase in the car.  After surgery it may be brought to your room.  For patients admitted to the hospital, discharge time will be determined by your treatment team.  Patients discharged the day of surgery will not be allowed to drive home and must have someone with them for 24 hours.    Special instructions:   DO NOT smoke tobacco or vape for 24 hours before your procedure.  Please read over the following fact sheets that you were given. Anesthesia Post-op Instructions and Care and Recovery After Surgery      Upper Endoscopy, Adult, Care After This sheet gives you information about how to care for yourself after your procedure. Your health care provider may also give you more specific instructions. If you have problems or questions, contact your health care provider. What can I expect after the procedure? After the procedure, it is common to have: A sore throat. Mild stomach pain or discomfort. Bloating. Nausea. Follow these instructions at home:  Follow instructions from your health care provider about what to eat or drink after your procedure. Return to your normal activities as told by your health  care provider. Ask your health care provider what activities are safe for you. Take over-the-counter and prescription medicines only as told by your health care provider. If you were given a sedative during the procedure, it can affect you for several hours. Do not drive or operate machinery until your health care provider says that it is safe. Keep all follow-up visits as told by your health care provider. This is important. Contact a health care provider if you have: A sore throat that lasts longer than one day. Trouble swallowing. Get help right away if: You vomit blood or your vomit looks like coffee grounds. You have: A fever. Bloody, black, or tarry stools. A severe sore throat or you cannot swallow. Difficulty breathing. Severe pain in your chest or abdomen. Summary After the procedure, it is common to have a sore throat, mild stomach discomfort, bloating, and nausea. If you were given a sedative during the procedure, it can affect you for several hours. Do not drive or operate machinery until your health care provider says that it is safe. Follow instructions from your health care provider about what to eat or drink after your procedure. Return to your normal activities as told by your health care  provider. This information is not intended to replace advice given to you by your health care provider. Make sure you discuss any questions you have with your health care provider. Document Revised: 08/31/2019 Document Reviewed: 03/27/2018 Elsevier Patient Education  2022 [place]. Colonoscopy, Adult, Care After This sheet gives you information about how to care for yourself after your procedure. Your health care provider may also give you more specific instructions. If you have problems or questions, contact your health care provider. What can I expect after the procedure? After the procedure, it is common to have: A small amount of blood in your stool for 24 hours after the  procedure. Some gas. Mild cramping or bloating of your abdomen. Follow these instructions at home: Eating and drinking  Drink enough fluid to keep your urine pale yellow. Follow instructions from your health care provider about eating or drinking restrictions. Resume your normal diet as instructed by your health care provider. Avoid heavy or fried foods that are hard to digest. Activity Rest as told by your health care provider. Avoid sitting for a long time without moving. Get up to take short walks every 1-2 hours. This is important to improve blood flow and breathing. Ask for help if you feel weak or unsteady. Return to your normal activities as told by your health care provider. Ask your health care provider what activities are safe for you. Managing cramping and bloating  Try walking around when you have cramps or feel bloated. Apply heat to your abdomen as told by your health care provider. Use the heat source that your health care provider recommends, such as a moist heat pack or a heating pad. Place a towel between your skin and the heat source. Leave the heat on for 20-30 minutes. Remove the heat if your skin turns bright red. This is especially important if you are unable to feel pain, heat, or cold. You may have a greater risk of getting burned. General instructions If you were given a sedative during the procedure, it can affect you for several hours. Do not drive or operate machinery until your health care provider says that it is safe. For the first 24 hours after the procedure: Do not sign important documents. Do not drink alcohol. Do your regular daily activities at a slower pace than normal. Eat soft foods that are easy to digest. Take over-the-counter and prescription medicines only as told by your health care provider. Keep all follow-up visits as told by your health care provider. This is important. Contact a health care provider if: You have blood in your stool 2-3  days after the procedure. Get help right away if you have: More than a small spotting of blood in your stool. Large blood clots in your stool. Swelling of your abdomen. Nausea or vomiting. A fever. Increasing pain in your abdomen that is not relieved with medicine. Summary After the procedure, it is common to have a small amount of blood in your stool. You may also have mild cramping and bloating of your abdomen. If you were given a sedative during the procedure, it can affect you for several hours. Do not drive or operate machinery until your health care provider says that it is safe. Get help right away if you have a lot of blood in your stool, nausea or vomiting, a fever, or increased pain in your abdomen. This information is not intended to replace advice given to you by your health care provider. Make sure you discuss any  questions you have with your health care provider. Document Revised: 08/31/2019 Document Reviewed: 05/21/2019 Elsevier Patient Education  [place] After This sheet gives you information about how to care for yourself after your procedure. Your health care provider may also give you more specific instructions. If you have problems or questions, contact your health care provider. What can I expect after the procedure? After the procedure, it is common to have: Tiredness. Forgetfulness about what happened after the procedure. Impaired judgment for important decisions. Nausea or vomiting. Some difficulty with balance. Follow these instructions at home: For the time period you were told by your health care provider:   Rest as needed. Do not participate in activities where you could fall or become injured. Do not drive or use machinery. Do not drink alcohol. Do not take sleeping pills or medicines that cause drowsiness. Do not make important decisions or sign legal documents. Do not take care of children on your own. Eating  and drinking Follow the diet that is recommended by your health care provider. Drink enough fluid to keep your urine pale yellow. If you vomit: Drink water, juice, or soup when you can drink without vomiting. Make sure you have little or no nausea before eating solid foods. General instructions Have a responsible adult stay with you for the time you are told. It is important to have someone help care for you until you are awake and alert. Take over-the-counter and prescription medicines only as told by your health care provider. If you have sleep apnea, surgery and certain medicines can increase your risk for breathing problems. Follow instructions from your health care provider about wearing your sleep device: Anytime you are sleeping, including during daytime naps. While taking prescription pain medicines, sleeping medicines, or medicines that make you drowsy. Avoid smoking. Keep all follow-up visits as told by your health care provider. This is important. Contact a health care provider if: You keep feeling nauseous or you keep vomiting. You feel light-headed. You are still sleepy or having trouble with balance after 24 hours. You develop a rash. You have a fever. You have redness or swelling around the IV site. Get help right away if: You have trouble breathing. You have new-onset confusion at home. Summary For several hours after your procedure, you may feel tired. You may also be forgetful and have poor judgment. Have a responsible adult stay with you for the time you are told. It is important to have someone help care for you until you are awake and alert. Rest as told. Do not drive or operate machinery. Do not drink alcohol or take sleeping pills. Get help right away if you have trouble breathing, or if you suddenly become confused. This information is not intended to replace advice given to you by your health care provider. Make sure you discuss any questions you have with your  health care provider. Document Revised: 07/10/2020 Document Reviewed: 09/27/2019 Elsevier Patient Education  2022 [employer].

## 2021-12-24 ENCOUNTER — Encounter (HOSPITAL_COMMUNITY): Payer: Self-pay

## 2021-12-24 ENCOUNTER — Encounter (HOSPITAL_COMMUNITY)
Admission: RE | Admit: 2021-12-24 | Discharge: 2021-12-24 | Disposition: A | Discharge status: Home / Self Care | Payer: Medicare Other | Source: Ambulatory Visit | Attending: Internal Medicine | Admitting: Internal Medicine

## 2021-12-24 ENCOUNTER — Other Ambulatory Visit: Payer: Self-pay

## 2021-12-24 ENCOUNTER — Telehealth: Payer: Self-pay

## 2021-12-24 VITALS — BP 139/85 | HR 87 | Temp 98.5°F | Resp 16 | Ht 60.0 in | Wt 205.0 lb

## 2021-12-24 DIAGNOSIS — Z01818 Encounter for other preprocedural examination: Secondary | ICD-10-CM | POA: Insufficient documentation

## 2021-12-24 HISTORY — DX: Diverticulitis of intestine, part unspecified, without perforation or abscess without bleeding: K57.92

## 2021-12-24 LAB — PREGNANCY, URINE: Preg Test, Ur: NEGATIVE

## 2021-12-24 NOTE — Telephone Encounter (Signed)
Pt called regarding her Dexilant and a PA being needed or have you done it yet. Please call the pt regarding this

## 2021-12-25 NOTE — Telephone Encounter (Signed)
Pt was made aware that PA was approved through 11/07/22 on 12/23/2021. Pt was instructed to have pharmacy rerun claim through insurance.

## 2021-12-25 NOTE — Telephone Encounter (Signed)
Lmom for pt to return my call.  

## 2021-12-28 ENCOUNTER — Ambulatory Visit (HOSPITAL_COMMUNITY): Payer: Medicare Other | Admitting: Anesthesiology

## 2021-12-28 ENCOUNTER — Encounter (HOSPITAL_COMMUNITY): Payer: Self-pay

## 2021-12-28 ENCOUNTER — Ambulatory Visit (HOSPITAL_COMMUNITY)
Admission: RE | Admit: 2021-12-28 | Discharge: 2021-12-28 | Disposition: A | Discharge status: Home / Self Care | Payer: Medicare Other | Attending: Internal Medicine | Admitting: Internal Medicine

## 2021-12-28 ENCOUNTER — Ambulatory Visit (HOSPITAL_BASED_OUTPATIENT_CLINIC_OR_DEPARTMENT_OTHER): Payer: Medicare Other | Admitting: Anesthesiology

## 2021-12-28 ENCOUNTER — Encounter (HOSPITAL_COMMUNITY)
Admission: RE | Disposition: A | Discharge status: Home / Self Care | Payer: Self-pay | Source: Home / Self Care | Attending: Internal Medicine

## 2021-12-28 DIAGNOSIS — I10 Essential (primary) hypertension: Secondary | ICD-10-CM | POA: Insufficient documentation

## 2021-12-28 DIAGNOSIS — Z8619 Personal history of other infectious and parasitic diseases: Secondary | ICD-10-CM | POA: Diagnosis not present

## 2021-12-28 DIAGNOSIS — K219 Gastro-esophageal reflux disease without esophagitis: Secondary | ICD-10-CM | POA: Diagnosis not present

## 2021-12-28 DIAGNOSIS — D509 Iron deficiency anemia, unspecified: Secondary | ICD-10-CM | POA: Diagnosis not present

## 2021-12-28 DIAGNOSIS — K3189 Other diseases of stomach and duodenum: Secondary | ICD-10-CM | POA: Diagnosis not present

## 2021-12-28 DIAGNOSIS — K2970 Gastritis, unspecified, without bleeding: Secondary | ICD-10-CM

## 2021-12-28 DIAGNOSIS — R12 Heartburn: Secondary | ICD-10-CM | POA: Diagnosis present

## 2021-12-28 DIAGNOSIS — M329 Systemic lupus erythematosus, unspecified: Secondary | ICD-10-CM | POA: Insufficient documentation

## 2021-12-28 DIAGNOSIS — K648 Other hemorrhoids: Secondary | ICD-10-CM | POA: Diagnosis not present

## 2021-12-28 DIAGNOSIS — Z8711 Personal history of peptic ulcer disease: Secondary | ICD-10-CM | POA: Diagnosis not present

## 2021-12-28 HISTORY — PX: COLONOSCOPY WITH PROPOFOL: SHX5780

## 2021-12-28 HISTORY — PX: ESOPHAGOGASTRODUODENOSCOPY (EGD) WITH PROPOFOL: SHX5813

## 2021-12-28 SURGERY — COLONOSCOPY WITH PROPOFOL
Anesthesia: General

## 2021-12-28 MED ORDER — LIDOCAINE HCL (CARDIAC) PF 100 MG/5ML IV SOSY
PREFILLED_SYRINGE | INTRAVENOUS | Status: DC | PRN
  Administered 2021-12-28: 50 mg via INTRAVENOUS

## 2021-12-28 MED ORDER — PROPOFOL 500 MG/50ML IV EMUL
INTRAVENOUS | Status: DC | PRN
  Administered 2021-12-28: 150 ug/kg/min via INTRAVENOUS

## 2021-12-28 MED ORDER — LACTATED RINGERS IV SOLN: INTRAVENOUS | Status: DC

## 2021-12-28 MED ORDER — PROPOFOL 10 MG/ML IV BOLUS
INTRAVENOUS | Status: DC | PRN
  Administered 2021-12-28: 100 mg via INTRAVENOUS

## 2021-12-28 NOTE — Transfer of Care (Signed)
Immediate Anesthesia Transfer of Care Note  Patient: Teresa Tran  Procedure(s) Performed: COLONOSCOPY WITH PROPOFOL ESOPHAGOGASTRODUODENOSCOPY (EGD) WITH PROPOFOL  Patient Location: Short Stay  Anesthesia Type:General  Level of Consciousness: awake, alert  and oriented  Airway & Oxygen Therapy: Patient Spontanous Breathing  Post-op Assessment: Report given to RN and Post -op Vital signs reviewed and stable  Post vital signs: Reviewed and stable  Last Vitals:  Vitals Value Taken Time  BP 104/62 12/28/21 0929  Temp 36.9 C 12/28/21 0929  Pulse 80 12/28/21 0929  Resp 16 12/28/21 0929  SpO2 98 % 12/28/21 0929    Last Pain:  Vitals:   12/28/21 0929  TempSrc: Oral  PainSc: 0-No pain      Patients Stated Pain Goal: 5 (37/29/02 1115)  Complications: No notable events documented.

## 2021-12-28 NOTE — Anesthesia Preprocedure Evaluation (Signed)
Anesthesia Evaluation  Patient identified by MRN, date of birth, ID band Patient awake    Reviewed: Allergy & Precautions, NPO status , Patient's Chart, lab work & pertinent test results  Airway Mallampati: II  TM Distance: >3 FB Neck ROM: Full    Dental  (+) Dental Advisory Given, Teeth Intact   Pulmonary neg pulmonary ROS,    Pulmonary exam normal breath sounds clear to auscultation       Cardiovascular Exercise Tolerance: Good hypertension, Pt. on medications Normal cardiovascular exam Rhythm:Regular Rate:Normal     Neuro/Psych  Headaches, PSYCHIATRIC DISORDERS Anxiety PTSDRocky mountain spotted fever    GI/Hepatic Neg liver ROS, PUD, GERD  Medicated and Controlled,  Endo/Other  negative endocrine ROSMorbid obesity  Renal/GU negative Renal ROS  negative genitourinary   Musculoskeletal negative musculoskeletal ROS (+)   Abdominal   Peds negative pediatric ROS (+)  Hematology  (+) Blood dyscrasia, anemia ,   Anesthesia Other Findings Lupus  Rocky mountain spotted fever  Reproductive/Obstetrics negative OB ROS                            Anesthesia Physical Anesthesia Plan  ASA: 3  Anesthesia Plan: General   Post-op Pain Management: Minimal or no pain anticipated   Induction: Intravenous  PONV Risk Score and Plan: TIVA  Airway Management Planned: Nasal Cannula and Natural Airway  Additional Equipment:   Intra-op Plan:   Post-operative Plan:   Informed Consent: I have reviewed the patients History and Physical, chart, labs and discussed the procedure including the risks, benefits and alternatives for the proposed anesthesia with the patient or authorized representative who has indicated his/her understanding and acceptance.     Dental advisory given  Plan Discussed with: CRNA and Surgeon  Anesthesia Plan Comments:         Anesthesia Quick Evaluation

## 2021-12-28 NOTE — Op Note (Signed)
[facility] Patient Name: Teresa Tran Procedure Date: 12/28/2021 9:02 AM MRN: [id number] Date of Birth: [birth year] Attending MD: Elon Alas. Edgar Frisk CSN: [id number] Age: 41 Admit Type: Outpatient Procedure:                Upper GI endoscopy Indications:              Iron deficiency anemia, Heartburn Providers:                Elon Alas. Abbey Chatters, DO, Lambert Mody, Devoria Albe Referring MD:              Medicines:                See the Anesthesia note for documentation of the                            administered medications Complications:            No immediate complications. Estimated Blood Loss:     Estimated blood loss: none. Procedure:                Pre-Anesthesia Assessment:                           - The anesthesia plan was to use monitored                            anesthesia care (MAC).                           After obtaining informed consent, the endoscope was                            passed under direct vision. Throughout the                            procedure, the patient's blood pressure, pulse, and                            oxygen saturations were monitored continuously. The                            [id number]-[id number] ([id number]) scope was introduced through the                            mouth, and advanced to the second part of duodenum.                            The upper GI endoscopy was accomplished without                            difficulty. The patient tolerated the procedure                            well. Scope In: 9:09:53 AM  Scope Out: 9:12:41 AM Total Procedure Duration: 0 hours 2 minutes 48 seconds  Findings:      The Z-line was regular and was found 39 cm from the incisors.      There is no endoscopic evidence of bleeding, areas of erosion,       esophagitis, stenosis, stricture, ulcerations or varices in the entire       esophagus.      Localized mild inflammation characterized by erythema was  found in the       gastric antrum.      The duodenal bulb, first portion of the duodenum and second portion of       the duodenum were normal. Impression:               - Z-line regular, 39 cm from the incisors.                           - Gastritis.                           - Normal duodenal bulb, first portion of the                            duodenum and second portion of the duodenum.                           - No specimens collected. Moderate Sedation:      Per Anesthesia Care Recommendation:           - Patient has a contact number available for                            emergencies. The signs and symptoms of potential                            delayed complications were discussed with the                            patient. Return to normal activities tomorrow.                            Written discharge instructions were provided to the                            patient.                           - Resume previous diet.                           - Continue present medications.                           - Await pathology results.                           - Proceed with colonoscopy Procedure Code(s):        --- Professional ---  84536, Esophagogastroduodenoscopy, flexible,                            transoral; diagnostic, including collection of                            specimen(s) by brushing or washing, when performed                            (separate procedure) Diagnosis Code(s):        --- Professional ---                           K29.70, Gastritis, unspecified, without bleeding                           D50.9, Iron deficiency anemia, unspecified                           R12, Heartburn CPT copyright 2019 American Medical Association. All rights reserved. The codes documented in this report are preliminary and upon coder review may  be revised to meet current compliance requirements. Elon Alas. Abbey Chatters, DO [place] Abbey Chatters,  DO 12/28/2021 9:15:12 AM This report has been signed electronically. Number of Addenda: 0

## 2021-12-28 NOTE — Anesthesia Postprocedure Evaluation (Signed)
Anesthesia Post Note  Patient: Kaileia N Fambrough  Procedure(s) Performed: COLONOSCOPY WITH PROPOFOL ESOPHAGOGASTRODUODENOSCOPY (EGD) WITH PROPOFOL  Patient location during evaluation: Phase II Anesthesia Type: General Level of consciousness: awake and alert and oriented Pain management: pain level controlled Vital Signs Assessment: post-procedure vital signs reviewed and stable Respiratory status: spontaneous breathing, nonlabored ventilation and respiratory function stable Cardiovascular status: blood pressure returned to baseline and stable Postop Assessment: no apparent nausea or vomiting Anesthetic complications: no   No notable events documented.   Last Vitals:  Vitals:   12/28/21 0832 12/28/21 0929  BP: 119/75 104/62  Pulse: 74 80  Resp: 16 16  Temp: 36.6 C 36.9 C  SpO2: 97% 98%    Last Pain:  Vitals:   12/28/21 0929  TempSrc: Oral  PainSc: 0-No pain                 Charlot Gouin C Lynnae Ludemann

## 2021-12-28 NOTE — Discharge Instructions (Addendum)
EGD Discharge instructions Please read the instructions outlined below and refer to this sheet in the next few weeks. These discharge instructions provide you with general information on caring for yourself after you leave the hospital. Your doctor may also give you specific instructions. While your treatment has been planned according to the most current medical practices available, unavoidable complications occasionally occur. If you have any problems or questions after discharge, please call your doctor. ACTIVITY You may resume your regular activity but move at a slower pace for the next 24 hours.  Take frequent rest periods for the next 24 hours.  Walking will help expel (get rid of) the air and reduce the bloated feeling in your abdomen.  No driving for 24 hours (because of the anesthesia (medicine) used during the test).  You may shower.  Do not sign any important legal documents or operate any machinery for 24 hours (because of the anesthesia used during the test).  NUTRITION Drink plenty of fluids.  You may resume your normal diet.  Begin with a light meal and progress to your normal diet.  Avoid alcoholic beverages for 24 hours or as instructed by your caregiver.  MEDICATIONS You may resume your normal medications unless your caregiver tells you otherwise.  WHAT YOU CAN EXPECT TODAY You may experience abdominal discomfort such as a feeling of fullness or gas pains.  FOLLOW-UP Your doctor will discuss the results of your test with you.  SEEK IMMEDIATE MEDICAL ATTENTION IF ANY OF THE FOLLOWING OCCUR: Excessive nausea (feeling sick to your stomach) and/or vomiting.  Severe abdominal pain and distention (swelling).  Trouble swallowing.  Temperature over 101 F (37.8 C).  Rectal bleeding or vomiting of blood.    Colonoscopy Discharge Instructions  Read the instructions outlined below and refer to this sheet in the next few weeks. These discharge instructions provide you with  general information on caring for yourself after you leave the hospital. Your doctor may also give you specific instructions. While your treatment has been planned according to the most current medical practices available, unavoidable complications occasionally occur.   ACTIVITY You may resume your regular activity, but move at a slower pace for the next 24 hours.  Take frequent rest periods for the next 24 hours.  Walking will help get rid of the air and reduce the bloated feeling in your belly (abdomen).  No driving for 24 hours (because of the medicine (anesthesia) used during the test).   Do not sign any important legal documents or operate any machinery for 24 hours (because of the anesthesia used during the test).  NUTRITION Drink plenty of fluids.  You may resume your normal diet as instructed by your doctor.  Begin with a light meal and progress to your normal diet. Heavy or fried foods are harder to digest and may make you feel sick to your stomach (nauseated).  Avoid alcoholic beverages for 24 hours or as instructed.  MEDICATIONS You may resume your normal medications unless your doctor tells you otherwise.  WHAT YOU CAN EXPECT TODAY Some feelings of bloating in the abdomen.  Passage of more gas than usual.  Spotting of blood in your stool or on the toilet paper.  IF YOU HAD POLYPS REMOVED DURING THE COLONOSCOPY: No aspirin products for 7 days or as instructed.  No alcohol for 7 days or as instructed.  Eat a soft diet for the next 24 hours.  FINDING OUT THE RESULTS OF YOUR TEST Not all test results are available  during your visit. If your test results are not back during the visit, make an appointment with your caregiver to find out the results. Do not assume everything is normal if you have not heard from your caregiver or the medical facility. It is important for you to follow up on all of your test results.  SEEK IMMEDIATE MEDICAL ATTENTION IF: You have more than a spotting of  blood in your stool.  Your belly is swollen (abdominal distention).  You are nauseated or vomiting.  You have a temperature over 101.  You have abdominal pain or discomfort that is severe or gets worse throughout the day.   Your upper endoscopy was relatively unremarkable besides mild amount inflammation in your stomach.  I did not see any tightening of your esophagus so I did not perform dilation today.  Your small bowel appeared normal.  Continue on Dexilant.  Your colonoscopy was relatively unremarkable.  I did not find any polyps or evidence of colon cancer.  I recommend repeating colonoscopy in 10 years for colon cancer screening purposes.  Follow up with GI in 6 months.    I hope you have a great rest of your week!  Elon Alas. Abbey Chatters, D.O. Gastroenterology and Hepatology [facility]

## 2021-12-28 NOTE — Op Note (Signed)
[facility] Patient Name: Teresa Tran Procedure Date: 12/28/2021 9:00 AM MRN: [id number] Date of Birth: September 20, 1981 Attending MD: Elon Alas. Abbey Chatters DO CSN: [id number] Age: 41 Admit Type: Outpatient Procedure:                Colonoscopy Indications:              Iron deficiency anemia Providers:                Elon Alas. Abbey Chatters, DO, Lambert Mody, Devoria Albe Referring MD:              Medicines:                See the Anesthesia note for documentation of the                            administered medications Complications:            No immediate complications. Estimated Blood Loss:     Estimated blood loss: none. Procedure:                Pre-Anesthesia Assessment:                           - The anesthesia plan was to use monitored                            anesthesia care (MAC).                           After obtaining informed consent, the colonoscope                            was passed under direct vision. Throughout the                            procedure, the patient's blood pressure, pulse, and                            oxygen saturations were monitored continuously. The                            [id number]-[id number] ([id number]) scope was introduced through                            the anus and advanced to the the cecum, identified                            by appendiceal orifice and ileocecal valve. The                            colonoscopy was performed without difficulty. The                            patient tolerated the procedure well. The quality  of the bowel preparation was evaluated using the                            BBPS [facility] Bowel Preparation Scale) with scores                            of: Right Colon = 3, Transverse Colon = 3 and Left                            Colon = 3 (entire mucosa seen well with no residual                            staining, small fragments of stool or opaque                             liquid). The total BBPS score equals 9. Scope In: 9:15:54 AM Scope Out: 9:25:33 AM Scope Withdrawal Time: 0 hours 7 minutes 30 seconds  Total Procedure Duration: 0 hours 9 minutes 39 seconds  Findings:      The perianal and digital rectal examinations were normal.      Non-bleeding internal hemorrhoids were found during endoscopy.      The exam was otherwise without abnormality. Impression:               - Non-bleeding internal hemorrhoids.                           - The examination was otherwise normal.                           - No specimens collected. Moderate Sedation:      Per Anesthesia Care Recommendation:           - Patient has a contact number available for                            emergencies. The signs and symptoms of potential                            delayed complications were discussed with the                            patient. Return to normal activities tomorrow.                            Written discharge instructions were provided to the                            patient.                           - Resume previous diet.                           - Continue present medications.                           -  Repeat colonoscopy in 10 years for screening                            purposes.                           - Return to GI clinic in 6 months. Procedure Code(s):        --- Professional ---                           [id number], Colonoscopy, flexible; diagnostic, including                            collection of specimen(s) by brushing or washing,                            when performed (separate procedure) Diagnosis Code(s):        --- Professional ---                           K64.8, Other hemorrhoids                           D50.9, Iron deficiency anemia, unspecified CPT copyright 2019 American Medical Association. All rights reserved. The codes documented in this report are preliminary and upon coder review may  be revised  to meet current compliance requirements. Elon Alas. Abbey Chatters, DO [place] Abbey Chatters, DO 12/28/2021 9:27:36 AM This report has been signed electronically. Number of Addenda: 0

## 2021-12-28 NOTE — H&P (Signed)
Primary Care Physician:  Pcp, No Primary Gastroenterologist:  Dr. Abbey Chatters  Pre-Procedure History & Physical: HPI:  Teresa Tran is a 41 y.o. female is here for an EGD and colonoscopy to be performed for iron deficiency anemia, GERD  Past Medical History:  Diagnosis Date   Allergy    medicine   Anemia    BV (bacterial vaginosis) 08/15/2014   CSF leak    Diverticulitis    GERD (gastroesophageal reflux disease)    Headache(784.0)    Hypertension    Low back pain 08/15/2014   Lupus ([place])    Obesity    [facility] spotted fever 04/2016   Ulcer    Urinary frequency 08/30/2014   Vaginal discharge 08/15/2014   Vaginal irritation 02/12/2014   Yeast infection 02/12/2014    Past Surgical History:  Procedure Laterality Date   CHOLECYSTECTOMY  Feb 2015   Dr. 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 Authorizing Provider  acetaminophen (TYLENOL) 500 MG tablet Take 1,000 mg by mouth every 6 (six) hours as needed for mild pain or headache.   Yes [provider]  chlorthalidone (HYGROTON) 25 MG tablet Take 1 tablet (25 mg total) by mouth daily. (NEEDS TO BE SEEN BEFORE NEXT REFILL) 02/23/21  Yes Dettinger, Fransisca Kaufmann, MD  cholecalciferol (VITAMIN D3) 25 MCG (1000 UNIT) tablet Take 1,000 Units by mouth daily.   Yes [provider]  DEXILANT 60 MG capsule TAKE 1 CAPSULE BY MOUTH ONCE DAILY BEFORE BREAKFAST 02/26/21  Yes Carlis Stable, NP  methylphenidate (CONCERTA) 18 MG PO CR tablet Take 1 tablet (18 mg total) by mouth daily. 12/16/21  Yes Thayer Headings, PMHNP  methylphenidate (CONCERTA) 18 MG PO CR tablet Take 1 tablet (18 mg total) by mouth daily. 01/13/22  Yes Thayer Headings, PMHNP  methylphenidate 18 MG PO CR tablet Take 1 tablet (18 mg total) by mouth daily. 10/12/21  Yes Thayer Headings, PMHNP  methylphenidate 18 MG PO CR tablet Take 1 tablet (18 mg total) by mouth daily. 11/18/21  Yes Thayer Headings, PMHNP  polyethylene glycol-electrolytes (TRILYTE) 420 g solution Take 4,000 mLs by mouth as directed. 12/08/21  Yes Eloise Harman, DO  methylphenidate 18 MG PO CR tablet Take 1 tablet (18 mg total) by mouth daily. 09/14/21   Thayer Headings, PMHNP  nystatin (MYCOSTATIN/NYSTOP) powder Apply 1 application topically 3 (three) times daily. Patient  not taking: Reported on 12/23/2021 12/25/19   Estill Dooms, NP  cetirizine (ZYRTEC ALLERGY) 10 MG tablet Take 1 tablet (10 mg total) by mouth daily. Patient not taking: Reported on 02/09/2021 10/30/20 03/13/21  Emerson Monte, FNP    Allergies as of 12/08/2021 - Review Complete 11/11/2021  Allergen Reaction Noted   Macrobid [nitrofurantoin monohyd macro] Itching 01/09/2014    Family History  Problem Relation Age of Onset   Depression Father    Anxiety disorder  Father    Diabetes Father    Hypertension Father    Hyperlipidemia Father    Kidney disease Father    Mental illness Father    Pneumonia Father    Stroke Father    Depression Sister    Anxiety disorder Sister    Cancer Sister        cervical   Stroke Sister    Depression Brother    Anxiety disorder Brother    Cancer - Other Paternal Grandfather            Cancer Paternal Grandfather        prostate   Kidney disease Mother    Diabetes Mother    Cancer Mother        kidney   Hyperlipidemia Mother    Hypertension Mother    Stroke Mother    Aneurysm Maternal Grandfather    Cancer Maternal Grandmother 53       breast   Cancer Paternal Grandmother        bladder   ADD / ADHD Neg Hx    Alcohol abuse Neg Hx    Drug abuse Neg Hx    Bipolar disorder Neg Hx    Dementia Neg Hx    OCD Neg Hx    Paranoid behavior Neg Hx    Schizophrenia Neg Hx    Seizures Neg Hx    Sexual abuse Neg Hx    Physical abuse Neg Hx    Colon cancer Neg Hx     Social History   Socioeconomic History   Marital status: Married    Spouse name: Nathaneil Canary   Number of children: 2   Years of education: 16   Highest education level: Bachelor's degree (e.g., BA, AB, BS)  Occupational History   Occupation: homehealth  Tobacco Use   Smoking status: Never   Smokeless tobacco: Never  Vaping Use   Vaping Use: Never used  Substance and Sexual Activity   Alcohol use: No    Alcohol/week: 0.0 standard drinks   Drug use: No   Sexual activity: Yes    Birth control/protection: Surgical    Comment: tubal  Other Topics Concern   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ator   Lives with husband and 2 children   One Neurosurgeon   Active in church   Social Determinants of Health   Financial Resource Strain: Low Risk    Difficulty of Paying Living Expenses: Not hard at all  Food Insecurity: No Food Insecurity   Worried About Charity fundraiser in the Last Year: Never true    Arboriculturist in the Last Year: Never true  Transportation Needs: No Transportation Needs   Lack of Transportation (Medical): No   Lack of Transportation (Non-Medical): No  Physical Activity: Insufficiently Active   Days of Exercise per Week: 1 day   Minutes of Exercise per Session: 30 min  Stress: No Stress Concern Present  Feeling of Stress : Not at all  Social Connections: Socially Integrated   Frequency of Communication with Friends and Family: More than three times a week   Frequency of Social Gatherings with Friends and Family: More than three times a week   Attends Religious Services: More than 4 times per year   Active Member of Clubs or Organizations: Yes   Attends Music therapist: More than 4 times per year   Marital Status: Married  Human resources officer Violence: Not At Risk   Fear of Current or Ex-Partner: No   Emotionally Abused: No   Physically Abused: No   Sexually Abused: No    Review of Systems: See HPI, otherwise negative ROS  Physical Exam: Vital signs in last 24 hours: Temp:  [97.8 F (36.6 C)] 97.8 F (36.6 C) (02/20 0832) Pulse Rate:  [74] 74 (02/20 0832) Resp:  [16] 16 (02/20 0832) BP: (119)/(75) 119/75 (02/20 0832) SpO2:  [97 %] 97 % (02/20 0832)   General:   Alert,  Well-developed, well-nourished, pleasant and cooperative in NAD Head:  Normocephalic and atraumatic. Eyes:  Sclera clear, no icterus.   Conjunctiva pink. Ears:  Normal auditory acuity. Nose:  No deformity, discharge,  or lesions. Mouth:  No deformity or lesions, dentition normal. Neck:  Supple; no masses or thyromegaly. Lungs:  Clear throughout to auscultation.   No wheezes, crackles, or rhonchi. No acute distress. Heart:  Regular rate and rhythm; no murmurs, clicks, rubs,  or gallops. Abdomen:  Soft, nontender and nondistended. No masses, hepatosplenomegaly or hernias noted. Normal bowel sounds, without guarding, and without rebound.   Msk:  Symmetrical without gross  deformities. Normal posture. Extremities:  Without clubbing or edema. Neurologic:  Alert and  oriented x4;  grossly normal neurologically. Skin:  Intact without significant lesions or rashes. Cervical Nodes:  No significant cervical adenopathy. Psych:  Alert and cooperative. Normal mood and affect.  Impression/Plan: Teresa Tran is here for an EGD and colonoscopy to be performed for iron deficiency anemia, GERD  The risks of the procedure including infection, bleed, or perforation as well as benefits, limitations, alternatives and imponderables have been reviewed with the patient. Questions have been answered. All parties agreeable.

## 2021-12-30 ENCOUNTER — Encounter (HOSPITAL_COMMUNITY): Payer: Self-pay | Admitting: Internal Medicine

## 2022-02-18 ENCOUNTER — Telehealth: Payer: Self-pay | Admitting: Psychiatry

## 2022-02-18 ENCOUNTER — Other Ambulatory Visit: Payer: Self-pay

## 2022-02-18 DIAGNOSIS — F908 Attention-deficit hyperactivity disorder, other type: Secondary | ICD-10-CM

## 2022-02-18 MED ORDER — METHYLPHENIDATE HCL ER 18 MG PO TB24: 18.0000 mg | ORAL_TABLET | Freq: Every day | ORAL | 0 refills | Status: DC

## 2022-02-18 NOTE — Telephone Encounter (Signed)
Pended to Dr. Clovis Pu, covering for [facility].  ?

## 2022-02-18 NOTE — Telephone Encounter (Signed)
Patient called in for refill on Concerta '18mg'$ . States that she only has enough through tomorrow and needs ASAP. Ph: [place] 4/26 [place] ?

## 2022-02-19 ENCOUNTER — Telehealth: Payer: Self-pay | Admitting: Psychiatry

## 2022-02-19 NOTE — Telephone Encounter (Signed)
Prior Approval received for Methylphenidate ER 18 mg effective 11/08/2021-11/07/2022 PA# [id number] with [employer] ?

## 2022-02-19 NOTE — Telephone Encounter (Signed)
Pt has called today and advised a Prior Auth request was being sent today for her Concerta from [employer].  I just checked the fax machine and it has been received here.  She is asking that the PA be expedited as she has taken her last pill today. ? ?Pls call her back and let he know the status of the request [phone] ? ?Next appt 4/26 ?

## 2022-02-19 NOTE — Telephone Encounter (Signed)
PA was initiated today. Forwarded to [facility] to review before submitting.  ?

## 2022-03-03 ENCOUNTER — Encounter: Payer: Self-pay | Admitting: Psychiatry

## 2022-03-03 ENCOUNTER — Ambulatory Visit (INDEPENDENT_AMBULATORY_CARE_PROVIDER_SITE_OTHER): Payer: Medicare Other | Admitting: Psychiatry

## 2022-03-03 DIAGNOSIS — F908 Attention-deficit hyperactivity disorder, other type: Secondary | ICD-10-CM

## 2022-03-03 MED ORDER — METHYLPHENIDATE HCL ER 18 MG PO TB24: 18.0000 mg | ORAL_TABLET | Freq: Every day | ORAL | 0 refills | Status: DC

## 2022-03-03 NOTE — Progress Notes (Signed)
Teresa Tran ?[id number] ?08-08-1981 ?41 y.o. ? ?Subjective:  ? ?Patient ID:  Teresa Tran is a 41 y.o. (DOB 19-Aug-1981) female. ? ?Chief Complaint:  ?Chief Complaint  ?Patient presents with  ? Follow-up  ?  ADHD, anxiety, and depression  ? ? ?HPI ?Teresa Tran presents to the office today for follow-up of ADHD and history of anxiety and depression. Denies depressed mood. Denies anxiety. She noticed her concentration was not as good without Concerta. Her energy is improved with Concerta. She reports that long-term she would like to come off Concerta. She reports that Concerta seems to be effective throughout the day into the early evening. Sleeping well. Appetite has been ok. Energy and motivation have been ok with medication. Denies SI.  ? ?Will be going to [place] in October and to [place] in December for missions trip.  ? ?Son is working for a Production assistant, radio and will take some college classes. 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  ? ?She reports that she has headaches with Concerta 27 mg.  ? ?Past Psychiatric Medication Trials: ?Rexulti- increased HA's, drowsiness ?Cymbalta ?Buspar ?Cerefolin ?Topamax ?Prozac ?Celexa ?Lexapro ?Xanax ?Ativan ?Lithium-tremors ?Lamictal ?risperdal ?Seroquel ?Wellbutrin- Ineffective, HA ?Phentermine ?Gabapentin- drowsiness ?Methylphenidate-Effective for concentration in the morning ?Concerta- Had increased jitteriness and HR on 36 mg. Headaches on 27 mg.  ?Propranolol- caused dizziness ? ?GAD-7   ? ?[place] Office Visit from 10/06/2021 in [facility] Office Visit from 12/17/2019 in [place]  ?Total GAD-7 Score 0 0  ? ?  ? ?PHQ2-9   ? ?[place] Office Visit from 10/06/2021 in [facility] Office Visit from 12/25/2019 in [place] from 12/21/2019 in [place] Visit from 12/17/2019 in [place] Visit from 09/14/2019 in  [place]  ?PHQ-2 Total Score 0 0 0 0 0  ?PHQ-9 Total Score 0 -- 0 0 --  ? ?  ? ?Flowsheet Row Pre-Admission Testing 61 from 12/24/2021 in [place] [facility] from 03/13/2021 in [facility] [place]  ?C-SSRS RISK CATEGORY No Risk No Risk  ? ?  ?  ? ?Review of Systems:  ?Review of Systems  ?Musculoskeletal:  Positive for arthralgias and back pain. Negative for gait problem.  ?Neurological:  Negative for tremors.  ?Psychiatric/Behavioral:    ?     Please refer to HPI  ? ?Medications: I have reviewed the patient's current medications. ? ?Current Outpatient Medications  ?Medication Sig Dispense Refill  ? acetaminophen (TYLENOL) 500 MG tablet Take 1,000 mg by mouth every 6 (six) hours as needed for mild pain or headache.    ? chlorthalidone (HYGROTON) 25 MG tablet Take 1 tablet (25 mg total) by mouth daily. (NEEDS TO BE SEEN BEFORE NEXT REFILL) 30 tablet 0  ? cholecalciferol (VITAMIN D3) 25 MCG (1000 UNIT) tablet Take 1,000 Units by mouth daily.    ? DEXILANT 60 MG capsule TAKE 1 CAPSULE BY MOUTH ONCE DAILY BEFORE BREAKFAST 30 capsule 5  ? [START ON 05/15/2022] methylphenidate 18 MG PO CR tablet Take 1 tablet (18 mg total) by mouth daily. 30 tablet 0  ? [START ON 04/17/2022] methylphenidate 18 MG PO CR tablet Take 1 tablet (18 mg total) by mouth daily. 30 tablet 0  ? [START ON 03/20/2022] methylphenidate 18 MG PO CR tablet Take 1 tablet (18 mg total) by mouth daily. 30 tablet 0  ? ?  No current facility-administered medications for this visit.  ? ? ?Medication Side Effects: Other: Notices slight increase in HR about 1-2 hours after taking Concerta ? ?Allergies:  ?Allergies  ?Allergen Reactions  ? Macrobid [Nitrofurantoin Monohyd Macro] Itching  ? ? ?Past Medical History:  ?Diagnosis Date  ? Allergy   ? medicine  ? Anemia   ? BV (bacterial vaginosis) 08/15/2014  ? CSF leak   ? Diverticulitis   ? GERD (gastroesophageal reflux disease)   ? Headache(784.0)   ? Hypertension   ? Low back  pain 08/15/2014  ? Lupus ([place])   ? Obesity   ? [place] fever 04/2016  ? Ulcer   ? Urinary frequency 08/30/2014  ? Vaginal discharge 08/15/2014  ? Vaginal irritation 02/12/2014  ? Yeast infection 02/12/2014  ? ? ?Past Medical History, Surgical history, Social history, and Family history were reviewed and updated as appropriate.  ? ?Please see review of systems for further details on the patient's review from today.  ? ?Objective:  ? ?Physical Exam:  ?BP 137/90   Pulse 93  ? ?Physical Exam ?Neurological:  ?   Mental Status: She is alert and oriented to person, place, and time.  ?   Cranial Nerves: No dysarthria.  ?Psychiatric:     ?   Attention and Perception: Attention and perception normal.     ?   Mood and Affect: Mood normal.     ?   Speech: Speech normal.     ?   Behavior: Behavior is cooperative.     ?   Thought Content: Thought content normal. Thought content is not paranoid or delusional. Thought content does not include homicidal or suicidal ideation. Thought content does not include homicidal or suicidal plan.     ?   Cognition and Memory: Cognition and memory normal.     ?   Judgment: Judgment normal.  ?   Comments: Insight intact  ? ? ?Lab Review:  ?   ?Component Value Date/Time  ? NA 140 11/12/2021 0704  ? NA 142 12/17/2019 1039  ? K 3.5 11/12/2021 0704  ? CL 103 11/12/2021 0704  ? CO2 29 11/12/2021 0704  ? GLUCOSE 113 (H) 11/12/2021 0704  ? BUN 14 11/12/2021 0704  ? BUN 10 12/17/2019 1039  ? CREATININE 0.83 11/12/2021 0704  ? CALCIUM 9.1 11/12/2021 0704  ? PROT 6.8 11/12/2021 0704  ? PROT 7.3 12/17/2019 1039  ? ALBUMIN 4.5 12/17/2019 1039  ? AST 12 11/12/2021 0704  ? ALT 11 11/12/2021 0704  ? ALKPHOS 79 12/17/2019 1039  ? BILITOT 0.3 11/12/2021 0704  ? BILITOT 0.5 12/17/2019 1039  ? GFRNONAA 96 12/17/2019 1039  ? GFRNONAA 116 09/01/2017 1635  ? GFRAA 111 12/17/2019 1039  ? GFRAA 134 09/01/2017 1635  ? ? ?   ?Component Value Date/Time  ? WBC 6.6 11/12/2021 0704  ? RBC 4.44 11/12/2021 0704   ? HGB 11.8 11/12/2021 0704  ? HGB 13.6 12/17/2019 1039  ? HCT 36.5 11/12/2021 0704  ? HCT 40.7 12/17/2019 1039  ? PLT 275 11/12/2021 0704  ? PLT 210 12/17/2019 1039  ? MCV 82.2 11/12/2021 0704  ? MCV 78 (L) 12/17/2019 1039  ? MCH 26.6 (L) 11/12/2021 0704  ? MCHC 32.3 11/12/2021 0704  ? RDW 14.3 11/12/2021 0704  ? RDW 14.7 12/17/2019 1039  ? LYMPHSABS 1,980 11/12/2021 0704  ? LYMPHSABS 1.3 12/17/2019 1039  ? MONOABS 0.5 10/09/2016 1416  ? EOSABS 79 11/12/2021 0704  ? EOSABS 0.0  12/17/2019 1039  ? BASOSABS 53 11/12/2021 0704  ? BASOSABS 0.1 12/17/2019 1039  ? ? ?Lithium Lvl  ?Date Value Ref Range Status  ?08/22/2014 0.80 0.80 - 1.40 mEq/L Final  ?  ? ?No results found for: PHENYTOIN, PHENOBARB, VALPROATE, CBMZ  ? ?.res ?Assessment: Plan:   ?Will continue current plan of care since target signs and symptoms are well controlled without any tolerability issues. ?Continue Concerta 18 mg po qd for ADHD.  ?Pt advised to contact office if she needs an early fill or 2-3 month supply when she is traveling out of country on upcoming mission trips. ?Pt to follow-up in 6 months or sooner if clinically indicated.  ?Requested pt call in 3 months to provide update and request additional scripts.  ?Patient advised to contact office with any questions, adverse effects, or acute worsening in signs and symptoms. ? ?T[place]ona was seen today for follow-up. ? ?Diagnoses and all orders for this visit: ? ?Attention deficit hyperactivity disorder (ADHD), other type ?-     methylphenidate 18 MG PO CR tablet; Take 1 tablet (18 mg total) by mouth daily. ?-     methylphenidate 18 MG PO CR tablet; Take 1 tablet (18 mg total) by mouth daily. ?-     methylphenidate 18 MG PO CR tablet; Take 1 tablet (18 mg total) by mouth daily. ? ?  ? ?Please see After Visit Summary for patient specific instructions. ? ?Future Appointments  ?Date Time Provider [place]  ?06/28/2022  9:30 AM Mahala Menghini, PA-C [facility]  ? ? ?No orders of the defined types  were placed in this encounter. ? ? ?------------------------------- ?

## 2022-04-29 ENCOUNTER — Other Ambulatory Visit: Payer: Self-pay | Admitting: Nurse Practitioner

## 2022-04-29 ENCOUNTER — Telehealth: Payer: Self-pay | Admitting: Internal Medicine

## 2022-04-29 NOTE — Telephone Encounter (Signed)
Refill request has been sent to the provider.

## 2022-04-29 NOTE — Telephone Encounter (Signed)
PATIENT NEEDS HER DEXLIANT SENT TO [facility] IN [facility]

## 2022-06-17 MED ORDER — METHYLPHENIDATE HCL ER 18 MG PO TB24: 18.0000 mg | ORAL_TABLET | Freq: Every day | ORAL | 0 refills | Status: DC

## 2022-06-28 ENCOUNTER — Ambulatory Visit: Payer: Medicare Other | Admitting: Gastroenterology

## 2022-06-28 NOTE — Progress Notes (Deleted)
GI Office Note    Referring Provider: No ref. provider found Primary Care Physician:  Pcp, No  Primary Gastroenterologist: Elon Alas. Abbey Chatters, DO   Chief Complaint   No chief complaint on file.   History of Present Illness   Teresa Tran is a 41 y.o. female presenting today for follow up of IDA, change in stools, fatty liver, GERD. She completed EGD/colonoscopy in 12/2021 as outlined below.  Celiac serologies were negative.  iFOBT negative.  B12 and folate normal.  Iron 31, TIBC 366, iron saturation is 8%, ferritin 12.  Hemoglobin normal at 11.8.  History of elevated lipase in 2022 at 164.  Repeat lipase normal earlier this year.    EGD 12/2021: - Z-line regular, 39 cm from the incisors. - Gastritis. - Normal duodenal bulb, first portion of the duodenum and second portion of the duodenum. - No specimens collected.  Colonoscopy 12/2021: -nonbleeding internal hemorrhoids -next colonoscopy in 10 years   Medications   Current Outpatient Medications  Medication Sig Dispense Refill   acetaminophen (TYLENOL) 500 MG tablet Take 1,000 mg by mouth every 6 (six) hours as needed for mild pain or headache.     chlorthalidone (HYGROTON) 25 MG tablet Take 1 tablet (25 mg total) by mouth daily. (NEEDS TO BE SEEN BEFORE NEXT REFILL) 30 tablet 0   cholecalciferol (VITAMIN D3) 25 MCG (1000 UNIT) tablet Take 1,000 Units by mouth daily.     DEXILANT 60 MG capsule TAKE 1 CAPSULE BY MOUTH ONCE DAILY BEFORE BREAKFAST 30 capsule 11   methylphenidate 18 MG PO CR tablet Take 1 tablet (18 mg total) by mouth daily. 30 tablet 0   methylphenidate 18 MG PO CR tablet Take 1 tablet (18 mg total) by mouth daily. 30 tablet 0   [START ON 07/19/2022] methylphenidate 18 MG PO CR tablet Take 1 tablet (18 mg total) by mouth daily. 60 tablet 0   No current facility-administered medications for this visit.    Allergies   Allergies as of 06/28/2022 - Review Complete 03/03/2022  Allergen Reaction Noted    Macrobid [nitrofurantoin monohyd macro] Itching 01/09/2014      Review of Systems   General: Negative for anorexia, weight loss, fever, chills, fatigue, weakness. ENT: Negative for hoarseness, difficulty swallowing , nasal congestion. CV: Negative for chest pain, angina, palpitations, dyspnea on exertion, peripheral edema.  Respiratory: Negative for dyspnea at rest, dyspnea on exertion, cough, sputum, wheezing.  GI: See history of present illness. GU:  Negative for dysuria, hematuria, urinary incontinence, urinary frequency, nocturnal urination.  Endo: Negative for unusual weight change.     Physical Exam   There were no vitals taken for this visit.   General: Well-nourished, well-developed in no acute distress.  Eyes: No icterus. Mouth: Oropharyngeal mucosa moist and pink , no lesions erythema or exudate. Lungs: Clear to auscultation bilaterally.  Heart: Regular rate and rhythm, no murmurs rubs or gallops.  Abdomen: Bowel sounds are normal, nontender, nondistended, no hepatosplenomegaly or masses,  no abdominal bruits or hernia , no rebound or guarding.  Rectal: ***  Extremities: No lower extremity edema. No clubbing or deformities. Neuro: Alert and oriented x 4   Skin: Warm and dry, no jaundice.   Psych: Alert and cooperative, normal mood and affect.  Labs   Lab Results  Component Value Date   LIPASE 26 11/12/2021   Lab Results  Component Value Date   CREATININE 0.83 11/12/2021   BUN 14 11/12/2021   NA 140 11/12/2021  K 3.5 11/12/2021   CL 103 11/12/2021   CO2 29 11/12/2021   Lab Results  Component Value Date   WBC 6.6 11/12/2021   HGB 11.8 11/12/2021   HCT 36.5 11/12/2021   MCV 82.2 11/12/2021   PLT 275 11/12/2021   Lab Results  Component Value Date   ALT 11 11/12/2021   AST 12 11/12/2021   ALKPHOS 79 12/17/2019   BILITOT 0.3 11/12/2021   Lab Results  Component Value Date   IRON 31 (L) 11/12/2021   TIBC 366 11/12/2021   FERRITIN 12 (L) 11/12/2021    Lab Results  Component Value Date   VITAMINB12 314 11/12/2021   Lab Results  Component Value Date   FOLATE 16.3 11/12/2021    Imaging Studies   No results found.  Assessment   GERD:  Iron deficiency:  Fatty liver: LFTs normal.  Fecal urgency/bloating:  Elevated lipase: Previously noted mild elevation of lipase when presenting with epigastric pain/chest pain with vomiting in 2022.  No imaging was performed.  LFTs were normal.  Elevated lipase earlier this year was normal.  If recurrent symptoms would consider CT evaluation of the pancreas.     PLAN   ***   Laureen Ochs. Bobby Rumpf, Page, [facility]

## 2022-07-14 DIAGNOSIS — Z23 Encounter for immunization: Secondary | ICD-10-CM | POA: Diagnosis not present

## 2022-07-21 DIAGNOSIS — Z23 Encounter for immunization: Secondary | ICD-10-CM | POA: Diagnosis not present

## 2022-07-21 DIAGNOSIS — Z7184 Encounter for health counseling related to travel: Secondary | ICD-10-CM | POA: Diagnosis not present

## 2022-07-22 ENCOUNTER — Telehealth: Payer: Self-pay | Admitting: Psychiatry

## 2022-07-22 NOTE — Telephone Encounter (Signed)
Pt LVM reporting the country she is visiting does not allow that med in country generic Concerta. Requesting to canc Rx sent for #60 and send #30. Pt asking advise about not taking med while in country. Contact pt @ [phone]

## 2022-07-23 ENCOUNTER — Other Ambulatory Visit: Payer: Self-pay

## 2022-07-23 DIAGNOSIS — F908 Attention-deficit hyperactivity disorder, other type: Secondary | ICD-10-CM

## 2022-07-23 MED ORDER — METHYLPHENIDATE HCL ER 18 MG PO TB24: 18.0000 mg | ORAL_TABLET | Freq: Every day | ORAL | 0 refills | Status: DC

## 2022-07-23 NOTE — Telephone Encounter (Signed)
Pended.

## 2022-07-27 MED ORDER — METHYLPHENIDATE HCL ER 18 MG PO TB24: 18.0000 mg | ORAL_TABLET | Freq: Every day | ORAL | 0 refills | Status: DC

## 2022-07-27 NOTE — Addendum Note (Signed)
Addended by: Sharyl Nimrod on: 07/27/2022 09:25 AM   Modules accepted: Orders

## 2022-08-04 ENCOUNTER — Ambulatory Visit: Payer: Medicare Other | Admitting: Psychiatry

## 2022-08-04 DIAGNOSIS — Z23 Encounter for immunization: Secondary | ICD-10-CM | POA: Diagnosis not present

## 2022-10-11 ENCOUNTER — Other Ambulatory Visit (HOSPITAL_COMMUNITY): Payer: Self-pay | Admitting: Adult Health

## 2022-10-11 DIAGNOSIS — Z1231 Encounter for screening mammogram for malignant neoplasm of breast: Secondary | ICD-10-CM

## 2022-10-13 DIAGNOSIS — R5383 Other fatigue: Secondary | ICD-10-CM | POA: Diagnosis not present

## 2022-10-13 DIAGNOSIS — L710 Perioral dermatitis: Secondary | ICD-10-CM | POA: Diagnosis not present

## 2022-10-13 DIAGNOSIS — E669 Obesity, unspecified: Secondary | ICD-10-CM | POA: Diagnosis not present

## 2022-10-13 DIAGNOSIS — R790 Abnormal level of blood mineral: Secondary | ICD-10-CM | POA: Diagnosis not present

## 2022-10-13 DIAGNOSIS — R350 Frequency of micturition: Secondary | ICD-10-CM | POA: Diagnosis not present

## 2022-10-13 DIAGNOSIS — K760 Fatty (change of) liver, not elsewhere classified: Secondary | ICD-10-CM | POA: Diagnosis not present

## 2022-10-13 DIAGNOSIS — Z0000 Encounter for general adult medical examination without abnormal findings: Secondary | ICD-10-CM | POA: Diagnosis not present

## 2022-10-13 DIAGNOSIS — Z8639 Personal history of other endocrine, nutritional and metabolic disease: Secondary | ICD-10-CM | POA: Diagnosis not present

## 2022-10-13 DIAGNOSIS — Z7184 Encounter for health counseling related to travel: Secondary | ICD-10-CM | POA: Diagnosis not present

## 2022-10-13 DIAGNOSIS — R7303 Prediabetes: Secondary | ICD-10-CM | POA: Diagnosis not present

## 2022-11-10 ENCOUNTER — Ambulatory Visit (HOSPITAL_COMMUNITY)
Admission: RE | Admit: 2022-11-10 | Discharge: 2022-11-10 | Disposition: A | Discharge status: Home / Self Care | Payer: Medicare Other | Source: Ambulatory Visit | Attending: Adult Health | Admitting: Adult Health

## 2022-11-10 DIAGNOSIS — Z1231 Encounter for screening mammogram for malignant neoplasm of breast: Secondary | ICD-10-CM | POA: Insufficient documentation

## 2022-11-17 ENCOUNTER — Encounter: Payer: Self-pay | Admitting: Emergency Medicine

## 2022-11-17 ENCOUNTER — Ambulatory Visit
Admission: EM | Admit: 2022-11-17 | Discharge: 2022-11-17 | Disposition: A | Discharge status: Home / Self Care | Payer: Medicare Other | Attending: Nurse Practitioner | Admitting: Nurse Practitioner

## 2022-11-17 ENCOUNTER — Other Ambulatory Visit: Payer: Self-pay

## 2022-11-17 DIAGNOSIS — J069 Acute upper respiratory infection, unspecified: Secondary | ICD-10-CM | POA: Diagnosis not present

## 2022-11-17 DIAGNOSIS — J029 Acute pharyngitis, unspecified: Secondary | ICD-10-CM

## 2022-11-17 DIAGNOSIS — Z1152 Encounter for screening for COVID-19: Secondary | ICD-10-CM | POA: Diagnosis not present

## 2022-11-17 LAB — POCT RAPID STREP A (OFFICE): Rapid Strep A Screen: NEGATIVE

## 2022-11-17 MED ORDER — BENZONATATE 100 MG PO CAPS
100.0000 mg | ORAL_CAPSULE | Freq: Three times a day (TID) | ORAL | 0 refills | Status: DC | PRN
Start: 2022-11-17 — End: 2022-11-24

## 2022-11-17 NOTE — ED Provider Notes (Signed)
[facility]    CSN: [id number] Arrival date & time: 11/17/22  0904      History   Chief Complaint Chief Complaint  Patient presents with   Sore Throat    HPI Teresa Tran is a 42 y.o. female.   Patient presents today with 3-day history of tactile fevers, dry cough, out of breath when talking, discomfort in her chest after coughing, nasal congestion and runny nose, postnasal drainage, sore throat, headache, decreased appetite, diminished sense of taste, and fatigue.  She denies shortness of breath or chest tightness, chest congestion, ear pain or drainage, abdominal pain, nausea/vomiting, and diarrhea.  Reports her daughter was seen last week and tested for strep throat - rapid test was negative.  Patient has been taking Tylenol and nighttime Sudafed for symptoms without much benefit.  Patient denies history of chronic lung disease.  Reports she has had to use the inhalers occasionally when she has viral upper respiratory infections.  Denies smoking or vaping history.    Past Medical History:  Diagnosis Date   Allergy    medicine   Anemia    BV (bacterial vaginosis) 08/15/2014   CSF leak    Diverticulitis    GERD (gastroesophageal reflux disease)    Headache(784.0)    Hypertension    Low back pain 08/15/2014   Lupus ([place])    Obesity    [facility] spotted fever 04/2016   Ulcer    Urinary frequency 08/30/2014   Vaginal discharge 08/15/2014   Vaginal irritation 02/12/2014   Yeast infection 02/12/2014    Patient Active Problem List   Diagnosis Date Noted   Elevated lipase 11/11/2021   Bloating 11/11/2021   Fatty liver 01/22/2020   Autoimmune disorder ([place]) 01/22/2020   Encounter for well woman exam with routine gynecological exam 12/25/2019   Mass of upper inner quadrant of left breast 12/25/2019   Infection of skin and subcutaneous tissue due to fungus 12/25/2019   Yeast vaginitis 09/14/2019   Vaginal irritation 09/14/2019   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   Loose stools 12/07/2018   Overflow incontinence of urine 07/24/2018   Labial skin tag 07/24/2018   Absence of bladder continence 07/24/2018   Prediabetes 06/12/2018   Liver lesion 09/07/2017   Proteinuria 09/07/2017   Essential hypertension 09/01/2017   Migraine 06/21/2017   Chronic fatigue 06/21/2017   Obesity (BMI 30.0-34.9) 06/21/2017   GERD (gastroesophageal reflux disease) 12/22/2016   Uterine leiomyoma 12/02/2016   Menorrhagia with irregular cycle 12/02/2016   Normocytic anemia 09/11/2014   PUD (peptic ulcer disease) 06/06/2014   Vitamin D deficiency 02/19/2013   PTSD (post-traumatic stress disorder) 03/23/2012    Past Surgical History:  Procedure Laterality Date   CHOLECYSTECTOMY  Feb 2015   Dr. Ladona Horns, [facility]   COLONOSCOPY WITH PROPOFOL N/A 12/28/2021   Procedure: COLONOSCOPY WITH PROPOFOL;  Surgeon: Eloise Harman, DO;  [facility];  Service: Endoscopy;  Laterality: N/A;  9:00am   ENDOSCOPIC CONCHA BULLOSA RESECTION Left 07/20/2016   Procedure: ENDOSCOPIC LEFT  CONCHA BULLOSA RESECTION;  Surgeon: Leta Baptist, MD;  Location: [place];  Service: ENT;  Laterality: Left;   ESOPHAGOGASTRODUODENOSCOPY N/A 06/11/2014   JIR:CVEL DUODENITIS/MODERATE EROSIVE GASTRICTIS IN ANTRUM/PARTIALLY HEALED ULCERS   ESOPHAGOGASTRODUODENOSCOPY N/A 05/06/2015   Procedure: ESOPHAGOGASTRODUODENOSCOPY (EGD);  Surgeon: Danie Binder, MD;  [facility];  Service: Endoscopy;  Laterality: N/A;  1300 - moved to 1:15 - office  to notify   ESOPHAGOGASTRODUODENOSCOPY (EGD) WITH ESOPHAGEAL DILATION N/A 02/11/2014   Dr. 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, PUD, duodenitis, negative H.pylori    ESOPHAGOGASTRODUODENOSCOPY (EGD) WITH PROPOFOL N/A 12/28/2021   Procedure: ESOPHAGOGASTRODUODENOSCOPY (EGD) WITH PROPOFOL;  Surgeon: Eloise Harman, DO;  [facility];  Service: Endoscopy;  Laterality: N/A;    EXCISION CHONCHA BULLOSA N/A 07/20/2016   Procedure: NASAL SEPTOPLASTY WITH BILATERAL TURBINATE REDUCTION;  Surgeon: Leta Baptist, MD;  Location: [place];  Service: ENT;  Laterality: N/A;   RHINOPLASTY     SAVORY DILATION N/A 05/06/2015   Procedure: SAVORY DILATION;  Surgeon: Danie Binder, MD;  [facility];  Service: Endoscopy;  Laterality: N/A;   TUBAL LIGATION      OB History     Gravida  2   Para  2   Term  1   Preterm  1   AB      Living  2      SAB      IAB      Ectopic      Multiple      Live Births  2            Home Medications    Prior to Admission medications   Medication Sig Start Date End Date Taking? Authorizing Provider  benzonatate (TESSALON) 100 MG capsule Take 1-2 capsules (100-200 mg total) by mouth 3 (three) times daily as needed for cough. Do not take with alcohol or while driving or operating heavy machinery.  May cause drowsiness. 11/17/22  Yes Eulogio Bear, NP  ferrous sulfate 325 (65 FE) MG tablet Take 325 mg by mouth daily with breakfast.   Yes [provider]  acetaminophen (TYLENOL) 500 MG tablet Take 1,000 mg by mouth every 6 (six) hours as needed for mild pain or headache.    [provider]  chlorthalidone (HYGROTON) 25 MG tablet Take 1 tablet (25 mg total) by mouth daily. (NEEDS TO BE SEEN BEFORE NEXT REFILL) 02/23/21   Dettinger, Fransisca Kaufmann, MD  cholecalciferol (VITAMIN D3) 25 MCG (1000 UNIT) tablet Take 1,000 Units by mouth daily.    [provider]  DEXILANT 60 MG capsule TAKE 1 CAPSULE BY MOUTH ONCE DAILY BEFORE BREAKFAST 04/30/22   Mahala Menghini, PA-C  cetirizine (ZYRTEC ALLERGY) 10 MG tablet Take 1 tablet (10 mg total) by mouth daily. Patient not taking: Reported on 02/09/2021 10/30/20 03/13/21  Emerson Monte, FNP    Family History Family History  Problem Relation Age of Onset   Depression Father    Anxiety disorder Father    Diabetes Father    Hypertension Father     Hyperlipidemia Father    Kidney disease Father    Mental illness Father    Pneumonia Father    Stroke Father    Depression Sister    Anxiety disorder Sister    Cancer Sister        cervical   Stroke Sister    Depression Brother    Anxiety disorder Brother    Cancer - Other Paternal Grandfather            Cancer Paternal Grandfather        prostate   Kidney disease Mother    Diabetes Mother    Cancer Mother        kidney   Hyperlipidemia Mother    Hypertension Mother    Stroke Mother    Aneurysm Maternal Grandfather  Cancer Maternal Grandmother 10       breast   Cancer Paternal Grandmother        bladder   ADD / ADHD Neg Hx    Alcohol abuse Neg Hx    Drug abuse Neg Hx    Bipolar disorder Neg Hx    Dementia Neg Hx    OCD Neg Hx    Paranoid behavior Neg Hx    Schizophrenia Neg Hx    Seizures Neg Hx    Sexual abuse Neg Hx    Physical abuse Neg Hx    Colon cancer Neg Hx     Social History Social History   Tobacco Use   Smoking status: Never   Smokeless tobacco: Never  Vaping Use   Vaping Use: Never used  Substance Use Topics   Alcohol use: No    Alcohol/week: 0.0 standard drinks of alcohol   Drug use: No     Allergies   Macrobid [nitrofurantoin monohyd macro]   Review of Systems Review of Systems Per HPI  Physical Exam Triage Vital Signs ED Triage Vitals  Enc Vitals Group     BP 11/17/22 0931 113/80     Pulse Rate 11/17/22 0931 80     Resp 11/17/22 0931 20     Temp 11/17/22 0931 98.1 F (36.7 C)     Temp Source 11/17/22 0931 Oral     SpO2 11/17/22 0931 96 %     Weight --      Height --      Head Circumference --      Peak Flow --      Pain Score 11/17/22 0928 6     Pain Loc --      Pain Edu? --      Excl. in [place]? --    No data found.  Updated Vital Signs BP 113/80 (BP Location: Right Arm)   Pulse 80   Temp 98.1 F (36.7 C) (Oral)   Resp 20   LMP 10/24/2022   SpO2 96%   Visual Acuity Right Eye Distance:   Left Eye  Distance:   Bilateral Distance:    Right Eye Near:   Left Eye Near:    Bilateral Near:     Physical Exam Vitals and nursing note reviewed.  Constitutional:      General: She is not in acute distress.    Appearance: Normal appearance. She is not ill-appearing or toxic-appearing.  HENT:     Head: Normocephalic and atraumatic.     Right Ear: Tympanic membrane, ear canal and external ear normal. No drainage, swelling or tenderness. No middle ear effusion. Tympanic membrane is not erythematous.     Left Ear: Tympanic membrane, ear canal and external ear normal. No drainage, swelling or tenderness.  No middle ear effusion. Tympanic membrane is not erythematous.     Nose: Rhinorrhea present. No congestion.     Mouth/Throat:     Mouth: Mucous membranes are moist.     Pharynx: Oropharynx is clear. Posterior oropharyngeal erythema present. No oropharyngeal exudate.     Tonsils: No tonsillar exudate. 1+ on the right. 1+ on the left.  Eyes:     General: No scleral icterus.    Extraocular Movements: Extraocular movements intact.  Cardiovascular:     Rate and Rhythm: Normal rate and regular rhythm.     Heart sounds: Normal heart sounds. No murmur heard. Pulmonary:     Effort: Pulmonary effort is normal. No respiratory distress.  Breath sounds: Normal breath sounds. No wheezing, rhonchi or rales.  Abdominal:     General: Abdomen is flat. Bowel sounds are normal. There is no distension.     Palpations: Abdomen is soft.     Tenderness: There is no abdominal tenderness.  Musculoskeletal:     Cervical back: Normal range of motion and neck supple.  Lymphadenopathy:     Cervical: No cervical adenopathy.  Skin:    General: Skin is warm and dry.     Coloration: Skin is not jaundiced or pale.     Findings: No erythema or rash.  Neurological:     Mental Status: She is alert and oriented to person, place, and time.     Motor: No weakness.  Psychiatric:        Behavior: Behavior is cooperative.       UC Treatments / Results  Labs (all labs ordered are listed, but only abnormal results are displayed) Labs Reviewed  SARS CORONAVIRUS 2 (TAT 6-24 HRS)  POCT RAPID STREP A (OFFICE)    EKG   Radiology No results found.  Procedures Procedures (including critical care time)  Medications Ordered in UC Medications - No data to display  Initial Impression / Assessment and Plan / UC Course  I have reviewed the triage vital signs and the nursing notes.  Pertinent labs & imaging results that were available during my care of the patient were reviewed by me and considered in my medical decision making (see chart for details).   Patient is well-appearing, normotensive, afebrile, not tachycardic, not tachypneic, oxygenating well on room air.    Viral URI with cough Encounter for screening for COVID-19 Rapid strep throat test negative Suspect viral etiology and discussed difference between her and daughter's case as I saw daughter last week COVID-19 testing obtained for rule out Patient is a good candidate for Paxlovid if she tests positive Last GFR greater than 90 in December 2023 Supportive care discussed with patient Can start Tessalon Perles as needed for dry cough ER and return precautions discussed  The patient was given the opportunity to ask questions.  All questions answered to their satisfaction.  The patient is in agreement to this plan.    Final Clinical Impressions(s) / UC Diagnoses   Final diagnoses:  Viral URI with cough  Encounter for screening for COVID-19     Discharge Instructions      You have a viral upper respiratory infection.  Symptoms should improve over the next week to 10 days.  If you develop chest pain or shortness of breath, go to the emergency room.  We have tested you today for COVID-19.  You will see the results in Mychart and we will call you with positive results.    Please stay home and isolate until you are aware of the results.     Some things that can make you feel better are: - Increased rest - Increasing fluid with water/sugar free electrolytes - Acetaminophen and ibuprofen as needed for fever/pain - Salt water gargling, chloraseptic spray and throat lozenges - OTC guaifenesin (Mucinex) 600 mg twice daily for congestion - Saline sinus flushes or a neti pot for congestion - Humidifying the air -Tessalon Perles every 8 hours as needed for dry cough     ED Prescriptions     Medication Sig Dispense Auth. Provider   benzonatate (TESSALON) 100 MG capsule Take 1-2 capsules (100-200 mg total) by mouth 3 (three) times daily as needed for cough. Do not take  with alcohol or while driving or operating heavy machinery.  May cause drowsiness. 30 capsule Eulogio Bear, NP      PDMP not reviewed this encounter.   Eulogio Bear, NP 11/17/22 1029

## 2022-11-17 NOTE — Discharge Instructions (Addendum)
You have a viral upper respiratory infection.  Symptoms should improve over the next week to 10 days.  If you develop chest pain or shortness of breath, go to the emergency room.  We have tested you today for COVID-19.  You will see the results in Mychart and we will call you with positive results.    Please stay home and isolate until you are aware of the results.    Some things that can make you feel better are: - Increased rest - Increasing fluid with water/sugar free electrolytes - Acetaminophen and ibuprofen as needed for fever/pain - Salt water gargling, chloraseptic spray and throat lozenges - OTC guaifenesin (Mucinex) 600 mg twice daily for congestion - Saline sinus flushes or a neti pot for congestion - Humidifying the air -Tessalon Perles every 8 hours as needed for dry cough

## 2022-11-17 NOTE — ED Triage Notes (Addendum)
Pt reports sore throat, nasal congestion x2 days. Pt reports daughter was treated for pharyngitis/strep throat last week. Denies any known fevers.

## 2022-11-18 LAB — SARS CORONAVIRUS 2 (TAT 6-24 HRS): SARS Coronavirus 2: NEGATIVE

## 2022-11-24 ENCOUNTER — Ambulatory Visit: Payer: Medicare Other | Admitting: Adult Health

## 2022-11-24 ENCOUNTER — Encounter: Payer: Self-pay | Admitting: Gastroenterology

## 2022-11-24 ENCOUNTER — Ambulatory Visit (INDEPENDENT_AMBULATORY_CARE_PROVIDER_SITE_OTHER): Payer: Medicare Other | Admitting: Gastroenterology

## 2022-11-24 VITALS — BP 118/80 | HR 73 | Temp 98.1°F | Ht 60.0 in | Wt 210.2 lb

## 2022-11-24 DIAGNOSIS — D509 Iron deficiency anemia, unspecified: Secondary | ICD-10-CM | POA: Diagnosis not present

## 2022-11-24 DIAGNOSIS — R140 Abdominal distension (gaseous): Secondary | ICD-10-CM | POA: Diagnosis not present

## 2022-11-24 DIAGNOSIS — K760 Fatty (change of) liver, not elsewhere classified: Secondary | ICD-10-CM

## 2022-11-24 DIAGNOSIS — K219 Gastro-esophageal reflux disease without esophagitis: Secondary | ICD-10-CM | POA: Diagnosis not present

## 2022-11-24 NOTE — Patient Instructions (Signed)
Please stop Dexilant for 2 weeks, then complete stool test for H.pylori. You may use Pepcid twice daily for heartburn but no Pepto, TUMs, or other acid reflux medication during this time. Once stool collected you may resume Dexilant.  We will set up a small bowel capsule test to further evaluate iron deficiency anemia.  Please limit foods in the list below to no more than once per week. These may be contributing to your bloating.   Foods to avoid Fruits Fresh, dried, and juiced forms of apple, pear, watermelon, peach, plum, cherries, apricots, blackberries, boysenberries, figs, nectarines, and mango. Avocado. Vegetables Chicory root, artichoke, asparagus, cabbage, snow peas, Brussels sprouts, broccoli, sugar snap peas, mushrooms, celery, and cauliflower. Onions, garlic, leeks, and the white part of scallions. Grains Wheat, including kamut, durum, and semolina. Barley and bulgur. Couscous. Wheat-based cereals. Wheat noodles, bread, crackers, and pastries. Meats and other proteins Fried or fatty meat. Sausage. Cashews and pistachios. Soybeans, baked beans, black beans, chickpeas, kidney beans, fava beans, navy beans, lentils, black-eyed peas, and split peas. Dairy Milk, yogurt, ice cream, and soft cheese. Cream and sour cream. Milk-based sauces. Custard. Buttermilk. Soy milk. Seasoning and other foods Any sugar-free gum or candy. Foods that contain artificial sweeteners such as sorbitol, mannitol, isomalt, or xylitol. Foods that contain honey, high-fructose corn syrup, or agave. Bouillon, vegetable stock, beef stock, and chicken stock. Garlic and onion powder. Condiments made with onion, such as hummus, chutney, pickles, relish, salad dressing, and salsa. Tomato paste. Beverages Chicory-based drinks. Coffee substitutes. Chamomile tea. Fennel tea. Sweet or fortified wines such as port or sherry. Diet soft drinks made with isomalt, mannitol, maltitol, sorbitol, or xylitol. Apple, pear, and mango juice.  Juices with high-fructose corn syrup. The items listed above may not be a complete list of foods and beverages you should avoid. Contact a dietitian for more information.

## 2022-11-24 NOTE — Progress Notes (Signed)
GI Office Note    Referring Provider: No ref. provider found Primary Care Physician:  Pcp, No  Primary Gastroenterologist: Elon Alas. Abbey Chatters, DO   Chief Complaint   Chief Complaint  Patient presents with   Follow-up    Discuss low iron and bloating.    History of Present Illness   Teresa Tran is a 42 y.o. female presenting today for follow-up.  She was last seen in January.  She has a history of fatty liver, GERD, autoimmune disorders, fecal urgency/bloating.  She has a history of mild normocytic anemia.  Celiac serologies negative in January 2023. Treated for H. pylori in 2015.  Restarted iron few weeks ago. Was on it when we saw her last visit but came off at some point. Started B12 too. Her B12 was normal at 314 one year ago but recently was 173. No unintentional weight loss. Has been on multivitamins. Feels like eating healthy. Regular menstrual cycles, heavier than in the past but does not feel they are very heavy. No clots. Heaviest days are the first three. Bleeds for 5 days total. Denies melena, brbpr.   Chronic fatigue for years. Started after remote tick bite. States she had been on concerta for fatigue but came off before traveling to [place] in 08/2022.   Dexilant helps reflux. Has a lot of bloated in the upper abdomen. Seems to be more intolerant to dairy. Milk causes diarrhea. Tries almond milk now. On regular days, about 2 stools per day. Certain foods may make go more often. No melena, brbpr. Complains of lower back pain after meals, can last couple of hours. Certain foods trigger the back pain such a heavier meal, pasta, bread.  Labs from December 2023: Creatinine 0.67, albumin 4, total bilirubin 0.3, alkaline phosphatase 63, AST 13, ALT 13, hemoglobin 12.7, hematocrit 38, platelets 246,000, ferritin 7, iron saturations 10%, TIBC 405, iron 41, B12 low at 173.  In January 2023, ferritin was 12.  Colonoscopy February 2023: -Nonbleeding internal hemorrhoids -Next  colonoscopy 10 years  EGD February 2023: -Gastritis -no biopsy  Abdominal ultrasound October 2021: IMPRESSION: 1. Status post cholecystectomy. No intra or extrahepatic biliary dilatation. 2. Diffuse fatty infiltration of the liver but no focal hepatic lesions. 3. Limited visualization the pancreas. 4. The spleen and kidneys are unremarkable.   Medications   Current Outpatient Medications  Medication Sig Dispense Refill   acetaminophen (TYLENOL) 500 MG tablet Take 1,000 mg by mouth every 6 (six) hours as needed for mild pain or headache.     chlorthalidone (HYGROTON) 25 MG tablet Take 1 tablet (25 mg total) by mouth daily. (NEEDS TO BE SEEN BEFORE NEXT REFILL) 30 tablet 0   cholecalciferol (VITAMIN D3) 25 MCG (1000 UNIT) tablet Take 1,000 Units by mouth daily.     cyanocobalamin (VITAMIN B12) 1000 MCG tablet Take 1 tablet by mouth daily.     DEXILANT 60 MG capsule TAKE 1 CAPSULE BY MOUTH ONCE DAILY BEFORE BREAKFAST 30 capsule 11   ferrous sulfate 325 (65 FE) MG tablet Take 325 mg by mouth daily with breakfast.     No current facility-administered medications for this visit.    Allergies   Allergies as of 11/24/2022 - Review Complete 11/24/2022  Allergen Reaction Noted   Macrobid [nitrofurantoin monohyd macro] Itching 01/09/2014     Past Medical History   Past Medical History:  Diagnosis Date   Allergy    medicine   Anemia    BV (bacterial vaginosis) 08/15/2014  CSF leak    Diverticulitis    GERD (gastroesophageal reflux disease)    Headache(784.0)    Hypertension    Low back pain 08/15/2014   Lupus ([place])    Obesity    [facility] spotted fever 04/2016   Ulcer    Urinary frequency 08/30/2014   Vaginal discharge 08/15/2014   Vaginal irritation 02/12/2014   Yeast infection 02/12/2014    Past Surgical History   Past Surgical History:  Procedure Laterality Date   CHOLECYSTECTOMY  Feb 2015   Dr. Ladona Horns, [facility]   COLONOSCOPY WITH PROPOFOL N/A 12/28/2021    Procedure: COLONOSCOPY WITH PROPOFOL;  Surgeon: Eloise Harman, DO;  [facility];  Service: Endoscopy;  Laterality: N/A;  9:00am   ENDOSCOPIC CONCHA BULLOSA RESECTION Left 07/20/2016   Procedure: ENDOSCOPIC LEFT  CONCHA BULLOSA RESECTION;  Surgeon: Leta Baptist, MD;  Location: [place];  Service: ENT;  Laterality: Left;   ESOPHAGOGASTRODUODENOSCOPY N/A 06/11/2014   SWF:UXNA DUODENITIS/MODERATE EROSIVE GASTRICTIS IN ANTRUM/PARTIALLY HEALED ULCERS   ESOPHAGOGASTRODUODENOSCOPY N/A 05/06/2015   Procedure: ESOPHAGOGASTRODUODENOSCOPY (EGD);  Surgeon: Danie Binder, MD;  [facility];  Service: Endoscopy;  Laterality: N/A;  1300 - moved to 1:15 - office to notify   ESOPHAGOGASTRODUODENOSCOPY (EGD) WITH ESOPHAGEAL DILATION N/A 02/11/2014   Dr. 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, PUD, duodenitis, negative H.pylori    ESOPHAGOGASTRODUODENOSCOPY (EGD) WITH PROPOFOL N/A 12/28/2021   Procedure: ESOPHAGOGASTRODUODENOSCOPY (EGD) WITH PROPOFOL;  Surgeon: Eloise Harman, DO;  [facility];  Service: Endoscopy;  Laterality: N/A;   EXCISION CHONCHA BULLOSA N/A 07/20/2016   Procedure: NASAL SEPTOPLASTY WITH BILATERAL TURBINATE REDUCTION;  Surgeon: Leta Baptist, MD;  Location: [place];  Service: ENT;  Laterality: N/A;   RHINOPLASTY     SAVORY DILATION N/A 05/06/2015   Procedure: SAVORY DILATION;  Surgeon: Danie Binder, MD;  [facility];  Service: Endoscopy;  Laterality: N/A;   TUBAL LIGATION      Past Family History   Family History  Problem Relation Age of Onset   Depression Father    Anxiety disorder Father    Diabetes Father    Hypertension Father    Hyperlipidemia Father    Kidney disease Father    Mental illness Father    Pneumonia Father    Stroke Father    Depression Sister    Anxiety disorder Sister    Cancer Sister        cervical   Stroke Sister    Depression Brother    Anxiety disorder Brother    Cancer -  Other Paternal Grandfather            Cancer Paternal Grandfather        prostate   Kidney disease Mother    Diabetes Mother    Cancer Mother        kidney   Hyperlipidemia Mother    Hypertension Mother    Stroke Mother    Aneurysm Maternal Grandfather    Cancer Maternal Grandmother 69       breast   Cancer Paternal Grandmother        bladder   ADD / ADHD Neg Hx    Alcohol abuse Neg Hx    Drug abuse Neg Hx    Bipolar disorder Neg Hx    Dementia Neg Hx    OCD Neg Hx    Paranoid behavior Neg Hx    Schizophrenia Neg Hx    Seizures Neg  Hx    Sexual abuse Neg Hx    Physical abuse Neg Hx    Colon cancer Neg Hx     Past Social History   Social History   Socioeconomic History   Marital status: Married    Spouse name: Nathaneil Canary   Number of children: 2   Years of education: 16   Highest education level: Bachelor's degree (e.g., BA, AB, BS)  Occupational History   Occupation: homehealth  Tobacco Use   Smoking status: Never   Smokeless tobacco: Never  Vaping Use   Vaping Use: Never used  Substance and Sexual Activity   Alcohol use: No    Alcohol/week: 0.0 standard drinks of alcohol   Drug use: No   Sexual activity: Yes    Birth control/protection: Surgical    Comment: tubal  Other Topics Concern   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ator   Lives with husband and 2 children   One cat   Active in church   Social Determinants of Health   Financial Resource Strain: [place]  (10/06/2021)   Overall Financial Resource Strain (CARDIA)    Difficulty of Paying Living Expenses: Not hard at all  Food Insecurity: No Food Insecurity (10/06/2021)   Hunger Vital Sign    Worried About Running Out of Food in the Last Year: Never true    Ran Out of Food in the Last Year: Never true  Transportation Needs: No Transportation Needs (10/06/2021)   PRAPARE - Hydrologist (Medical): No    Lack of Transportation  (Non-Medical): No  Physical Activity: Insufficiently Active (10/06/2021)   Exercise Vital Sign    Days of Exercise per Week: 1 day    Minutes of Exercise per Session: 30 min  Stress: No Stress Concern Present (10/06/2021)   [place]    Feeling of Stress : Not at all  Social Connections: [place] (10/06/2021)   Social Connection and Isolation Panel [NHANES]    Frequency of Communication with Friends and Family: More than three times a week    Frequency of Social Gatherings with Friends and Family: More than three times a week    Attends Religious Services: More than 4 times per year    Active Member of [employer] or Organizations: Yes    Attends Music therapist: More than 4 times per year    Marital Status: Married  Human resources officer Violence: Not At Risk (10/06/2021)   Humiliation, Afraid, Rape, and Kick questionnaire    Fear of Current or Ex-Partner: No    Emotionally Abused: No    Physically Abused: No    Sexually Abused: No    Review of Systems   General: Negative for anorexia, weight loss, fever, chills,  weakness. See hpi ENT: Negative for hoarseness, difficulty swallowing , nasal congestion. CV: Negative for chest pain, angina, palpitations, dyspnea on exertion, peripheral edema.  Respiratory: Negative for dyspnea at rest, dyspnea on exertion, cough, sputum, wheezing.  GI: See history of present illness. GU:  Negative for dysuria, hematuria, urinary incontinence, urinary frequency, nocturnal urination.  Endo: Negative for unusual weight change.     Physical Exam   BP 118/80 (BP Location: Right Arm, Patient Position: Sitting, Cuff Size: Large)   Pulse 73   Temp 98.1 F (36.7 C) (Oral)   Ht 5' (1.524 m)   Wt 210 lb 3.2 oz (95.3 kg)  LMP 10/24/2022   SpO2 97%   BMI 41.05 kg/m    General: Well-nourished, well-developed in no acute distress.  Eyes: No icterus. Mouth: Oropharyngeal  mucosa moist and pink   Lungs: Clear to auscultation bilaterally.  Heart: Regular rate and rhythm, no murmurs rubs or gallops.  Abdomen: Bowel sounds are normal, nondistended, no hepatosplenomegaly or masses,  no abdominal bruits or hernia , no rebound or guarding. Mild luq tenderness Rectal: not performed Extremities: No lower extremity edema. No clubbing or deformities. Neuro: Alert and oriented x 4   Skin: Warm and dry, no jaundice.   Psych: Alert and cooperative, normal mood and affect.  Labs   See hpi Imaging Studies   MM 3D SCREEN BREAST BILATERAL  Result Date: 11/11/2022 CLINICAL DATA:  Screening. EXAM: DIGITAL SCREENING BILATERAL MAMMOGRAM WITH TOMOSYNTHESIS AND CAD TECHNIQUE: Bilateral screening digital craniocaudal and mediolateral oblique mammograms were obtained. Bilateral screening digital breast tomosynthesis was performed. The images were evaluated with computer-aided detection. COMPARISON:  Previous exam(s). ACR Breast Density Category a: The breast tissue is almost entirely fatty. FINDINGS: There are no findings suspicious for malignancy. IMPRESSION: No mammographic evidence of malignancy. A result letter of this screening mammogram will be mailed directly to the patient. RECOMMENDATION: Screening mammogram in one year. (Code:SM-B-01Y) BI-RADS CATEGORY  1: Negative. Electronically Signed   By: Marin Olp M.D.   On: 11/11/2022 08:51    Assessment   GERD: doing well on Dexilant.   Iron deficiency: EGD and colonoscopy completed last year. She denies significant menstrual bleeding. Her ferritin has dropped over the past one year. Celiac screen negative. We will check for persistent H.pylori. small bowel capsule endoscopy offered to complete work up for possible GI source for Iron deficiency. She will continue oral iron for now but may benefit from IV iron infusions if not helpful.   B12 deficiency: new since last year. Taking oral B12. Unclear etiology. Celiac screen  negative. Will make sure h.pylori adequate treated given gastritis on EGD. Pernicious anemia could be a possibility, could be evaluated for this by her PCP.   Fatty liver: LFTs are normal. Have encouraged weight loss and exercise in the past. At risk for NASH.   Fecal urgency/bloating: postprandial. Worse with dairy, rich, fatty foods. Celiac screen, colonoscopy unremarkable. Possible IBS or bile salt diarrhea. Recommend limit high fodmap foods.      PLAN   H.pylori stool test after coming off PPI for two weeks.  Small bowel capsule endoscopy to evaluate iron deficiency. Limit high fodmap foods, may be contributing to her bloating and bowel issues. List provided.  Based on work up, she may require further evaluation including CT imaging.   Laureen Ochs. Bobby Rumpf, [place], [facility]

## 2022-11-25 ENCOUNTER — Telehealth (INDEPENDENT_AMBULATORY_CARE_PROVIDER_SITE_OTHER): Payer: Self-pay | Admitting: *Deleted

## 2022-11-25 NOTE — Telephone Encounter (Signed)
Spoke with pt. Scheduled for capsule study 1/31 at 730am. Aware will send instructions via mychart

## 2022-11-29 DIAGNOSIS — H18522 Epithelial (juvenile) corneal dystrophy, left eye: Secondary | ICD-10-CM | POA: Diagnosis not present

## 2022-12-07 ENCOUNTER — Encounter: Payer: Self-pay | Admitting: *Deleted

## 2022-12-07 ENCOUNTER — Telehealth: Payer: Self-pay | Admitting: *Deleted

## 2022-12-07 NOTE — Telephone Encounter (Signed)
Pt says she has the capsule study tomorrow and had not received her instructions by MyChart. I resent the instructions to her today. She says she has taken the iron supplement one time in the last week and took Ibuprofen 2 days ago. She wants to know if this will be ok or will she need to reschedule. Please advise.Thank you

## 2022-12-07 NOTE — Telephone Encounter (Signed)
Pt called back and rescheduled for 12/15/22. New instructions sent to pt via MyChart

## 2022-12-07 NOTE — Telephone Encounter (Signed)
Pt was informed of providers message. She states that she wishes to reschedule because she wasn't prepared to begin the clear liquid diet today. Please advise. Thank you

## 2022-12-07 NOTE — Telephone Encounter (Signed)
Can you please mark on the schedule for me to read 12/16/22?

## 2022-12-07 NOTE — Telephone Encounter (Signed)
This has been added to your schedule

## 2022-12-07 NOTE — Telephone Encounter (Signed)
OK to proceed with capsule

## 2022-12-15 ENCOUNTER — Encounter (HOSPITAL_COMMUNITY): Payer: Self-pay | Admitting: Internal Medicine

## 2022-12-15 ENCOUNTER — Ambulatory Visit (HOSPITAL_COMMUNITY)
Admission: RE | Admit: 2022-12-15 | Discharge: 2022-12-15 | Disposition: A | Discharge status: Home / Self Care | Payer: Medicare Other | Attending: Internal Medicine | Admitting: Internal Medicine

## 2022-12-15 ENCOUNTER — Encounter (HOSPITAL_COMMUNITY)
Admission: RE | Disposition: A | Discharge status: Home / Self Care | Payer: Self-pay | Source: Home / Self Care | Attending: Internal Medicine

## 2022-12-15 DIAGNOSIS — D509 Iron deficiency anemia, unspecified: Secondary | ICD-10-CM | POA: Insufficient documentation

## 2022-12-15 HISTORY — PX: GIVENS CAPSULE STUDY: SHX5432

## 2022-12-15 SURGERY — IMAGING PROCEDURE, GI TRACT, INTRALUMINAL, VIA CAPSULE

## 2022-12-15 NOTE — H&P (Signed)
Patient presents for capsule endoscopy due to iron deficiency anemia

## 2022-12-17 DIAGNOSIS — D509 Iron deficiency anemia, unspecified: Secondary | ICD-10-CM | POA: Diagnosis not present

## 2022-12-17 DIAGNOSIS — K760 Fatty (change of) liver, not elsewhere classified: Secondary | ICD-10-CM | POA: Diagnosis not present

## 2022-12-17 DIAGNOSIS — R140 Abdominal distension (gaseous): Secondary | ICD-10-CM | POA: Diagnosis not present

## 2022-12-17 DIAGNOSIS — K219 Gastro-esophageal reflux disease without esophagitis: Secondary | ICD-10-CM | POA: Diagnosis not present

## 2022-12-19 LAB — H. PYLORI ANTIGEN, STOOL: H pylori Ag, Stl: NEGATIVE

## 2022-12-22 ENCOUNTER — Telehealth: Payer: Self-pay | Admitting: Gastroenterology

## 2022-12-22 NOTE — Telephone Encounter (Signed)
Unremarkable small bowel capsule study. Tiny polypoid mucosa in the stomach. No active bleeding, masses, ulcers. Nothing to explain IDA. Menstrual losses may be contributing. Recent H.pylori stool antigen negative.    Pertinent images reviewed with Dr. Abbey Chatters.    Continue oral iron.  Limit NSAIDs. Recheck CBC, iron/tibc/ferritin in 2 months.   Please make ov with Dr. Abbey Chatters to follow up GI concerns, 2 months.  Please arrange for the above labs in 2 months.

## 2022-12-22 NOTE — Op Note (Addendum)
   Small Bowel [place] Capsule Study Procedure date:  12/15/22  Referring Provider:  Laureen Ochs. Bernarda Caffey PCP:  Dr. Merryl Hacker, No  Indication for procedure:  IDA. EGD 12/2021 with gastritis. Colonoscopy 12/2021 with nonbleeding internal hemorrhoids.   Patient data:  Wt: 210 pounds Ht: 5 feet 0 inches  Findings: Capsule study complete, reaching colon in 52 minutes 58 seconds. She had a small polypoid lesion at 3 minutes 25 seconds in the stomach. No small bowel bleeding, masses, AVMs, ulcers noted.   First Gastric image:  54 seconds First Duodenal image: 8 minutes 1 second First Ileo-Cecal Valve image: 52 minutes 57 seconds First Cecal image: 52 minutes 58 seconds Gastric Passage time: 7 minutes Small Bowel Passage time:  44 minutes  Summary & Recommendations:  Unremarkable small bowel capsule study. Nothing to explain IDA. Menstrual losses may be contributing. Recent H.pylori stool antigen negative.   Pertinent images reviewed with Dr. Abbey Chatters.   Continue oral iron.  Limit NSAIDs. Recheck CBC, iron/tibc/ferritin in 2 months.   Laureen Ochs. Bernarda Caffey [facility] [phone] 2/14/20241:24 PM

## 2022-12-22 NOTE — Telephone Encounter (Signed)
Lmom for pt to return my call.  

## 2022-12-23 ENCOUNTER — Other Ambulatory Visit: Payer: Self-pay

## 2022-12-23 ENCOUNTER — Telehealth: Payer: Self-pay | Admitting: Internal Medicine

## 2022-12-23 DIAGNOSIS — D649 Anemia, unspecified: Secondary | ICD-10-CM

## 2022-12-23 NOTE — Telephone Encounter (Signed)
[name]/[name], please make pt an appt with Dr. Abbey Chatters after 02/20/2022.

## 2022-12-23 NOTE — Telephone Encounter (Signed)
Pt was made aware and verbalized understanding. Labs were ordered and will be mailed to the pt to have completed in two months.

## 2022-12-23 NOTE — Telephone Encounter (Signed)
See result note.  

## 2022-12-23 NOTE — Telephone Encounter (Signed)
Pt returning call from yesterday. [phone]

## 2023-01-12 ENCOUNTER — Encounter: Payer: Self-pay | Admitting: Adult Health

## 2023-01-12 ENCOUNTER — Ambulatory Visit (INDEPENDENT_AMBULATORY_CARE_PROVIDER_SITE_OTHER): Payer: Medicare Other | Admitting: Adult Health

## 2023-01-12 VITALS — BP 124/80 | HR 74 | Ht 60.0 in | Wt 218.0 lb

## 2023-01-12 DIAGNOSIS — Z0000 Encounter for general adult medical examination without abnormal findings: Secondary | ICD-10-CM

## 2023-01-12 DIAGNOSIS — Z1211 Encounter for screening for malignant neoplasm of colon: Secondary | ICD-10-CM | POA: Insufficient documentation

## 2023-01-12 DIAGNOSIS — Z01419 Encounter for gynecological examination (general) (routine) without abnormal findings: Secondary | ICD-10-CM

## 2023-01-12 NOTE — Progress Notes (Signed)
Patient ID: Teresa Tran, female   DOB: [birth year], 42 y.o.   MRN: [id number] History of Present Illness: Danett is a 42 year old white female, married, [id number], in for well woman gyn exam.  Last pap was negative HPV and NILM 11/12/17/20.  PCP is Vernie Murders MD at [facility]   Current Medications, Allergies, Past Medical History, Past Surgical History, Family History and Social History were reviewed in [employer] record.     Review of Systems: Patient denies any headaches, hearing loss,  blurred vision, shortness of breath, chest pain, abdominal pain, problems with bowel movements, urination, or intercourse. No joint pain or mood swings.  She is fatigued and is on iron, has pain in right side of neck at times.   Physical Exam:BP 124/80 (BP Location: Left Arm, Patient Position: Sitting, Cuff Size: Large)   Pulse 74   Ht 5' (1.524 m)   Wt 218 lb (98.9 kg)   LMP 12/25/2022   BMI 42.58 kg/m   General:  Well developed, well nourished, no acute distress Skin:  Warm and dry Neck:  Midline trachea, normal thyroid, good ROM, no lymphadenopathy Lungs; Clear to auscultation bilaterally Breast:  No dominant palpable mass, retraction, or nipple discharge Cardiovascular: Regular rate and rhythm Abdomen:  Soft, non tender, no hepatosplenomegaly Pelvic:  External genitalia is normal in appearance, no lesions.  The vagina is normal in appearance. Urethra has no lesions or masses. The cervix is bulbous.  Uterus is felt to be normal size, shape, and contour.  No adnexal masses or tenderness noted.Bladder is non tender, no masses felt. Rectal: Good sphincter tone, no polyps, or hemorrhoids felt.  Hemoccult negative Extremities/musculoskeletal:  No swelling or varicosities noted, no clubbing or cyanosis Psych:  No mood changes, alert and cooperative,seems happy AA is 0 Fall risk is low    01/12/2023    8:47 AM 10/06/2021    2:54 PM 12/25/2019   10:19 AM   Depression screen PHQ 2/9  Decreased Interest 0 0 0  Down, Depressed, Hopeless 0 0 0  PHQ - 2 Score 0 0 0  Altered sleeping 0 0   Tired, decreased energy 1 0   Change in appetite 0 0   Feeling bad or failure about yourself  0 0   Trouble concentrating 0 0   Moving slowly or fidgety/restless 0 0   Suicidal thoughts 0 0   PHQ-9 Score 1 0        01/12/2023    8:47 AM 10/06/2021    2:54 PM 12/17/2019   10:53 AM  GAD 7 : Generalized Anxiety Score  Nervous, Anxious, on Edge 0 0 0  Control/stop worrying 0 0 0  Worry too much - different things 0 0 0  Trouble relaxing 0 0 0  Restless 0 0 0  Easily annoyed or irritable 0 0 0  Afraid - awful might happen 0 0 0  Total GAD 7 Score 0 0 0      Upstream - 01/12/23 0842       Pregnancy Intention Screening   Does the patient want to become pregnant in the next year? No    Does the patient's partner want to become pregnant in the next year? No    Would the patient like to discuss contraceptive options today? No      Contraception Wrap Up   Current Method Female Sterilization    End Method Female Sterilization    Contraception Counseling Provided No  Examination chaperoned by Alvera Singh NP student and she assisted with exam   Impression and Plan: 1. Encounter for well woman exam with routine gynecological exam Pap and physical in 1 year Labs with pap  Negative mammogram 11/10/22 Colonoscopy per GI had one in 2023

## 2023-01-13 ENCOUNTER — Encounter: Payer: Self-pay | Admitting: Internal Medicine

## 2023-02-16 ENCOUNTER — Other Ambulatory Visit: Payer: Self-pay

## 2023-02-16 DIAGNOSIS — D649 Anemia, unspecified: Secondary | ICD-10-CM

## 2023-02-25 DIAGNOSIS — D649 Anemia, unspecified: Secondary | ICD-10-CM | POA: Diagnosis not present

## 2023-02-26 LAB — IRON,TIBC AND FERRITIN PANEL
Ferritin: 35 ng/mL (ref 15–150)
Iron Saturation: 16 % (ref 15–55)
Iron: 50 ug/dL (ref 27–159)
Total Iron Binding Capacity: 308 ug/dL (ref 250–450)
UIBC: 258 ug/dL (ref 131–425)

## 2023-02-26 LAB — CBC WITH DIFFERENTIAL/PLATELET
Basophils Absolute: 0 10*3/uL (ref 0.0–0.2)
Basos: 1 %
EOS (ABSOLUTE): 0.1 10*3/uL (ref 0.0–0.4)
Eos: 2 %
Hematocrit: 37.6 % (ref 34.0–46.6)
Hemoglobin: 12.2 g/dL (ref 11.1–15.9)
Immature Grans (Abs): 0 10*3/uL (ref 0.0–0.1)
Immature Granulocytes: 0 %
Lymphocytes Absolute: 1.9 10*3/uL (ref 0.7–3.1)
Lymphs: 32 %
MCH: 27.7 pg (ref 26.6–33.0)
MCHC: 32.4 g/dL (ref 31.5–35.7)
MCV: 86 fL (ref 79–97)
Monocytes Absolute: 0.4 10*3/uL (ref 0.1–0.9)
Monocytes: 7 %
Neutrophils Absolute: 3.5 10*3/uL (ref 1.4–7.0)
Neutrophils: 58 %
Platelets: 227 10*3/uL (ref 150–450)
RBC: 4.4 x10E6/uL (ref 3.77–5.28)
RDW: 13.9 % (ref 11.7–15.4)
WBC: 5.9 10*3/uL (ref 3.4–10.8)

## 2023-03-16 ENCOUNTER — Ambulatory Visit: Payer: Medicare Other | Admitting: Gastroenterology

## 2023-03-17 ENCOUNTER — Telehealth: Payer: Self-pay | Admitting: *Deleted

## 2023-03-17 ENCOUNTER — Ambulatory Visit (INDEPENDENT_AMBULATORY_CARE_PROVIDER_SITE_OTHER): Payer: Medicare Other | Admitting: Internal Medicine

## 2023-03-17 ENCOUNTER — Encounter: Payer: Self-pay | Admitting: Internal Medicine

## 2023-03-17 ENCOUNTER — Other Ambulatory Visit: Payer: Self-pay | Admitting: *Deleted

## 2023-03-17 VITALS — BP 119/82 | HR 75 | Temp 97.6°F | Ht 60.0 in | Wt 217.4 lb

## 2023-03-17 DIAGNOSIS — R140 Abdominal distension (gaseous): Secondary | ICD-10-CM | POA: Diagnosis not present

## 2023-03-17 DIAGNOSIS — R195 Other fecal abnormalities: Secondary | ICD-10-CM

## 2023-03-17 DIAGNOSIS — D649 Anemia, unspecified: Secondary | ICD-10-CM | POA: Diagnosis not present

## 2023-03-17 DIAGNOSIS — K760 Fatty (change of) liver, not elsewhere classified: Secondary | ICD-10-CM | POA: Diagnosis not present

## 2023-03-17 DIAGNOSIS — K219 Gastro-esophageal reflux disease without esophagitis: Secondary | ICD-10-CM

## 2023-03-17 DIAGNOSIS — R1031 Right lower quadrant pain: Secondary | ICD-10-CM | POA: Diagnosis not present

## 2023-03-17 NOTE — Progress Notes (Signed)
Referring Provider: No ref. provider found Primary Care Physician:  Pcp, No Primary GI:  Dr. Marletta Lor  Chief Complaint  Patient presents with   Follow-up    Follow up on GI concerns, fatigue, anemia    HPI:   Teresa Tran is a 42 y.o. female who presents to the clinic today for follow up visit. She has a history of fatty liver, GERD, autoimmune disorders, fecal urgency/bloating. She has a history of mild normocytic anemia. Celiac serologies negative in January 2023. Treated for H. pylori in 2015.   Regular menstrual cycles, heavier than in the past but does not feel they are very heavy. No clots. Heaviest days are the first three. Bleeds for 5 days total. Denies melena, brbpr.   Takes Dexilant for chronic reflux. Has a lot of bloated in the upper abdomen. Seems to be more intolerant to dairy. Milk causes diarrhea. Tries almond milk now. On regular days, about 2 stools per day. Certain foods may make go more often. No melena, brbpr. Complains of lower back pain after meals, can last couple of hours. Certain foods trigger the back pain such a heavier meal, pasta, bread.   Colonoscopy February 2023: -Nonbleeding internal hemorrhoids -Next colonoscopy 10 years   EGD February 2023: -Gastritis -no biopsy   Abdominal ultrasound October 2021: IMPRESSION: 1. Status post cholecystectomy. No intra or extrahepatic biliary dilatation. 2. Diffuse fatty infiltration of the liver but no focal hepatic lesions. 3. Limited visualization the pancreas. 4. The spleen and kidneys are unremarkable.   Past Medical History:  Diagnosis Date   Allergy    medicine   Anemia    BV (bacterial vaginosis) 08/15/2014   CSF leak    Diverticulitis    Encounter for well woman exam with routine gynecological exam 12/25/2019   GERD (gastroesophageal reflux disease)    Headache(784.0)    Hypertension    Low back pain 08/15/2014   Lupus (HCC)    Obesity    [facility] spotted fever 04/2016   Ulcer     Urinary frequency 08/30/2014   Vaginal discharge 08/15/2014   Vaginal irritation 02/12/2014   Yeast infection 02/12/2014    Past Surgical History:  Procedure Laterality Date   CHOLECYSTECTOMY  Feb 2015   Dr. Marcha Solders, [facility]   COLONOSCOPY WITH PROPOFOL N/A 12/28/2021   Procedure: COLONOSCOPY WITH PROPOFOL;  Surgeon: Lanelle Bal, DO;  [facility];  Service: Endoscopy;  Laterality: N/A;  9:00am   ENDOSCOPIC CONCHA BULLOSA RESECTION Left 07/20/2016   Procedure: ENDOSCOPIC LEFT  CONCHA BULLOSA RESECTION;  Surgeon: Newman Pies, MD;  Location: [facility];  Service: ENT;  Laterality: Left;   ESOPHAGOGASTRODUODENOSCOPY N/A 06/11/2014   WUJ:WJXB DUODENITIS/MODERATE EROSIVE GASTRICTIS IN ANTRUM/PARTIALLY HEALED ULCERS   ESOPHAGOGASTRODUODENOSCOPY N/A 05/06/2015   Procedure: ESOPHAGOGASTRODUODENOSCOPY (EGD);  Surgeon: West Bali, MD;  [facility];  Service: Endoscopy;  Laterality: N/A;  1300 - moved to 1:15 - office to notify   ESOPHAGOGASTRODUODENOSCOPY (EGD) WITH ESOPHAGEAL DILATION N/A 02/11/2014   Dr. Darrick Penna: probable proximal esophageal web, PUD, duodenitis, negative H.pylori    ESOPHAGOGASTRODUODENOSCOPY (EGD) WITH PROPOFOL N/A 12/28/2021   Procedure: ESOPHAGOGASTRODUODENOSCOPY (EGD) WITH PROPOFOL;  Surgeon: Lanelle Bal, DO;  [facility];  Service: Endoscopy;  Laterality: N/A;   EXCISION CHONCHA BULLOSA N/A 07/20/2016   Procedure: NASAL SEPTOPLASTY WITH BILATERAL TURBINATE REDUCTION;  Surgeon: Newman Pies, MD;  Location: [facility];  Service: ENT;  Laterality: N/A;   GIVENS CAPSULE  STUDY N/A 12/15/2022   Procedure: GIVENS CAPSULE STUDY;  Surgeon: Lanelle Bal, DO;  [facility];  Service: Endoscopy;  Laterality: N/A;  730am   RHINOPLASTY     SAVORY DILATION N/A 05/06/2015   Procedure: SAVORY DILATION;  Surgeon: West Bali, MD;  [facility];  Service: Endoscopy;  Laterality: N/A;   TUBAL LIGATION       Current Outpatient Medications  Medication Sig Dispense Refill   acetaminophen (TYLENOL) 500 MG tablet Take 1,000 mg by mouth every 6 (six) hours as needed for mild pain or headache.     chlorthalidone (HYGROTON) 25 MG tablet Take 1 tablet (25 mg total) by mouth daily. (NEEDS TO BE SEEN BEFORE NEXT REFILL) 30 tablet 0   cholecalciferol (VITAMIN D3) 25 MCG (1000 UNIT) tablet Take 1,000 Units by mouth daily.     DEXILANT 60 MG capsule TAKE 1 CAPSULE BY MOUTH ONCE DAILY BEFORE BREAKFAST 30 capsule 11   cyanocobalamin (VITAMIN B12) 1000 MCG tablet Take 1 tablet by mouth daily. (Patient not taking: Reported on 03/17/2023)     ferrous sulfate 325 (65 FE) MG tablet Take 325 mg by mouth daily with breakfast. (Patient not taking: Reported on 03/17/2023)     No current facility-administered medications for this visit.    Allergies as of 03/17/2023 - Review Complete 03/17/2023  Allergen Reaction Noted   Macrobid [nitrofurantoin monohyd macro] Itching 01/09/2014    Family History  Problem Relation Age of Onset   Depression Father    Anxiety disorder Father    Diabetes Father    Hypertension Father    Hyperlipidemia Father    Kidney disease Father    Mental illness Father    Pneumonia Father    Stroke Father    Depression Sister    Anxiety disorder Sister    Cancer Sister        cervical   Stroke Sister    Depression Brother    Anxiety disorder Brother    Cancer - Other Paternal Grandfather            Cancer Paternal Grandfather        prostate   Kidney disease Mother    Diabetes Mother    Cancer Mother        kidney   Hyperlipidemia Mother    Hypertension Mother    Stroke Mother    Aneurysm Maternal Grandfather    Cancer Maternal Grandmother 76       breast   Cancer Paternal Grandmother        bladder   ADD / ADHD Neg Hx    Alcohol abuse Neg Hx    Drug abuse Neg Hx    Bipolar disorder Neg Hx    Dementia Neg Hx    OCD Neg Hx    Paranoid behavior Neg Hx    Schizophrenia  Neg Hx    Seizures Neg Hx    Sexual abuse Neg Hx    Physical abuse Neg Hx    Colon cancer Neg Hx     Social History   Socioeconomic History   Marital status: Married    Spouse name: Riley Lam   Number of children: 2   Years of education: 16   Highest education level: Bachelor's degree (e.g., BA, AB, BS)  Occupational History   Occupation: homehealth  Tobacco Use   Smoking status: Never   Smokeless tobacco: Never  Vaping Use   Vaping Use: Never used  Substance and Sexual  Activity   Alcohol use: No    Alcohol/week: 0.0 standard drinks of alcohol   Drug use: No   Sexual activity: Yes    Birth control/protection: Surgical    Comment: tubal  Other Topics Concern   Not on file  Social History Narrative   Working on Scientist, water quality in Radio broadcast assistant   Lives with husband and 2 children   One cat   Active in church   Social Determinants of Health   Financial Resource Strain: Low Risk  (01/12/2023)   Overall Financial Resource Strain (CARDIA)    Difficulty of Paying Living Expenses: Not very hard  Food Insecurity: No Food Insecurity (01/12/2023)   Hunger Vital Sign    Worried About Running Out of Food in the Last Year: Never true    Ran Out of Food in the Last Year: Never true  Transportation Needs: No Transportation Needs (01/12/2023)   PRAPARE - Administrator, Civil Service (Medical): No    Lack of Transportation (Non-Medical): No  Physical Activity: Insufficiently Active (01/12/2023)   Exercise Vital Sign    Days of Exercise per Week: 1 day    Minutes of Exercise per Session: 30 min  Stress: No Stress Concern Present (01/12/2023)   [employer] of Occupational Health - Occupational Stress Questionnaire    Feeling of Stress : Not at all  Social Connections: Socially Integrated (01/12/2023)   Social Connection and Isolation Panel [NHANES]    Frequency of Communication with Friends and Family: Three times a week    Frequency of Social Gatherings with Friends and  Family: Twice a week    Attends Religious Services: More than 4 times per year    Active Member of [employer] or Organizations: Yes    Attends Engineer, structural: More than 4 times per year    Marital Status: Married    Subjective: Review of Systems  Constitutional:  Negative for chills and fever.  HENT:  Negative for congestion and hearing loss.   Eyes:  Negative for blurred vision and double vision.  Respiratory:  Negative for cough and shortness of breath.   Cardiovascular:  Negative for chest pain and palpitations.  Gastrointestinal:  Positive for abdominal pain and heartburn. Negative for blood in stool, constipation, diarrhea, melena and vomiting.  Genitourinary:  Negative for dysuria and urgency.  Musculoskeletal:  Negative for joint pain and myalgias.  Skin:  Negative for itching and rash.  Neurological:  Negative for dizziness and headaches.  Psychiatric/Behavioral:  Negative for depression. The patient is not nervous/anxious.      Objective: BP 119/82   Pulse 75   Temp 97.6 F (36.4 C)   Ht 5' (1.524 m)   Wt 217 lb 6.4 oz (98.6 kg)   LMP 02/26/2023   BMI 42.46 kg/m  Physical Exam Constitutional:      Appearance: Normal appearance. She is obese.  HENT:     Head: Normocephalic and atraumatic.  Eyes:     Extraocular Movements: Extraocular movements intact.     Conjunctiva/sclera: Conjunctivae normal.  Cardiovascular:     Rate and Rhythm: Normal rate and regular rhythm.  Pulmonary:     Effort: Pulmonary effort is normal.     Breath sounds: Normal breath sounds.  Abdominal:     General: Bowel sounds are normal.     Palpations: Abdomen is soft.  Musculoskeletal:        General: No swelling. Normal range of motion.     Cervical back: Normal range  of motion and neck supple.  Skin:    General: Skin is warm and dry.     Coloration: Skin is not jaundiced.  Neurological:     General: No focal deficit present.     Mental Status: She is alert and oriented to  person, place, and time.  Psychiatric:        Mood and Affect: Mood normal.        Behavior: Behavior normal.      Assessment/Plan:  1.  Abdominal pain, bloating, fecal urgency-patient to avoid dairy.  Celiac testing negative.  Colonoscopy unremarkable.  Will print out low FODMAP diet today.  Gas-X as needed.  Given worsening abdominal pain will order CT abdomen pelvis with contrast to further evaluate.  Will call with results.  2.  Iron deficiency anemia-significant workup with EGD, capsule endoscopy, colonoscopy, all largely WNL.  H. pylori negative.  Iron levels improved.  Continue to monitor.  3.  Nonalcoholic fatty liver disease-LFTs WNL.  Continue to monitor. Recommend 1-2# weight loss per week until ideal body weight through exercise & diet. Low fat/cholesterol diet.   Avoid sweets, sodas, fruit juices, sweetened beverages like tea, etc. Gradually increase exercise from 15 min daily up to 1 hr per day 5 days/week. Limit alcohol use.  4.  Chronic GERD-well-controlled on Dexilant.  Will continue.  Follow-up in 3 months  03/17/2023 11:42 AM   Disclaimer: This note was dictated with voice recognition software. Similar sounding words can inadvertently be transcribed and may not be corrected upon review.

## 2023-03-17 NOTE — Patient Instructions (Signed)
I am going to order a CT abdomen pelvis to further evaluate your chronic abdominal pain and bloating.  I want to start taking over-the-counter simethicone (Gas-X) as needed for bloating.  I will give you a low FODMAP diet today which may give you some insight on certain foods to avoid.  Follow-up in 2 to 3 months.  It was very nice seeing you again today.  Dr. Marletta Lor

## 2023-03-17 NOTE — Telephone Encounter (Signed)
[facility], [place]  CT scheduled for 03/30/23, Wednesday, need to arrive at 5:15 pm to check in at [facility], NPO 4 hours prior

## 2023-03-30 ENCOUNTER — Other Ambulatory Visit (HOSPITAL_COMMUNITY): Payer: Medicare Other

## 2023-04-06 ENCOUNTER — Ambulatory Visit (HOSPITAL_COMMUNITY)
Admission: RE | Admit: 2023-04-06 | Discharge: 2023-04-06 | Disposition: A | Discharge status: Home / Self Care | Payer: Medicare Other | Source: Ambulatory Visit | Attending: Internal Medicine | Admitting: Internal Medicine

## 2023-04-06 DIAGNOSIS — R1031 Right lower quadrant pain: Secondary | ICD-10-CM | POA: Diagnosis not present

## 2023-04-06 MED ORDER — IOHEXOL 300 MG/ML  SOLN
100.0000 mL | Freq: Once | INTRAMUSCULAR | Status: AC | PRN
  Administered 2023-04-06: 100 mL via INTRAVENOUS

## 2023-04-29 DIAGNOSIS — M25562 Pain in left knee: Secondary | ICD-10-CM | POA: Diagnosis not present

## 2023-05-24 ENCOUNTER — Encounter: Payer: Self-pay | Admitting: Internal Medicine

## 2023-07-10 DIAGNOSIS — Z23 Encounter for immunization: Secondary | ICD-10-CM | POA: Diagnosis not present

## 2023-07-12 ENCOUNTER — Other Ambulatory Visit: Payer: Self-pay | Admitting: Gastroenterology

## 2023-08-14 DIAGNOSIS — Z23 Encounter for immunization: Secondary | ICD-10-CM | POA: Diagnosis not present

## 2023-09-04 ENCOUNTER — Ambulatory Visit
Admission: EM | Admit: 2023-09-04 | Discharge: 2023-09-04 | Disposition: A | Discharge status: Home / Self Care | Payer: Medicare Other | Attending: Neurology | Admitting: Neurology

## 2023-09-04 DIAGNOSIS — K219 Gastro-esophageal reflux disease without esophagitis: Secondary | ICD-10-CM | POA: Diagnosis not present

## 2023-09-04 DIAGNOSIS — R052 Subacute cough: Secondary | ICD-10-CM

## 2023-09-04 MED ORDER — PROMETHAZINE-DM 6.25-15 MG/5ML PO SYRP: 5.0000 mL | ORAL_SOLUTION | Freq: Four times a day (QID) | ORAL | 0 refills | Status: DC | PRN

## 2023-09-04 MED ORDER — PANTOPRAZOLE SODIUM 20 MG PO TBEC: 20.0000 mg | DELAYED_RELEASE_TABLET | Freq: Every day | ORAL | 0 refills | Status: DC

## 2023-09-04 MED ORDER — FAMOTIDINE 20 MG PO TABS: 20.0000 mg | ORAL_TABLET | Freq: Two times a day (BID) | ORAL | 0 refills | Status: DC

## 2023-09-04 NOTE — ED Provider Notes (Signed)
[facility]    CSN: [id number] Arrival date & time: 09/04/23  1332      History   Chief Complaint No chief complaint on file.   HPI Teresa Tran is a 42 y.o. female.   Teresa Tran presents with a cough for approximately 2 weeks. She has started to feel like she has stuff she could cough up. Additionally she has not been able to take her Dexilant for the last couple of weeks due to insurance issues.  Her GERD is typically well-controlled with the Dexilant. She follows closely with her GI doctor but has not hear back from them since she reached out about her insurance issues. She has noticed that her cough symptoms have worsened over the last week and she has had to sleep sitting up on the couch. Tessalon pearls do not work for her. She has been using over the counter cough drops with minimal relief. She denies sputum, fever, or difficulty breathing. She has not had any sick contacts and she is not around kids. She is hear today to make sure she doesn't have an upper respiratory infection since she is planning on traveling soon.      Past Medical History:  Diagnosis Date   Allergy    medicine   Anemia    BV (bacterial vaginosis) 08/15/2014   CSF leak    Diverticulitis    Encounter for well woman exam with routine gynecological exam 12/25/2019   GERD (gastroesophageal reflux disease)    Headache(784.0)    Hypertension    Low back pain 08/15/2014   Lupus    Obesity    [place] spotted fever 04/2016   Ulcer    Urinary frequency 08/30/2014   Vaginal discharge 08/15/2014   Vaginal irritation 02/12/2014   Yeast infection 02/12/2014    Patient Active Problem List   Diagnosis Date Noted   Encounter for screening fecal occult blood testing 01/12/2023   IDA (iron deficiency anemia) 11/24/2022   Elevated lipase 11/11/2021   Bloating 11/11/2021   Fatty liver 01/22/2020   Autoimmune disorder (HCC) 01/22/2020   Encounter for well woman exam with routine  gynecological exam 12/25/2019   Mass of upper inner quadrant of left breast 12/25/2019   Infection of skin and subcutaneous tissue due to fungus 12/25/2019   Yeast vaginitis 09/14/2019   Vaginal irritation 09/14/2019   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   Loose stools 12/07/2018   Overflow incontinence of urine 07/24/2018   Labial skin tag 07/24/2018   Absence of bladder continence 07/24/2018   Prediabetes 06/12/2018   Liver lesion 09/07/2017   Proteinuria 09/07/2017   Essential hypertension 09/01/2017   Migraine 06/21/2017   Chronic fatigue 06/21/2017   Obesity (BMI 30.0-34.9) 06/21/2017   GERD (gastroesophageal reflux disease) 12/22/2016   Uterine leiomyoma 12/02/2016   Menorrhagia with irregular cycle 12/02/2016   Normocytic anemia 09/11/2014   PUD (peptic ulcer disease) 06/06/2014   Vitamin D deficiency 02/19/2013   PTSD (post-traumatic stress disorder) 03/23/2012    Past Surgical History:  Procedure Laterality Date   CHOLECYSTECTOMY  Feb 2015   Dr. Marcha Solders, [facility]   COLONOSCOPY WITH PROPOFOL N/A 12/28/2021   Procedure: COLONOSCOPY WITH PROPOFOL;  Surgeon: Lanelle Bal, DO;  [facility];  Service: Endoscopy;  Laterality: N/A;  9:00am   ENDOSCOPIC CONCHA BULLOSA RESECTION Left 07/20/2016   Procedure: ENDOSCOPIC LEFT  CONCHA BULLOSA RESECTION;  Surgeon: Newman Pies, MD;  Location: [facility];  Service: ENT;  Laterality: Left;   ESOPHAGOGASTRODUODENOSCOPY N/A 06/11/2014   WUJ:WJXB DUODENITIS/MODERATE EROSIVE GASTRICTIS IN ANTRUM/PARTIALLY HEALED ULCERS   ESOPHAGOGASTRODUODENOSCOPY N/A 05/06/2015   Procedure: ESOPHAGOGASTRODUODENOSCOPY (EGD);  Surgeon: West Bali, MD;  [facility];  Service: Endoscopy;  Laterality: N/A;  1300 - moved to 1:15 - office to notify   ESOPHAGOGASTRODUODENOSCOPY (EGD) WITH ESOPHAGEAL DILATION N/A 02/11/2014   Dr. Darrick Penna: probable proximal esophageal web, PUD, duodenitis,  negative H.pylori    ESOPHAGOGASTRODUODENOSCOPY (EGD) WITH PROPOFOL N/A 12/28/2021   Procedure: ESOPHAGOGASTRODUODENOSCOPY (EGD) WITH PROPOFOL;  Surgeon: Lanelle Bal, DO;  [facility];  Service: Endoscopy;  Laterality: N/A;   EXCISION CHONCHA BULLOSA N/A 07/20/2016   Procedure: NASAL SEPTOPLASTY WITH BILATERAL TURBINATE REDUCTION;  Surgeon: Newman Pies, MD;  Location: [facility];  Service: ENT;  Laterality: N/A;   GIVENS CAPSULE STUDY N/A 12/15/2022   Procedure: GIVENS CAPSULE STUDY;  Surgeon: Lanelle Bal, DO;  [facility];  Service: Endoscopy;  Laterality: N/A;  730am   RHINOPLASTY     SAVORY DILATION N/A 05/06/2015   Procedure: SAVORY DILATION;  Surgeon: West Bali, MD;  [facility];  Service: Endoscopy;  Laterality: N/A;   TUBAL LIGATION      OB History     Gravida  2   Para  2   Term  1   Preterm  1   AB      Living  2      SAB      IAB      Ectopic      Multiple      Live Births  2            Home Medications    Prior to Admission medications   Medication Sig Start Date End Date Taking? Authorizing Provider  famotidine (PEPCID) 20 MG tablet Take 1 tablet (20 mg total) by mouth 2 (two) times daily. 09/04/23  Yes Quandarius Nill, Ludger Nutting, NP  pantoprazole (PROTONIX) 20 MG tablet Take 1 tablet (20 mg total) by mouth daily. 09/04/23  Yes Bobbye Reinitz, Ludger Nutting, NP  promethazine-dextromethorphan (PROMETHAZINE-DM) 6.25-15 MG/5ML syrup Take 5 mLs by mouth 4 (four) times daily as needed for cough. 09/04/23  Yes Elmer Picker, NP  acetaminophen (TYLENOL) 500 MG tablet Take 1,000 mg by mouth every 6 (six) hours as needed for mild pain or headache.    [provider]  chlorthalidone (HYGROTON) 25 MG tablet Take 1 tablet (25 mg total) by mouth daily. (NEEDS TO BE SEEN BEFORE NEXT REFILL) 02/23/21   Dettinger, Elige Radon, MD  cholecalciferol (VITAMIN D3) 25 MCG (1000 UNIT) tablet Take 1,000 Units by mouth daily.    [provider]  cyanocobalamin (VITAMIN B12) 1000 MCG tablet Take 1 tablet by mouth daily. Patient not taking: Reported on 03/17/2023 10/15/22   [provider]  DEXILANT 60 MG capsule TAKE 1 CAPSULE BY MOUTH ONCE DAILY BEFORE BREAKFAST 07/12/23   Lanelle Bal, DO  ferrous sulfate 325 (65 FE) MG tablet Take 325 mg by mouth daily with breakfast. Patient not taking: Reported on 03/17/2023    [provider]    Family History Family History  Problem Relation Age of Onset   Depression Father    Anxiety disorder Father    Diabetes Father    Hypertension Father    Hyperlipidemia Father    Kidney disease Father    Mental illness Father    Pneumonia Father  Stroke Father    Depression Sister    Anxiety disorder Sister    Cancer Sister        cervical   Stroke Sister    Depression Brother    Anxiety disorder Brother    Cancer - Other Paternal Grandfather            Cancer Paternal Grandfather        prostate   Kidney disease Mother    Diabetes Mother    Cancer Mother        kidney   Hyperlipidemia Mother    Hypertension Mother    Stroke Mother    Aneurysm Maternal Grandfather    Cancer Maternal Grandmother 74       breast   Cancer Paternal Grandmother        bladder   ADD / ADHD Neg Hx    Alcohol abuse Neg Hx    Drug abuse Neg Hx    Bipolar disorder Neg Hx    Dementia Neg Hx    OCD Neg Hx    Paranoid behavior Neg Hx    Schizophrenia Neg Hx    Seizures Neg Hx    Sexual abuse Neg Hx    Physical abuse Neg Hx    Colon cancer Neg Hx     Social History Social History   Tobacco Use   Smoking status: Never   Smokeless tobacco: Never  Vaping Use   Vaping status: Never Used  Substance Use Topics   Alcohol use: No    Alcohol/week: 0.0 standard drinks of alcohol   Drug use: No     Allergies   Macrobid [nitrofurantoin monohyd macro]   Review of Systems Review of Systems  Respiratory:  Positive for cough.      Physical Exam Triage Vital  Signs ED Triage Vitals  Encounter Vitals Group     BP 09/04/23 1339 131/85     Systolic BP Percentile --      Diastolic BP Percentile --      Pulse Rate 09/04/23 1339 91     Resp 09/04/23 1339 18     Temp 09/04/23 1339 98.8 F (37.1 C)     Temp Source 09/04/23 1339 Oral     SpO2 09/04/23 1339 95 %     Weight --      Height --      Head Circumference --      Peak Flow --      Pain Score 09/04/23 1341 0     Pain Loc --      Pain Education --      Exclude from Growth Chart --    No data found.  Updated Vital Signs BP 131/85 (BP Location: Right Arm)   Pulse 91   Temp 98.8 F (37.1 C) (Oral)   Resp 18   LMP 09/04/2023 Comment: end date  SpO2 95%   Visual Acuity Right Eye Distance:   Left Eye Distance:   Bilateral Distance:    Right Eye Near:   Left Eye Near:    Bilateral Near:     Physical Exam   UC Treatments / Results  Labs (all labs ordered are listed, but only abnormal results are displayed) Labs Reviewed - No data to display  EKG   Radiology No results found.  Procedures Procedures (including critical care time)  Medications Ordered in UC Medications - No data to display  Initial Impression / Assessment and Plan / UC Course  I have reviewed  the triage vital signs and the nursing notes.  Pertinent labs & imaging results that were available during my care of the patient were reviewed by me and considered in my medical decision making (see chart for details).   Exam is consistent with GERD exacerbation. Exam is not concerning for upper respiratory infection as her lungs sound clear and she does not have a fever, a productive cough, and difficulty breathing. Imaging and viral testing deferred. Symptoms management with Protonix and famotidine until she is able to see her PCP or GI team. Additionally promethazine DM sent for cough at night.        Final Clinical Impressions(s) / UC Diagnoses   Final diagnoses:  Gastroesophageal reflux disease,  unspecified whether esophagitis present  Subacute cough     Discharge Instructions      Follow-up with GI for long-term management of GERD.  Recommend famotidine and pantoprazole in the short-term until you are able to follow-up with your GI team.  If respiratory symptoms become worse you develop a fever or be given having a productive cough please return to urgent care for additional management.  At this time your exam is not concerning for an upper respiratory infection or pneumonia.     ED Prescriptions     Medication Sig Dispense Auth. Provider   famotidine (PEPCID) 20 MG tablet Take 1 tablet (20 mg total) by mouth 2 (two) times daily. 30 tablet Elmer Picker, NP   pantoprazole (PROTONIX) 20 MG tablet Take 1 tablet (20 mg total) by mouth daily. 30 tablet Elmer Picker, NP   promethazine-dextromethorphan (PROMETHAZINE-DM) 6.25-15 MG/5ML syrup Take 5 mLs by mouth 4 (four) times daily as needed for cough. 180 mL Elmer Picker, NP      PDMP not reviewed this encounter.   Elmer Picker, NP 09/04/23 1421

## 2023-09-04 NOTE — Discharge Instructions (Addendum)
Follow-up with GI for long-term management of GERD.  Recommend famotidine and pantoprazole in the short-term until you are able to follow-up with your GI team.  If respiratory symptoms become worse you develop a fever or be given having a productive cough please return to urgent care for additional management.  At this time your exam is not concerning for an upper respiratory infection or pneumonia.

## 2023-09-04 NOTE — ED Triage Notes (Signed)
Pt reports she has had a cough x 2 weeks has mid chest discomfort

## 2023-09-07 ENCOUNTER — Ambulatory Visit
Admission: EM | Admit: 2023-09-07 | Discharge: 2023-09-07 | Disposition: A | Discharge status: Home / Self Care | Payer: Medicare Other | Attending: Nurse Practitioner | Admitting: Nurse Practitioner

## 2023-09-07 ENCOUNTER — Encounter: Payer: Self-pay | Admitting: Emergency Medicine

## 2023-09-07 ENCOUNTER — Other Ambulatory Visit: Payer: Self-pay

## 2023-09-07 DIAGNOSIS — R059 Cough, unspecified: Secondary | ICD-10-CM

## 2023-09-07 DIAGNOSIS — R21 Rash and other nonspecific skin eruption: Secondary | ICD-10-CM | POA: Diagnosis not present

## 2023-09-07 MED ORDER — PSEUDOEPH-BROMPHEN-DM 30-2-10 MG/5ML PO SYRP: 5.0000 mL | ORAL_SOLUTION | Freq: Four times a day (QID) | ORAL | 0 refills | Status: DC | PRN

## 2023-09-07 MED ORDER — CETIRIZINE HCL 10 MG PO TABS: 10.0000 mg | ORAL_TABLET | Freq: Every day | ORAL | 0 refills | Status: DC

## 2023-09-07 NOTE — ED Provider Notes (Signed)
[facility]    CSN: [id number] Arrival date & time: 09/07/23  0846      History   Chief Complaint Chief Complaint  Patient presents with   Cough    HPI Teresa Tran is a 42 y.o. female.   The history is provided by the patient.   Patient presents for follow-up for complaints of continued cough.  Patient states cough originally started approximately 2 weeks ago.  She states that cough has remained persistent.  She states that in the morning, she feels like she has more drainage in the back of her throat.  She states that the cough makes her voice sounded more "frog" she reports she was prescribed a cough medicine when she was seen in this clinic previously, but states she did not start the medicine because the pharmacy told her it would make her drowsy.  She reports that she is getting ready to travel, and she did not want to take any medication that would make her drowsy.  She reports that she does have a history of reflux, and she has been taking the famotidine and Protonix that she has been prescribed.  Patient continues to deny fever, chills, headache, ear pain, wheezing, difficulty breathing, chest pain, abdominal pain, nausea, vomiting, or diarrhea.  She does report a facial rash since she was last seen.  Past Medical History:  Diagnosis Date   Allergy    medicine   Anemia    BV (bacterial vaginosis) 08/15/2014   CSF leak    Diverticulitis    Encounter for well woman exam with routine gynecological exam 12/25/2019   GERD (gastroesophageal reflux disease)    Headache(784.0)    Hypertension    Low back pain 08/15/2014   Lupus    Obesity    [place] spotted fever 04/2016   Ulcer    Urinary frequency 08/30/2014   Vaginal discharge 08/15/2014   Vaginal irritation 02/12/2014   Yeast infection 02/12/2014    Patient Active Problem List   Diagnosis Date Noted   Encounter for screening fecal occult blood testing 01/12/2023   IDA (iron deficiency anemia)  11/24/2022   Elevated lipase 11/11/2021   Bloating 11/11/2021   Fatty liver 01/22/2020   Autoimmune disorder (HCC) 01/22/2020   Encounter for well woman exam with routine gynecological exam 12/25/2019   Mass of upper inner quadrant of left breast 12/25/2019   Infection of skin and subcutaneous tissue due to fungus 12/25/2019   Yeast vaginitis 09/14/2019   Vaginal irritation 09/14/2019   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   Loose stools 12/07/2018   Overflow incontinence of urine 07/24/2018   Labial skin tag 07/24/2018   Absence of bladder continence 07/24/2018   Prediabetes 06/12/2018   Liver lesion 09/07/2017   Proteinuria 09/07/2017   Essential hypertension 09/01/2017   Migraine 06/21/2017   Chronic fatigue 06/21/2017   Obesity (BMI 30.0-34.9) 06/21/2017   GERD (gastroesophageal reflux disease) 12/22/2016   Uterine leiomyoma 12/02/2016   Menorrhagia with irregular cycle 12/02/2016   Normocytic anemia 09/11/2014   PUD (peptic ulcer disease) 06/06/2014   Vitamin D deficiency 02/19/2013   PTSD (post-traumatic stress disorder) 03/23/2012    Past Surgical History:  Procedure Laterality Date   CHOLECYSTECTOMY  Feb 2015   Dr. Marcha Solders, [facility]   COLONOSCOPY WITH PROPOFOL N/A 12/28/2021   Procedure: COLONOSCOPY WITH PROPOFOL;  Surgeon: Lanelle Bal, DO;  [facility];  Service: Endoscopy;  Laterality: N/A;  9:00am   ENDOSCOPIC CONCHA BULLOSA RESECTION Left 07/20/2016   Procedure: ENDOSCOPIC LEFT  CONCHA BULLOSA RESECTION;  Surgeon: Newman Pies, MD;  Location: [facility];  Service: ENT;  Laterality: Left;   ESOPHAGOGASTRODUODENOSCOPY N/A 06/11/2014   ZDG:UYQI DUODENITIS/MODERATE EROSIVE GASTRICTIS IN ANTRUM/PARTIALLY HEALED ULCERS   ESOPHAGOGASTRODUODENOSCOPY N/A 05/06/2015   Procedure: ESOPHAGOGASTRODUODENOSCOPY (EGD);  Surgeon: West Bali, MD;  [facility]: [facility];  Service: Endoscopy;  Laterality: N/A;   1300 - moved to 1:15 - office to notify   ESOPHAGOGASTRODUODENOSCOPY (EGD) WITH ESOPHAGEAL DILATION N/A 02/11/2014   Dr. Darrick Penna: probable proximal esophageal web, PUD, duodenitis, negative H.pylori    ESOPHAGOGASTRODUODENOSCOPY (EGD) WITH PROPOFOL N/A 12/28/2021   Procedure: ESOPHAGOGASTRODUODENOSCOPY (EGD) WITH PROPOFOL;  Surgeon: Lanelle Bal, DO;  [facility];  Service: Endoscopy;  Laterality: N/A;   EXCISION CHONCHA BULLOSA N/A 07/20/2016   Procedure: NASAL SEPTOPLASTY WITH BILATERAL TURBINATE REDUCTION;  Surgeon: Newman Pies, MD;  Location: [facility];  Service: ENT;  Laterality: N/A;   GIVENS CAPSULE STUDY N/A 12/15/2022   Procedure: GIVENS CAPSULE STUDY;  Surgeon: Lanelle Bal, DO;  [facility]: [facility];  Service: Endoscopy;  Laterality: N/A;  730am   RHINOPLASTY     SAVORY DILATION N/A 05/06/2015   Procedure: SAVORY DILATION;  Surgeon: West Bali, MD;  [facility]: [facility];  Service: Endoscopy;  Laterality: N/A;   TUBAL LIGATION      OB History     Gravida  2   Para  2   Term  1   Preterm  1   AB      Living  2      SAB      IAB      Ectopic      Multiple      Live Births  2            Home Medications    Prior to Admission medications   Medication Sig Start Date End Date Taking? Authorizing Provider  brompheniramine-pseudoephedrine-DM 30-2-10 MG/5ML syrup Take 5 mLs by mouth 4 (four) times daily as needed. 09/07/23  Yes Leath-Warren, Sadie Haber, NP  cetirizine (ZYRTEC) 10 MG tablet Take 1 tablet (10 mg total) by mouth daily. 09/07/23  Yes Leath-Warren, Sadie Haber, NP  acetaminophen (TYLENOL) 500 MG tablet Take 1,000 mg by mouth every 6 (six) hours as needed for mild pain or headache.    [provider]  chlorthalidone (HYGROTON) 25 MG tablet Take 1 tablet (25 mg total) by mouth daily. (NEEDS TO BE SEEN BEFORE NEXT REFILL) 02/23/21   Dettinger, Elige Radon, MD  cholecalciferol (VITAMIN D3) 25 MCG (1000 UNIT)  tablet Take 1,000 Units by mouth daily.    [provider]  cyanocobalamin (VITAMIN B12) 1000 MCG tablet Take 1 tablet by mouth daily. Patient not taking: Reported on 03/17/2023 10/15/22   [provider]  DEXILANT 60 MG capsule TAKE 1 CAPSULE BY MOUTH ONCE DAILY BEFORE BREAKFAST Patient not taking: Reported on 09/07/2023 07/12/23   Lanelle Bal, DO  famotidine (PEPCID) 20 MG tablet Take 1 tablet (20 mg total) by mouth 2 (two) times daily. 09/04/23   Elmer Picker, NP  ferrous sulfate 325 (65 FE) MG tablet Take 325 mg by mouth daily with breakfast. Patient not taking: Reported on 03/17/2023    [provider]  pantoprazole (PROTONIX) 20 MG tablet Take 1 tablet (20 mg total) by mouth daily. 09/04/23   Elmer Picker, NP  promethazine-dextromethorphan (PROMETHAZINE-DM)  6.25-15 MG/5ML syrup Take 5 mLs by mouth 4 (four) times daily as needed for cough. 09/04/23   Elmer Picker, NP    Family History Family History  Problem Relation Age of Onset   Depression Father    Anxiety disorder Father    Diabetes Father    Hypertension Father    Hyperlipidemia Father    Kidney disease Father    Mental illness Father    Pneumonia Father    Stroke Father    Depression Sister    Anxiety disorder Sister    Cancer Sister        cervical   Stroke Sister    Depression Brother    Anxiety disorder Brother    Cancer - Other Paternal Grandfather            Cancer Paternal Grandfather        prostate   Kidney disease Mother    Diabetes Mother    Cancer Mother        kidney   Hyperlipidemia Mother    Hypertension Mother    Stroke Mother    Aneurysm Maternal Grandfather    Cancer Maternal Grandmother 9       breast   Cancer Paternal Grandmother        bladder   ADD / ADHD Neg Hx    Alcohol abuse Neg Hx    Drug abuse Neg Hx    Bipolar disorder Neg Hx    Dementia Neg Hx    OCD Neg Hx    Paranoid behavior Neg Hx    Schizophrenia Neg Hx    Seizures Neg Hx    Sexual  abuse Neg Hx    Physical abuse Neg Hx    Colon cancer Neg Hx     Social History Social History   Tobacco Use   Smoking status: Never   Smokeless tobacco: Never  Vaping Use   Vaping status: Never Used  Substance Use Topics   Alcohol use: No    Alcohol/week: 0.0 standard drinks of alcohol   Drug use: No     Allergies   Macrobid [nitrofurantoin monohyd macro]   Review of Systems Review of Systems Per HPI  Physical Exam Triage Vital Signs ED Triage Vitals  Encounter Vitals Group     BP 09/07/23 0945 (!) 147/80     Systolic BP Percentile --      Diastolic BP Percentile --      Pulse Rate 09/07/23 0945 81     Resp 09/07/23 0945 20     Temp 09/07/23 0945 98.6 F (37 C)     Temp Source 09/07/23 0945 Oral     SpO2 09/07/23 0945 96 %     Weight --      Height --      Head Circumference --      Peak Flow --      Pain Score 09/07/23 0944 4     Pain Loc --      Pain Education --      Exclude from Growth Chart --    No data found.  Updated Vital Signs BP (!) 147/80 (BP Location: Right Arm)   Pulse 81   Temp 98.6 F (37 C) (Oral)   Resp 20   LMP 08/31/2023   SpO2 96%   Visual Acuity Right Eye Distance:   Left Eye Distance:   Bilateral Distance:    Right Eye Near:   Left Eye Near:    Bilateral Near:  Physical Exam Vitals and nursing note reviewed.  Constitutional:      General: She is not in acute distress.    Appearance: Normal appearance.  HENT:     Head: Normocephalic.     Right Ear: Tympanic membrane, ear canal and external ear normal.     Left Ear: Tympanic membrane, ear canal and external ear normal.     Nose: Nose normal.     Right Turbinates: Enlarged and swollen.     Left Turbinates: Enlarged and swollen.     Mouth/Throat:     Lips: Pink.     Mouth: Mucous membranes are moist.     Pharynx: Oropharynx is clear. Uvula midline. Postnasal drip present. No pharyngeal swelling, oropharyngeal exudate or posterior oropharyngeal erythema.   Eyes:     Extraocular Movements: Extraocular movements intact.     Conjunctiva/sclera: Conjunctivae normal.     Pupils: Pupils are equal, round, and reactive to light.  Cardiovascular:     Rate and Rhythm: Normal rate and regular rhythm.     Pulses: Normal pulses.     Heart sounds: Normal heart sounds.  Pulmonary:     Effort: Pulmonary effort is normal. No respiratory distress.     Breath sounds: Normal breath sounds. No stridor. No wheezing, rhonchi or rales.  Abdominal:     General: Bowel sounds are normal.     Palpations: Abdomen is soft.     Tenderness: There is no abdominal tenderness.  Musculoskeletal:     Cervical back: Normal range of motion.  Lymphadenopathy:     Cervical: No cervical adenopathy.  Skin:    General: Skin is warm and dry.     Findings: Rash (face) present.  Neurological:     General: No focal deficit present.     Mental Status: She is alert and oriented to person, place, and time.  Psychiatric:        Mood and Affect: Mood normal.        Behavior: Behavior normal.      UC Treatments / Results  Labs (all labs ordered are listed, but only abnormal results are displayed) Labs Reviewed - No data to display  EKG   Radiology No results found.  Procedures Procedures (including critical care time)  Medications Ordered in UC Medications - No data to display  Initial Impression / Assessment and Plan / UC Course  I have reviewed the triage vital signs and the nursing notes.  Pertinent labs & imaging results that were available during my care of the patient were reviewed by me and considered in my medical decision making (see chart for details).  Suspect patient's cough may be triggered by underlying perennial allergies and reflux symptoms.  Will start patient on Bromfed-DM and cetirizine 10 mg.  Patient advised to continue her current reflux medications to include pantoprazole and famotidine.  Supportive care recommendations were provided and  discussed with the patient to include over-the-counter analgesics, and use of a humidifier in her bedroom during sleep.  With regard to her facial rash, patient was given recommendations regarding skin cleansers while symptoms persist.  Patient was given strict follow-up precautions.  Patient is in agreement with this plan of care and verbalizes understanding.  All questions were answered.  Patient stable for discharge.  Final Clinical Impressions(s) / UC Diagnoses   Final diagnoses:  Cough, unspecified type  Facial rash     Discharge Instructions      Take medication as prescribed. As discussed, you may take the  cough medicine previously prescribed.  Do recommend that you take it at bedtime.  Recommend taking half the dose prescribed to see how you respond to the medication before taking the full prescribed dose. Increase fluids and allow for plenty of rest. May take over-the-counter Tylenol or ibuprofen as needed for pain, fever, or general discomfort. Recommend using a humidifier in your bedroom at nighttime during sleep or sleeping elevated on pillows while cough symptoms persist. Make sure the skin is clean and dry.  You may use over-the-counter skin cleansers such Cetaphil or CeraVe while symptoms persist. If symptoms continue to persist, you may follow-up in this clinic or with your primary care physician for further evaluation. Follow-up as needed.     ED Prescriptions     Medication Sig Dispense Auth. Provider   brompheniramine-pseudoephedrine-DM 30-2-10 MG/5ML syrup Take 5 mLs by mouth 4 (four) times daily as needed. 140 mL Leath-Warren, Sadie Haber, NP   cetirizine (ZYRTEC) 10 MG tablet Take 1 tablet (10 mg total) by mouth daily. 30 tablet Leath-Warren, Sadie Haber, NP      PDMP not reviewed this encounter.   Abran Cantor, NP 09/07/23 1019

## 2023-09-07 NOTE — Discharge Instructions (Addendum)
Take medication as prescribed. As discussed, you may take the cough medicine previously prescribed.  Do recommend that you take it at bedtime.  Recommend taking half the dose prescribed to see how you respond to the medication before taking the full prescribed dose. Increase fluids and allow for plenty of rest. May take over-the-counter Tylenol or ibuprofen as needed for pain, fever, or general discomfort. Recommend using a humidifier in your bedroom at nighttime during sleep or sleeping elevated on pillows while cough symptoms persist. Make sure the skin is clean and dry.  You may use over-the-counter skin cleansers such Cetaphil or CeraVe while symptoms persist. If symptoms continue to persist, you may follow-up in this clinic or with your primary care physician for further evaluation. Follow-up as needed.

## 2023-09-07 NOTE — ED Triage Notes (Addendum)
Pt reports was seen for cough a few days ago and reports cough remains, reports "facial rash" and intermittent hoarse voice since seen last.

## 2023-09-21 ENCOUNTER — Encounter: Payer: Self-pay | Admitting: Psychiatry

## 2023-09-28 DIAGNOSIS — L299 Pruritus, unspecified: Secondary | ICD-10-CM | POA: Diagnosis not present

## 2023-10-24 DIAGNOSIS — Z6841 Body Mass Index (BMI) 40.0 and over, adult: Secondary | ICD-10-CM | POA: Diagnosis not present

## 2023-10-24 DIAGNOSIS — E559 Vitamin D deficiency, unspecified: Secondary | ICD-10-CM | POA: Diagnosis not present

## 2023-10-24 DIAGNOSIS — E611 Iron deficiency: Secondary | ICD-10-CM | POA: Diagnosis not present

## 2023-10-24 DIAGNOSIS — I10 Essential (primary) hypertension: Secondary | ICD-10-CM | POA: Diagnosis not present

## 2023-10-24 DIAGNOSIS — Z13220 Encounter for screening for lipoid disorders: Secondary | ICD-10-CM | POA: Diagnosis not present

## 2023-10-24 DIAGNOSIS — R7303 Prediabetes: Secondary | ICD-10-CM | POA: Diagnosis not present

## 2023-10-24 DIAGNOSIS — R202 Paresthesia of skin: Secondary | ICD-10-CM | POA: Diagnosis not present

## 2023-10-24 DIAGNOSIS — E538 Deficiency of other specified B group vitamins: Secondary | ICD-10-CM | POA: Diagnosis not present

## 2023-10-24 DIAGNOSIS — Z0000 Encounter for general adult medical examination without abnormal findings: Secondary | ICD-10-CM | POA: Diagnosis not present

## 2023-10-24 DIAGNOSIS — E66813 Obesity, class 3: Secondary | ICD-10-CM | POA: Diagnosis not present

## 2023-10-24 DIAGNOSIS — R200 Anesthesia of skin: Secondary | ICD-10-CM | POA: Diagnosis not present

## 2023-10-28 ENCOUNTER — Other Ambulatory Visit (HOSPITAL_COMMUNITY): Payer: Self-pay | Admitting: Adult Health

## 2023-10-28 DIAGNOSIS — Z1231 Encounter for screening mammogram for malignant neoplasm of breast: Secondary | ICD-10-CM

## 2023-12-14 ENCOUNTER — Encounter (HOSPITAL_COMMUNITY): Payer: Self-pay

## 2023-12-14 ENCOUNTER — Ambulatory Visit (HOSPITAL_COMMUNITY)
Admission: RE | Admit: 2023-12-14 | Discharge: 2023-12-14 | Disposition: A | Discharge status: Home / Self Care | Payer: Medicare Other | Source: Ambulatory Visit | Attending: Adult Health | Admitting: Adult Health

## 2023-12-14 ENCOUNTER — Ambulatory Visit (HOSPITAL_COMMUNITY): Payer: Medicare Other

## 2023-12-14 DIAGNOSIS — Z1231 Encounter for screening mammogram for malignant neoplasm of breast: Secondary | ICD-10-CM | POA: Diagnosis not present

## 2024-01-16 DIAGNOSIS — R5383 Other fatigue: Secondary | ICD-10-CM | POA: Diagnosis not present

## 2024-01-16 DIAGNOSIS — Z6841 Body Mass Index (BMI) 40.0 and over, adult: Secondary | ICD-10-CM | POA: Diagnosis not present

## 2024-01-16 DIAGNOSIS — R002 Palpitations: Secondary | ICD-10-CM | POA: Diagnosis not present

## 2024-01-16 DIAGNOSIS — M7989 Other specified soft tissue disorders: Secondary | ICD-10-CM | POA: Diagnosis not present

## 2024-01-16 DIAGNOSIS — R601 Generalized edema: Secondary | ICD-10-CM | POA: Diagnosis not present

## 2024-01-16 DIAGNOSIS — E66813 Obesity, class 3: Secondary | ICD-10-CM | POA: Diagnosis not present

## 2024-01-23 DIAGNOSIS — R202 Paresthesia of skin: Secondary | ICD-10-CM | POA: Diagnosis not present

## 2024-01-23 DIAGNOSIS — M7989 Other specified soft tissue disorders: Secondary | ICD-10-CM | POA: Diagnosis not present

## 2024-01-23 DIAGNOSIS — R5383 Other fatigue: Secondary | ICD-10-CM | POA: Diagnosis not present

## 2024-01-27 DIAGNOSIS — M7989 Other specified soft tissue disorders: Secondary | ICD-10-CM | POA: Diagnosis not present

## 2024-01-27 DIAGNOSIS — R601 Generalized edema: Secondary | ICD-10-CM | POA: Diagnosis not present

## 2024-01-27 DIAGNOSIS — R002 Palpitations: Secondary | ICD-10-CM | POA: Diagnosis not present

## 2024-01-27 DIAGNOSIS — R5383 Other fatigue: Secondary | ICD-10-CM | POA: Diagnosis not present

## 2024-02-15 ENCOUNTER — Encounter: Payer: Self-pay | Admitting: Adult Health

## 2024-02-15 ENCOUNTER — Other Ambulatory Visit (HOSPITAL_COMMUNITY)
Admission: RE | Admit: 2024-02-15 | Discharge: 2024-02-15 | Disposition: A | Discharge status: Home / Self Care | Source: Ambulatory Visit | Attending: Adult Health | Admitting: Adult Health

## 2024-02-15 ENCOUNTER — Ambulatory Visit: Payer: Medicare Other | Admitting: Adult Health

## 2024-02-15 VITALS — BP 128/85 | HR 76 | Ht 60.0 in | Wt 231.5 lb

## 2024-02-15 DIAGNOSIS — Z01419 Encounter for gynecological examination (general) (routine) without abnormal findings: Secondary | ICD-10-CM | POA: Insufficient documentation

## 2024-02-15 DIAGNOSIS — Z1151 Encounter for screening for human papillomavirus (HPV): Secondary | ICD-10-CM | POA: Insufficient documentation

## 2024-02-15 DIAGNOSIS — Z1331 Encounter for screening for depression: Secondary | ICD-10-CM | POA: Diagnosis not present

## 2024-02-15 DIAGNOSIS — Z0000 Encounter for general adult medical examination without abnormal findings: Secondary | ICD-10-CM

## 2024-02-15 NOTE — Progress Notes (Signed)
 Patient ID: Teresa Tran, female   DOB: 11-11-1980, 43 y.o.   MRN: [id number] History of Present Illness: Teresa Tran is a 43 year old white female, married, G2P1102 in for a well woman gyn exam and pap. She says periods heavier than they used to be, but not bad, may change pads every 4-5 hours. She has joint aches due to having [facility] Spotted Fever.   PCP is Dr Rolley Sims    Current Medications, Allergies, Past Medical History, Past Surgical History, Family History and Social History were reviewed in [employer] electronic medical record.     Review of Systems: Patient denies any headaches, hearing loss, fatigue, blurred vision, shortness of breath, chest pain, abdominal pain, problems with bowel movements, urination, or intercourse. No mood swings.  See HPI For positives  Physical Exam:BP 128/85 (BP Location: Left Arm, Patient Position: Sitting, Cuff Size: Large)   Pulse 76   Ht 5' (1.524 m)   Wt 231 lb 8 oz (105 kg)   LMP 01/23/2024   BMI 45.21 kg/m   General:  Well developed, well nourished, no acute distress Skin:  Warm and dry Neck:  Midline trachea, normal thyroid, good ROM, no lymphadenopathy, has several skin tags around neck  Lungs; Clear to auscultation bilaterally Breast:  No dominant palpable mass, retraction, or nipple discharge Cardiovascular: Regular rate and rhythm Abdomen:  Soft, non tender, no hepatosplenomegaly Pelvic:  External genitalia is normal in appearance, no lesions.  The vagina is normal in appearance. Urethra has no lesions or masses. The cervix is smooth, pap with HR HPV genotyping performed.  Uterus is felt to be normal size, shape, and contour.  No adnexal masses or tenderness noted.Bladder is non tender, no masses felt. Rectal: Good sphincter tone, no polyps, or hemorrhoids felt.  Hemoccult negative. Extremities/musculoskeletal:  No swelling or varicosities noted, no clubbing or cyanosis Psych:  No mood changes, alert and cooperative,seems happy AA is  0 Fall risk is low    02/15/2024    8:41 AM 01/12/2023    8:47 AM 10/06/2021    2:54 PM  Depression screen PHQ 2/9  Decreased Interest 0 0 0  Down, Depressed, Hopeless 0 0 0  PHQ - 2 Score 0 0 0  Altered sleeping 0 0 0  Tired, decreased energy 0 1 0  Change in appetite 0 0 0  Feeling bad or failure about yourself  0 0 0  Trouble concentrating 0 0 0  Moving slowly or fidgety/restless 0 0 0  Suicidal thoughts 0 0 0  PHQ-9 Score 0 1 0       02/15/2024    8:42 AM 01/12/2023    8:47 AM 10/06/2021    2:54 PM 12/17/2019   10:53 AM  GAD 7 : Generalized Anxiety Score  Nervous, Anxious, on Edge 0 0 0 0  Control/stop worrying 0 0 0 0  Worry too much - different things 0 0 0 0  Trouble relaxing 0 0 0 0  Restless 0 0 0 0  Easily annoyed or irritable 0 0 0 0  Afraid - awful might happen 0 0 0 0  Total GAD 7 Score 0 0 0 0      Upstream - 02/15/24 0836       Pregnancy Intention Screening   Does the patient want to become pregnant in the next year? No    Does the patient's partner want to become pregnant in the next year? No    Would the patient like to  discuss contraceptive options today? No      Contraception Wrap Up   Current Method Female Sterilization    End Method Female Sterilization    Contraception Counseling Provided No            Examination chaperoned by Faith Rogue LPN  Impression and plan: 1. Encounter for gynecological examination with Papanicolaou smear of cervix (Primary) Pap sent Pap in 3 years if normal Physical in 1 year Mammogram was negative  12/14/23 Labs with PCP  - Cytology - PAP( [place])

## 2024-02-16 LAB — CYTOLOGY - PAP
Adequacy: ABSENT
Comment: NEGATIVE
Diagnosis: NEGATIVE
High risk HPV: NEGATIVE

## 2024-03-07 DIAGNOSIS — H18522 Epithelial (juvenile) corneal dystrophy, left eye: Secondary | ICD-10-CM | POA: Diagnosis not present

## 2024-08-16 DIAGNOSIS — Z23 Encounter for immunization: Secondary | ICD-10-CM | POA: Diagnosis not present

## 2024-08-16 DIAGNOSIS — L918 Other hypertrophic disorders of the skin: Secondary | ICD-10-CM | POA: Diagnosis not present

## 2024-08-16 DIAGNOSIS — D225 Melanocytic nevi of trunk: Secondary | ICD-10-CM | POA: Diagnosis not present

## 2024-08-16 DIAGNOSIS — L308 Other specified dermatitis: Secondary | ICD-10-CM | POA: Diagnosis not present

## 2024-09-02 NOTE — Progress Notes (Unsigned)
 GI Office Note    Referring Provider: Wilhelm Duwaine Senter, * Primary Care Physician:  Wilhelm Duwaine Senter, MD (Inactive) Primary Gastroenterologist: Carlin POUR. Cindie, DO  Date:  09/03/2024  ID:  Teresa Tran, DOB [birth year], MRN [id number]  Chief Complaint   Chief Complaint  Patient presents with   Follow-up    Follow up. Weight loss and sometimes constipation    History of Present Illness  Teresa Tran is a 43 y.o. female with a history of fatty liver, GERD, autoimmune disorders, microcytic anemia presenting today with complaint of constipation and weight loss.   January 2023 with negative celiac screen.   Colonoscopy February 2023: -Nonbleeding internal hemorrhoids -Next colonoscopy 10 years  EGD February 2023: -Gastritis -no biopsy  Abdominal ultrasound October 2021: IMPRESSION: 1. Status post cholecystectomy. No intra or extrahepatic biliary dilatation. 2. Diffuse fatty infiltration of the liver but no focal hepatic lesions. 3. Limited visualization the pancreas. 4. The spleen and kidneys are unremarkable.  H. Pylori stool antigen negative 12/17/2022.  Capsule 12/15/22: reached hte cecum, small polypoid lesion in the stomach, unremarkable small bowel. Advised to continue iron, limit NSAIDs, check labs in 2 months.  Last office visit 03/17/2023 with Dr. Cindie. Seen for chronic reflux, follow up of IDA. Having lots of fecal urgency, bloating, and abdominal pain. Experiences diarrhea with milk and other dairy products. Most days has 2 Bms, lower back pain after meals that sometimes last for couple hours.  Advised CT A/P, low FODMAP diet. Gas-x as needed. Continue to monitor for IDA. Recommended weight loss and fatty liver diet. Continue dexilant  for reflux.   CT A/P 04/06/23 IMPRESSION: -No acute abnormality in the abdomen or pelvis. Specifically, normal appendix. -Submucosal fibrofatty infiltration of the ascending colon is similar back to 2017 and may reflect sequela  of chronic inflammation. -Uterine leiomyoma. -cholecystectomy  Today:  Discussed the use of AI scribe software for clinical note transcription with the patient, who gave verbal consent to proceed.  She has intentionally lost about 60 pounds since May through diet and exercise, focusing on increased protein intake without increasing fiber. She aims to lose an additional 23 pounds, though the rate of weight loss has slowed.  She experiences occasional constipation, described as occurring in waves, with bowel movements that are less frequent and require more effort. Some days she has daily bowel movements, while on others, she occurs every other day. Her bowel movements are smaller, and she does not feel completely emptied. She has had two episodes of significant discomfort where she used a laxative, but otherwise has not taken any regular medication for constipation. She drinks about four bottles of water  a day.  No blood in stool, significant abdominal pain, visible abdominal distention, bloating, nausea, vomiting, trouble swallowing, brbpr or black stool. Her past medical history includes fatty liver and anemia. She has not had recent liver enzyme testing or imaging for her liver since last year. Her reflux symptoms have improved with weight loss, and she is no longer taking Dexilant  due to insurance coverage issues. She is also not taking chlorthalidone  anymore. No significant chest pain, shortness of breath, or syncope.      Wt Readings from Last 6 Encounters:  09/03/24 171 lb 12.8 oz (77.9 kg)  02/15/24 231 lb 8 oz (105 kg)  03/17/23 217 lb 6.4 oz (98.6 kg)  01/12/23 218 lb (98.9 kg)  11/24/22 210 lb 3.2 oz (95.3 kg)  12/24/21 205 lb (93 kg)    Body mass index  is 33.55 kg/m.   Current Outpatient Medications  Medication Sig Dispense Refill   acetaminophen  (TYLENOL ) 500 MG tablet Take 1,000 mg by mouth every 6 (six) hours as needed for mild pain or headache.     chlorthalidone   (HYGROTON ) 25 MG tablet Take 1 tablet (25 mg total) by mouth daily. (NEEDS TO BE SEEN BEFORE NEXT REFILL) (Patient not taking: Reported on 09/03/2024) 30 tablet 0   cholecalciferol (VITAMIN D3) 25 MCG (1000 UNIT) tablet Take 1,000 Units by mouth daily. (Patient not taking: Reported on 09/03/2024)     DEXILANT  60 MG capsule TAKE 1 CAPSULE BY MOUTH ONCE DAILY BEFORE BREAKFAST (Patient not taking: Reported on 09/03/2024) 30 capsule 11   No current facility-administered medications for this visit.    Past Medical History:  Diagnosis Date   Allergy    medicine   Anemia    BV (bacterial vaginosis) 08/15/2014   CSF leak    Diverticulitis    Encounter for well woman exam with routine gynecological exam 12/25/2019   GERD (gastroesophageal reflux disease)    Headache(784.0)    Hypertension    Low back pain 08/15/2014   Lupus    Obesity    [facility] spotted fever 04/2016   Ulcer    Urinary frequency 08/30/2014   Vaginal discharge 08/15/2014   Vaginal irritation 02/12/2014   Yeast infection 02/12/2014    Past Surgical History:  Procedure Laterality Date   CHOLECYSTECTOMY  Feb 2015   Dr. Maranda, [facility]   COLONOSCOPY WITH PROPOFOL  N/A 12/28/2021   Procedure: COLONOSCOPY WITH PROPOFOL ;  Surgeon: Cindie Carlin POUR, DO;  [facility];  Service: Endoscopy;  Laterality: N/A;  9:00am   ENDOSCOPIC CONCHA BULLOSA RESECTION Left 07/20/2016   Procedure: ENDOSCOPIC LEFT  CONCHA BULLOSA RESECTION;  Surgeon: Daniel Moccasin, MD;  Location: [facility];  Service: ENT;  Laterality: Left;   ESOPHAGOGASTRODUODENOSCOPY N/A 06/11/2014   DOQ:FPOI DUODENITIS/MODERATE EROSIVE GASTRICTIS IN ANTRUM/PARTIALLY HEALED ULCERS   ESOPHAGOGASTRODUODENOSCOPY N/A 05/06/2015   Procedure: ESOPHAGOGASTRODUODENOSCOPY (EGD);  Surgeon: Margo LITTIE Haddock, MD;  [facility]: [facility];  Service: Endoscopy;  Laterality: N/A;  1300 - moved to 1:15 - office to notify   ESOPHAGOGASTRODUODENOSCOPY (EGD) WITH  ESOPHAGEAL DILATION N/A 02/11/2014   Dr. 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, PUD, duodenitis, negative H.pylori    ESOPHAGOGASTRODUODENOSCOPY (EGD) WITH PROPOFOL  N/A 12/28/2021   Procedure: ESOPHAGOGASTRODUODENOSCOPY (EGD) WITH PROPOFOL ;  Surgeon: Cindie Carlin POUR, DO;  [facility];  Service: Endoscopy;  Laterality: N/A;   EXCISION CHONCHA BULLOSA N/A 07/20/2016   Procedure: NASAL SEPTOPLASTY WITH BILATERAL TURBINATE REDUCTION;  Surgeon: Daniel Moccasin, MD;  Location: [facility];  Service: ENT;  Laterality: N/A;   GIVENS CAPSULE STUDY N/A 12/15/2022   Procedure: GIVENS CAPSULE STUDY;  Surgeon: Cindie Carlin POUR, DO;  [facility];  Service: Endoscopy;  Laterality: N/A;  730am   RHINOPLASTY     SAVORY DILATION N/A 05/06/2015   Procedure: SAVORY DILATION;  Surgeon: Margo LITTIE Haddock, MD;  [facility];  Service: Endoscopy;  Laterality: N/A;   TUBAL LIGATION      Family History  Problem Relation Age of Onset   Kidney disease Mother    Diabetes Mother    Cancer Mother        kidney   Hyperlipidemia Mother    Hypertension Mother    Stroke Mother    Depression Father    Anxiety disorder Father    Diabetes Father  Hypertension Father    Hyperlipidemia Father    Kidney disease Father    Mental illness Father    Pneumonia Father    Stroke Father    Depression Sister    Anxiety disorder Sister    Cancer Sister        cervical   Stroke Sister    Breast cancer Maternal Grandmother    Cancer Maternal Grandmother 34       breast   Aneurysm Maternal Grandfather    Cancer Paternal Grandmother        bladder   Cancer - Other Paternal Grandfather            Cancer Paternal Grandfather        prostate   Depression Brother    Anxiety disorder Brother    ADD / ADHD Neg Hx    Alcohol abuse Neg Hx    Drug abuse Neg Hx    Bipolar disorder Neg Hx    Dementia Neg Hx    OCD Neg Hx    Paranoid behavior Neg Hx    Schizophrenia Neg Hx    Seizures  Neg Hx    Sexual abuse Neg Hx    Physical abuse Neg Hx    Colon cancer Neg Hx     Allergies as of 09/03/2024 - Review Complete 09/03/2024  Allergen Reaction Noted   Macrobid [nitrofurantoin monohyd macro] Itching 01/09/2014    Social History   Socioeconomic History   Marital status: Married    Spouse name: Vicenta   Number of children: 2   Years of education: 16   Highest education level: Bachelor's degree (e.g., BA, AB, BS)  Occupational History   Occupation: homehealth  Tobacco Use   Smoking status: Never   Smokeless tobacco: Never  Vaping Use   Vaping status: Never Used  Substance and Sexual Activity   Alcohol use: No    Alcohol/week: 0.0 standard drinks of alcohol   Drug use: No   Sexual activity: Yes    Birth control/protection: Surgical    Comment: tubal  Other Topics Concern   Not on file  Social History Narrative   Working on scientist, water quality in radio broadcast assistant   Lives with husband and 2 children   One cat   Active in church   Social Drivers of Health   Financial Resource Strain: Low Risk  (02/15/2024)   Overall Financial Resource Strain (CARDIA)    Difficulty of Paying Living Expenses: Not very hard  Food Insecurity: No Food Insecurity (02/15/2024)   Hunger Vital Sign    Worried About Running Out of Food in the Last Year: Never true    Ran Out of Food in the Last Year: Never true  Transportation Needs: No Transportation Needs (02/15/2024)   PRAPARE - Administrator, Civil Service (Medical): No    Lack of Transportation (Non-Medical): No  Physical Activity: Insufficiently Active (02/15/2024)   Exercise Vital Sign    Days of Exercise per Week: 1 day    Minutes of Exercise per Session: 30 min  Stress: No Stress Concern Present (02/15/2024)   [employer] of Occupational Health - Occupational Stress Questionnaire    Feeling of Stress : Not at all  Social Connections: Socially Integrated (02/15/2024)   Social Connection and Isolation Panel     Frequency of Communication with Friends and Family: Twice a week    Frequency of Social Gatherings with Friends and Family: Twice a week    Attends Religious Services: More  than 4 times per year    Active Member of Clubs or Organizations: Yes    Attends Banker Meetings: More than 4 times per year    Marital Status: Married    Review of Systems   Gen: Denies fever, chills, anorexia. Denies fatigue, weakness, weight loss.  CV: Denies chest pain, palpitations, syncope, peripheral edema, and claudication. Resp: Denies dyspnea at rest, cough, wheezing, coughing up blood, and pleurisy. GI: See HPI Derm: Denies rash, itching, dry skin Psych: Denies depression, anxiety, memory loss, confusion. No homicidal or suicidal ideation.  Heme: Denies bruising, bleeding, and enlarged lymph nodes.  Physical Exam   BP 126/80 (BP Location: Right Arm, Patient Position: Sitting, Cuff Size: Normal)   Pulse 73   Temp 97.8 F (36.6 C) (Temporal)   Ht 5' (1.524 m)   Wt 171 lb 12.8 oz (77.9 kg)   LMP 09/02/2024 (Approximate)   BMI 33.55 kg/m   General:   Alert and oriented. No distress noted. Pleasant and cooperative.  Head:  Normocephalic and atraumatic. Eyes:  Conjuctiva clear without scleral icterus. Mouth:  Oral mucosa pink and moist. Good dentition. No lesions. Abdomen:  +BS, soft, non-tender and non-distended. No rebound or guarding. No HSM or masses noted. Rectal: deferred Msk:  Symmetrical without gross deformities. Normal posture. Extremities:  Without edema. Neurologic:  Alert and  oriented x4 Psych:  Alert and cooperative. Normal mood and affect.  Assessment & Plan  Jataya MARQUITA LIAS is a 43 y.o. female presenting today with constipation.      Constipation Intermittent constipation associated with increased protein intake during weight loss. Bowel movements are infrequent and require straining, without hematochezia or significant abdominal pain. No abdominal distention. Increased  water  intake, but low fiber intake. Previous severe constipation episodes relieved by laxatives. No current use of over-the-counter medications. - Initiate fiber supplementation with psyllium (Metamucil) in powder or capsule form. Start with 2-3 capsules once daily or 1 tablespoon of powder in 6-8 ounces of water , increasing as tolerated. - Educate on potential initial side effects of fiber supplementation, including increased gas, and advise gradual dosage increase. - Encourage continued hydration and consider increasing water  intake.  History of fatty liver disease No significant findings on last year's imaging. No recent liver enzyme testing or imaging since March. US  in 2021 with evidence of hepatic steatosis. Following more healthy diet and recent weight loss.  - Order liver function tests and baseline fibrosis labs.  - If fibrosis number is high or indeterminate, consider ordering a repeat ultrasound.  History of microcytic anemia No recent blood work since March. No alarm symptoms present. Previous complete workup in 2023 with EGD, colonoscopy, and capsule study.  - Order CBC and anemia panel for monitoring - including B12 and folate.   GERD Well controlled with diet and exercise currently. Previous use of dexilant .  - Continue to monitor and follow healthy diet.      Follow up   Follow up with LSL or Dr. Cindie (primary GI care team) in 3 months.    Charmaine Melia, MSN, FNP-BC, AGACNP-BC [facility]

## 2024-09-03 ENCOUNTER — Ambulatory Visit (INDEPENDENT_AMBULATORY_CARE_PROVIDER_SITE_OTHER): Admitting: Gastroenterology

## 2024-09-03 ENCOUNTER — Encounter: Payer: Self-pay | Admitting: Gastroenterology

## 2024-09-03 VITALS — BP 126/80 | HR 73 | Temp 97.8°F | Ht 60.0 in | Wt 171.8 lb

## 2024-09-03 DIAGNOSIS — K219 Gastro-esophageal reflux disease without esophagitis: Secondary | ICD-10-CM | POA: Diagnosis not present

## 2024-09-03 DIAGNOSIS — Z862 Personal history of diseases of the blood and blood-forming organs and certain disorders involving the immune mechanism: Secondary | ICD-10-CM | POA: Diagnosis not present

## 2024-09-03 DIAGNOSIS — K5900 Constipation, unspecified: Secondary | ICD-10-CM

## 2024-09-03 DIAGNOSIS — K760 Fatty (change of) liver, not elsewhere classified: Secondary | ICD-10-CM | POA: Diagnosis not present

## 2024-09-03 NOTE — Patient Instructions (Addendum)
 Please have labs completed at [facility].      If you take the powder you should take 1 tablespoon once daily in at least 8 oz of water .  Generic Metamucil (psyllium) is sufficient.  Please make your goal to be at least 60 to 80 ounces of water  daily.  Continue regular exercise.  We will follow-up in 3 months to assess how you are doing with your constipation at that time.  Please feel free to reach out the meantime if you have any questions or concerns.  It was a pleasure to see you today. I want to create trusting relationships with patients. If you receive a survey regarding your visit,  I greatly appreciate you taking time to fill this out on paper or through your MyChart. I value your feedback.  Charmaine Melia, MSN, FNP-BC, AGACNP-BC [facility]

## 2024-09-06 ENCOUNTER — Telehealth: Payer: Self-pay | Admitting: *Deleted

## 2024-09-06 NOTE — Telephone Encounter (Signed)
 Pt called and seen the results of labs in MyChart and would like someone to explain the results and what is the next step. Please advise.

## 2024-09-07 LAB — CBC
HCT: 38 % (ref 35.0–45.0)
Hemoglobin: 12 g/dL (ref 11.7–15.5)
MCH: 27.3 pg (ref 27.0–33.0)
MCHC: 31.6 g/dL — ABNORMAL LOW (ref 32.0–36.0)
MCV: 86.4 fL (ref 80.0–100.0)
MPV: 11.6 fL (ref 7.5–12.5)
Platelets: 182 Thousand/uL (ref 140–400)
RBC: 4.4 Million/uL (ref 3.80–5.10)
RDW: 13.6 % (ref 11.0–15.0)
WBC: 5.2 Thousand/uL (ref 3.8–10.8)

## 2024-09-07 LAB — IRON,TIBC AND FERRITIN PANEL
%SAT: 12 % — ABNORMAL LOW (ref 16–45)
Ferritin: 19 ng/mL (ref 16–232)
Iron: 34 ug/dL — ABNORMAL LOW (ref 40–190)
TIBC: 289 ug/dL (ref 250–450)

## 2024-09-07 LAB — LIVER FIBROSIS, FIBROTEST-ACTITEST
ALT: 9 U/L (ref 6–29)
Alpha-2-Macroglobulin: 120 mg/dL (ref 106–279)
Apolipoprotein A1: 136 mg/dL (ref 101–198)
Bilirubin: 0.3 mg/dL (ref 0.2–1.2)
Fibrosis Score: 0.02
GGT: 7 U/L (ref 3–55)
Haptoglobin: 191 mg/dL (ref 43–212)
Necroinflammat ACT Score: 0.01
Reference ID: 5769443

## 2024-09-07 LAB — COMPREHENSIVE METABOLIC PANEL WITH GFR
AG Ratio: 1.5 (calc) (ref 1.0–2.5)
ALT: 11 U/L (ref 6–29)
AST: 13 U/L (ref 10–30)
Albumin: 4 g/dL (ref 3.6–5.1)
Alkaline phosphatase (APISO): 62 U/L (ref 31–125)
BUN: 12 mg/dL (ref 7–25)
CO2: 27 mmol/L (ref 20–32)
Calcium: 9.2 mg/dL (ref 8.6–10.2)
Chloride: 106 mmol/L (ref 98–110)
Creat: 0.57 mg/dL (ref 0.50–0.99)
Globulin: 2.6 g/dL (ref 1.9–3.7)
Glucose, Bld: 98 mg/dL (ref 65–99)
Potassium: 4.2 mmol/L (ref 3.5–5.3)
Sodium: 139 mmol/L (ref 135–146)
Total Bilirubin: 0.3 mg/dL (ref 0.2–1.2)
Total Protein: 6.6 g/dL (ref 6.1–8.1)
eGFR: 116 mL/min/1.73m2 (ref >=60)

## 2024-09-07 LAB — FIB-4 INDEX PANEL WITH REFLEX TO ENHANCED LIVER FIBROSIS (ELF) SCORE
ALT: 11 U/L (ref 6–29)
AST: 13 U/L (ref 10–30)
FIB 4 INDEX: 0.9
Platelets: 182 Thousand/uL (ref 140–400)

## 2024-09-07 LAB — PROTIME-INR
INR: 1
Prothrombin Time: 10.3 s (ref 9.0–11.5)

## 2024-09-07 LAB — B12 AND FOLATE PANEL
Folate: 10.8 ng/mL
Vitamin B-12: 238 pg/mL (ref 200–1100)

## 2024-09-07 NOTE — Telephone Encounter (Signed)
 Noted

## 2024-09-08 ENCOUNTER — Ambulatory Visit: Payer: Self-pay | Admitting: Gastroenterology

## 2024-10-18 DIAGNOSIS — R490 Dysphonia: Secondary | ICD-10-CM | POA: Diagnosis not present

## 2024-10-18 DIAGNOSIS — R7303 Prediabetes: Secondary | ICD-10-CM | POA: Diagnosis not present

## 2024-10-18 DIAGNOSIS — M25552 Pain in left hip: Secondary | ICD-10-CM | POA: Diagnosis not present

## 2024-10-18 DIAGNOSIS — M25562 Pain in left knee: Secondary | ICD-10-CM | POA: Diagnosis not present

## 2024-10-18 DIAGNOSIS — G8929 Other chronic pain: Secondary | ICD-10-CM | POA: Diagnosis not present

## 2024-10-18 DIAGNOSIS — E782 Mixed hyperlipidemia: Secondary | ICD-10-CM | POA: Diagnosis not present

## 2024-11-08 ENCOUNTER — Encounter: Payer: Self-pay | Admitting: Gastroenterology

## 2024-11-09 ENCOUNTER — Telehealth: Payer: Self-pay | Admitting: Gastroenterology

## 2024-11-09 DIAGNOSIS — D509 Iron deficiency anemia, unspecified: Secondary | ICD-10-CM

## 2024-11-09 DIAGNOSIS — E538 Deficiency of other specified B group vitamins: Secondary | ICD-10-CM

## 2024-11-09 NOTE — Telephone Encounter (Signed)
 Given her mild iron deficiency and B12  being low normal on labs in late October I recommend rechecking her CBC, iron panel, and B12 level. Please let patient know to complete these labs. Orders are in for [facility].  If she would like she can come by the office and pick up the lab slips or they should be available electronically for her.  Charmaine Melia, MSN, APRN, FNP-BC, AGACNP-BC [facility]

## 2024-11-12 ENCOUNTER — Other Ambulatory Visit (HOSPITAL_COMMUNITY): Payer: Self-pay | Admitting: Adult Health

## 2024-11-12 DIAGNOSIS — D649 Anemia, unspecified: Secondary | ICD-10-CM

## 2024-11-12 DIAGNOSIS — D509 Iron deficiency anemia, unspecified: Secondary | ICD-10-CM

## 2024-11-12 DIAGNOSIS — E538 Deficiency of other specified B group vitamins: Secondary | ICD-10-CM

## 2024-11-12 DIAGNOSIS — Z1231 Encounter for screening mammogram for malignant neoplasm of breast: Secondary | ICD-10-CM

## 2024-11-12 NOTE — Telephone Encounter (Signed)
 Noted! Thank you

## 2024-11-12 NOTE — Telephone Encounter (Signed)
 Spoke to pt, informed her of recommendations. Pt voiced understanding. Pt would like to go to [facility] and have labs done.

## 2024-11-13 LAB — CBC
HCT: 36.2 % (ref 35.9–46.0)
Hemoglobin: 11.5 g/dL — ABNORMAL LOW (ref 11.7–15.5)
MCH: 27.1 pg (ref 27.0–33.0)
MCHC: 31.8 g/dL (ref 31.6–35.4)
MCV: 85.4 fL (ref 81.4–101.7)
MPV: 11.5 fL (ref 7.5–12.5)
Platelets: 172 Thousand/uL (ref 140–400)
RBC: 4.24 Million/uL (ref 3.80–5.10)
RDW: 13 % (ref 11.0–15.0)
WBC: 6.5 Thousand/uL (ref 3.8–10.8)

## 2024-11-13 LAB — IRON,TIBC AND FERRITIN PANEL
%SAT: 14 % — ABNORMAL LOW (ref 16–45)
Ferritin: 13 ng/mL — ABNORMAL LOW (ref 16–232)
Iron: 40 ug/dL (ref 40–190)
TIBC: 285 ug/dL (ref 250–450)

## 2024-11-13 LAB — B12 AND FOLATE PANEL
Folate: 8.6 ng/mL
Vitamin B-12: 413 pg/mL (ref 200–1100)

## 2024-11-25 ENCOUNTER — Ambulatory Visit: Payer: Self-pay | Admitting: Gastroenterology

## 2024-12-07 ENCOUNTER — Telehealth: Payer: Self-pay

## 2024-12-07 NOTE — Telephone Encounter (Signed)
"   I have not seen her in over two years. Charmaine saw patient in 08/2024. Agree with advise provided.  "

## 2024-12-07 NOTE — Telephone Encounter (Signed)
 Pt called stating that due to the weather and the office opening late on Monday she had to reschedule her appointment that she had with Sonny. Her appointment was rescheduled to March 25th and she is wanting to know if she needs to do anything in the meantime. Pt states that she was coming in to discuss the possible need for iv infusions and she is wanting to know if anything needs to be done before March. Please advise.

## 2024-12-07 NOTE — Telephone Encounter (Signed)
 Pt was made aware and verbalized understanding. Pt stated that she would call back if she decided to see you instead of Leslie.

## 2024-12-10 ENCOUNTER — Ambulatory Visit: Admitting: Gastroenterology

## 2024-12-14 ENCOUNTER — Ambulatory Visit (HOSPITAL_COMMUNITY)

## 2024-12-24 ENCOUNTER — Ambulatory Visit (HOSPITAL_COMMUNITY)

## 2025-01-30 ENCOUNTER — Ambulatory Visit: Admitting: Gastroenterology

## 2025-02-18 ENCOUNTER — Ambulatory Visit: Admitting: Adult Health
